# Patient Record
Sex: Female | Born: 1950
Health system: Southern US, Community
[De-identification: ages and names within clinical notes are randomized; demographics above are authoritative.]

## PROBLEM LIST (undated history)

## (undated) DIAGNOSIS — H269 Unspecified cataract: Secondary | ICD-10-CM

## (undated) DIAGNOSIS — G709 Myoneural disorder, unspecified: Secondary | ICD-10-CM

## (undated) DIAGNOSIS — R011 Cardiac murmur, unspecified: Secondary | ICD-10-CM

## (undated) DIAGNOSIS — I10 Essential (primary) hypertension: Secondary | ICD-10-CM

## (undated) DIAGNOSIS — F419 Anxiety disorder, unspecified: Secondary | ICD-10-CM

## (undated) DIAGNOSIS — F32A Depression, unspecified: Secondary | ICD-10-CM

## (undated) DIAGNOSIS — C801 Malignant (primary) neoplasm, unspecified: Secondary | ICD-10-CM

## (undated) DIAGNOSIS — C84A0 Cutaneous T-cell lymphoma, unspecified, unspecified site: Secondary | ICD-10-CM

## (undated) DIAGNOSIS — J45909 Unspecified asthma, uncomplicated: Secondary | ICD-10-CM

## (undated) DIAGNOSIS — M1990 Unspecified osteoarthritis, unspecified site: Secondary | ICD-10-CM

## (undated) DIAGNOSIS — T7840XA Allergy, unspecified, initial encounter: Secondary | ICD-10-CM

## (undated) DIAGNOSIS — F329 Major depressive disorder, single episode, unspecified: Secondary | ICD-10-CM

## (undated) DIAGNOSIS — E785 Hyperlipidemia, unspecified: Secondary | ICD-10-CM

## (undated) DIAGNOSIS — I4891 Unspecified atrial fibrillation: Secondary | ICD-10-CM

## (undated) DIAGNOSIS — E041 Nontoxic single thyroid nodule: Secondary | ICD-10-CM

## (undated) DIAGNOSIS — N390 Urinary tract infection, site not specified: Secondary | ICD-10-CM

## (undated) DIAGNOSIS — K219 Gastro-esophageal reflux disease without esophagitis: Secondary | ICD-10-CM

## (undated) HISTORY — DX: Cutaneous T-cell lymphoma, unspecified, unspecified site: C84.A0

## (undated) HISTORY — DX: Unspecified cataract: H26.9

## (undated) HISTORY — DX: Malignant (primary) neoplasm, unspecified: C80.1

## (undated) HISTORY — DX: Allergy, unspecified, initial encounter: T78.40XA

## (undated) HISTORY — DX: Cardiac murmur, unspecified: R01.1

## (undated) HISTORY — DX: Unspecified atrial fibrillation: I48.91

## (undated) HISTORY — DX: Unspecified asthma, uncomplicated: J45.909

## (undated) HISTORY — DX: Hyperlipidemia, unspecified: E78.5

## (undated) HISTORY — PX: OTHER SURGICAL HISTORY: SHX169

## (undated) HISTORY — DX: Myoneural disorder, unspecified: G70.9

## (undated) HISTORY — DX: Gastro-esophageal reflux disease without esophagitis: K21.9

## (undated) HISTORY — DX: Essential (primary) hypertension: I10

## (undated) HISTORY — DX: Nontoxic single thyroid nodule: E04.1

## (undated) HISTORY — DX: Major depressive disorder, single episode, unspecified: F32.9

## (undated) HISTORY — DX: Unspecified osteoarthritis, unspecified site: M19.90

## (undated) HISTORY — DX: Depression, unspecified: F32.A

## (undated) HISTORY — DX: Urinary tract infection, site not specified: N39.0

## (undated) HISTORY — DX: Anxiety disorder, unspecified: F41.9

---

## 1977-06-07 HISTORY — PX: OTHER SURGICAL HISTORY: SHX169

## 2009-06-07 DIAGNOSIS — C84A0 Cutaneous T-cell lymphoma, unspecified, unspecified site: Secondary | ICD-10-CM

## 2009-06-07 HISTORY — DX: Cutaneous T-cell lymphoma, unspecified, unspecified site: C84.A0

## 2013-06-07 HISTORY — PX: CHOLECYSTECTOMY: SHX55

## 2014-05-21 ENCOUNTER — Ambulatory Visit (INDEPENDENT_AMBULATORY_CARE_PROVIDER_SITE_OTHER): Payer: Medicare Other | Admitting: Family

## 2014-05-21 ENCOUNTER — Ambulatory Visit (INDEPENDENT_AMBULATORY_CARE_PROVIDER_SITE_OTHER): Payer: Medicare Other

## 2014-05-21 ENCOUNTER — Encounter: Payer: Self-pay | Admitting: Family

## 2014-05-21 VITALS — BP 150/98 | HR 61 | Temp 97.8°F | Resp 18 | Ht 67.0 in | Wt 178.8 lb

## 2014-05-21 DIAGNOSIS — F418 Other specified anxiety disorders: Secondary | ICD-10-CM

## 2014-05-21 DIAGNOSIS — J45909 Unspecified asthma, uncomplicated: Secondary | ICD-10-CM | POA: Insufficient documentation

## 2014-05-21 DIAGNOSIS — Z23 Encounter for immunization: Secondary | ICD-10-CM

## 2014-05-21 DIAGNOSIS — J4520 Mild intermittent asthma, uncomplicated: Secondary | ICD-10-CM

## 2014-05-21 DIAGNOSIS — I10 Essential (primary) hypertension: Secondary | ICD-10-CM

## 2014-05-21 DIAGNOSIS — F329 Major depressive disorder, single episode, unspecified: Secondary | ICD-10-CM | POA: Insufficient documentation

## 2014-05-21 DIAGNOSIS — F419 Anxiety disorder, unspecified: Secondary | ICD-10-CM

## 2014-05-21 MED ORDER — LISINOPRIL 20 MG PO TABS: 20.0000 mg | ORAL_TABLET | Freq: Every day | ORAL | Status: DC

## 2014-05-21 MED ORDER — ALBUTEROL SULFATE (5 MG/ML) 0.5% IN NEBU: 2.5000 mg | INHALATION_SOLUTION | Freq: Four times a day (QID) | RESPIRATORY_TRACT | Status: DC | PRN

## 2014-05-21 MED ORDER — ALPRAZOLAM 0.25 MG PO TABS: 0.2500 mg | ORAL_TABLET | Freq: Every day | ORAL | Status: DC | PRN

## 2014-05-21 MED ORDER — MONTELUKAST SODIUM 10 MG PO TABS: 10.0000 mg | ORAL_TABLET | Freq: Every day | ORAL | Status: DC

## 2014-05-21 MED ORDER — CITALOPRAM HYDROBROMIDE 10 MG PO TABS: 10.0000 mg | ORAL_TABLET | Freq: Every day | ORAL | Status: DC

## 2014-05-21 NOTE — Progress Notes (Signed)
Subjective:    Patient ID: Glenda Bauer, female    DOB: July 28, 1950, 63 y.o.   MRN: [id number]  Chief Complaint  Patient presents with  . Establish Care    Just moved here, no issues, just needs to get a new provider and have someone refill meds    HPI:  Glenda Bauer is a 63 y.o. female who presents today to establish care.  1) Hypertension - Currently maintained on lisinopril. Previous readings have been in the 130s-140s / 80s. Denies any chest pain/discomfort, shortness of breath, edema, or dry cough. Last eye exam about 3 years ago - due for an exam.   2) Anxiety/depression - currently maintained on citalopram and xanax. Indicates the depression is bad since stopping taking the citalopram about a month ago. Denies any suicidal or homicidal ideations. Has attempted to use exercise to replace which has helped very little.   3) Asthma - Currently maintained on montelukast, beclomethasone, and albuterol. Indicates she uses her albuterol 1 x per week and is not currently having any night symptoms.   No Known Allergies  Current Outpatient Prescriptions  Medication Sig Dispense Refill  . albuterol (PROVENTIL) (5 MG/ML) 0.5% nebulizer solution Take 0.5 mLs (2.5 mg total) by nebulization every 6 (six) hours as needed for wheezing or shortness of breath. 20 mL 0  . ALPRAZolam (XANAX) 0.25 MG tablet Take 1-2 tablets (0.25-0.5 mg total) by mouth daily as needed for anxiety. 20 tablet 0  . citalopram (CELEXA) 10 MG tablet Take 1 tablet (10 mg total) by mouth daily. 30 tablet 0  . lisinopril (PRINIVIL,ZESTRIL) 20 MG tablet Take 1 tablet (20 mg total) by mouth daily. 30 tablet 0  . montelukast (SINGULAIR) 10 MG tablet Take 1 tablet (10 mg total) by mouth at bedtime. 30 tablet 0  . triamcinolone (NASACORT ALLERGY 24HR) 55 MCG/ACT AERO nasal inhaler Place 2 sprays into the nose daily.     No current facility-administered medications for this visit.    Past Medical History    Diagnosis Date  . Asthma   . Cancer     Melanoma and Mycosis Fungosis  . Depression   . Heart murmur   . Anxiety   . Allergy   . Hypertension   . Urinary tract infection     Family History  Problem Relation Age of Onset  . Arthritis Mother   . Ovarian cancer Mother   . Breast cancer Mother    History   Social History  . Marital Status: Married    Spouse Name: N/A    Number of Children: 3  . Years of Education: 16   Occupational History  . Clerical     Social History Main Topics  . Smoking status: Former Smoker -- 0.25 packs/day for 10 years    Types: Cigarettes  . Smokeless tobacco: Never Used  . Alcohol Use: No  . Drug Use: No  . Sexual Activity: Not on file   Other Topics Concern  . Not on file   Social History Narrative   Born and raised in [place], [place]. Currently resides in apartment with her husband and son. 2 dogs. Fun: hiking, reading.   Denies religious beliefs that would effect health care.     Review of Systems    See HPI  Objective:    BP 150/98 mmHg  Pulse 61  Temp(Src) 97.8 F (36.6 C) (Oral)  Resp 18  Ht 5\' 7"  (1.702 m)  Wt 178 lb 12.8 oz (81.103 kg)  BMI 28.00 kg/m2  SpO2 98% Nursing note and vital signs reviewed.  Physical Exam  Constitutional: She is oriented to person, place, and time. She appears well-developed and well-nourished. No distress.  Cardiovascular: Normal rate, regular rhythm, normal heart sounds and intact distal pulses.   Pulmonary/Chest: Effort normal and breath sounds normal.  Neurological: She is alert and oriented to person, place, and time.  Skin: Skin is warm and dry.  Psychiatric: She has a normal mood and affect. Her behavior is normal. Judgment and thought content normal.       Assessment & Plan:

## 2014-05-21 NOTE — Progress Notes (Signed)
Pre visit review using our clinic review tool, if applicable. No additional management support is needed unless otherwise documented below in the visit note. 

## 2014-05-21 NOTE — Patient Instructions (Signed)
Thank you for choosing [facility].  Summary/Instructions:  Your prescription(s) have been submitted to your pharmacy. Please take as directed and contact our office if you believe you are having problem(s) with the medication(s).

## 2014-05-21 NOTE — Assessment & Plan Note (Signed)
Previously maintained on citalopram and Xanax. She ran out of the citalopram and tapered herself down. Since that time she has experienced increased amounts of depression. Restart citalopram and continue Xanax. Denies any suicidal or homicidal ideations. Follow up in approximately one month or sooner for physical.

## 2014-05-21 NOTE — Assessment & Plan Note (Signed)
Currently maintained on albuterol nebulizer solution and montelukast. Indicates that her asthma has been better controlled since moving to [place]. Continue current montelukast and albuterol nebulizer. Medications refilled. Consider adding Qvar if needed.

## 2014-05-21 NOTE — Assessment & Plan Note (Signed)
Blood pressure is slightly elevated today. Given home readings, maintain current lisinopril at this time. She is due for an eye exam and will schedule. Blood work to be completed during physical.

## 2014-05-22 ENCOUNTER — Telehealth: Payer: Self-pay | Admitting: Family

## 2014-05-22 NOTE — Telephone Encounter (Signed)
emmi mailed  °

## 2014-06-17 ENCOUNTER — Other Ambulatory Visit: Payer: Self-pay | Admitting: Family

## 2014-06-25 ENCOUNTER — Other Ambulatory Visit: Payer: Self-pay

## 2014-06-25 MED ORDER — CITALOPRAM HYDROBROMIDE 10 MG PO TABS: 10.0000 mg | ORAL_TABLET | Freq: Every day | ORAL | Status: DC

## 2014-07-05 ENCOUNTER — Telehealth: Payer: Self-pay | Admitting: Family

## 2014-07-05 MED ORDER — ALBUTEROL SULFATE HFA 108 (90 BASE) MCG/ACT IN AERS: 1.0000 | INHALATION_SPRAY | Freq: Four times a day (QID) | RESPIRATORY_TRACT | Status: DC | PRN

## 2014-07-05 MED ORDER — SALMETEROL XINAFOATE 50 MCG/DOSE IN AEPB: 1.0000 | INHALATION_SPRAY | Freq: Two times a day (BID) | RESPIRATORY_TRACT | Status: DC

## 2014-07-05 NOTE — Telephone Encounter (Signed)
Pt call back she stated second inhaler should have been serevent inhaler. Sent to [facility]...Johny Chess

## 2014-07-05 NOTE — Telephone Encounter (Signed)
Pt called in and wanted to see if Marya Amsler could call in these 2 inhalers?   [place] inhaler Albuterol inhaler   [facility]

## 2014-07-05 NOTE — Telephone Encounter (Signed)
Sent albuterol inhaler. Called pt to clarify second inhaler not on pt med list.../lmb

## 2014-07-23 ENCOUNTER — Other Ambulatory Visit: Payer: Self-pay | Admitting: Family

## 2014-07-30 ENCOUNTER — Other Ambulatory Visit: Payer: Self-pay

## 2014-07-30 MED ORDER — CITALOPRAM HYDROBROMIDE 10 MG PO TABS: 10.0000 mg | ORAL_TABLET | Freq: Every day | ORAL | Status: DC

## 2014-08-06 ENCOUNTER — Encounter: Payer: Self-pay | Admitting: Family

## 2014-08-06 ENCOUNTER — Ambulatory Visit (INDEPENDENT_AMBULATORY_CARE_PROVIDER_SITE_OTHER): Payer: Medicare Other | Admitting: Family

## 2014-08-06 VITALS — BP 130/90 | HR 74 | Temp 97.6°F | Resp 18 | Ht 67.0 in | Wt 179.8 lb

## 2014-08-06 DIAGNOSIS — J209 Acute bronchitis, unspecified: Secondary | ICD-10-CM

## 2014-08-06 MED ORDER — PREDNISONE 20 MG PO TABS
20.0000 mg | ORAL_TABLET | Freq: Every day | ORAL | Status: DC
Start: 2014-08-06 — End: 2014-11-14

## 2014-08-06 MED ORDER — ALBUTEROL SULFATE (5 MG/ML) 0.5% IN NEBU: 2.5000 mg | INHALATION_SOLUTION | Freq: Four times a day (QID) | RESPIRATORY_TRACT | Status: DC | PRN

## 2014-08-06 MED ORDER — CEFUROXIME AXETIL 250 MG PO TABS: 250.0000 mg | ORAL_TABLET | Freq: Two times a day (BID) | ORAL | Status: DC

## 2014-08-06 NOTE — Progress Notes (Signed)
   Subjective:    Patient ID: Glenda Bauer, female    DOB: 1950/11/19, 64 y.o.   MRN: [id number]  Chief Complaint  Patient presents with  . Cough    x4 weeks, wheezing, semi productive cough, SOB, uses nebulizer daily, temporarily helps, and nasal congestion and drainage    HPI:  Glenda Bauer is a 64 y.o. female who presents today for an acute visit.  This is a new problem. Associated symptoms of wheezing, sem-iproductive cough, SOB, and nasal drainage and congestion that has been going on for 4 weeks. Indicates the symptoms have waxed and waned over the course of the time. Has used mucinex-DM, serovent, and nebulizer which has helped temporarily. Denies any recent antibiotic use.    No Known Allergies   Current Outpatient Prescriptions on File Prior to Visit  Medication Sig Dispense Refill  . albuterol (PROVENTIL HFA;VENTOLIN HFA) 108 (90 BASE) MCG/ACT inhaler Inhale 1-2 puffs into the lungs every 6 (six) hours as needed for wheezing or shortness of breath. 18 g 0  . albuterol (PROVENTIL) (5 MG/ML) 0.5% nebulizer solution Take 0.5 mLs (2.5 mg total) by nebulization every 6 (six) hours as needed for wheezing or shortness of breath. 20 mL 0  . ALPRAZolam (XANAX) 0.25 MG tablet Take 1-2 tablets (0.25-0.5 mg total) by mouth daily as needed for anxiety. 20 tablet 0  . citalopram (CELEXA) 10 MG tablet Take 1 tablet (10 mg total) by mouth daily. 30 tablet 0  . lisinopril (PRINIVIL,ZESTRIL) 20 MG tablet Take 1 tablet (20 mg total) by mouth daily. 30 tablet 0  . montelukast (SINGULAIR) 10 MG tablet Take 1 tablet (10 mg total) by mouth at bedtime. 30 tablet 0  . salmeterol (SEREVENT) 50 MCG/DOSE diskus inhaler Inhale 1 puff into the lungs 2 (two) times daily. 1 Inhaler 2  . triamcinolone (NASACORT ALLERGY 24HR) 55 MCG/ACT AERO nasal inhaler Place 2 sprays into the nose daily.     No current facility-administered medications on file prior to visit.    Review of Systems    Constitutional: Positive for fatigue. Negative for fever and chills.  HENT: Positive for congestion.   Respiratory: Positive for cough and wheezing.       Objective:    BP 130/90 mmHg  Pulse 74  Temp(Src) 97.6 F (36.4 C) (Oral)  Resp 18  Ht 5\' 7"  (1.702 m)  Wt 179 lb 12.8 oz (81.557 kg)  BMI 28.15 kg/m2  SpO2 96% Nursing note and vital signs reviewed.  Physical Exam  Constitutional: She is oriented to person, place, and time. She appears well-developed and well-nourished. No distress.  HENT:  Right Ear: Hearing, external ear and ear canal normal.  Left Ear: Hearing, tympanic membrane, external ear and ear canal normal.  Nose: Nose normal. Right sinus exhibits no maxillary sinus tenderness and no frontal sinus tenderness. Left sinus exhibits no maxillary sinus tenderness and no frontal sinus tenderness.  Mouth/Throat: Uvula is midline, oropharynx is clear and moist and mucous membranes are normal.  Cardiovascular: Normal rate, regular rhythm, normal heart sounds and intact distal pulses.   Pulmonary/Chest: Effort normal. She has wheezes.  Neurological: She is alert and oriented to person, place, and time.  Skin: Skin is warm and dry.  Psychiatric: She has a normal mood and affect. Her behavior is normal. Judgment and thought content normal.       Assessment & Plan:

## 2014-08-06 NOTE — Progress Notes (Signed)
Pre visit review using our clinic review tool, if applicable. No additional management support is needed unless otherwise documented below in the visit note. 

## 2014-08-06 NOTE — Patient Instructions (Signed)
Thank you for choosing [facility].  Summary/Instructions:   Call and ask for Lovena Le - please let us know what is on the pharmacy formulary.  Your prescription(s) have been submitted to your pharmacy or been printed and provided for you. Please take as directed and contact our office if you believe you are having problem(s) with the medication(s) or have any questions.  If your symptoms worsen or fail to improve, please contact our office for further instruction, or in case of emergency go directly to the emergency room at the closest medical facility.    Acute Bronchitis Bronchitis is inflammation of the airways that extend from the windpipe into the lungs (bronchi). The inflammation often causes mucus to develop. This leads to a cough, which is the most common symptom of bronchitis.  In acute bronchitis, the condition usually develops suddenly and goes away over time, usually in a couple weeks. Smoking, allergies, and asthma can make bronchitis worse. Repeated episodes of bronchitis may cause further lung problems.  CAUSES Acute bronchitis is most often caused by the same virus that causes a cold. The virus can spread from person to person (contagious) through coughing, sneezing, and touching contaminated objects. SIGNS AND SYMPTOMS   Cough.   Fever.   Coughing up mucus.   Body aches.   Chest congestion.   Chills.   Shortness of breath.   Sore throat.  DIAGNOSIS  Acute bronchitis is usually diagnosed through a physical exam. Your health care provider will also ask you questions about your medical history. Tests, such as chest X-rays, are sometimes done to rule out other conditions.  TREATMENT  Acute bronchitis usually goes away in a couple weeks. Oftentimes, no medical treatment is necessary. Medicines are sometimes given for relief of fever or cough. Antibiotic medicines are usually not needed but may be prescribed in certain situations. In some cases, an  inhaler may be recommended to help reduce shortness of breath and control the cough. A cool mist vaporizer may also be used to help thin bronchial secretions and make it easier to clear the chest.  HOME CARE INSTRUCTIONS  Get plenty of rest.   Drink enough fluids to keep your urine clear or pale yellow (unless you have a medical condition that requires fluid restriction). Increasing fluids may help thin your respiratory secretions (sputum) and reduce chest congestion, and it will prevent dehydration.   Take medicines only as directed by your health care provider.  If you were prescribed an antibiotic medicine, finish it all even if you start to feel better.  Avoid smoking and secondhand smoke. Exposure to cigarette smoke or irritating chemicals will make bronchitis worse. If you are a smoker, consider using nicotine gum or skin patches to help control withdrawal symptoms. Quitting smoking will help your lungs heal faster.   Reduce the chances of another bout of acute bronchitis by washing your hands frequently, avoiding people with cold symptoms, and trying not to touch your hands to your mouth, nose, or eyes.   Keep all follow-up visits as directed by your health care provider.  SEEK MEDICAL CARE IF: Your symptoms do not improve after 1 week of treatment.  SEEK IMMEDIATE MEDICAL CARE IF:  You develop an increased fever or chills.   You have chest pain.   You have severe shortness of breath.  You have bloody sputum.   You develop dehydration.  You faint or repeatedly feel like you are going to pass out.  You develop repeated vomiting.  You develop  a severe headache. MAKE SURE YOU:   Understand these instructions.  Will watch your condition.  Will get help right away if you are not doing well or get worse. Document Released: 07/01/2004 Document Revised: 10/08/2013 Document Reviewed: 11/14/2012 [facility] Patient Information 2015 [place], [place]. This information is not  intended to replace advice given to you by your health care provider. Make sure you discuss any questions you have with your health care provider.

## 2014-08-06 NOTE — Assessment & Plan Note (Signed)
Symptoms and exam consistent with acute asthmatic bronchitis. Start Ceftin. Start prednisone. Continue OTC medication as needed and albuterol as needed for shortness of breath. Follow up if symptoms worsen or fail to improve.

## 2014-08-07 ENCOUNTER — Telehealth: Payer: Self-pay

## 2014-08-07 MED ORDER — FLUTICASONE-SALMETEROL 100-50 MCG/DOSE IN AEPB: 1.0000 | INHALATION_SPRAY | Freq: Two times a day (BID) | RESPIRATORY_TRACT | Status: DC

## 2014-08-07 NOTE — Telephone Encounter (Signed)
LVM making pt aware

## 2014-08-07 NOTE — Telephone Encounter (Signed)
Pt called stating that you had asked her to call back with the names of inhalers. She named Bronchodilator Anticholinergic, Advair Diskus, Advair HFA, and Symbicort. Pt would like to know which one you prefer for her to be sent in. Please advise.

## 2014-08-07 NOTE — Telephone Encounter (Signed)
Please inform patient I have sent a prescription for Advair Diskus for her.

## 2014-08-28 ENCOUNTER — Other Ambulatory Visit: Payer: Self-pay | Admitting: Family

## 2014-09-17 ENCOUNTER — Other Ambulatory Visit: Payer: Self-pay | Admitting: Family

## 2014-11-11 ENCOUNTER — Other Ambulatory Visit: Payer: Self-pay | Admitting: Family

## 2014-11-11 ENCOUNTER — Telehealth: Payer: Self-pay | Admitting: Family

## 2014-11-11 DIAGNOSIS — Z0000 Encounter for general adult medical examination without abnormal findings: Secondary | ICD-10-CM

## 2014-11-11 NOTE — Telephone Encounter (Signed)
Patient did not get her blood work done in march because of illness. She is coming for her AWV on 11/14/2014. Would it be ok to put CPE labs in for her to have done on 11/14/2014 when she comes in? Let me know and I'll call her back to advise

## 2014-11-11 NOTE — Telephone Encounter (Signed)
Lab orders are placed.

## 2014-11-13 NOTE — Telephone Encounter (Signed)
Called and advised.

## 2014-11-14 ENCOUNTER — Other Ambulatory Visit (INDEPENDENT_AMBULATORY_CARE_PROVIDER_SITE_OTHER): Payer: Medicare Other

## 2014-11-14 ENCOUNTER — Telehealth: Payer: Self-pay

## 2014-11-14 ENCOUNTER — Other Ambulatory Visit: Payer: Self-pay | Admitting: Family

## 2014-11-14 ENCOUNTER — Ambulatory Visit (INDEPENDENT_AMBULATORY_CARE_PROVIDER_SITE_OTHER): Payer: Medicare Other

## 2014-11-14 VITALS — BP 134/70 | Ht 67.0 in | Wt 184.8 lb

## 2014-11-14 DIAGNOSIS — I10 Essential (primary) hypertension: Secondary | ICD-10-CM

## 2014-11-14 DIAGNOSIS — Z1231 Encounter for screening mammogram for malignant neoplasm of breast: Secondary | ICD-10-CM | POA: Diagnosis not present

## 2014-11-14 DIAGNOSIS — Z0000 Encounter for general adult medical examination without abnormal findings: Secondary | ICD-10-CM

## 2014-11-14 DIAGNOSIS — Z780 Asymptomatic menopausal state: Secondary | ICD-10-CM | POA: Diagnosis not present

## 2014-11-14 LAB — COMPREHENSIVE METABOLIC PANEL
ALT: 30 U/L (ref 0–35)
AST: 21 U/L (ref 0–37)
Albumin: 4.1 g/dL (ref 3.5–5.2)
Alkaline Phosphatase: 60 U/L (ref 39–117)
BILIRUBIN TOTAL: 0.5 mg/dL (ref 0.2–1.2)
BUN: 21 mg/dL (ref 6–23)
CO2: 26 mEq/L (ref 19–32)
CREATININE: 0.75 mg/dL (ref 0.40–1.20)
Calcium: 9.3 mg/dL (ref 8.4–10.5)
Chloride: 108 mEq/L (ref 96–112)
GFR: 82.22 mL/min (ref >60.00)
Glucose, Bld: 98 mg/dL (ref 70–99)
Potassium: 4.4 mEq/L (ref 3.5–5.1)
Sodium: 140 mEq/L (ref 135–145)
Total Protein: 7.1 g/dL (ref 6.0–8.3)

## 2014-11-14 LAB — LIPID PANEL
CHOL/HDL RATIO: 3
CHOLESTEROL: 179 mg/dL (ref 0–200)
HDL: 68.1 mg/dL (ref >39.00)
LDL Cholesterol: 99 mg/dL (ref 0–99)
NonHDL: 110.9
Triglycerides: 61 mg/dL (ref 0.0–149.0)
VLDL: 12.2 mg/dL (ref 0.0–40.0)

## 2014-11-14 LAB — BASIC METABOLIC PANEL
BUN: 21 mg/dL (ref 6–23)
CHLORIDE: 108 meq/L (ref 96–112)
CO2: 26 mEq/L (ref 19–32)
Calcium: 9.3 mg/dL (ref 8.4–10.5)
Creatinine, Ser: 0.75 mg/dL (ref 0.40–1.20)
GFR: 82.22 mL/min (ref >60.00)
GLUCOSE: 98 mg/dL (ref 70–99)
Potassium: 4.4 mEq/L (ref 3.5–5.1)
Sodium: 140 mEq/L (ref 135–145)

## 2014-11-14 LAB — TSH: TSH: 0.89 u[IU]/mL (ref 0.35–4.50)

## 2014-11-14 MED ORDER — ALPRAZOLAM 0.25 MG PO TABS: 0.2500 mg | ORAL_TABLET | Freq: Every day | ORAL | Status: DC | PRN

## 2014-11-14 MED ORDER — ALBUTEROL SULFATE HFA 108 (90 BASE) MCG/ACT IN AERS: INHALATION_SPRAY | RESPIRATORY_TRACT | Status: DC

## 2014-11-14 NOTE — Telephone Encounter (Signed)
Call placed to [facility] pharmacy; [phone] (Phone) to question why [facility] can not get rx for albuterol HFA/ The patient was in for AWV and stated she had rec'd a letter that she cannot get another refill of this from the pharmacy as her insurance does not cover it.  Stated to refill and they would submit and if denied, will then notify md of PA or other

## 2014-11-14 NOTE — Telephone Encounter (Signed)
Inhaler resent. Will do PA if necessary

## 2014-11-14 NOTE — Patient Instructions (Addendum)
Health Maintenance Adopting a healthy lifestyle and getting preventive care can go a long way to promote health and wellness. Talk with your health care provider about what schedule of regular examinations is right for you. This is a good chance for you to check in with your provider about disease prevention and staying healthy. In between checkups, there are plenty of things you can do on your own. Experts have done a lot of research about which lifestyle changes and preventive measures are most likely to keep you healthy. Ask your health care provider for more information. WEIGHT AND DIET  Eat a healthy diet  Be sure to include plenty of vegetables, fruits, low-fat dairy products, and lean protein.  Do not eat a lot of foods high in solid fats, added sugars, or salt.  Get regular exercise. This is one of the most important things you can do for your health.  Most adults should exercise for at least 150 minutes each week. The exercise should increase your heart rate and make you sweat (moderate-intensity exercise).  Most adults should also do strengthening exercises at least twice a week. This is in addition to the moderate-intensity exercise.  Maintain a healthy weight  Body mass index (BMI) is a measurement that can be used to identify possible weight problems. It estimates body fat based on height and weight. Your health care provider can help determine your BMI and help you achieve or maintain a healthy weight.  For females 20 years of age and older:   A BMI below 18.5 is considered underweight.  A BMI of 18.5 to 24.9 is normal.  A BMI of 25 to 29.9 is considered overweight.  A BMI of 30 and above is considered obese.  Watch levels of cholesterol and blood lipids  You should start having your blood tested for lipids and cholesterol at 66 years of age, then have this test every 5 years.  You may need to have your cholesterol levels checked more often if:  Your lipid or  cholesterol levels are high.  You are older than 66 years of age.  You are at high risk for heart disease.  CANCER SCREENING   Lung Cancer  Lung cancer screening is recommended for adults 55-80 years old who are at high risk for lung cancer because of a history of smoking.  A yearly low-dose CT scan of the lungs is recommended for people who:  Currently smoke.  Have quit within the past 15 years.  Have at least a 30-pack-year history of smoking. A pack year is smoking an average of one pack of cigarettes a day for 1 year.  Yearly screening should continue until it has been 15 years since you quit.  Yearly screening should stop if you develop a health problem that would prevent you from having lung cancer treatment.  Breast Cancer  Practice breast self-awareness. This means understanding how your breasts normally appear and feel.  It also means doing regular breast self-exams. Let your health care provider know about any changes, no matter how small.  If you are in your 20s or 30s, you should have a clinical breast exam (CBE) by a health care provider every 1-3 years as part of a regular health exam.  If you are 40 or older, have a CBE every year. Also consider having a breast X-ray (mammogram) every year.  If you have a family history of breast cancer, talk to your health care provider about genetic screening.  If you are   at high risk for breast cancer, talk to your health care provider about having an MRI and a mammogram every year.  Breast cancer gene (BRCA) assessment is recommended for women who have family members with BRCA-related cancers. BRCA-related cancers include:  Breast.  Ovarian.  Tubal.  Peritoneal cancers.  Results of the assessment will determine the need for genetic counseling and BRCA1 and BRCA2 testing. Cervical Cancer Routine pelvic examinations to screen for cervical cancer are no longer recommended for nonpregnant women who are considered low  risk for cancer of the pelvic organs (ovaries, uterus, and vagina) and who do not have symptoms. A pelvic examination may be necessary if you have symptoms including those associated with pelvic infections. Ask your health care provider if a screening pelvic exam is right for you.   The Pap test is the screening test for cervical cancer for women who are considered at risk.  If you had a hysterectomy for a problem that was not cancer or a condition that could lead to cancer, then you no longer need Pap tests.  If you are older than 65 years, and you have had normal Pap tests for the past 10 years, you no longer need to have Pap tests.  If you have had past treatment for cervical cancer or a condition that could lead to cancer, you need Pap tests and screening for cancer for at least 20 years after your treatment.  If you no longer get a Pap test, assess your risk factors if they change (such as having a new sexual partner). This can affect whether you should start being screened again.  Some women have medical problems that increase their chance of getting cervical cancer. If this is the case for you, your health care provider may recommend more frequent screening and Pap tests.  The human papillomavirus (HPV) test is another test that may be used for cervical cancer screening. The HPV test looks for the virus that can cause cell changes in the cervix. The cells collected during the Pap test can be tested for HPV.  The HPV test can be used to screen women 30 years of age and older. Getting tested for HPV can extend the interval between normal Pap tests from three to five years.  An HPV test also should be used to screen women of any age who have unclear Pap test results.  After 66 years of age, women should have HPV testing as often as Pap tests.  Colorectal Cancer  This type of cancer can be detected and often prevented.  Routine colorectal cancer screening usually begins at 66 years of  age and continues through 66 years of age.  Your health care provider may recommend screening at an earlier age if you have risk factors for colon cancer.  Your health care provider may also recommend using home test kits to check for hidden blood in the stool.  A small camera at the end of a tube can be used to examine your colon directly (sigmoidoscopy or colonoscopy). This is done to check for the earliest forms of colorectal cancer.  Routine screening usually begins at age 50.  Direct examination of the colon should be repeated every 5-10 years through 66 years of age. However, you may need to be screened more often if early forms of precancerous polyps or small growths are found. Skin Cancer  Check your skin from head to toe regularly.  Tell your health care provider about any new moles or changes in   moles, especially if there is a change in a mole's shape or color.  Also tell your health care provider if you have a mole that is larger than the size of a pencil eraser.  Always use sunscreen. Apply sunscreen liberally and repeatedly throughout the day.  Protect yourself by wearing long sleeves, pants, a wide-brimmed hat, and sunglasses whenever you are outside. HEART DISEASE, DIABETES, AND HIGH BLOOD PRESSURE   Have your blood pressure checked at least every 1-2 years. High blood pressure causes heart disease and increases the risk of stroke.  If you are between 55 years and 79 years old, ask your health care provider if you should take aspirin to prevent strokes.  Have regular diabetes screenings. This involves taking a blood sample to check your fasting blood sugar level.  If you are at a normal weight and have a low risk for diabetes, have this test once every three years after 66 years of age.  If you are overweight and have a high risk for diabetes, consider being tested at a younger age or more often. PREVENTING INFECTION  Hepatitis B  If you have a higher risk for  hepatitis B, you should be screened for this virus. You are considered at high risk for hepatitis B if:  You were born in a country where hepatitis B is common. Ask your health care provider which countries are considered high risk.  Your parents were born in a high-risk country, and you have not been immunized against hepatitis B (hepatitis B vaccine).  You have HIV or AIDS.  You use needles to inject street drugs.  You live with someone who has hepatitis B.  You have had sex with someone who has hepatitis B.  You get hemodialysis treatment.  You take certain medicines for conditions, including cancer, organ transplantation, and autoimmune conditions. Hepatitis C  Blood testing is recommended for:  Everyone born from 1945 through 1965.  Anyone with known risk factors for hepatitis C. Sexually transmitted infections (STIs)  You should be screened for sexually transmitted infections (STIs) including gonorrhea and chlamydia if:  You are sexually active and are younger than 66 years of age.  You are older than 66 years of age and your health care provider tells you that you are at risk for this type of infection.  Your sexual activity has changed since you were last screened and you are at an increased risk for chlamydia or gonorrhea. Ask your health care provider if you are at risk.  If you do not have HIV, but are at risk, it may be recommended that you take a prescription medicine daily to prevent HIV infection. This is called pre-exposure prophylaxis (PrEP). You are considered at risk if:  You are sexually active and do not regularly use condoms or know the HIV status of your partner(s).  You take drugs by injection.  You are sexually active with a partner who has HIV. Talk with your health care provider about whether you are at high risk of being infected with HIV. If you choose to begin PrEP, you should first be tested for HIV. You should then be tested every 3 months for  as long as you are taking PrEP.  PREGNANCY   If you are premenopausal and you may become pregnant, ask your health care provider about preconception counseling.  If you may become pregnant, take 400 to 800 micrograms (mcg) of folic acid every day.  If you want to prevent pregnancy, talk to your   to your health care provider about birth control (contraception). OSTEOPOROSIS AND MENOPAUSE   Osteoporosis is a disease in which the bones lose minerals and strength with aging. This can result in serious bone fractures. Your risk for osteoporosis can be identified using a bone density scan.  If you are 55 years of age or older, or if you are at risk for osteoporosis and fractures, ask your health care provider if you should be screened.  Ask your health care provider whether you should take a calcium or vitamin D supplement to lower your risk for osteoporosis.  Menopause may have certain physical symptoms and risks.  Hormone replacement therapy may reduce some of these symptoms and risks. Talk to your health care provider about whether hormone replacement therapy is right for you.  HOME CARE INSTRUCTIONS   Schedule regular health, dental, and eye exams.  Stay current with your immunizations.   Do not use any tobacco products including cigarettes, chewing tobacco, or electronic cigarettes.  If you are pregnant, do not drink alcohol.  If you are breastfeeding, limit how much and how often you drink alcohol.  Limit alcohol intake to no more than 1 drink per day for nonpregnant women. One drink equals 12 ounces of beer, 5 ounces of wine, or 1 ounces of hard liquor.  Do not use street drugs.  Do not share needles.  Ask your health care provider for help if you need support or information about quitting drugs.  Tell your health care provider if you often feel depressed.  Tell your health care provider if you have ever been abused or do not feel safe at home. Document Released: 12/07/2010  Document Revised: 10/08/2013 Document Reviewed: 04/25/2013 [facility] Patient Information 2015 [place], [place]. This information is not intended to replace advice given to you by your health care provider. Make sure you discuss any questions you have with your health care provider.   Glenda Bauer , Thank you for taking time to come for your Medicare Wellness Visit. I appreciate your ongoing commitment to your health goals. Please review the following plan we discussed and let me know if I can assist you in the future.  1. Will see Terri Piedra for fup to dx and meds /bp 2. Will have dexa and mammogram 3. Labs todayCardiac Diet This diet can help prevent heart disease and stroke. Many factors influence your heart health, including eating and exercise habits. Coronary risk rises a lot with abnormal blood fat (lipid) levels. Cardiac meal planning includes limiting unhealthy fats, increasing healthy fats, and making other small dietary changes. General guidelines are as follows:  Adjust calorie intake to reach and maintain desirable body weight.  Limit total fat intake to less than 30% of total calories. Saturated fat should be less than 7% of calories.  Saturated fats are found in animal products and in some vegetable products. Saturated vegetable fats are found in coconut oil, cocoa butter, palm oil, and palm kernel oil. Read labels carefully to avoid these products as much as possible. Use butter in moderation. Choose tub margarines and oils that have 2 grams of fat or less. Good cooking oils are canola and olive oils.  Practice low-fat cooking techniques. Do not fry food. Instead, broil, bake, boil, steam, grill, roast on a rack, stir-fry, or microwave it. Other fat reducing suggestions include:  Remove the skin from poultry.  Remove all visible fat from meats.  Skim the fat off stews, soups, and gravies before serving them.  Steam vegetables  in water or broth instead of sauting them in  fat.  Avoid foods with trans fat (or hydrogenated oils), such as commercially fried foods and commercially baked goods. Commercial shortening and deep-frying fats will contain trans fat.  Increase intake of fruits, vegetables, whole grains, and legumes to replace foods high in fat.  Increase consumption of nuts, legumes, and seeds to at least 4 servings weekly. One serving of a legume equals  cup, and 1 serving of nuts or seeds equals  cup.  Choose whole grains more often. Have 3 servings per day (a serving is 1 ounce [oz]).  Eat 4 to 5 servings of vegetables per day. A serving of vegetables is 1 cup of raw leafy vegetables;  cup of raw or cooked cut-up vegetables;  cup of vegetable juice.  Eat 4 to 5 servings of fruit per day. A serving of fruit is 1 medium whole fruit;  cup of dried fruit;  cup of fresh, frozen, or canned fruit;  cup of 100% fruit juice.  Increase your intake of dietary fiber to 20 to 30 grams per day. Insoluble fiber may help lower your risk of heart disease and may help curb your appetite.  Soluble fiber binds cholesterol to be removed from the blood. Foods high in soluble fiber are dried beans, citrus fruits, oats, apples, bananas, broccoli, Brussels sprouts, and eggplant.  Try to include foods fortified with plant sterols or stanols, such as yogurt, breads, juices, or margarines. Choose several fortified foods to achieve a daily intake of 2 to 3 grams of plant sterols or stanols.  Foods with omega-3 fats can help reduce your risk of heart disease. Aim to have a 3.5 oz portion of fatty fish twice per week, such as salmon, mackerel, albacore tuna, sardines, lake trout, or herring. If you wish to take a fish oil supplement, choose one that contains 1 gram of both DHA and EPA.  Limit processed meats to 2 servings (3 oz portion) weekly.  Limit the sodium in your diet to 1500 milligrams (mg) per day. If you have high blood pressure, talk to a registered dietitian about  a DASH (Dietary Approaches to Stop Hypertension) eating plan.  Limit sweets and beverages with added sugar, such as soda, to no more than 5 servings per week. One serving is:   1 tablespoon sugar.  1 tablespoon jelly or jam.   cup sorbet.  1 cup lemonade.   cup regular soda. CHOOSING FOODS Starches  Allowed: Breads: All kinds (wheat, rye, raisin, white, oatmeal, [place], [place], and English muffin bread). Low-fat rolls: English muffins, frankfurter and hamburger buns, bagels, pita bread, tortillas (not fried). Pancakes, waffles, biscuits, and muffins made with recommended oil.  Avoid: Products made with saturated or trans fats, oils, or whole milk products. Butter rolls, cheese breads, croissants. Commercial doughnuts, muffins, sweet rolls, biscuits, waffles, pancakes, store-bought mixes. Crackers  Allowed: Low-fat crackers and snacks: Animal, graham, rye, saltine (with recommended oil, no lard), oyster, and matzo crackers. Bread sticks, melba toast, rusks, flatbread, pretzels, and light popcorn.  Avoid: High-fat crackers: cheese crackers, butter crackers, and those made with coconut, palm oil, or trans fat (hydrogenated oils). Buttered popcorn. Cereals  Allowed: Hot or cold whole-grain cereals.  Avoid: Cereals containing coconut, hydrogenated vegetable fat, or animal fat. Potatoes / Pasta / Rice  Allowed: All kinds of potatoes, rice, and pasta (such as macaroni, spaghetti, and noodles).  Avoid: Pasta or rice prepared with cream sauce or high-fat cheese. Chow mein noodles, [place] fries. Vegetables  Allowed: All vegetables and vegetable juices.  Avoid: Fried vegetables. Vegetables in cream, butter, or high-fat cheese sauces. Limit coconut. Fruit in cream or custard. Protein  Allowed: Limit your intake of meat, seafood, and poultry to no more than 6 oz (cooked weight) per day. All lean, well-trimmed beef, veal, pork, and lamb. All chicken and [place] without skin. All fish  and shellfish. Wild game: wild duck, rabbit, pheasant, and venison. Egg whites or low-cholesterol egg substitutes may be used as desired. Meatless dishes: recipes with dried beans, peas, lentils, and tofu (soybean curd). Seeds and nuts: all seeds and most nuts.  Avoid: Prime grade and other heavily marbled and fatty meats, such as short ribs, spare ribs, rib eye roast or steak, frankfurters, sausage, bacon, and high-fat luncheon meats, mutton. Caviar. Commercially fried fish. Domestic duck, goose, venison sausage. Organ meats: liver, gizzard, heart, chitterlings, brains, kidney, sweetbreads. Dairy  Allowed: Low-fat cheeses: nonfat or low-fat cottage cheese (1% or 2% fat), cheeses made with part skim milk, such as mozzarella, farmers, string, or ricotta. (Cheeses should be labeled no more than 2 to 6 grams fat per oz.). Skim (or 1%) milk: liquid, powdered, or evaporated. Buttermilk made with low-fat milk. Drinks made with skim or low-fat milk or cocoa. Chocolate milk or cocoa made with skim or low-fat (1%) milk. Nonfat or low-fat yogurt.  Avoid: Whole milk cheeses, including colby, cheddar, [place], Monterey Jack, [place], [place], [place], American, Swiss, and blue. Creamed cottage cheese, cream cheese. Whole milk and whole milk products, including buttermilk or yogurt made from whole milk, drinks made from whole milk. Condensed milk, evaporated whole milk, and 2% milk. Soups and Combination Foods  Allowed: Low-fat low-sodium soups: broth, dehydrated soups, homemade broth, soups with the fat removed, homemade cream soups made with skim or low-fat milk. Low-fat spaghetti, lasagna, chili, and Spanish rice if low-fat ingredients and low-fat cooking techniques are used.  Avoid: Cream soups made with whole milk, cream, or high-fat cheese. All other soups. Desserts and Sweets  Allowed: Sherbet, fruit ices, gelatins, meringues, and angel food cake. Homemade desserts with recommended fats, oils, and milk  products. Jam, jelly, honey, marmalade, sugars, and syrups. Pure sugar candy, such as gum drops, hard candy, jelly beans, marshmallows, mints, and small amounts of dark chocolate.  Avoid: Commercially prepared cakes, pies, cookies, frosting, pudding, or mixes for these products. Desserts containing whole milk products, chocolate, coconut, lard, palm oil, or palm kernel oil. Ice cream or ice cream drinks. Candy that contains chocolate, coconut, butter, hydrogenated fat, or unknown ingredients. Buttered syrups. Fats and Oils  Allowed: Vegetable oils: safflower, sunflower, corn, soybean, cottonseed, sesame, canola, olive, or peanut. Non-hydrogenated margarines. Salad dressing or mayonnaise: homemade or commercial, made with a recommended oil. Low or nonfat salad dressing or mayonnaise.  Limit added fats and oils to 6 to 8 tsp per day (includes fats used in cooking, baking, salads, and spreads on bread). Remember to count the "hidden fats" in foods.  Avoid: Solid fats and shortenings: butter, lard, salt pork, bacon drippings. Gravy containing meat fat, shortening, or suet. Cocoa butter, coconut. Coconut oil, palm oil, palm kernel oil, or hydrogenated oils: these ingredients are often used in bakery products, nondairy creamers, whipped toppings, candy, and commercially fried foods. Read labels carefully. Salad dressings made of unknown oils, sour cream, or cheese, such as blue cheese and Roquefort. Cream, all kinds: half-and-half, light, heavy, or whipping. Sour cream or cream cheese (even if "light" or low-fat). Nondairy cream substitutes: coffee creamers and sour cream  substitutes made with palm, palm kernel, hydrogenated oils, or coconut oil. Beverages  Allowed: Coffee (regular or decaffeinated), tea. Diet carbonated beverages, mineral water. Alcohol: Check with your caregiver. Moderation is recommended.  Avoid: Whole milk, regular sodas, and juice drinks with added sugar. Condiments  Allowed: All  seasonings and condiments. Cocoa powder. "Cream" sauces made with recommended ingredients.  Avoid: Carob powder made with hydrogenated fats. SAMPLE MENU Breakfast   cup orange juice   cup oatmeal  1 slice toast  1 tsp margarine  1 cup skim milk Lunch  [place] sandwich with 2 oz [place], 2 slices bread  Lettuce and tomato slices  Fresh fruit  Carrot sticks  Coffee or tea Snack  Fresh fruit or low-fat crackers Dinner  3 oz lean ground beef  1 baked potato  1 tsp margarine   cup asparagus  Lettuce salad  1 tbs non-creamy dressing   cup peach slices  1 cup skim milk Document Released: 03/02/2008 Document Revised: 11/23/2011 Document Reviewed: 07/24/2013 [employer] Patient Information 2015 [place], [place]. This information is not intended to replace advice given to you by your health care provider. Make sure you discuss any questions you have with your health care provider.   These are the goals we discussed: Goals    . Weight < 160 lb (72.576 kg)     Goal is to start walking again up to 5 miles;        This is a list of the screening recommended for you and due dates:  Health Maintenance  Topic Date Due  . HIV Screening  01/20/1964  . Tetanus Vaccine  01/20/1968  . Mammogram  01/20/1999  . Colon Cancer Screening  01/20/1999  . Shingles Vaccine  01/19/2009  . DEXA scan (bone density measurement)  01/19/2014  . Pneumonia vaccines (1 of 2 - PCV13) 01/19/2014  . Flu Shot  01/06/2015

## 2014-11-14 NOTE — Progress Notes (Signed)
Subjective:   Glenda Bauer is a 64 y.o. female who presents for Medicare Annual (Subsequent) preventive examination.  Review of Systems:   HRA assessment completed during visit;  Patient is here for Annual Wellness Assessment  The patient describes their health better, the same or worse than last year?  Any voiced medical complaints: needs to have Xanax refilled and rescue inhaler that insurance will cover States she has hx of cutaneous T cell lymphoma dx in 2011/ Tramcinolone Cream .01% lb jar (454g) size used prn for lymphoma rash  Scheduled apt with PCP / to fup on the above Reviewed: BMI: 28.8/  BP: little elevated; states her BP runs 150;  Other: Diet: pushing broccoli; lower fat; Not much fruit; chicken, lean beef;  Exercise: 2 miles per day and was walking 5 miles prior to getting bronchitis  Educated on metabolic syndrome;  Exercise;  (recommended 30 minutes; 5 days a week) will work on to one hour a day BMI reviewed and educated Educated regarding online nutrition programs as GumSearch.nl and http://vang.com/; fit44me;  If CV risk; Educated regarding heart healthy diet; Fat free or low fat dairy products; Fish high in omega-3 acids ( salmon, tuna, trout); Fruits, such as apples, bananas, oranges, pears, prunes,Legumes, such as kidney beans, lentils, checkpeas, black-eyed peas and lima beans; Vegetables; broccoli, cabbage, carrots; Whole grains; Plant fats; low sugar Signs and symptoms of MI; stroke and DM shared Fall risk reviewed for impaired mobility; home safety; grab bars in bathroom; stairs; small pets; lighting; Educated on climbing; orthostatic hypotension, regular vision checks; Reviewed firearms safety; smoke detectors; community safety; Driving safety and sun protection; firearm safety; smoke alarms; Psychosocial support; safe community; lives with spouse who has also retired Son lives with them for now  Discussed Goal to improve health based on  risk   Hx of Screenings; Vaccine status; had pneumonia last week [facility]; will call with type Bone density: will order Colonoscopy; Last one was 64 yo; Result was fine; no issues; repeat in 10 years Mammogram if female; last July; Due July 2016;  PAP: defer to GYN EKG: chemical stress test in about 64 yo Vision: Will need eye exam; cataract surgery x 64 yo/ referred to [facility] Hearing: [id number]  both ears Dental: needs dental work and is saving for it  Gave information on safety to take home;   Current Care Team reviewed and updated      Cardiac Risk Factors include: advanced age (>61men, >30 women)     Objective:     Vitals: Ht 5\' 7"  (1.702 m)  Wt 184 lb 12 oz (83.802 kg)  BMI 28.93 kg/m2  Tobacco History  Smoking status  . Former Smoker -- 0.25 packs/day for 10 years  . Types: Cigarettes  Smokeless tobacco  . Never Used    Comment: 35 years ago; smoke socially     Counseling given: Not Answered   Past Medical History  Diagnosis Date  . Asthma   . Cancer     Melanoma and Mycosis Fungosis  . Depression   . Heart murmur   . Anxiety   . Allergy   . Hypertension   . Urinary tract infection   . Cutaneous T-cell lymphoma 2011    Dermatologist treated in [place]; Dr. Hipolito Bayley   Past Surgical History  Procedure Laterality Date  . Cholecystectomy  2015  . Melenoma removal  1979   Family History  Problem Relation Age of Onset  . Arthritis Mother   . Ovarian cancer  Mother   . Breast cancer Mother    History  Sexual Activity  . Sexual Activity: Not on file    Outpatient Encounter Prescriptions as of 11/14/2014  Medication Sig  . ALPRAZolam (XANAX) 0.25 MG tablet Take 1-2 tablets (0.25-0.5 mg total) by mouth daily as needed for anxiety.  . citalopram (CELEXA) 10 MG tablet TAKE 1 TABLET BY MOUTH EVERY DAY  . Fluticasone-Salmeterol (ADVAIR) 100-50 MCG/DOSE AEPB Inhale 1 puff into the lungs 2 (two) times daily.  Marland Kitchen lisinopril (PRINIVIL,ZESTRIL) 20 MG tablet  Take 1 tablet (20 mg total) by mouth daily.  . montelukast (SINGULAIR) 10 MG tablet TAKE 1 TABLET BY MOUTH AT BEDTIME  . triamcinolone (NASACORT ALLERGY 24HR) 55 MCG/ACT AERO nasal inhaler Place 2 sprays into the nose daily.  Marland Kitchen albuterol (PROVENTIL HFA;VENTOLIN HFA) 108 (90 BASE) MCG/ACT inhaler Inhale 1-2 puffs into the lungs every 6 (six) hours as needed for wheezing or shortness of breath. (Patient not taking: Reported on 11/14/2014)  . albuterol (PROVENTIL) (5 MG/ML) 0.5% nebulizer solution Take 0.5 mLs (2.5 mg total) by nebulization every 6 (six) hours as needed for wheezing or shortness of breath.  . salmeterol (SEREVENT) 50 MCG/DOSE diskus inhaler Inhale 1 puff into the lungs 2 (two) times daily. (Patient not taking: Reported on 11/14/2014)  . [DISCONTINUED] cefUROXime (CEFTIN) 250 MG tablet Take 1 tablet (250 mg total) by mouth 2 (two) times daily with a meal. (Patient not taking: Reported on 11/14/2014)  . [DISCONTINUED] predniSONE (DELTASONE) 20 MG tablet Take 1 tablet (20 mg total) by mouth daily with breakfast. (Patient not taking: Reported on 11/14/2014)   No facility-administered encounter medications on file as of 11/14/2014.    Activities of Daily Living In your present state of health, do you have any difficulty performing the following activities: 11/14/2014 08/06/2014  Hearing? N N  Vision? N N  Difficulty concentrating or making decisions? N N  Walking or climbing stairs? N N  Dressing or bathing? N N  Doing errands, shopping? N N  Preparing Food and eating ? N -  Using the Toilet? N -  In the past six months, have you accidently leaked urine? N -  Do you have problems with loss of bowel control? N -  Managing your Medications? N -  Managing your Finances? N -  Housekeeping or managing your Housekeeping? N -    Patient Care Team: Golden Circle, FNP as PCP - General (Family Medicine)    Assessment:    Objective:   Personalized Education given regarding:   Pt determined  a personalized goal    Assessment included: Bone density scan as appropriate / to order Calcium and Vit D as appropriate/ Osteoporosis risk reviewed Taking meds without issues; no barriers identified Stress: Recommendations for managing stress if assessed as a factor;  No Risk for hepatitis or high risk social behavior identified via hepatitis screen Educated on shingles and follow up with insurance company for co-pays or charges applied to Part D benefit. Safety issues reviewed Cognition assessed by AD8; Score 0 MMSE  No issues at present assessed today;     Need for Immunizations or other screenings identified;  (CDC recommmend Prevnar at 65 followed by pnuemovax 23 in one year or 5 years after the last dose.   Other screenings reviewed; Vaccine status/ has had pneumonia last week at [facility] and will call with which one Tetanus in 2011 Bone density to order Colonoscopy; 64 yo ; 2011 Mammogram/PAP; July 2016;  Eye exam; will  check with insurance company regarding coverage and doctor Hearing Dental; Can go to [employer] for cleaning        Exercise Activities and Dietary recommendations Current Exercise Habits:: Home exercise routine, Type of exercise: walking, Time (Minutes): 40, Frequency (Times/Week): 3 (will expand up to 5 days a week x 1 hour), Weekly Exercise (Minutes/Week): 120, Intensity: Moderate  Goals    . Weight < 160 lb (72.576 kg)     Goal is to start walking again up to 5 miles;       Fall Risk Fall Risk  11/14/2014 08/06/2014  Falls in the past year? No No   Depression Screen PHQ 2/9 Scores 11/14/2014 08/06/2014  PHQ - 2 Score 0 2  PHQ- 9 Score - 3     Cognitive Testing MMSE - Mini Mental State Exam 11/14/2014  Not completed: Unable to complete    Immunization History  Administered Date(s) Administered  . Influenza,inj,Quad PF,36+ Mos 05/21/2014   Screening Tests Health Maintenance  Topic Date Due  . HIV Screening  01/20/1964  .  TETANUS/TDAP  01/20/1968  . MAMMOGRAM  01/20/1999  . COLONOSCOPY  01/20/1999  . ZOSTAVAX  01/19/2009  . DEXA SCAN  01/19/2014  . PNA vac Low Risk Adult (1 of 2 - PCV13) 01/19/2014  . INFLUENZA VACCINE  01/06/2015      Plan:     1. fup with Terri Piedra regarding Cutaneous t cell lymphoma and medications review  Needs refill for med/ alprazolam .25mg  prn tramcinolone cream  2. To have lab work drawn today 3. To have dexa and mammogram over the next month or when she see [facility] for fup  During the course of the visit the patient was educated and counseled about the following appropriate screening and preventive services:   Vaccines to include Pneumoccal, Influenza, Hepatitis B, Td, Zostavax, HCV  Electrocardiogram  Cardiovascular Disease  Colorectal cancer screening  Bone density screening  Diabetes screening  Glaucoma screening  Mammography/PAP  Nutrition counseling   Patient Instructions (the written plan) was given to the patient.   VCBSW,HQPRF, RN  11/14/2014     Medical screening examination/treatment/procedure(s) were performed by non-physician practitioner and as supervising provider I was immediately available for consultation/collaboration. I agree with above. Mauricio Po, FNP

## 2014-11-21 ENCOUNTER — Encounter: Payer: Self-pay | Admitting: Family

## 2014-11-21 ENCOUNTER — Ambulatory Visit (INDEPENDENT_AMBULATORY_CARE_PROVIDER_SITE_OTHER): Payer: Medicare Other | Admitting: Family

## 2014-11-21 VITALS — BP 148/98 | HR 69 | Temp 97.8°F | Resp 18 | Ht 67.0 in | Wt 182.0 lb

## 2014-11-21 DIAGNOSIS — J4520 Mild intermittent asthma, uncomplicated: Secondary | ICD-10-CM

## 2014-11-21 DIAGNOSIS — C84A0 Cutaneous T-cell lymphoma, unspecified, unspecified site: Secondary | ICD-10-CM | POA: Insufficient documentation

## 2014-11-21 MED ORDER — TRIAMCINOLONE ACETONIDE 0.1 % EX CREA: 1.0000 "application " | TOPICAL_CREAM | Freq: Two times a day (BID) | CUTANEOUS | Status: DC

## 2014-11-21 NOTE — Assessment & Plan Note (Signed)
Remains stable with current triamcinalone 0.1%. Continue current dosage of triamcinalone 0.1% and follow up with dermatology as needed.

## 2014-11-21 NOTE — Progress Notes (Signed)
Pre visit review using our clinic review tool, if applicable. No additional management support is needed unless otherwise documented below in the visit note. 

## 2014-11-21 NOTE — Patient Instructions (Addendum)
Thank you for choosing [facility].  Summary/Instructions: Please take your medications as prescribed.   Your prescription(s) have been submitted to your pharmacy or been printed and provided for you. Please take as directed and contact our office if you believe you are having problem(s) with the medication(s) or have any questions.   If your symptoms worsen or fail to improve, please contact our office for further instruction, or in case of emergency go directly to the emergency room at the closest medical facility.

## 2014-11-21 NOTE — Progress Notes (Signed)
Subjective:    Patient ID: Glenda Bauer, female    DOB: 08-Apr-1951, 64 y.o.   MRN: [id number]  Chief Complaint  Patient presents with  . Follow-up    review wellness visit    HPI:  Glenda Bauer is a 64 y.o. female with a PMH of hypertension, asthma, anxiety and depression who presents today for an office follow up.    1.)  T Cell lymphoma of skin - Currently maintained on triamcinalone 0.1% cream that was prescribed by dermatology. Also treated with light therapy and remains well controlled at this time. Previous dermatologist follow up as needed with dermatology. Requesting refill of triamcinalone cream.   2.) Asthma - Currently using albuterol rarely, Singulair and Advair. Indicates that the medications are controlling her symptoms well. Does note occasional shortness breath without chest tightness with physical activity.   No Known Allergies   Current Outpatient Prescriptions on File Prior to Visit  Medication Sig Dispense Refill  . albuterol (PROVENTIL) (5 MG/ML) 0.5% nebulizer solution Take 0.5 mLs (2.5 mg total) by nebulization every 6 (six) hours as needed for wheezing or shortness of breath. 20 mL 0  . albuterol (VENTOLIN HFA) 108 (90 BASE) MCG/ACT inhaler INHALE 1-2 PUFFS INTO THE LUNGS EVERY 6 HOURS AS NEEDED FOR WHEEZING OR SHORTNESS OF BREATH 18 g 1  . ALPRAZolam (XANAX) 0.25 MG tablet Take 1-2 tablets (0.25-0.5 mg total) by mouth daily as needed for anxiety. 20 tablet 0  . citalopram (CELEXA) 10 MG tablet TAKE 1 TABLET BY MOUTH EVERY DAY 30 tablet 11  . Fluticasone-Salmeterol (ADVAIR) 100-50 MCG/DOSE AEPB Inhale 1 puff into the lungs 2 (two) times daily. 1 each 3  . lisinopril (PRINIVIL,ZESTRIL) 20 MG tablet Take 1 tablet (20 mg total) by mouth daily. 30 tablet 0  . montelukast (SINGULAIR) 10 MG tablet TAKE 1 TABLET BY MOUTH AT BEDTIME 30 tablet 5  . triamcinolone (NASACORT ALLERGY 24HR) 55 MCG/ACT AERO nasal inhaler Place 2 sprays into the nose daily.       No current facility-administered medications on file prior to visit.    Review of Systems  Constitutional: Negative for fever and chills.  Respiratory: Negative for cough, chest tightness and shortness of breath.   Skin: Negative for rash.      Objective:    BP 148/98 mmHg  Pulse 69  Temp(Src) 97.8 F (36.6 C) (Oral)  Resp 18  Ht 5\' 7"  (1.702 m)  Wt 182 lb (82.555 kg)  BMI 28.50 kg/m2  SpO2 96% Nursing note and vital signs reviewed.  Physical Exam  Constitutional: She is oriented to person, place, and time. She appears well-developed and well-nourished. No distress.  Cardiovascular: Normal rate, regular rhythm, normal heart sounds and intact distal pulses.   Pulmonary/Chest: Effort normal and breath sounds normal.  Neurological: She is alert and oriented to person, place, and time.  Skin: Skin is warm and dry.  Psychiatric: She has a normal mood and affect. Her behavior is normal. Judgment and thought content normal.       Assessment & Plan:   Problem List Items Addressed This Visit      Respiratory   Asthma, chronic - Primary    Asthma remains controlled with current regimen of singulair, albuterol, and Advair. Denies frequent use of albuterol. Continue current dosages of albuterol, advair and Singulair. Follow up as needed.         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 Continue current dosage of triamcinalone 0.1% and follow up with dermatology as needed.       Relevant Medications   fexofenadine (ALLEGRA) 30 MG tablet

## 2014-11-21 NOTE — Assessment & Plan Note (Signed)
Asthma remains controlled with current regimen of singulair, albuterol, and Advair. Denies frequent use of albuterol. Continue current dosages of albuterol, advair and Singulair. Follow up as needed.

## 2014-11-26 ENCOUNTER — Ambulatory Visit (INDEPENDENT_AMBULATORY_CARE_PROVIDER_SITE_OTHER)
Admission: RE | Admit: 2014-11-26 | Discharge: 2014-11-26 | Disposition: A | Discharge status: Home / Self Care | Payer: Medicare Other | Source: Ambulatory Visit | Attending: Family | Admitting: Family

## 2014-11-26 DIAGNOSIS — Z780 Asymptomatic menopausal state: Secondary | ICD-10-CM | POA: Diagnosis not present

## 2014-12-10 NOTE — Telephone Encounter (Signed)
Please inform the patient that her bone mineral density scan was normal with minimal risk of osteoporosis. Therefore please continue perform weight bearing exercise and consuming calcium and vitamin D to help continue her current status.

## 2014-12-11 ENCOUNTER — Telehealth: Payer: Self-pay | Admitting: Family

## 2014-12-11 NOTE — Telephone Encounter (Signed)
LVM for pt to call back.

## 2014-12-11 NOTE — Telephone Encounter (Signed)
Pt aware of results 

## 2014-12-11 NOTE — Telephone Encounter (Signed)
  Patient has returned your call 

## 2014-12-24 ENCOUNTER — Telehealth: Payer: Self-pay | Admitting: Geriatric Medicine

## 2014-12-24 NOTE — Telephone Encounter (Signed)
Patient has not had a mammogram. She said its due. She is going to call and schedule it.

## 2015-01-01 ENCOUNTER — Other Ambulatory Visit: Payer: Self-pay | Admitting: Family

## 2015-02-02 ENCOUNTER — Other Ambulatory Visit: Payer: Self-pay | Admitting: Family

## 2015-02-12 ENCOUNTER — Encounter: Payer: Self-pay | Admitting: Family

## 2015-02-12 ENCOUNTER — Other Ambulatory Visit (INDEPENDENT_AMBULATORY_CARE_PROVIDER_SITE_OTHER): Payer: Medicare Other

## 2015-02-12 ENCOUNTER — Ambulatory Visit (INDEPENDENT_AMBULATORY_CARE_PROVIDER_SITE_OTHER): Payer: Medicare Other | Admitting: Family

## 2015-02-12 VITALS — BP 160/88 | HR 69 | Temp 97.8°F | Resp 18 | Ht 67.0 in | Wt 184.0 lb

## 2015-02-12 DIAGNOSIS — R1011 Right upper quadrant pain: Secondary | ICD-10-CM

## 2015-02-12 DIAGNOSIS — I10 Essential (primary) hypertension: Secondary | ICD-10-CM | POA: Diagnosis not present

## 2015-02-12 DIAGNOSIS — F419 Anxiety disorder, unspecified: Secondary | ICD-10-CM

## 2015-02-12 DIAGNOSIS — F418 Other specified anxiety disorders: Secondary | ICD-10-CM | POA: Diagnosis not present

## 2015-02-12 DIAGNOSIS — F329 Major depressive disorder, single episode, unspecified: Secondary | ICD-10-CM

## 2015-02-12 LAB — CBC WITH DIFFERENTIAL/PLATELET
BASOS PCT: 1.8 % (ref 0.0–3.0)
Basophils Absolute: 0.1 10*3/uL (ref 0.0–0.1)
EOS PCT: 9.3 % — AB (ref 0.0–5.0)
Eosinophils Absolute: 0.5 10*3/uL (ref 0.0–0.7)
HEMATOCRIT: 44.2 % (ref 36.0–46.0)
HEMOGLOBIN: 15 g/dL (ref 12.0–15.0)
LYMPHS PCT: 33.5 % (ref 12.0–46.0)
Lymphs Abs: 1.7 10*3/uL (ref 0.7–4.0)
MCHC: 34 g/dL (ref 30.0–36.0)
MCV: 87.3 fl (ref 78.0–100.0)
MONO ABS: 0.5 10*3/uL (ref 0.1–1.0)
Monocytes Relative: 9.9 % (ref 3.0–12.0)
NEUTROS ABS: 2.3 10*3/uL (ref 1.4–7.7)
Neutrophils Relative %: 45.5 % (ref 43.0–77.0)
Platelets: 256 10*3/uL (ref 150.0–400.0)
RBC: 5.07 Mil/uL (ref 3.87–5.11)
RDW: 12.8 % (ref 11.5–15.5)
WBC: 5.1 10*3/uL (ref 4.0–10.5)

## 2015-02-12 LAB — AMYLASE: Amylase: 23 U/L — ABNORMAL LOW (ref 27–131)

## 2015-02-12 LAB — HEPATIC FUNCTION PANEL
ALBUMIN: 4.1 g/dL (ref 3.5–5.2)
ALK PHOS: 58 U/L (ref 39–117)
ALT: 31 U/L (ref 0–35)
AST: 20 U/L (ref 0–37)
BILIRUBIN TOTAL: 0.5 mg/dL (ref 0.2–1.2)
Bilirubin, Direct: 0.1 mg/dL (ref 0.0–0.3)
Total Protein: 6.8 g/dL (ref 6.0–8.3)

## 2015-02-12 LAB — LIPASE: Lipase: 10 U/L — ABNORMAL LOW (ref 11.0–59.0)

## 2015-02-12 MED ORDER — HYDROCHLOROTHIAZIDE 12.5 MG PO CAPS
12.5000 mg | ORAL_CAPSULE | Freq: Every day | ORAL | Status: DC
Start: 2015-02-12 — End: 2015-04-09

## 2015-02-12 MED ORDER — ALPRAZOLAM 0.25 MG PO TABS: 0.2500 mg | ORAL_TABLET | Freq: Every day | ORAL | Status: DC | PRN

## 2015-02-12 MED ORDER — ACETAMINOPHEN-CODEINE #3 300-30 MG PO TABS: 1.0000 | ORAL_TABLET | ORAL | Status: DC | PRN

## 2015-02-12 NOTE — Assessment & Plan Note (Signed)
Refill Xanax

## 2015-02-12 NOTE — Patient Instructions (Addendum)
Thank you for choosing [facility].  Summary/Instructions:  Please follow up for a nurse visit in 2 weeks.   Your prescription(s) have been submitted to your pharmacy or been printed and provided for you. Please take as directed and contact our office if you believe you are having problem(s) with the medication(s) or have any questions.  Please stop by the lab on the basement level of the building for your blood work. Your results will be released to [facility] (or called to you) after review, usually within 72 hours after test completion. If any changes need to be made, you will be notified at that same time.  Referrals have been made during this visit. You should expect to hear back from our schedulers in about 7-10 days in regards to establishing an appointment with the specialists we discussed.   If your symptoms worsen or fail to improve, please contact our office for further instruction, or in case of emergency go directly to the emergency room at the closest medical facility.

## 2015-02-12 NOTE — Assessment & Plan Note (Signed)
Upper right quadrant abdominal pain s/p cholecystectomy. Obtain lipase, amylase, hepatic function panel, CBC and abdominal ultrasound. Question possible scar tissue/adhesion versus other underlying pathology. Start Tylenol #3 as needed for pain. Follow up pending lab work and ultrasound or if symptoms worsen prior to then.

## 2015-02-12 NOTE — Progress Notes (Signed)
Subjective:    Patient ID: Glenda Bauer, female    DOB: 04-Jun-1951, 64 y.o.   MRN: [id number]  Chief Complaint  Patient presents with  . Side pain    had her gallbladder removed a year and a half ago, has a non stop pain in her right side, has sharp shooting "knife like" pain, x1 month    HPI:  Glenda Bauer is a 64 y.o. female with a PMH of hypertension, asthma, anxiety, depression and cholecysectomy who presents today for an acute office visit.   This is a new problem. Associated symptom of pain located in her abdomen that is described as sharp/shooting and "knife like" has been going on for about 1 month. Notes that it feels the same as when her gall bladder was inflammed. The hurting/aching is always there but the sharp pain started when she got out of bed and moved in a different direction. Denies any sounds or sensations heard or felt. Modifying factors include omeprazole and ibuprofen which did not help with pain. Severity of the pain is about 7/10. Timing of the symptoms is constant throughout the day and not movement dependent.   BP Readings from Last 3 Encounters:  02/12/15 160/88  11/21/14 148/98  11/14/14 134/70    No Known Allergies   Current Outpatient Prescriptions on File Prior to Visit  Medication Sig Dispense Refill  . ADVAIR DISKUS 100-50 MCG/DOSE AEPB INHALE 1 PUFF INTO THE LUNGS TWICE DAILY 14 each 0  . albuterol (PROVENTIL) (5 MG/ML) 0.5% nebulizer solution Take 0.5 mLs (2.5 mg total) by nebulization every 6 (six) hours as needed for wheezing or shortness of breath. 20 mL 0  . albuterol (VENTOLIN HFA) 108 (90 BASE) MCG/ACT inhaler INHALE 1-2 PUFFS INTO THE LUNGS EVERY 6 HOURS AS NEEDED FOR WHEEZING OR SHORTNESS OF BREATH 18 g 1  . citalopram (CELEXA) 10 MG tablet TAKE 1 TABLET BY MOUTH EVERY DAY 30 tablet 11  . fexofenadine (ALLEGRA) 30 MG tablet Take 30 mg by mouth 2 (two) times daily.    Marland Kitchen lisinopril (PRINIVIL,ZESTRIL) 20 MG tablet Take 1 tablet  (20 mg total) by mouth daily. 30 tablet 0  . montelukast (SINGULAIR) 10 MG tablet TAKE 1 TABLET BY MOUTH AT BEDTIME 30 tablet 5  . triamcinolone (NASACORT ALLERGY 24HR) 55 MCG/ACT AERO nasal inhaler Place 2 sprays into the nose daily.    Marland Kitchen triamcinolone cream (KENALOG) 0.1 % Apply 1 application topically 2 (two) times daily. 453.6 g 1   No current facility-administered medications on file prior to visit.    Review of Systems  Constitutional: Negative for fever and chills.  Gastrointestinal: Positive for abdominal pain. Negative for nausea, vomiting, diarrhea, constipation, blood in stool and abdominal distention.      Objective:    BP 160/88 mmHg  Pulse 69  Temp(Src) 97.8 F (36.6 C) (Oral)  Resp 18  Ht 5\' 7"  (1.702 m)  Wt 184 lb (83.462 kg)  BMI 28.81 kg/m2  SpO2 97% Nursing note and vital signs reviewed.  Physical Exam  Constitutional: She is oriented to person, place, and time. She appears well-developed and well-nourished. No distress.  Cardiovascular: Normal rate, regular rhythm, normal heart sounds and intact distal pulses.   Pulmonary/Chest: Effort normal and breath sounds normal.  Abdominal: There is no hepatomegaly. There is tenderness in the right upper quadrant and epigastric area. There is no rigidity, no rebound, no guarding, no tenderness at McBurney's point and negative Murphy's sign.  Neurological: She is alert and  oriented to person, place, and time.  Skin: Skin is warm and dry.  Psychiatric: She has a normal mood and affect. Her behavior is normal. Judgment and thought content normal.       Assessment & Plan:   Problem List Items Addressed This Visit      Cardiovascular and Mediastinum   Essential hypertension    Hypertension remains uncontrolled and above goal of 140/90 with current regimen. Start hydrochlorothiazide. Continue current dosage of lisinopril. Continue to monitor blood pressure at home. Follow up in 2 weeks for a nurse visit to check blood  pressure. At next refill will start Zestoretic.       Relevant Medications   hydrochlorothiazide (MICROZIDE) 12.5 MG capsule     Other   Anxiety and depression    Refill Xanax.       Relevant Medications   ALPRAZolam (XANAX) 0.25 MG tablet   Abdominal pain, right upper quadrant - Primary    Upper right quadrant abdominal pain s/p cholecystectomy. Obtain lipase, amylase, hepatic function panel, CBC and abdominal ultrasound. Question possible scar tissue/adhesion versus other underlying pathology. Start Tylenol #3 as needed for pain. Follow up pending lab work and ultrasound or if symptoms worsen prior to then.       Relevant Medications   acetaminophen-codeine (TYLENOL #3) 300-30 MG per tablet   Other Relevant Orders   Hepatic function panel (Completed)   Lipase (Completed)   Amylase (Completed)   CBC w/Diff (Completed)   US Abdomen Complete

## 2015-02-12 NOTE — Assessment & Plan Note (Signed)
Hypertension remains uncontrolled and above goal of 140/90 with current regimen. Start hydrochlorothiazide. Continue current dosage of lisinopril. Continue to monitor blood pressure at home. Follow up in 2 weeks for a nurse visit to check blood pressure. At next refill will start Zestoretic.

## 2015-02-12 NOTE — Progress Notes (Signed)
Pre visit review using our clinic review tool, if applicable. No additional management support is needed unless otherwise documented below in the visit note. 

## 2015-02-13 ENCOUNTER — Telehealth: Payer: Self-pay | Admitting: Family

## 2015-02-13 NOTE — Telephone Encounter (Signed)
Please inform patient that her blood work is consistent with gall bladder removal and no evidence of infection. Her liver function panel is also normal. Therefore we will wait until the ultrasound is completed. Pending results it may require a follow up with gastroenterology.

## 2015-02-14 NOTE — Telephone Encounter (Signed)
Pt aware of results 

## 2015-02-28 ENCOUNTER — Ambulatory Visit
Admission: RE | Admit: 2015-02-28 | Discharge: 2015-02-28 | Disposition: A | Discharge status: Home / Self Care | Payer: Medicare Other | Source: Ambulatory Visit | Attending: Family | Admitting: Family

## 2015-02-28 DIAGNOSIS — R1031 Right lower quadrant pain: Secondary | ICD-10-CM | POA: Diagnosis not present

## 2015-02-28 DIAGNOSIS — R1011 Right upper quadrant pain: Secondary | ICD-10-CM

## 2015-02-28 IMAGING — US US ABDOMEN COMPLETE
1 series · 14 of 25 positions shown · non-contrast
Comparison: None.

CLINICAL DATA: Right upper quadrant pain for 2 months

EXAM:
ULTRASOUND ABDOMEN COMPLETE

[Series 1: us abdomen complete · 0.22mm/px · 14 of 72 slices shown]
[im 1/72]
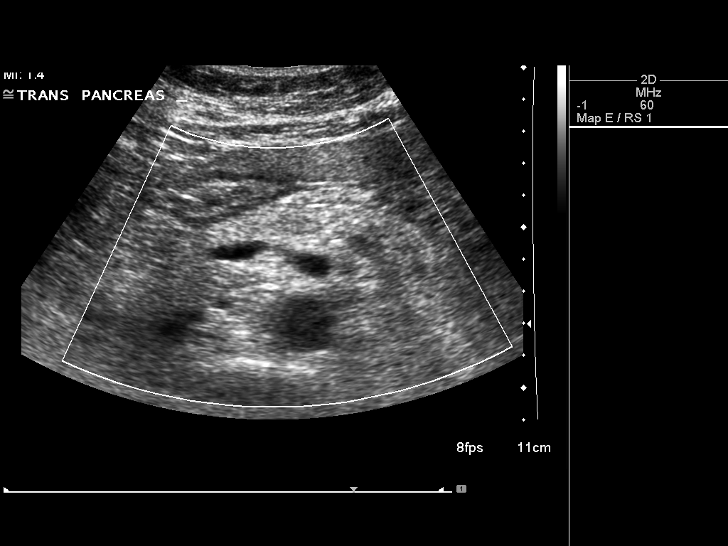
[im 6/72]
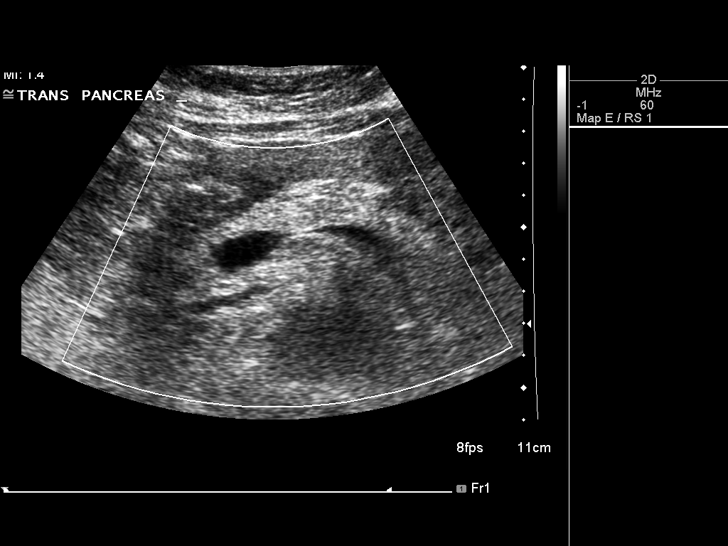
[im 12/72]
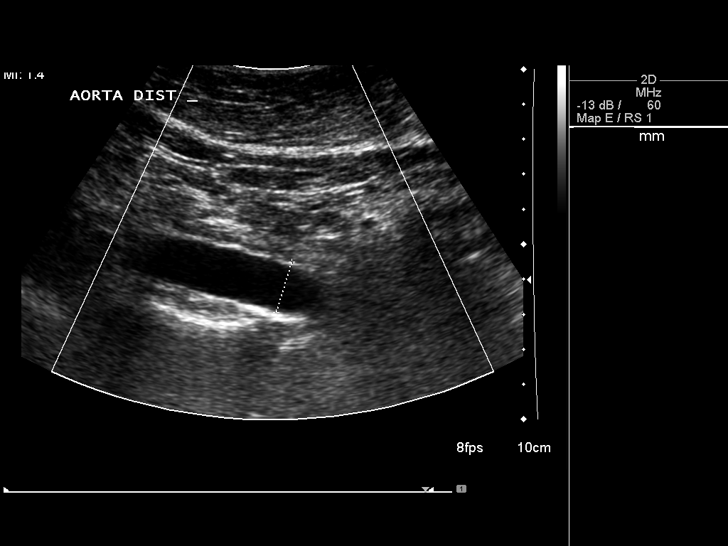
[im 18/72]
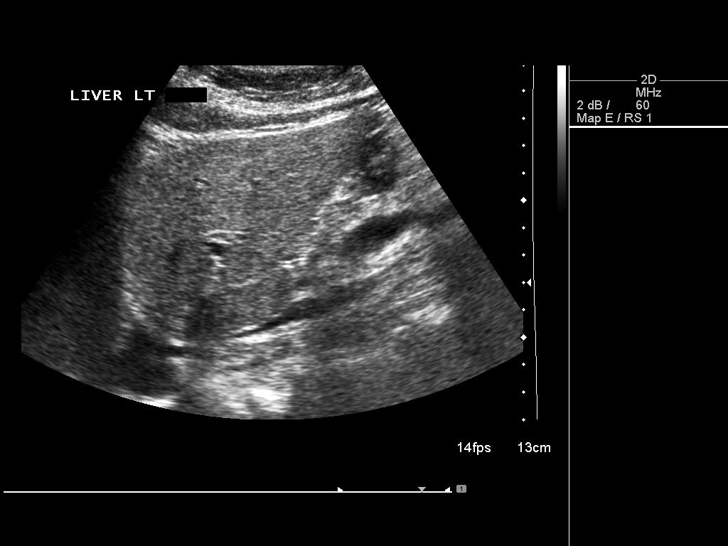
[im 24/72]
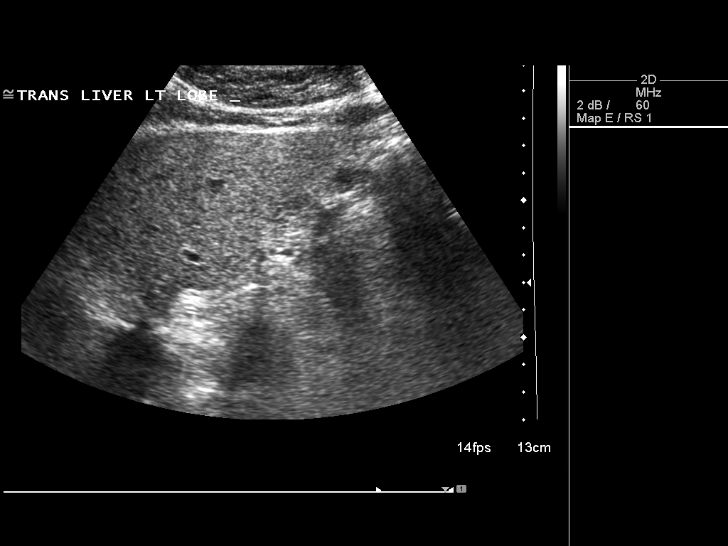
[im 27/72]
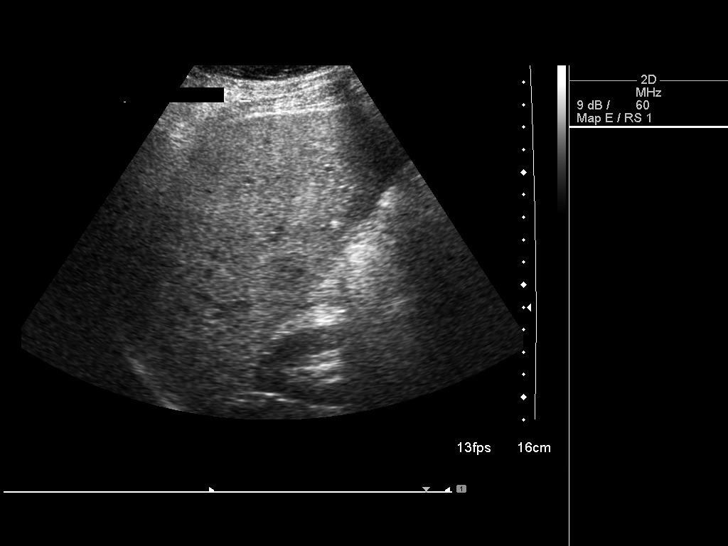
[im 33/72]
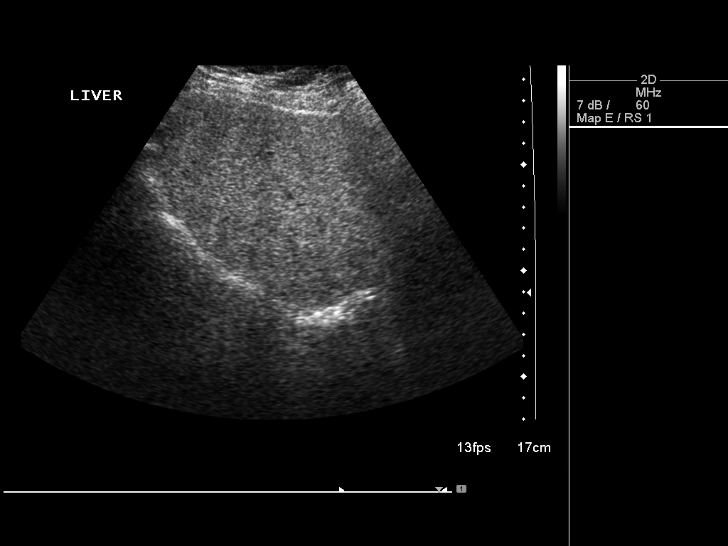
[im 39/72]
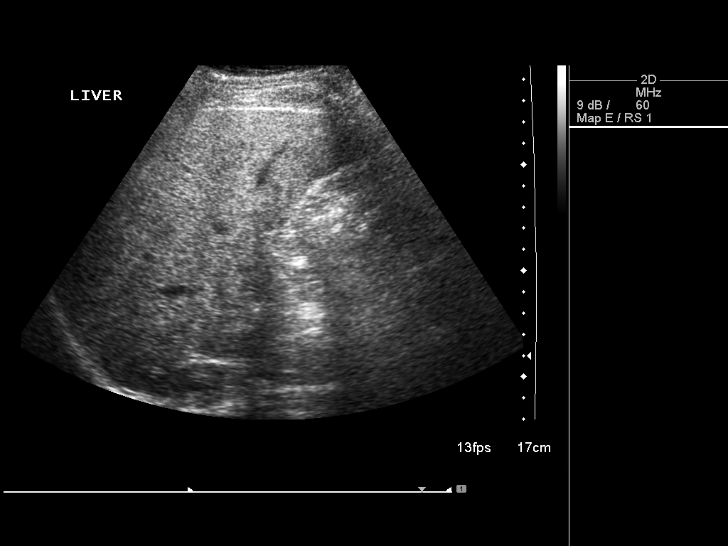
[im 45/72]
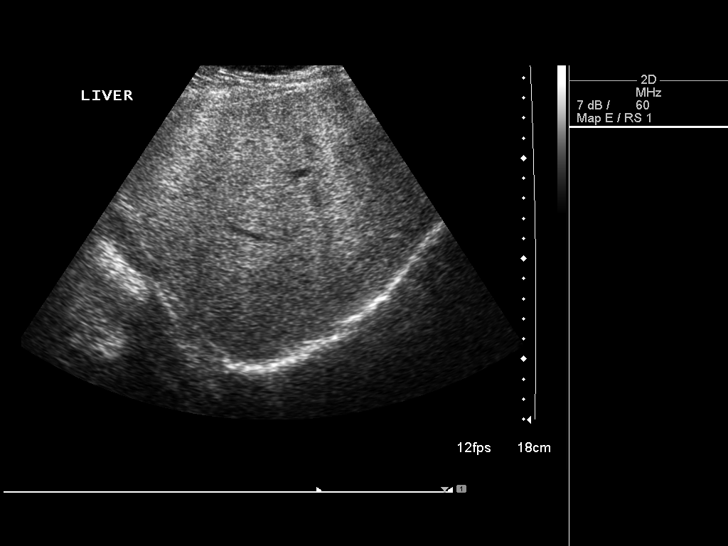
[im 48/72]
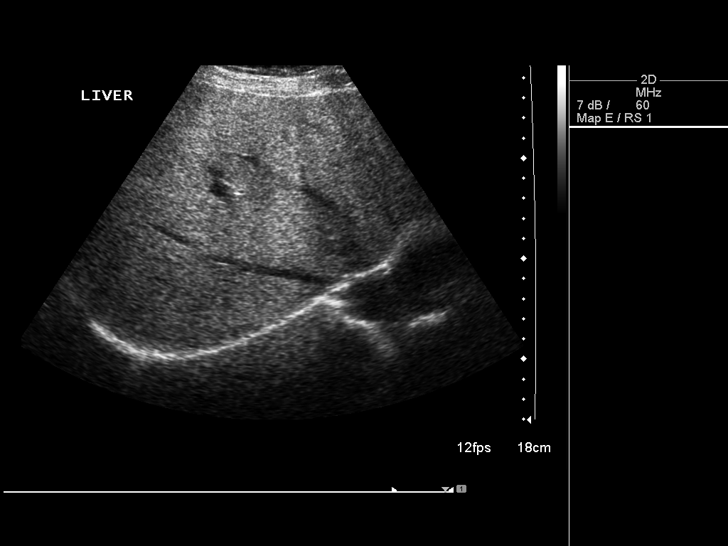
[im 54/72]
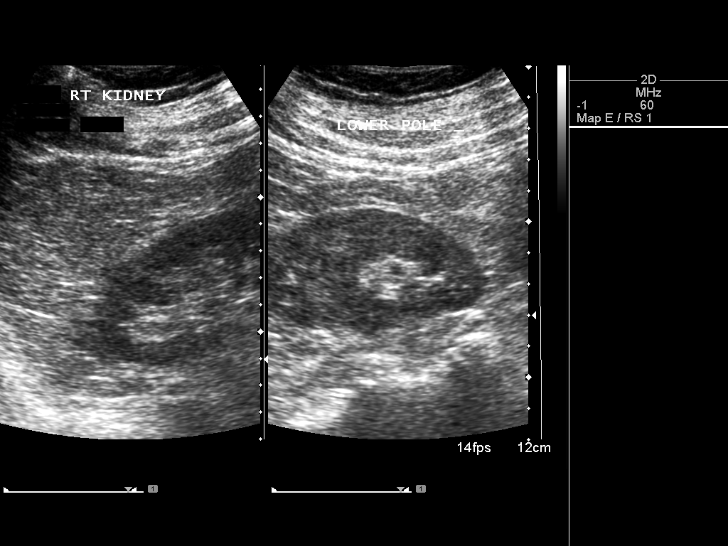
[im 60/72]
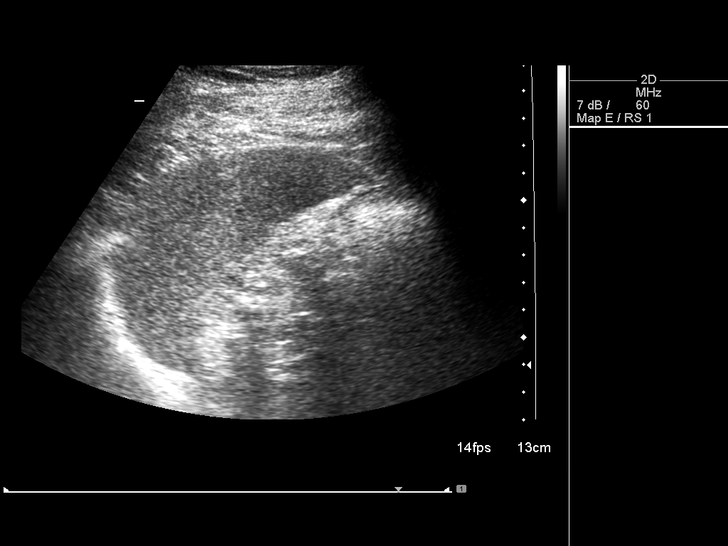
[im 66/72]
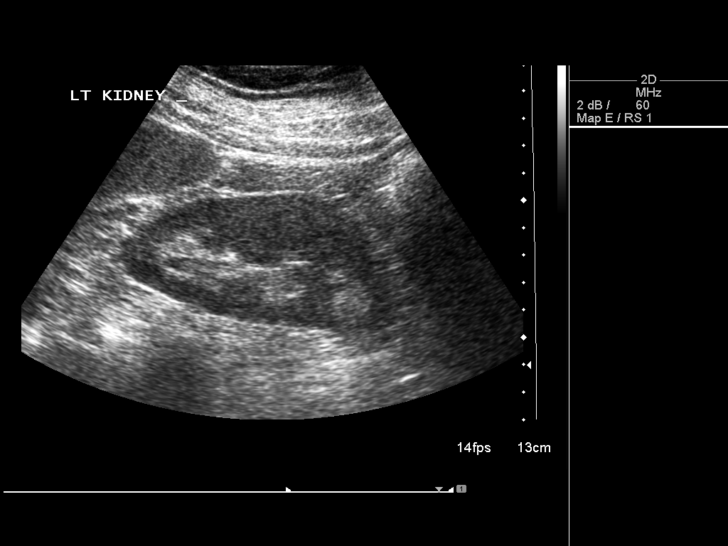
[im 72/72]
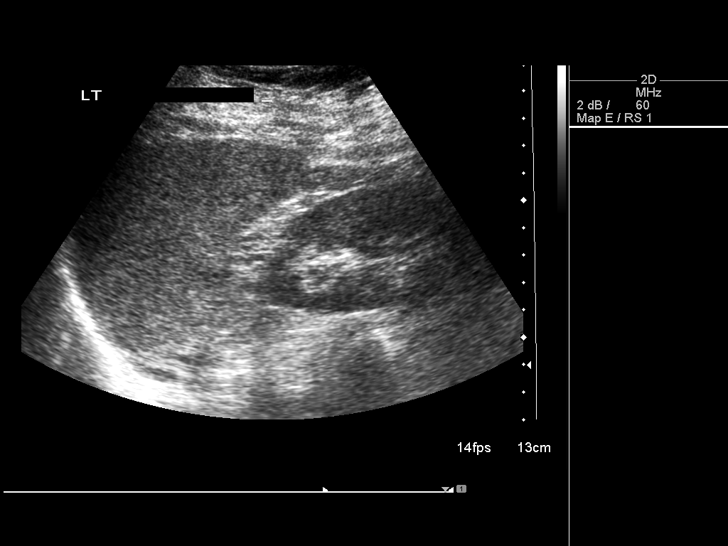

[14 of 25 positions shown; findings below may reference images not displayed]

FINDINGS: Gallbladder: Surgically absent

Common bile duct: Diameter: 2.5 mm in diameter within normal limits

Liver: No focal hepatic mass. There is diffuse increased
echogenicity of the liver suspicious for fatty infiltration.

IVC: No abnormality visualized.

Pancreas: Visualized portion unremarkable.

Spleen: Size and appearance within normal limits. Measures 9 cm in
length

Right Kidney: Length: 10.3 cm. Echogenicity within normal limits. No
mass or hydronephrosis visualized.

Left Kidney: Length: 10.5 cm. Echogenicity within normal limits. No
mass or hydronephrosis visualized.

Abdominal aorta: No aneurysm visualized. Measures up to 2.9 cm in
diameter.

Other findings: None.
IMPRESSION: Surgically absent gallbladder. Diffuse increased echogenicity of the
liver suspicious for fatty infiltration. No hydronephrosis. No
aortic aneurysm.

## 2015-03-02 ENCOUNTER — Telehealth: Payer: Self-pay | Admitting: Family

## 2015-03-02 NOTE — Telephone Encounter (Signed)
Please inform patient that her ultrasound shows no clear reason for her continued abdominal pain although there may be mild fatty liver development. Her liver panel when we checked it was normal. So the only treatment is to watch her dietary intake of fat and refined carbohydrates. If she continues to experience the same pain, we can send her to gastroenterology for further evaluation.

## 2015-03-04 NOTE — Telephone Encounter (Signed)
Pt aware of results 

## 2015-03-06 ENCOUNTER — Other Ambulatory Visit: Payer: Self-pay | Admitting: Family

## 2015-03-06 ENCOUNTER — Encounter: Payer: Self-pay | Admitting: Family

## 2015-03-06 ENCOUNTER — Ambulatory Visit (INDEPENDENT_AMBULATORY_CARE_PROVIDER_SITE_OTHER): Payer: Medicare Other | Admitting: Family

## 2015-03-06 VITALS — BP 134/94 | HR 75 | Temp 98.5°F | Resp 18 | Ht 67.0 in | Wt 181.0 lb

## 2015-03-06 DIAGNOSIS — M5430 Sciatica, unspecified side: Secondary | ICD-10-CM | POA: Insufficient documentation

## 2015-03-06 DIAGNOSIS — M5432 Sciatica, left side: Secondary | ICD-10-CM | POA: Diagnosis not present

## 2015-03-06 MED ORDER — PREDNISONE 10 MG PO TABS: ORAL_TABLET | ORAL | Status: DC

## 2015-03-06 NOTE — Progress Notes (Signed)
Subjective:    Patient ID: Glenda Bauer, female    DOB: 07/28/1950, 64 y.o.   MRN: [id number]  Chief Complaint  Patient presents with  . Hip Pain    Left hip pain that has been bothering for 2 weeks, states that she does not know what couldve caused the pain, constant pain     HPI:  Glenda Bauer is a 64 y.o. female who  has a past medical history of Asthma; Cancer; Depression; Heart murmur; Anxiety; Allergy; Hypertension; Urinary tract infection; and Cutaneous T-cell lymphoma (2011). and presents today for a follow up office visit.   Associated symptoms of pain located in her left hp has been going on for about 2 weeks. Modifying factors include ibuprofen which did help with the pain, however experienced worsening that she took a former vidodin prescription that did help somewhat. Pain is described as constant and sharp with some radiation. Severity of the pain is about 6/10. Since Friday the pain has decreased and and appears to be improving slightly. Denies trauma, or sounds/sensations heard or felt.   No Known Allergies   Current Outpatient Prescriptions on File Prior to Visit  Medication Sig Dispense Refill  . acetaminophen-codeine (TYLENOL #3) 300-30 MG per tablet Take 1 tablet by mouth every 4 (four) hours as needed for moderate pain. 40 tablet 0  . ADVAIR DISKUS 100-50 MCG/DOSE AEPB INHALE 1 PUFF INTO THE LUNGS TWICE DAILY 14 each 0  . albuterol (PROVENTIL) (5 MG/ML) 0.5% nebulizer solution Take 0.5 mLs (2.5 mg total) by nebulization every 6 (six) hours as needed for wheezing or shortness of breath. 20 mL 0  . albuterol (VENTOLIN HFA) 108 (90 BASE) MCG/ACT inhaler INHALE 1-2 PUFFS INTO THE LUNGS EVERY 6 HOURS AS NEEDED FOR WHEEZING OR SHORTNESS OF BREATH 18 g 1  . ALPRAZolam (XANAX) 0.25 MG tablet Take 1-2 tablets (0.25-0.5 mg total) by mouth daily as needed for anxiety. 20 tablet 0  . citalopram (CELEXA) 10 MG tablet TAKE 1 TABLET BY MOUTH EVERY DAY 30 tablet 11  .  fexofenadine (ALLEGRA) 30 MG tablet Take 30 mg by mouth 2 (two) times daily.    . hydrochlorothiazide (MICROZIDE) 12.5 MG capsule Take 1 capsule (12.5 mg total) by mouth daily. 30 capsule 0  . lisinopril (PRINIVIL,ZESTRIL) 20 MG tablet Take 1 tablet (20 mg total) by mouth daily. 30 tablet 0  . montelukast (SINGULAIR) 10 MG tablet TAKE 1 TABLET BY MOUTH AT BEDTIME 30 tablet 5  . triamcinolone (NASACORT ALLERGY 24HR) 55 MCG/ACT AERO nasal inhaler Place 2 sprays into the nose daily.    Marland Kitchen triamcinolone cream (KENALOG) 0.1 % Apply 1 application topically 2 (two) times daily. 453.6 g 1   No current facility-administered medications on file prior to visit.     Review of Systems  Constitutional: Negative for fever and chills.  Musculoskeletal:       Positive for hip pain  Neurological: Negative for weakness and numbness.      Objective:    BP 134/94 mmHg  Pulse 75  Temp(Src) 98.5 F (36.9 C) (Rectal)  Resp 18  Ht 5\' 7"  (1.702 m)  Wt 181 lb (82.101 kg)  BMI 28.34 kg/m2  SpO2 97% Nursing note and vital signs reviewed.  Physical Exam  Constitutional: She is oriented to person, place, and time. She appears well-developed and well-nourished. No distress.  Cardiovascular: Normal rate, regular rhythm, normal heart sounds and intact distal pulses.   Pulmonary/Chest: Effort normal and breath sounds normal.  Musculoskeletal:  Left hip/back - no obvious deformity, discoloration, or edema noted. Tenderness elicited superior to the greater trochanter of the hip wrapping posteriorly. Hip range of motion is intact and appropriate. Lumbar range of motion is intact and appropriate. Distal pulses, sensation, and reflexes are intact and appropriate. Negative hip scourring and FABER.  Neurological: She is alert and oriented to person, place, and time.  Skin: Skin is warm and dry.  Psychiatric: She has a normal mood and affect. Her behavior is normal. Judgment and thought content normal.         Assessment & Plan:   Problem List Items Addressed This Visit      Nervous and Auditory   Sciatica - Primary    Symptoms and exam consistent with tenderness along sciatic nerve distribution. Treat conservatively with prednisone and over-the-counter anti-inflammatories as needed for discomfort. Home exercise therapy initiated. Ice/heat as needed. Follow-up if symptoms worsen or fail to improve with conservative therapy.      Relevant Medications   predniSONE (DELTASONE) 10 MG tablet

## 2015-03-06 NOTE — Assessment & Plan Note (Signed)
Symptoms and exam consistent with tenderness along sciatic nerve distribution. Treat conservatively with prednisone and over-the-counter anti-inflammatories as needed for discomfort. Home exercise therapy initiated. Ice/heat as needed. Follow-up if symptoms worsen or fail to improve with conservative therapy.

## 2015-03-06 NOTE — Patient Instructions (Addendum)
Thank you for choosing [facility].  Summary/Instructions:  Your prescription(s) have been submitted to your pharmacy or been printed and provided for you. Please take as directed and contact our office if you believe you are having problem(s) with the medication(s) or have any questions.  If your symptoms worsen or fail to improve, please contact our office for further instruction, or in case of emergency go directly to the emergency room at the closest medical facility.    6 Day Prednisone Taper Instructions:   Day 1: Two tablets before breakfast, one after lunch, one after dinner, and two at bedtime.  Day 2: One tablet before breakfast, one after lunch, one after dinner, and two at bedtime Day 3: One tablet before breakfast, one after lunch, one after dinner, and one at bedtime Day 4: One tablet before breakfast, one after lunch, and one at bedtime Day 5: One tablet before breakfast and one at bedtime Day 6: One tablet before breakfast  Sciatica with Rehab The sciatic nerve runs from the back down the leg and is responsible for sensation and control of the muscles in the back (posterior) side of the thigh, lower leg, and foot. Sciatica is a condition that is characterized by inflammation of this nerve.  SYMPTOMS   Signs of nerve damage, including numbness and/or weakness along the posterior side of the lower extremity.  Pain in the back of the thigh that may also travel down the leg.  Pain that worsens when sitting for long periods of time.  Occasionally, pain in the back or buttock. CAUSES  Inflammation of the sciatic nerve is the cause of sciatica. The inflammation is due to something irritating the nerve. Common sources of irritation include:  Sitting for long periods of time.  Direct trauma to the nerve.  Arthritis of the spine.  Herniated or ruptured disk.  Slipping of the vertebrae (spondylolisthesis).  Pressure from soft tissues, such as muscles or  ligament-like tissue (fascia). RISK INCREASES WITH:  Sports that place pressure or stress on the spine (football or weightlifting).  Poor strength and flexibility.  Failure to warm up properly before activity.  Family history of low back pain or disk disorders.  Previous back injury or surgery.  Poor body mechanics, especially when lifting, or poor posture. PREVENTION   Warm up and stretch properly before activity.  Maintain physical fitness:  Strength, flexibility, and endurance.  Cardiovascular fitness.  Learn and use proper technique, especially with posture and lifting. When possible, have coach correct improper technique.  Avoid activities that place stress on the spine. PROGNOSIS If treated properly, then sciatica usually resolves within 6 weeks. However, occasionally surgery is necessary.  RELATED COMPLICATIONS   Permanent nerve damage, including pain, numbness, tingle, or weakness.  Chronic back pain.  Risks of surgery: infection, bleeding, nerve damage, or damage to surrounding tissues. TREATMENT Treatment initially involves resting from any activities that aggravate your symptoms. The use of ice and medication may help reduce pain and inflammation. The use of strengthening and stretching exercises may help reduce pain with activity. These exercises may be performed at home or with referral to a therapist. A therapist may recommend further treatments, such as transcutaneous electronic nerve stimulation (TENS) or ultrasound. Your caregiver may recommend corticosteroid injections to help reduce inflammation of the sciatic nerve. If symptoms persist despite non-surgical (conservative) treatment, then surgery may be recommended. MEDICATION  If pain medication is necessary, then nonsteroidal anti-inflammatory medications, such as aspirin and ibuprofen, or other minor pain relievers, such  as acetaminophen, are often recommended.  Do not take pain medication for 7 days  before surgery.  Prescription pain relievers may be given if deemed necessary by your caregiver. Use only as directed and only as much as you need.  Ointments applied to the skin may be helpful.  Corticosteroid injections may be given by your caregiver. These injections should be reserved for the most serious cases, because they may only be given a certain number of times. HEAT AND COLD  Cold treatment (icing) relieves pain and reduces inflammation. Cold treatment should be applied for 10 to 15 minutes every 2 to 3 hours for inflammation and pain and immediately after any activity that aggravates your symptoms. Use ice packs or massage the area with a piece of ice (ice massage).  Heat treatment may be used prior to performing the stretching and strengthening activities prescribed by your caregiver, physical therapist, or athletic trainer. Use a heat pack or soak the injury in warm water. SEEK MEDICAL CARE IF:  Treatment seems to offer no benefit, or the condition worsens.  Any medications produce adverse side effects. EXERCISES  RANGE OF MOTION (ROM) AND STRETCHING EXERCISES - Sciatica Most people with sciatic will find that their symptoms worsen with either excessive bending forward (flexion) or arching at the low back (extension). The exercises which will help resolve your symptoms will focus on the opposite motion. Your physician, physical therapist or athletic trainer will help you determine which exercises will be most helpful to resolve your low back pain. Do not complete any exercises without first consulting with your clinician. Discontinue any exercises which worsen your symptoms until you speak to your clinician. If you have pain, numbness or tingling which travels down into your buttocks, leg or foot, the goal of the therapy is for these symptoms to move closer to your back and eventually resolve. Occasionally, these leg symptoms will get better, but your low back pain may worsen; this  is typically an indication of progress in your rehabilitation. Be certain to be very alert to any changes in your symptoms and the activities in which you participated in the 24 hours prior to the change. Sharing this information with your clinician will allow him/her to most efficiently treat your condition. These exercises may help you when beginning to rehabilitate your injury. Your symptoms may resolve with or without further involvement from your physician, physical therapist or athletic trainer. While completing these exercises, remember:   Restoring tissue flexibility helps normal motion to return to the joints. This allows healthier, less painful movement and activity.  An effective stretch should be held for at least 30 seconds.  A stretch should never be painful. You should only feel a gentle lengthening or release in the stretched tissue. FLEXION RANGE OF MOTION AND STRETCHING EXERCISES: STRETCH - Flexion, Single Knee to Chest   Lie on a firm bed or floor with both legs extended in front of you.  Keeping one leg in contact with the floor, bring your opposite knee to your chest. Hold your leg in place by either grabbing behind your thigh or at your knee.  Pull until you feel a gentle stretch in your low back. Hold __________ seconds.  Slowly release your grasp and repeat the exercise with the opposite side. Repeat __________ times. Complete this exercise __________ times per day.  STRETCH - Flexion, Double Knee to Chest  Lie on a firm bed or floor with both legs extended in front of you.  Keeping one leg  in contact with the floor, bring your opposite knee to your chest.  Tense your stomach muscles to support your back and then lift your other knee to your chest. Hold your legs in place by either grabbing behind your thighs or at your knees.  Pull both knees toward your chest until you feel a gentle stretch in your low back. Hold __________ seconds.  Tense your stomach muscles  and slowly return one leg at a time to the floor. Repeat __________ times. Complete this exercise __________ times per day.  STRETCH - Low Trunk Rotation   Lie on a firm bed or floor. Keeping your legs in front of you, bend your knees so they are both pointed toward the ceiling and your feet are flat on the floor.  Extend your arms out to the side. This will stabilize your upper body by keeping your shoulders in contact with the floor.  Gently and slowly drop both knees together to one side until you feel a gentle stretch in your low back. Hold for __________ seconds.  Tense your stomach muscles to support your low back as you bring your knees back to the starting position. Repeat the exercise to the other side. Repeat __________ times. Complete this exercise __________ times per day  EXTENSION RANGE OF MOTION AND FLEXIBILITY EXERCISES: STRETCH - Extension, Prone on Elbows  Lie on your stomach on the floor, a bed will be too soft. Place your palms about shoulder width apart and at the height of your head.  Place your elbows under your shoulders. If this is too painful, stack pillows under your chest.  Allow your body to relax so that your hips drop lower and make contact more completely with the floor.  Hold this position for __________ seconds.  Slowly return to lying flat on the floor. Repeat __________ times. Complete this exercise __________ times per day.  RANGE OF MOTION - Extension, Prone Press Ups  Lie on your stomach on the floor, a bed will be too soft. Place your palms about shoulder width apart and at the height of your head.  Keeping your back as relaxed as possible, slowly straighten your elbows while keeping your hips on the floor. You may adjust the placement of your hands to maximize your comfort. As you gain motion, your hands will come more underneath your shoulders.  Hold this position __________ seconds.  Slowly return to lying flat on the floor. Repeat  __________ times. Complete this exercise __________ times per day.  STRENGTHENING EXERCISES - Sciatica  These exercises may help you when beginning to rehabilitate your injury. These exercises should be done near your "sweet spot." This is the neutral, low-back arch, somewhere between fully rounded and fully arched, that is your least painful position. When performed in this safe range of motion, these exercises can be used for people who have either a flexion or extension based injury. These exercises may resolve your symptoms with or without further involvement from your physician, physical therapist or athletic trainer. While completing these exercises, remember:   Muscles can gain both the endurance and the strength needed for everyday activities through controlled exercises.  Complete these exercises as instructed by your physician, physical therapist or athletic trainer. Progress with the resistance and repetition exercises only as your caregiver advises.  You may experience muscle soreness or fatigue, but the pain or discomfort you are trying to eliminate should never worsen during these exercises. If this pain does worsen, stop and make certain you  are following the directions exactly. If the pain is still present after adjustments, discontinue the exercise until you can discuss the trouble with your clinician. STRENGTHENING - Deep Abdominals, Pelvic Tilt   Lie on a firm bed or floor. Keeping your legs in front of you, bend your knees so they are both pointed toward the ceiling and your feet are flat on the floor.  Tense your lower abdominal muscles to press your low back into the floor. This motion will rotate your pelvis so that your tail bone is scooping upwards rather than pointing at your feet or into the floor.  With a gentle tension and even breathing, hold this position for __________ seconds. Repeat __________ times. Complete this exercise __________ times per day.  STRENGTHENING -  Abdominals, Crunches   Lie on a firm bed or floor. Keeping your legs in front of you, bend your knees so they are both pointed toward the ceiling and your feet are flat on the floor. Cross your arms over your chest.  Slightly tip your chin down without bending your neck.  Tense your abdominals and slowly lift your trunk high enough to just clear your shoulder blades. Lifting higher can put excessive stress on the low back and does not further strengthen your abdominal muscles.  Control your return to the starting position. Repeat __________ times. Complete this exercise __________ times per day.  STRENGTHENING - Quadruped, Opposite UE/LE Lift  Assume a hands and knees position on a firm surface. Keep your hands under your shoulders and your knees under your hips. You may place padding under your knees for comfort.  Find your neutral spine and gently tense your abdominal muscles so that you can maintain this position. Your shoulders and hips should form a rectangle that is parallel with the floor and is not twisted.  Keeping your trunk steady, lift your right hand no higher than your shoulder and then your left leg no higher than your hip. Make sure you are not holding your breath. Hold this position __________ seconds.  Continuing to keep your abdominal muscles tense and your back steady, slowly return to your starting position. Repeat with the opposite arm and leg. Repeat __________ times. Complete this exercise __________ times per day.  STRENGTHENING - Abdominals and Quadriceps, Straight Leg Raise   Lie on a firm bed or floor with both legs extended in front of you.  Keeping one leg in contact with the floor, bend the other knee so that your foot can rest flat on the floor.  Find your neutral spine, and tense your abdominal muscles to maintain your spinal position throughout the exercise.  Slowly lift your straight leg off the floor about 6 inches for a count of 15, making sure to not  hold your breath.  Still keeping your neutral spine, slowly lower your leg all the way to the floor. Repeat this exercise with each leg __________ times. Complete this exercise __________ times per day. POSTURE AND BODY MECHANICS CONSIDERATIONS - Sciatica Keeping correct posture when sitting, standing or completing your activities will reduce the stress put on different body tissues, allowing injured tissues a chance to heal and limiting painful experiences. The following are general guidelines for improved posture. Your physician or physical therapist will provide you with any instructions specific to your needs. While reading these guidelines, remember:  The exercises prescribed by your provider will help you have the flexibility and strength to maintain correct postures.  The correct posture provides the optimal environment  for your joints to work. All of your joints have less wear and tear when properly supported by a spine with good posture. This means you will experience a healthier, less painful body.  Correct posture must be practiced with all of your activities, especially prolonged sitting and standing. Correct posture is as important when doing repetitive low-stress activities (typing) as it is when doing a single heavy-load activity (lifting). RESTING POSITIONS Consider which positions are most painful for you when choosing a resting position. If you have pain with flexion-based activities (sitting, bending, stooping, squatting), choose a position that allows you to rest in a less flexed posture. You would want to avoid curling into a fetal position on your side. If your pain worsens with extension-based activities (prolonged standing, working overhead), avoid resting in an extended position such as sleeping on your stomach. Most people will find more comfort when they rest with their spine in a more neutral position, neither too rounded nor too arched. Lying on a non-sagging bed on your  side with a pillow between your knees, or on your back with a pillow under your knees will often provide some relief. Keep in mind, being in any one position for a prolonged period of time, no matter how correct your posture, can still lead to stiffness. PROPER SITTING POSTURE In order to minimize stress and discomfort on your spine, you must sit with correct posture Sitting with good posture should be effortless for a healthy body. Returning to good posture is a gradual process. Many people can work toward this most comfortably by using various supports until they have the flexibility and strength to maintain this posture on their own. When sitting with proper posture, your ears will fall over your shoulders and your shoulders will fall over your hips. You should use the back of the chair to support your upper back. Your low back will be in a neutral position, just slightly arched. You may place a small pillow or folded towel at the base of your low back for support.  When working at a desk, create an environment that supports good, upright posture. Without extra support, muscles fatigue and lead to excessive strain on joints and other tissues. Keep these recommendations in mind: CHAIR:   A chair should be able to slide under your desk when your back makes contact with the back of the chair. This allows you to work closely.  The chair's height should allow your eyes to be level with the upper part of your monitor and your hands to be slightly lower than your elbows. BODY POSITION  Your feet should make contact with the floor. If this is not possible, use a foot rest.  Keep your ears over your shoulders. This will reduce stress on your neck and low back. INCORRECT SITTING POSTURES   If you are feeling tired and unable to assume a healthy sitting posture, do not slouch or slump. This puts excessive strain on your back tissues, causing more damage and pain. Healthier options include:  Using more  support, like a lumbar pillow.  Switching tasks to something that requires you to be upright or walking.  Talking a brief walk.  Lying down to rest in a neutral-spine position. PROLONGED STANDING WHILE SLIGHTLY LEANING FORWARD  When completing a task that requires you to lean forward while standing in one place for a long time, place either foot up on a stationary 2-4 inch high object to help maintain the best posture. When both feet  are on the ground, the low back tends to lose its slight inward curve. If this curve flattens (or becomes too large), then the back and your other joints will experience too much stress, fatigue more quickly and can cause pain.  CORRECT STANDING POSTURES Proper standing posture should be assumed with all daily activities, even if they only take a few moments, like when brushing your teeth. As in sitting, your ears should fall over your shoulders and your shoulders should fall over your hips. You should keep a slight tension in your abdominal muscles to brace your spine. Your tailbone should point down to the ground, not behind your body, resulting in an over-extended swayback posture.  INCORRECT STANDING POSTURES  Common incorrect standing postures include a forward head, locked knees and/or an excessive swayback. WALKING Walk with an upright posture. Your ears, shoulders and hips should all line-up. PROLONGED ACTIVITY IN A FLEXED POSITION When completing a task that requires you to bend forward at your waist or lean over a low surface, try to find a way to stabilize 3 of 4 of your limbs. You can place a hand or elbow on your thigh or rest a knee on the surface you are reaching across. This will provide you more stability so that your muscles do not fatigue as quickly. By keeping your knees relaxed, or slightly bent, you will also reduce stress across your low back. CORRECT LIFTING TECHNIQUES DO :   Assume a wide stance. This will provide you more stability and the  opportunity to get as close as possible to the object which you are lifting.  Tense your abdominals to brace your spine; then bend at the knees and hips. Keeping your back locked in a neutral-spine position, lift using your leg muscles. Lift with your legs, keeping your back straight.  Test the weight of unknown objects before attempting to lift them.  Try to keep your elbows locked down at your sides in order get the best strength from your shoulders when carrying an object.  Always ask for help when lifting heavy or awkward objects. INCORRECT LIFTING TECHNIQUES DO NOT:   Lock your knees when lifting, even if it is a small object.  Bend and twist. Pivot at your feet or move your feet when needing to change directions.  Assume that you cannot safely pick up a paperclip without proper posture. Document Released: 05/24/2005 Document Revised: 10/08/2013 Document Reviewed: 09/05/2008 [facility] Patient Information 2015 [place], [place]. This information is not intended to replace advice given to you by your health care provider. Make sure you discuss any questions you have with your health care provider.

## 2015-03-06 NOTE — Progress Notes (Signed)
Pre visit review using our clinic review tool, if applicable. No additional management support is needed unless otherwise documented below in the visit note.   Does not want flu shot today

## 2015-04-09 ENCOUNTER — Telehealth: Payer: Self-pay | Admitting: Family

## 2015-04-09 ENCOUNTER — Other Ambulatory Visit: Payer: Self-pay | Admitting: Internal Medicine

## 2015-04-09 MED ORDER — LISINOPRIL-HYDROCHLOROTHIAZIDE 20-12.5 MG PO TABS: 1.0000 | ORAL_TABLET | Freq: Every day | ORAL | Status: DC

## 2015-04-09 NOTE — Telephone Encounter (Signed)
Pt states she finished with the hydrochlorothiazide (MICROZIDE) 12.5 MG capsule [[id number]] and is now ready for the combined medication with her lisinopril.  Pharmacy is [facility] on [address]

## 2015-04-09 NOTE — Telephone Encounter (Signed)
Please advise 

## 2015-04-09 NOTE — Telephone Encounter (Signed)
LVM letting pt know that rx was sent in to her pharmacy

## 2015-05-21 ENCOUNTER — Other Ambulatory Visit: Payer: Self-pay | Admitting: Family

## 2015-05-22 NOTE — Telephone Encounter (Signed)
Rx faxed by Marya Amsler...Johny Chess

## 2015-07-02 ENCOUNTER — Other Ambulatory Visit: Payer: Self-pay | Admitting: Family

## 2015-07-03 NOTE — Telephone Encounter (Signed)
Glenda Bauer,  Patient last seen for anxiety in June/16.  Please advise on Xanex request.  I will re-fill the Singulair.

## 2015-08-05 ENCOUNTER — Other Ambulatory Visit: Payer: Self-pay | Admitting: Family

## 2015-09-30 ENCOUNTER — Other Ambulatory Visit: Payer: Self-pay | Admitting: Family

## 2015-10-01 NOTE — Telephone Encounter (Signed)
Last refill was 07/03/15

## 2015-10-20 DIAGNOSIS — H9203 Otalgia, bilateral: Secondary | ICD-10-CM | POA: Diagnosis not present

## 2015-10-20 DIAGNOSIS — J339 Nasal polyp, unspecified: Secondary | ICD-10-CM | POA: Diagnosis not present

## 2015-10-20 DIAGNOSIS — Z8709 Personal history of other diseases of the respiratory system: Secondary | ICD-10-CM | POA: Diagnosis not present

## 2015-10-20 DIAGNOSIS — J0140 Acute pansinusitis, unspecified: Secondary | ICD-10-CM | POA: Diagnosis not present

## 2015-10-20 DIAGNOSIS — R0981 Nasal congestion: Secondary | ICD-10-CM | POA: Diagnosis not present

## 2015-10-30 ENCOUNTER — Encounter: Payer: Self-pay | Admitting: Family

## 2015-10-30 ENCOUNTER — Ambulatory Visit (INDEPENDENT_AMBULATORY_CARE_PROVIDER_SITE_OTHER): Payer: Medicare Other | Admitting: Family

## 2015-10-30 VITALS — BP 150/98 | HR 72 | Temp 97.5°F | Resp 18 | Ht 67.0 in | Wt 181.0 lb

## 2015-10-30 DIAGNOSIS — J0140 Acute pansinusitis, unspecified: Secondary | ICD-10-CM | POA: Diagnosis not present

## 2015-10-30 DIAGNOSIS — J4541 Moderate persistent asthma with (acute) exacerbation: Secondary | ICD-10-CM

## 2015-10-30 DIAGNOSIS — J329 Chronic sinusitis, unspecified: Secondary | ICD-10-CM | POA: Insufficient documentation

## 2015-10-30 DIAGNOSIS — J45901 Unspecified asthma with (acute) exacerbation: Secondary | ICD-10-CM | POA: Insufficient documentation

## 2015-10-30 MED ORDER — PREDNISONE 10 MG (21) PO TBPK: ORAL_TABLET | ORAL | Status: DC

## 2015-10-30 MED ORDER — LEVOFLOXACIN 500 MG PO TABS: 500.0000 mg | ORAL_TABLET | Freq: Every day | ORAL | Status: DC

## 2015-10-30 MED ORDER — FLUTICASONE FUROATE-VILANTEROL 200-25 MCG/INH IN AEPB: 1.0000 | INHALATION_SPRAY | Freq: Every day | RESPIRATORY_TRACT | Status: DC

## 2015-10-30 NOTE — Patient Instructions (Signed)
Thank you for choosing [facility].  Summary/Instructions:  Your prescription(s) have been submitted to your pharmacy or been printed and provided for you. Please take as directed and contact our office if you believe you are having problem(s) with the medication(s) or have any questions.  If your symptoms worsen or fail to improve, please contact our office for further instruction, or in case of emergency go directly to the emergency room at the closest medical facility.   General Recommendations:    Please drink plenty of fluids.  Get plenty of rest   Sleep in humidified air  Use saline nasal sprays  Netti pot   OTC Medications:  Decongestants - helps relieve congestion   Flonase (generic fluticasone) or Nasacort (generic triamcinolone) - please make sure to use the "cross-over" technique at a 45 degree angle towards the opposite eye as opposed to straight up the nasal passageway.   Sudafed (generic pseudoephedrine - Note this is the one that is available behind the pharmacy counter); Products with phenylephrine (-PE) may also be used but is often not as effective as pseudoephedrine.   If you have HIGH BLOOD PRESSURE - Coricidin HBP; AVOID any product that is -D as this contains pseudoephedrine which may increase your blood pressure.  Afrin (oxymetazoline) every 6-8 hours for up to 3 days.   Allergies - helps relieve runny nose, itchy eyes and sneezing   Claritin (generic loratidine), Allegra (fexofenidine), or Zyrtec (generic cyrterizine) for runny nose. These medications should not cause drowsiness.  Note - Benadryl (generic diphenhydramine) may be used however may cause drowsiness  Cough -   Delsym or Robitussin (generic dextromethorphan)  Expectorants - helps loosen mucus to ease removal   Mucinex (generic guaifenesin) as directed on the package.  Headaches / General Aches   Tylenol (generic acetaminophen) - DO NOT EXCEED 3 grams (3,000 mg) in a 24  hour time period  Advil/Motrin (generic ibuprofen)   Sore Throat -   Salt water gargle   Chloraseptic (generic benzocaine) spray or lozenges / Sucrets (generic dyclonine)    Sinusitis Sinusitis is redness, soreness, and inflammation of the paranasal sinuses. Paranasal sinuses are air pockets within the bones of your face (beneath the eyes, the middle of the forehead, or above the eyes). In healthy paranasal sinuses, mucus is able to drain out, and air is able to circulate through them by way of your nose. However, when your paranasal sinuses are inflamed, mucus and air can become trapped. This can allow bacteria and other germs to grow and cause infection. Sinusitis can develop quickly and last only a short time (acute) or continue over a long period (chronic). Sinusitis that lasts for more than 12 weeks is considered chronic.  CAUSES  Causes of sinusitis include:  Allergies.  Structural abnormalities, such as displacement of the cartilage that separates your nostrils (deviated septum), which can decrease the air flow through your nose and sinuses and affect sinus drainage.  Functional abnormalities, such as when the small hairs (cilia) that line your sinuses and help remove mucus do not work properly or are not present. SIGNS AND SYMPTOMS  Symptoms of acute and chronic sinusitis are the same. The primary symptoms are pain and pressure around the affected sinuses. Other symptoms include:  Upper toothache.  Earache.  Headache.  Bad breath.  Decreased sense of smell and taste.  A cough, which worsens when you are lying flat.  Fatigue.  Fever.  Thick drainage from your nose, which often is green and may   contain pus (purulent).  Swelling and warmth over the affected sinuses. DIAGNOSIS  Your health care provider will perform a physical exam. During the exam, your health care provider may:  Look in your nose for signs of abnormal growths in your nostrils (nasal  polyps).  Tap over the affected sinus to check for signs of infection.  View the inside of your sinuses (endoscopy) using an imaging device that has a light attached (endoscope). If your health care provider suspects that you have chronic sinusitis, one or more of the following tests may be recommended:  Allergy tests.  Nasal culture. A sample of mucus is taken from your nose, sent to a lab, and screened for bacteria.  Nasal cytology. A sample of mucus is taken from your nose and examined by your health care provider to determine if your sinusitis is related to an allergy. TREATMENT  Most cases of acute sinusitis are related to a viral infection and will resolve on their own within 10 days. Sometimes medicines are prescribed to help relieve symptoms (pain medicine, decongestants, nasal steroid sprays, or saline sprays).  However, for sinusitis related to a bacterial infection, your health care provider will prescribe antibiotic medicines. These are medicines that will help kill the bacteria causing the infection.  Rarely, sinusitis is caused by a fungal infection. In theses cases, your health care provider will prescribe antifungal medicine. For some cases of chronic sinusitis, surgery is needed. Generally, these are cases in which sinusitis recurs more than 3 times per year, despite other treatments. HOME CARE INSTRUCTIONS   Drink plenty of water. Water helps thin the mucus so your sinuses can drain more easily.  Use a humidifier.  Inhale steam 3 to 4 times a day (for example, sit in the bathroom with the shower running).  Apply a warm, moist washcloth to your face 3 to 4 times a day, or as directed by your health care provider.  Use saline nasal sprays to help moisten and clean your sinuses.  Take medicines only as directed by your health care provider.  If you were prescribed either an antibiotic or antifungal medicine, finish it all even if you start to feel better. SEEK IMMEDIATE  MEDICAL CARE IF:  You have increasing pain or severe headaches.  You have nausea, vomiting, or drowsiness.  You have swelling around your face.  You have vision problems.  You have a stiff neck.  You have difficulty breathing. MAKE SURE YOU:   Understand these instructions.  Will watch your condition.  Will get help right away if you are not doing well or get worse. Document Released: 05/24/2005 Document Revised: 10/08/2013 Document Reviewed: 06/08/2011 Exit[employer] Patient Information 2015 [employer]. This information is not intended to replace advice given to you by your health care provider. Make sure you discuss any questions you have with your health care provider.   

## 2015-10-30 NOTE — Progress Notes (Signed)
Pre visit review using our clinic review tool, if applicable. No additional management support is needed unless otherwise documented below in the visit note. 

## 2015-10-30 NOTE — Assessment & Plan Note (Signed)
Symptoms and exam consistent with bacterial sinusitis refractory to Omnicef and prednisone. Is some concern for underlying bronchitis however given sinus pressure and congestion unlikely. Start levofloxacin. Start prednisone. Continue over-the-counter medications for symptom relief and supportive care. Follow-up for symptom worsening.

## 2015-10-30 NOTE — Assessment & Plan Note (Addendum)
Symptoms and exam consistent with exacerbation of asthma secondary to sinusitis. Continue albuterol as needed. Hold Advair. Sample of Breo provided. Start prednisone taper if needed. Continue current dosage of fexofenadine and montelukast. Follow up if symptoms worsen or do not improve.

## 2015-10-30 NOTE — Progress Notes (Signed)
Subjective:    Patient ID: Glenda Bauer, female    DOB: Mar 27, 1951, 65 y.o.   MRN: [id number]  Chief Complaint  Patient presents with  . Nasal Congestion    was seen at another clinic for sinus issues, congestion and etc, now has congestion in her chest and wheezing, and cough    HPI:  Glenda Bauer is a 65 y.o. 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 and presents today for an acute office visit.   This is a new problem. Associated symptom of sinus congestion, ear pain, sinues pressure, coughing, wheezing has been going on for 4 weeks total. She was treated Cefdinir and prednisone which helped a little. She continues to have ear pain, congestion, and cough. Has also had to use her albuterol several times per day with continued need for wheezing. Denies fevers. Course of the symptoms with some improvement with antibiotic therapy although some worsening.   No Known Allergies   Current Outpatient Prescriptions on File Prior to Visit  Medication Sig Dispense Refill  . acetaminophen-codeine (TYLENOL #3) 300-30 MG per tablet Take 1 tablet by mouth every 4 (four) hours as needed for moderate pain. 40 tablet 0  . ADVAIR DISKUS 100-50 MCG/DOSE AEPB INHALE 1 PUFF INTO THE LUNGS TWICE DAILY 60 each 3  . albuterol (PROVENTIL) (5 MG/ML) 0.5% nebulizer solution Take 0.5 mLs (2.5 mg total) by nebulization every 6 (six) hours as needed for wheezing or shortness of breath. 20 mL 0  . albuterol (VENTOLIN HFA) 108 (90 BASE) MCG/ACT inhaler INHALE 1-2 PUFFS INTO THE LUNGS EVERY 6 HOURS AS NEEDED FOR WHEEZING OR SHORTNESS OF BREATH 18 g 1  . ALPRAZolam (XANAX) 0.25 MG tablet TAKE 1 TO 2 TABLETS BY MOUTH DAILY AS NEEDED FOR ANXIETY 20 tablet 0  . citalopram (CELEXA) 10 MG tablet TAKE 1 TABLET BY MOUTH EVERY DAY 30 tablet 11  . fexofenadine  (ALLEGRA) 30 MG tablet Take 30 mg by mouth 2 (two) times daily.    Marland Kitchen lisinopril-hydrochlorothiazide (ZESTORETIC) 20-12.5 MG tablet Take 1 tablet by mouth daily. 90 tablet 1  . montelukast (SINGULAIR) 10 MG tablet TAKE 1 TABLET BY MOUTH AT BEDTIME 90 tablet 0  . triamcinolone (NASACORT ALLERGY 24HR) 55 MCG/ACT AERO nasal inhaler Place 2 sprays into the nose daily.    Marland Kitchen triamcinolone cream (KENALOG) 0.1 % Apply 1 application topically 2 (two) times daily. 453.6 g 1   No current facility-administered medications on file prior to visit.     Past Surgical History  Procedure Laterality Date  . Cholecystectomy  2015  . Melenoma removal  1979    Past Medical History  Diagnosis Date  . Asthma   . Cancer (HCC)     Melanoma and Mycosis Fungosis  . Depression   . Heart murmur   . Anxiety   . Allergy   . Hypertension   . Urinary tract infection   . Cutaneous T-cell lymphoma [facility]) 2011    Dermatologist treated in [place]; Dr. Hipolito Bayley     Review of Systems  Constitutional: Negative for fever and chills.  HENT: Positive for congestion, ear pain and sinus pressure. Negative for sore throat.   Respiratory: Positive for cough and wheezing. Negative for chest tightness.   Neurological: Negative for headaches.      Objective:    BP 150/98 mmHg  Pulse 72  Temp(Src) 97.5 F (  36.4 C) (Oral)  Resp 18  Ht 5\' 7"  (1.702 m)  Wt 181 lb (82.101 kg)  BMI 28.34 kg/m2  SpO2 97% Nursing note and vital signs reviewed.  Physical Exam  Constitutional: She is oriented to person, place, and time. She appears well-developed and well-nourished. No distress.  HENT:  Right Ear: Hearing, tympanic membrane, external ear and ear canal normal.  Left Ear: Hearing, tympanic membrane, external ear and ear canal normal.  Nose: Right sinus exhibits maxillary sinus tenderness and frontal sinus tenderness. Left sinus exhibits maxillary sinus tenderness and frontal sinus tenderness.  Mouth/Throat: Uvula is  midline, oropharynx is clear and moist and mucous membranes are normal.  Neck: Neck supple.  Cardiovascular: Normal rate, regular rhythm, normal heart sounds and intact distal pulses.   Pulmonary/Chest: Effort normal. She has wheezes.  Neurological: She is alert and oriented to person, place, and time.  Skin: Skin is warm and dry.  Psychiatric: She has a normal mood and affect. Her behavior is normal. Judgment and thought content normal.       Assessment & Plan:   Problem List Items Addressed This Visit      Respiratory   Sinusitis - Primary    Symptoms and exam consistent with bacterial sinusitis refractory to Omnicef and prednisone. Is some concern for underlying bronchitis however given sinus pressure and congestion unlikely. Start levofloxacin. Start prednisone. Continue over-the-counter medications for symptom relief and supportive care. Follow-up for symptom worsening.      Relevant Medications   levofloxacin (LEVAQUIN) 500 MG tablet   predniSONE (STERAPRED UNI-PAK 21 TAB) 10 MG (21) TBPK tablet   fluticasone furoate-vilanterol (BREO ELLIPTA) 200-25 MCG/INH AEPB   Asthma with exacerbation    Symptoms and exam consistent with exacerbation of asthma secondary to sinusitis. Continue albuterol as needed. Hold Advair. Sample of Breo provided. Start prednisone taper if needed. Continue current dosage of fexofenadine and montelukast. Follow up if symptoms worsen or do not improve.       Relevant Medications   predniSONE (STERAPRED UNI-PAK 21 TAB) 10 MG (21) TBPK tablet   fluticasone furoate-vilanterol (BREO ELLIPTA) 200-25 MCG/INH AEPB       I have discontinued Ms. Clemon's predniSONE. I am also having her start on levofloxacin, predniSONE, and fluticasone furoate-vilanterol. Additionally, I am having her maintain her triamcinolone, albuterol, citalopram, albuterol, fexofenadine, triamcinolone cream, acetaminophen-codeine, lisinopril-hydrochlorothiazide, montelukast, ADVAIR  DISKUS, and ALPRAZolam.   Meds ordered this encounter  Medications  . levofloxacin (LEVAQUIN) 500 MG tablet    Sig: Take 1 tablet (500 mg total) by mouth daily.    Dispense:  7 tablet    Refill:  0    Order Specific Question:  Supervising Provider    Answer:  Pricilla Holm A [id number]  . predniSONE (STERAPRED UNI-PAK 21 TAB) 10 MG (21) TBPK tablet    Sig: Take 6 tablets x 1 day, 5 tablets x 1 day, 4 tablets x 1 day, 3 tablets x 1 day, 2 tablets x 1 day, 1 tablet x 1 day    Dispense:  21 tablet    Refill:  0    Order Specific Question:  Supervising Provider    Answer:  Pricilla Holm A [id number]  . fluticasone furoate-vilanterol (BREO ELLIPTA) 200-25 MCG/INH AEPB    Sig: Inhale 1 puff into the lungs daily.    Dispense:  14 each    Refill:  0    Order Specific Question:  Supervising Provider    Answer:  Pricilla Holm A [id number]  Follow-up: Return if symptoms worsen or fail to improve.  Mauricio Po, FNP

## 2015-11-13 ENCOUNTER — Other Ambulatory Visit: Payer: Self-pay | Admitting: Family

## 2015-11-16 ENCOUNTER — Other Ambulatory Visit: Payer: Self-pay | Admitting: Family

## 2015-11-17 NOTE — Telephone Encounter (Signed)
Last refill was 10/01/15

## 2015-12-11 ENCOUNTER — Other Ambulatory Visit: Payer: Self-pay | Admitting: Family

## 2015-12-22 ENCOUNTER — Other Ambulatory Visit: Payer: Self-pay | Admitting: Family

## 2015-12-23 NOTE — Telephone Encounter (Signed)
Xanax Rx faxed to pharmacy

## 2015-12-23 NOTE — Telephone Encounter (Signed)
Faxed script back to [facility].../lmb 

## 2015-12-25 ENCOUNTER — Telehealth: Payer: Self-pay | Admitting: Emergency Medicine

## 2015-12-25 DIAGNOSIS — J4541 Moderate persistent asthma with (acute) exacerbation: Secondary | ICD-10-CM

## 2015-12-25 DIAGNOSIS — J0140 Acute pansinusitis, unspecified: Secondary | ICD-10-CM

## 2015-12-25 MED ORDER — FLUTICASONE FUROATE-VILANTEROL 200-25 MCG/INH IN AEPB
1.0000 | INHALATION_SPRAY | Freq: Every day | RESPIRATORY_TRACT | Status: DC
Start: 2015-12-25 — End: 2016-01-08

## 2015-12-25 NOTE — Telephone Encounter (Signed)
Pt called and stated the samples of Breoellitta worked so she wanted to know if she could get a prescription for it. Pharmacy is [facility].  Please follow up thanks.

## 2015-12-25 NOTE — Telephone Encounter (Signed)
Rx sent to [facility]../lmb 

## 2016-01-08 ENCOUNTER — Encounter: Payer: Self-pay | Admitting: Family

## 2016-01-08 ENCOUNTER — Ambulatory Visit (INDEPENDENT_AMBULATORY_CARE_PROVIDER_SITE_OTHER): Payer: Medicare Other | Admitting: Family

## 2016-01-08 DIAGNOSIS — J4540 Moderate persistent asthma, uncomplicated: Secondary | ICD-10-CM | POA: Diagnosis not present

## 2016-01-08 DIAGNOSIS — J0140 Acute pansinusitis, unspecified: Secondary | ICD-10-CM

## 2016-01-08 MED ORDER — FLUTICASONE FUROATE-VILANTEROL 200-25 MCG/INH IN AEPB: 1.0000 | INHALATION_SPRAY | Freq: Every day | RESPIRATORY_TRACT | 2 refills | Status: DC

## 2016-01-08 MED ORDER — PREDNISONE 10 MG (21) PO TBPK: ORAL_TABLET | ORAL | 0 refills | Status: DC

## 2016-01-08 MED ORDER — DOXYCYCLINE HYCLATE 100 MG PO TABS: 100.0000 mg | ORAL_TABLET | Freq: Two times a day (BID) | ORAL | 0 refills | Status: DC

## 2016-01-08 NOTE — Progress Notes (Signed)
Subjective:    Patient ID: Glenda Bauer, female    DOB: 05/04/1951, 65 y.o.   MRN: [id number]  Chief Complaint  Patient presents with  . Sinusitis    HPI:  Glenda Bauer is a 65 y.o. 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 and presents today for an acute office visit.  This is a new problem. Associated symptom sinus pressure, congestion, headaches, and occsasional fever at night has been going on for about 1 week. Symptoms have worsened since initial onset. T-max was 100.8 with the last fever being last evening. Modifying factors include cold/warm compress, nasal lavage, Mucinex, vitamins which have not helped very much. Completed a course of Levaquin about 2 months ago for sinusisits.   No Known Allergies   Current Outpatient Prescriptions on File Prior to Visit  Medication Sig Dispense Refill  . acetaminophen-codeine (TYLENOL #3) 300-30 MG per tablet Take 1 tablet by mouth every 4 (four) hours as needed for moderate pain. 40 tablet 0  . ADVAIR DISKUS 100-50 MCG/DOSE AEPB INHALE 1 PUFF INTO THE LUNGS TWICE DAILY 60 each 3  . albuterol (PROVENTIL) (5 MG/ML) 0.5% nebulizer solution Take 0.5 mLs (2.5 mg total) by nebulization every 6 (six) hours as needed for wheezing or shortness of breath. 20 mL 0  . albuterol (VENTOLIN HFA) 108 (90 BASE) MCG/ACT inhaler INHALE 1-2 PUFFS INTO THE LUNGS EVERY 6 HOURS AS NEEDED FOR WHEEZING OR SHORTNESS OF BREATH 18 g 1  . ALPRAZolam (XANAX) 0.25 MG tablet TAKE 1-2 TABLETS BY MOUTH DAILY AS NEEDED FOR ANXIETY 30 tablet 0  . citalopram (CELEXA) 10 MG tablet TAKE 1 TABLET BY MOUTH EVERY DAY 30 tablet 0  . fexofenadine (ALLEGRA) 30 MG tablet Take 30 mg by mouth 2 (two) times daily.    Marland Kitchen lisinopril-hydrochlorothiazide (PRINZIDE,ZESTORETIC) 20-12.5 MG tablet TAKE 1 TABLET BY MOUTH DAILY 90 tablet 1    . montelukast (SINGULAIR) 10 MG tablet TAKE 1 TABLET BY MOUTH AT BEDTIME 90 tablet 1  . PROAIR HFA 108 (90 Base) MCG/ACT inhaler INHALE 1 TO 2 PUFFS BY MOUTH EVERY 6 HOURS AS NEEDED FOR WHEEZING OR SHORTNESS OF BREATH 8.5 g 1  . triamcinolone (NASACORT ALLERGY 24HR) 55 MCG/ACT AERO nasal inhaler Place 2 sprays into the nose daily.    Marland Kitchen triamcinolone cream (KENALOG) 0.1 % Apply 1 application topically 2 (two) times daily. 453.6 g 1   No current facility-administered medications on file prior to visit.      Past Surgical History:  Procedure Laterality Date  . CHOLECYSTECTOMY  2015  . melenoma removal  1979     Review of Systems  Constitutional: Positive for fatigue and fever. Negative for chills.  HENT: Positive for congestion, sinus pressure and sore throat.   Respiratory: Positive for cough. Negative for chest tightness, shortness of breath and wheezing.   Neurological: Positive for headaches.      Objective:    BP 128/84 (BP Location: Left Arm, Patient Position: Sitting, Cuff Size: Normal)   Pulse 81   Temp 98.1 F (36.7 C) (Oral)   Ht 5\' 7"  (1.702 m)   Wt 178 lb 8 oz (81 kg)   SpO2 95%   BMI 27.96 kg/m  Nursing note and vital signs reviewed.  Physical Exam  Constitutional: She is oriented to person, place, and time. She appears well-developed and well-nourished.  HENT:  Right Ear: Hearing, tympanic  membrane, external ear and ear canal normal.  Left Ear: Hearing, tympanic membrane, external ear and ear canal normal.  Nose: Right sinus exhibits maxillary sinus tenderness and frontal sinus tenderness. Left sinus exhibits maxillary sinus tenderness and frontal sinus tenderness.  Mouth/Throat: Uvula is midline, oropharynx is clear and moist and mucous membranes are normal.  Neck: Neck supple.  Cardiovascular: Normal rate, regular rhythm, normal heart sounds and intact distal pulses.   Pulmonary/Chest: Effort normal and breath sounds normal.  Neurological: She is alert and  oriented to person, place, and time.  Skin: Skin is warm and dry.       Assessment & Plan:   Problem List Items Addressed This Visit      Respiratory   Sinusitis    Symptoms and exam consistent with bacterial sinusitis. Start doxycycline. Start prednisone Dosepak for inflammation. Continue over-the-counter medications as needed for symptom relief and supportive care. Follow-up if symptoms worsen or do not improve.       Relevant Medications   predniSONE (STERAPRED UNI-PAK 21 TAB) 10 MG (21) TBPK tablet   doxycycline (VIBRA-TABS) 100 MG tablet   Asthma with exacerbation   Relevant Medications   predniSONE (STERAPRED UNI-PAK 21 TAB) 10 MG (21) TBPK tablet   fluticasone furoate-vilanterol (BREO ELLIPTA) 200-25 MCG/INH AEPB    Other Visit Diagnoses   None.      I have discontinued Ms. Jeffcoat's levofloxacin. I am also having her start on doxycycline. Additionally, I am having her maintain her triamcinolone, albuterol, albuterol, fexofenadine, triamcinolone cream, acetaminophen-codeine, ADVAIR DISKUS, citalopram, lisinopril-hydrochlorothiazide, ALPRAZolam, PROAIR HFA, montelukast, predniSONE, and fluticasone furoate-vilanterol.   Meds ordered this encounter  Medications  . predniSONE (STERAPRED UNI-PAK 21 TAB) 10 MG (21) TBPK tablet    Sig: Take 6 tablets x 1 day, 5 tablets x 1 day, 4 tablets x 1 day, 3 tablets x 1 day, 2 tablets x 1 day, 1 tablet x 1 day    Dispense:  21 tablet    Refill:  0    Order Specific Question:   Supervising Provider    Answer:   Pricilla Holm A [id number]  . fluticasone furoate-vilanterol (BREO ELLIPTA) 200-25 MCG/INH AEPB    Sig: Inhale 1 puff into the lungs daily.    Dispense:  60 each    Refill:  2    Order Specific Question:   Supervising Provider    Answer:   Pricilla Holm A [id number]  . doxycycline (VIBRA-TABS) 100 MG tablet    Sig: Take 1 tablet (100 mg total) by mouth 2 (two) times daily.    Dispense:  20 tablet    Refill:  0     Order Specific Question:   Supervising Provider    Answer:   Pricilla Holm A [id number]     Follow-up: Return if symptoms worsen or fail to improve.  Mauricio Po, FNP

## 2016-01-08 NOTE — Patient Instructions (Addendum)
Thank you for choosing [facility].  Summary/Instructions:  Your prescription(s) have been submitted to your pharmacy or been printed and provided for you. Please take as directed and contact our office if you believe you are having problem(s) with the medication(s) or have any questions.  Please stop by the lab on the lower level of the building for your blood work. Your results will be released to [place] (or called to you) after review, usually within 72 hours after test completion. If any changes need to be made, you will be notified at that same time.  1. The lab is open from 7:30am to 5:30 pm Monday-Friday  2. No appointment is necessary  3. Fasting (if needed) is 6-8 hours after food and drink; black  coffee and water are okay   If your symptoms worsen or fail to improve, please contact our office for further instruction, or in case of emergency go directly to the emergency room at the closest medical facility.     General Recommendations:    Please drink plenty of fluids.  Get plenty of rest   Sleep in humidified air  Use saline nasal sprays  Netti pot   OTC Medications:  Decongestants - helps relieve congestion   Flonase (generic fluticasone) or Nasacort (generic triamcinolone) - please make sure to use the "cross-over" technique at a 45 degree angle towards the opposite eye as opposed to straight up the nasal passageway.   Sudafed (generic pseudoephedrine - Note this is the one that is available behind the pharmacy counter); Products with phenylephrine (-PE) may also be used but is often not as effective as pseudoephedrine.   If you have HIGH BLOOD PRESSURE - Coricidin HBP; AVOID any product that is -D as this contains pseudoephedrine which may increase your blood pressure.  Afrin (oxymetazoline) every 6-8 hours for up to 3 days.   Allergies - helps relieve runny nose, itchy eyes and sneezing   Claritin (generic loratidine), Allegra (fexofenidine), or  Zyrtec (generic cyrterizine) for runny nose. These medications should not cause drowsiness.  Note - Benadryl (generic diphenhydramine) may be used however may cause drowsiness  Cough -   Delsym or Robitussin (generic dextromethorphan)  Expectorants - helps loosen mucus to ease removal   Mucinex (generic guaifenesin) as directed on the package.  Headaches / General Aches   Tylenol (generic acetaminophen) - DO NOT EXCEED 3 grams (3,000 mg) in a 24 hour time period  Advil/Motrin (generic ibuprofen)   Sore Throat -   Salt water gargle   Chloraseptic (generic benzocaine) spray or lozenges / Sucrets (generic dyclonine)    Sinusitis Sinusitis is redness, soreness, and inflammation of the paranasal sinuses. Paranasal sinuses are air pockets within the bones of your face (beneath the eyes, the middle of the forehead, or above the eyes). In healthy paranasal sinuses, mucus is able to drain out, and air is able to circulate through them by way of your nose. However, when your paranasal sinuses are inflamed, mucus and air can become trapped. This can allow bacteria and other germs to grow and cause infection. Sinusitis can develop quickly and last only a short time (acute) or continue over a long period (chronic). Sinusitis that lasts for more than 12 weeks is considered chronic.  CAUSES  Causes of sinusitis include:  Allergies.  Structural abnormalities, such as displacement of the cartilage that separates your nostrils (deviated septum), which can decrease the air flow through your nose and sinuses and affect sinus drainage.  Functional abnormalities, such  as when the small hairs (cilia) that line your sinuses and help remove mucus do not work properly or are not present. SIGNS AND SYMPTOMS  Symptoms of acute and chronic sinusitis are the same. The primary symptoms are pain and pressure around the affected sinuses. Other symptoms include:  Upper  toothache.  Earache.  Headache.  Bad breath.  Decreased sense of smell and taste.  A cough, which worsens when you are lying flat.  Fatigue.  Fever.  Thick drainage from your nose, which often is green and may contain pus (purulent).  Swelling and warmth over the affected sinuses. DIAGNOSIS  Your health care provider will perform a physical exam. During the exam, your health care provider may:  Look in your nose for signs of abnormal growths in your nostrils (nasal polyps).  Tap over the affected sinus to check for signs of infection.  View the inside of your sinuses (endoscopy) using an imaging device that has a light attached (endoscope). If your health care provider suspects that you have chronic sinusitis, one or more of the following tests may be recommended:  Allergy tests.  Nasal culture. A sample of mucus is taken from your nose, sent to a lab, and screened for bacteria.  Nasal cytology. A sample of mucus is taken from your nose and examined by your health care provider to determine if your sinusitis is related to an allergy. TREATMENT  Most cases of acute sinusitis are related to a viral infection and will resolve on their own within 10 days. Sometimes medicines are prescribed to help relieve symptoms (pain medicine, decongestants, nasal steroid sprays, or saline sprays).  However, for sinusitis related to a bacterial infection, your health care provider will prescribe antibiotic medicines. These are medicines that will help kill the bacteria causing the infection.  Rarely, sinusitis is caused by a fungal infection. In theses cases, your health care provider will prescribe antifungal medicine. For some cases of chronic sinusitis, surgery is needed. Generally, these are cases in which sinusitis recurs more than 3 times per year, despite other treatments. HOME CARE INSTRUCTIONS   Drink plenty of water. Water helps thin the mucus so your sinuses can drain more  easily.  Use a humidifier.  Inhale steam 3 to 4 times a day (for example, sit in the bathroom with the shower running).  Apply a warm, moist washcloth to your face 3 to 4 times a day, or as directed by your health care provider.  Use saline nasal sprays to help moisten and clean your sinuses.  Take medicines only as directed by your health care provider.  If you were prescribed either an antibiotic or antifungal medicine, finish it all even if you start to feel better. SEEK IMMEDIATE MEDICAL CARE IF:  You have increasing pain or severe headaches.  You have nausea, vomiting, or drowsiness.  You have swelling around your face.  You have vision problems.  You have a stiff neck.  You have difficulty breathing. MAKE SURE YOU:   Understand these instructions.  Will watch your condition.  Will get help right away if you are not doing well or get worse. Document Released: 05/24/2005 Document Revised: 10/08/2013 Document Reviewed: 06/08/2011 [facility] Patient Information 2015 [place], [place]. This information is not intended to replace advice given to you by your health care provider. Make sure you discuss any questions you have with your health care provider.

## 2016-01-08 NOTE — Progress Notes (Signed)
Pre visit review using our clinic review tool, if applicable. No additional management support is needed unless otherwise documented below in the visit note. 

## 2016-01-08 NOTE — Assessment & Plan Note (Signed)
Symptoms and exam consistent with bacterial sinusitis. Start doxycycline. Start prednisone Dosepak for inflammation. Continue over-the-counter medications as needed for symptom relief and supportive care. Follow-up if symptoms worsen or do not improve.

## 2016-01-15 ENCOUNTER — Other Ambulatory Visit: Payer: Self-pay | Admitting: Family

## 2016-01-23 ENCOUNTER — Telehealth: Payer: Self-pay | Admitting: Emergency Medicine

## 2016-01-23 DIAGNOSIS — J4540 Moderate persistent asthma, uncomplicated: Secondary | ICD-10-CM

## 2016-01-23 MED ORDER — FLUTICASONE FUROATE-VILANTEROL 200-25 MCG/INH IN AEPB
1.0000 | INHALATION_SPRAY | Freq: Every day | RESPIRATORY_TRACT | 2 refills | Status: DC
Start: 2016-01-23 — End: 2016-03-02

## 2016-01-23 NOTE — Telephone Encounter (Signed)
LVM letting pt know that her rx was sent and there was no labs that needed to be done that I could see.

## 2016-01-23 NOTE — Telephone Encounter (Signed)
Pt is calling about her inhaler Breo-ellitta. She stated the pharmacy has not received a prescription for it. Pharmacy is [facility]. She also asked if she needed to have blood work after her last appointment because she didn't go to lab but her paper work stated she was suppose to. Please follow up thanks.

## 2016-01-29 ENCOUNTER — Telehealth: Payer: Self-pay | Admitting: Family

## 2016-01-29 MED ORDER — LEVOFLOXACIN 500 MG PO TABS: 500.0000 mg | ORAL_TABLET | Freq: Every day | ORAL | 0 refills | Status: DC

## 2016-01-29 NOTE — Telephone Encounter (Signed)
Pt called and said that she still has the sinus infection and she wants to know if she has to come back in or can there be something else called in for her?  She is also having trouble getting one of her meds filled.  Can call her when you get a chance?    Best number [phone]

## 2016-01-29 NOTE — Telephone Encounter (Signed)
Levofloxacin sent to pharmacy

## 2016-02-04 ENCOUNTER — Other Ambulatory Visit: Payer: Self-pay | Admitting: Family

## 2016-02-05 NOTE — Telephone Encounter (Signed)
Faxed script back to [facility].../lmb 

## 2016-02-13 ENCOUNTER — Ambulatory Visit (INDEPENDENT_AMBULATORY_CARE_PROVIDER_SITE_OTHER): Payer: Medicare Other | Admitting: Family

## 2016-02-13 ENCOUNTER — Other Ambulatory Visit: Payer: Self-pay | Admitting: Family

## 2016-02-13 ENCOUNTER — Encounter: Payer: Self-pay | Admitting: Family

## 2016-02-13 VITALS — BP 160/96 | HR 78 | Temp 98.2°F | Resp 14 | Ht 67.0 in | Wt 182.0 lb

## 2016-02-13 DIAGNOSIS — Z23 Encounter for immunization: Secondary | ICD-10-CM | POA: Diagnosis not present

## 2016-02-13 DIAGNOSIS — M25862 Other specified joint disorders, left knee: Secondary | ICD-10-CM | POA: Diagnosis not present

## 2016-02-13 DIAGNOSIS — M25869 Other specified joint disorders, unspecified knee: Secondary | ICD-10-CM | POA: Insufficient documentation

## 2016-02-13 NOTE — Assessment & Plan Note (Signed)
Symptoms and exam consistent with cyst/effusion located in the posterior aspect of the left knee which was drained under ultrasound guidance without complication and tolerated well by patient. The site was injected with cortisone to prevent further cyst formation. Patient provided post-care instructions advised to follow-up for signs of infection.

## 2016-02-13 NOTE — Patient Instructions (Signed)
Thank you for choosing [facility].  SUMMARY AND INSTRUCTIONS:  Take it easy for the next 24 hours.  Recommend ice as needed.   Monitor for signs of infection.   Follow up if symptoms worsen.    Follow up:  If your symptoms worsen or fail to improve, please contact our office for further instruction, or in case of emergency go directly to the emergency room at the closest medical facility.   Baker Cyst A Baker cyst is a sac-like structure that forms in the back of the knee. It is filled with the same fluid that is located in your knee. This fluid lubricates the bones and cartilage of the knee and allows them to move over each other more easily. CAUSES  When the knee becomes injured or inflamed, increased fluid forms in the knee. When this happens, the joint lining is pushed out behind the knee and forms the Baker cyst. This cyst may also be caused by inflammation from arthritic conditions and infections. SIGNS AND SYMPTOMS  A Baker cyst usually has no symptoms. When the cyst is substantially enlarged:  You may feel pressure behind the knee, stiffness in the knee, or a mass in the area behind the knee.  You may develop pain, redness, and swelling in the calf. This can suggest a blood clot and requires evaluation by your health care provider. DIAGNOSIS  A Baker cyst is most often found during an ultrasound exam. This exam may have been performed for other reasons, and the cyst was found incidentally. Sometimes an MRI is used. This picks up other problems within a joint that an ultrasound exam may not. If the Baker cyst developed immediately after an injury, X-ray exams may be used to diagnose the cyst. TREATMENT  The treatment depends on the cause of the cyst. Anti-inflammatory medicines and rest often will be prescribed. If the cyst is caused by a bacterial infection, antibiotic medicines may be prescribed.  HOME CARE INSTRUCTIONS   If the cyst was caused by an injury, for the  first 24 hours, keep the injured leg elevated on 2 pillows while lying down.  For the first 24 hours while you are awake, apply ice to the injured area:  Put ice in a plastic bag.  Place a towel between your skin and the bag.  Leave the ice on for 20 minutes, 2-3 times a day.  Only take over-the-counter or prescription medicines for pain, discomfort, or fever as directed by your health care provider.  Only take antibiotic medicine as directed. Make sure to finish it even if you start to feel better. MAKE SURE YOU:   Understand these instructions.  Will watch your condition.  Will get help right away if you are not doing well or get worse.   This information is not intended to replace advice given to you by your health care provider. Make sure you discuss any questions you have with your health care provider.   Document Released: 05/24/2005 Document Revised: 03/14/2013 Document Reviewed: 01/03/2013 Elsevier Interactive Patient Education [employer].

## 2016-02-13 NOTE — Progress Notes (Signed)
Subjective:    Patient ID: Glenda Bauer, female    DOB: August 07, 1950, 65 y.o.   MRN: [id number]  Chief Complaint  Patient presents with  . Mass    x2 weeks ago noticed a mass behind left knee, starting to get pain in her knee from it    HPI:  Glenda Bauer is a 65 y.o. 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 and presents today for an office visit.   This is a new problem. Associated symptom of a mass located behind her left knee that has been going on for about 2 weeks. There is some discomfort started to develop described as achy. Symptoms are generally worsening. Modifying factors include ibuprofen and ice which did not help very much. No previous history of knee injury. No fevers.   No Known Allergies    Outpatient Medications Prior to Visit  Medication Sig Dispense Refill  . acetaminophen-codeine (TYLENOL #3) 300-30 MG per tablet Take 1 tablet by mouth every 4 (four) hours as needed for moderate pain. 40 tablet 0  . ADVAIR DISKUS 100-50 MCG/DOSE AEPB INHALE 1 PUFF INTO THE LUNGS TWICE DAILY 60 each 3  . albuterol (PROVENTIL) (5 MG/ML) 0.5% nebulizer solution Take 0.5 mLs (2.5 mg total) by nebulization every 6 (six) hours as needed for wheezing or shortness of breath. 20 mL 0  . albuterol (VENTOLIN HFA) 108 (90 BASE) MCG/ACT inhaler INHALE 1-2 PUFFS INTO THE LUNGS EVERY 6 HOURS AS NEEDED FOR WHEEZING OR SHORTNESS OF BREATH 18 g 1  . ALPRAZolam (XANAX) 0.25 MG tablet TAKE 1 TO 2 TABLETS BY MOUTH DAILY AS NEEDED FOR ANXIETY 30 tablet 1  . fexofenadine (ALLEGRA) 30 MG tablet Take 30 mg by mouth 2 (two) times daily.    . fluticasone furoate-vilanterol (BREO ELLIPTA) 200-25 MCG/INH AEPB Inhale 1 puff into the lungs daily. 60 each 2  . lisinopril-hydrochlorothiazide (PRINZIDE,ZESTORETIC) 20-12.5 MG tablet TAKE 1 TABLET BY MOUTH  DAILY 90 tablet 1  . montelukast (SINGULAIR) 10 MG tablet TAKE 1 TABLET BY MOUTH AT BEDTIME 90 tablet 1  . PROAIR HFA 108 (90 Base) MCG/ACT inhaler INHALE 1 TO 2 PUFFS BY MOUTH EVERY 6 HOURS AS NEEDED FOR WHEEZING OR SHORTNESS OF BREATH 8.5 g 1  . triamcinolone (NASACORT ALLERGY 24HR) 55 MCG/ACT AERO nasal inhaler Place 2 sprays into the nose daily.    Marland Kitchen triamcinolone cream (KENALOG) 0.1 % Apply 1 application topically 2 (two) times daily. 453.6 g 1  . citalopram (CELEXA) 10 MG tablet TAKE 1 TABLET BY MOUTH EVERY DAY 30 tablet 0  . levofloxacin (LEVAQUIN) 500 MG tablet Take 1 tablet (500 mg total) by mouth daily. 7 tablet 0  . predniSONE (STERAPRED UNI-PAK 21 TAB) 10 MG (21) TBPK tablet Take 6 tablets x 1 day, 5 tablets x 1 day, 4 tablets x 1 day, 3 tablets x 1 day, 2 tablets x 1 day, 1 tablet x 1 day 21 tablet 0   No facility-administered medications prior to visit.       Past Surgical History:  Procedure Laterality Date  . CHOLECYSTECTOMY  2015  . melenoma removal  1979      Past Medical History:  Diagnosis Date  . Allergy   . Anxiety   . Asthma   . Cancer (H[facility])    Melanoma and Mycosis Fungosis  . Cutaneous T-cell lymphoma [facility]) 2011   Dermatologist  treated in [place]; Dr. Hipolito Bayley  . Depression   . Heart murmur   . Hypertension   . Urinary tract infection     Review of Systems  Constitutional: Negative for chills and fever.  Musculoskeletal:       Positive for posterior knee pain and edema  Neurological: Negative for weakness and numbness.      Objective:    BP (!) 160/96 (BP Location: Left Arm, Patient Position: Sitting, Cuff Size: Normal)   Pulse 78   Temp 98.2 F (36.8 C) (Oral)   Resp 14   Ht 5\' 7"  (1.702 m)   Wt 182 lb (82.6 kg)   SpO2 96%   BMI 28.51 kg/m  Nursing note and vital signs reviewed.  Physical Exam  Constitutional: She is oriented to person, place, and time. She appears well-developed and well-nourished. No distress.    Cardiovascular: Normal rate, regular rhythm, normal heart sounds and intact distal pulses.   Pulmonary/Chest: Effort normal and breath sounds normal.  Musculoskeletal:  Left knee - no obvious deformity, discoloration, or edema. There is tenderness and edema noted of the posterior popliteal fossa with the knee incomplete extension. It is firm to the touch. Range of motion is normal. Ligamentous and meniscal testing are negative. Distal pulses and sensation are intact and appropriate.  Neurological: She is alert and oriented to person, place, and time.  Skin: Skin is warm and dry.  Psychiatric: She has a normal mood and affect. Her behavior is normal. Judgment and thought content normal.   Examination: Ultrasound of knee Date:  02/13/2016 Patient Name: Glenda Bauer History: Enlarging area in back of knee x 2 weeks now with some discomfort.  Findings:  The extensor mechanism, including the quadriceps tendon, patella, and patellar tendon is normal without bursal abnormalities. No significant joint effusion or synovial hypertrophy. The medial collateral and lateral collateral ligaments are normal. Unremarkable iliotibial tract, biceps femoris, popliteus tendon, and common peroneal nerve. There is a large effusion/cyst located in popliteal fossa which does not appear attached. Limited evaluation of the menisci is unremarkable.  Impression:  Cyst/effusion in the popliteal fossa.       All images are located under the media tab. [place] ordered, performed and interpreted by Terri Piedra, FNP  Procedure - Ultrasound guided aspiration of Left knee cyst/effusion  Ultrasound guided aspiration/injection is preferred based studies that show increased duration, increased effect, greater accuracy, decreased procedural pain, increased response rate, and decreased cost with ultrasound guided versus blind aspiration/injection. Verbal informed consent obtained.  Time-out conducted. Site was identified and  marked. Noted no overlying erythema, induration, or other signs of local infection. Skin was prepped with Betadine applied in concentric circles and allowed to dry. Skin anesthesia was applied with cold spray per manufacturer's instructions. The site was then anesthetized with Marcaine. A 21-gauge needle was then inserted into the site and approximately 25 mL of serous fluid was removed. 1 mL of Kenalog was then injected into the area. The site was cleansed and bandaged. The procedure was tolerated well without complication or difficulty. Symptoms improved upon completion of the procedure. Advised to follow-up for signs of infections including fever/chills, erythema, induration, drainage, or persistent bleeding.    Images saved.       Assessment & Plan:   Problem List Items Addressed This Visit      Other   Cyst of knee joint - Primary    Symptoms and exam consistent with cyst/effusion located in the posterior aspect of the left  knee which was drained under ultrasound guidance without complication and tolerated well by patient. The site was injected with cortisone to prevent further cyst formation. Patient provided post-care instructions advised to follow-up for signs of infection.      Relevant Orders   [place] Extrem Low Left Ltd    Other Visit Diagnoses    Encounter for immunization       Relevant Orders   Flu vaccine HIGH DOSE PF (Completed)       I have discontinued Ms. Dethlefs's predniSONE and levofloxacin. I am also having her maintain her triamcinolone, albuterol, albuterol, fexofenadine, triamcinolone cream, acetaminophen-codeine, ADVAIR DISKUS, lisinopril-hydrochlorothiazide, PROAIR HFA, montelukast, fluticasone furoate-vilanterol, and ALPRAZolam.   Follow-up: Return if symptoms worsen or fail to improve.  Mauricio Po, FNP

## 2016-03-02 ENCOUNTER — Ambulatory Visit (INDEPENDENT_AMBULATORY_CARE_PROVIDER_SITE_OTHER): Payer: Medicare Other | Admitting: Family

## 2016-03-02 ENCOUNTER — Encounter: Payer: Self-pay | Admitting: Family

## 2016-03-02 VITALS — BP 138/90 | HR 65 | Temp 98.1°F | Resp 14 | Ht 67.0 in | Wt 179.0 lb

## 2016-03-02 DIAGNOSIS — J4540 Moderate persistent asthma, uncomplicated: Secondary | ICD-10-CM

## 2016-03-02 DIAGNOSIS — J0140 Acute pansinusitis, unspecified: Secondary | ICD-10-CM

## 2016-03-02 MED ORDER — FLUTICASONE FUROATE-VILANTEROL 200-25 MCG/INH IN AEPB: 1.0000 | INHALATION_SPRAY | Freq: Every day | RESPIRATORY_TRACT | 2 refills | Status: DC

## 2016-03-02 MED ORDER — PREDNISONE 10 MG (21) PO TBPK: ORAL_TABLET | ORAL | 0 refills | Status: DC

## 2016-03-02 MED ORDER — LEVOFLOXACIN 500 MG PO TABS: 500.0000 mg | ORAL_TABLET | Freq: Every day | ORAL | 0 refills | Status: DC

## 2016-03-02 NOTE — Assessment & Plan Note (Signed)
Symptoms and exam consistent with recurrent sinusitis. Start levofloxacin. Start prednisone. Referral to ear nose and throat placed for nasal polyp and recurrent sinusitis. Continue over-the-counter medications as needed for symptom relief and supportive care. Follow-up if symptoms worsen or do not improve.

## 2016-03-02 NOTE — Patient Instructions (Signed)
Thank you for choosing [facility].  SUMMARY AND INSTRUCTIONS:  Medication:  Please start the levofloxacin.  Please start the prednisone.   Your prescription(s) have been submitted to your pharmacy or been printed and provided for you. Please take as directed and contact our office if you believe you are having problem(s) with the medication(s) or have any questions.  Referrals:  They will call with a referral to ENT.   Referrals have been made during this visit. You should expect to hear back from our schedulers in about 7-10 days in regards to establishing an appointment with the specialists we discussed.   Follow up:  If your symptoms worsen or fail to improve, please contact our office for further instruction, or in case of emergency go directly to the emergency room at the closest medical facility.    General Recommendations:    Please drink plenty of fluids.  Get plenty of rest   Sleep in humidified air  Use saline nasal sprays  Netti pot   OTC Medications:  Decongestants - helps relieve congestion   Flonase (generic fluticasone) or Nasacort (generic triamcinolone) - please make sure to use the "cross-over" technique at a 45 degree angle towards the opposite eye as opposed to straight up the nasal passageway.   Sudafed (generic pseudoephedrine - Note this is the one that is available behind the pharmacy counter); Products with phenylephrine (-PE) may also be used but is often not as effective as pseudoephedrine.   If you have HIGH BLOOD PRESSURE - Coricidin HBP; AVOID any product that is -D as this contains pseudoephedrine which may increase your blood pressure.  Afrin (oxymetazoline) every 6-8 hours for up to 3 days.   Allergies - helps relieve runny nose, itchy eyes and sneezing   Claritin (generic loratidine), Allegra (fexofenidine), or Zyrtec (generic cyrterizine) for runny nose. These medications should not cause drowsiness.  Note - Benadryl  (generic diphenhydramine) may be used however may cause drowsiness  Cough -   Delsym or Robitussin (generic dextromethorphan)  Expectorants - helps loosen mucus to ease removal   Mucinex (generic guaifenesin) as directed on the package.  Headaches / General Aches   Tylenol (generic acetaminophen) - DO NOT EXCEED 3 grams (3,000 mg) in a 24 hour time period  Advil/Motrin (generic ibuprofen)   Sore Throat -   Salt water gargle   Chloraseptic (generic benzocaine) spray or lozenges / Sucrets (generic dyclonine)    Sinusitis Sinusitis is redness, soreness, and inflammation of the paranasal sinuses. Paranasal sinuses are air pockets within the bones of your face (beneath the eyes, the middle of the forehead, or above the eyes). In healthy paranasal sinuses, mucus is able to drain out, and air is able to circulate through them by way of your nose. However, when your paranasal sinuses are inflamed, mucus and air can become trapped. This can allow bacteria and other germs to grow and cause infection. Sinusitis can develop quickly and last only a short time (acute) or continue over a long period (chronic). Sinusitis that lasts for more than 12 weeks is considered chronic.  CAUSES  Causes of sinusitis include:  Allergies.  Structural abnormalities, such as displacement of the cartilage that separates your nostrils (deviated septum), which can decrease the air flow through your nose and sinuses and affect sinus drainage.  Functional abnormalities, such as when the small hairs (cilia) that line your sinuses and help remove mucus do not work properly or are not present. SIGNS AND SYMPTOMS  Symptoms of  acute and chronic sinusitis are the same. The primary symptoms are pain and pressure around the affected sinuses. Other symptoms include:  Upper toothache.  Earache.  Headache.  Bad breath.  Decreased sense of smell and taste.  A cough, which worsens when you are lying  flat.  Fatigue.  Fever.  Thick drainage from your nose, which often is green and may contain pus (purulent).  Swelling and warmth over the affected sinuses. DIAGNOSIS  Your health care provider will perform a physical exam. During the exam, your health care provider may:  Look in your nose for signs of abnormal growths in your nostrils (nasal polyps).  Tap over the affected sinus to check for signs of infection.  View the inside of your sinuses (endoscopy) using an imaging device that has a light attached (endoscope). If your health care provider suspects that you have chronic sinusitis, one or more of the following tests may be recommended:  Allergy tests.  Nasal culture. A sample of mucus is taken from your nose, sent to a lab, and screened for bacteria.  Nasal cytology. A sample of mucus is taken from your nose and examined by your health care provider to determine if your sinusitis is related to an allergy. TREATMENT  Most cases of acute sinusitis are related to a viral infection and will resolve on their own within 10 days. Sometimes medicines are prescribed to help relieve symptoms (pain medicine, decongestants, nasal steroid sprays, or saline sprays).  However, for sinusitis related to a bacterial infection, your health care provider will prescribe antibiotic medicines. These are medicines that will help kill the bacteria causing the infection.  Rarely, sinusitis is caused by a fungal infection. In theses cases, your health care provider will prescribe antifungal medicine. For some cases of chronic sinusitis, surgery is needed. Generally, these are cases in which sinusitis recurs more than 3 times per year, despite other treatments. HOME CARE INSTRUCTIONS   Drink plenty of water. Water helps thin the mucus so your sinuses can drain more easily.  Use a humidifier.  Inhale steam 3 to 4 times a day (for example, sit in the bathroom with the shower running).  Apply a warm,  moist washcloth to your face 3 to 4 times a day, or as directed by your health care provider.  Use saline nasal sprays to help moisten and clean your sinuses.  Take medicines only as directed by your health care provider.  If you were prescribed either an antibiotic or antifungal medicine, finish it all even if you start to feel better. SEEK IMMEDIATE MEDICAL CARE IF:  You have increasing pain or severe headaches.  You have nausea, vomiting, or drowsiness.  You have swelling around your face.  You have vision problems.  You have a stiff neck.  You have difficulty breathing. MAKE SURE YOU:   Understand these instructions.  Will watch your condition.  Will get help right away if you are not doing well or get worse. Document Released: 05/24/2005 Document Revised: 10/08/2013 Document Reviewed: 06/08/2011 [facility] Patient Information 2015 [place], [place]. This information is not intended to replace advice given to you by your health care provider. Make sure you discuss any questions you have with your health care provider.

## 2016-03-02 NOTE — Progress Notes (Signed)
Subjective:    Patient ID: Louise Coonan, female    DOB: April 22, 1951, 65 y.o.   MRN: [id number]  Chief Complaint  Patient presents with  . Sinus issues    states that nasal polyps are swelling up, thinks she has a sinus infection, sinus headaches    HPI:  Eevee Whitehorse is a 65 y.o. 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 and presents today for an acute office visit.  This is a chronic problem. Associated symptoms of inflammed nasal polyp, congestion, and headaches have have been going on for about the last week with gradual worsening of symptoms. Modifying factors include Nasacort which she feels like the polyp is causing difficulties. No fevers.  Recent completion of levofloxacin about 1 month ago.     No Known Allergies    Outpatient Medications Prior to Visit  Medication Sig Dispense Refill  . acetaminophen-codeine (TYLENOL #3) 300-30 MG per tablet Take 1 tablet by mouth every 4 (four) hours as needed for moderate pain. 40 tablet 0  . albuterol (PROVENTIL) (5 MG/ML) 0.5% nebulizer solution Take 0.5 mLs (2.5 mg total) by nebulization every 6 (six) hours as needed for wheezing or shortness of breath. 20 mL 0  . albuterol (VENTOLIN HFA) 108 (90 BASE) MCG/ACT inhaler INHALE 1-2 PUFFS INTO THE LUNGS EVERY 6 HOURS AS NEEDED FOR WHEEZING OR SHORTNESS OF BREATH 18 g 1  . ALPRAZolam (XANAX) 0.25 MG tablet TAKE 1 TO 2 TABLETS BY MOUTH DAILY AS NEEDED FOR ANXIETY 30 tablet 1  . citalopram (CELEXA) 10 MG tablet Take 1 tablet (10 mg total) by mouth daily. 90 tablet 1  . fexofenadine (ALLEGRA) 30 MG tablet Take 30 mg by mouth 2 (two) times daily.    Marland Kitchen lisinopril-hydrochlorothiazide (PRINZIDE,ZESTORETIC) 20-12.5 MG tablet TAKE 1 TABLET BY MOUTH DAILY 90 tablet 1  . montelukast (SINGULAIR) 10 MG tablet TAKE 1 TABLET BY MOUTH AT BEDTIME 90  tablet 1  . PROAIR HFA 108 (90 Base) MCG/ACT inhaler INHALE 1 TO 2 PUFFS BY MOUTH EVERY 6 HOURS AS NEEDED FOR WHEEZING OR SHORTNESS OF BREATH 8.5 g 1  . triamcinolone (NASACORT ALLERGY 24HR) 55 MCG/ACT AERO nasal inhaler Place 2 sprays into the nose daily.    Marland Kitchen triamcinolone cream (KENALOG) 0.1 % Apply 1 application topically 2 (two) times daily. 453.6 g 1  . ADVAIR DISKUS 100-50 MCG/DOSE AEPB INHALE 1 PUFF INTO THE LUNGS TWICE DAILY 60 each 3  . fluticasone furoate-vilanterol (BREO ELLIPTA) 200-25 MCG/INH AEPB Inhale 1 puff into the lungs daily. 60 each 2   No facility-administered medications prior to visit.       Past Surgical History:  Procedure Laterality Date  . CHOLECYSTECTOMY  2015  . melenoma removal  1979      Past Medical History:  Diagnosis Date  . Allergy   . Anxiety   . Asthma   . Cancer (HCC)    Melanoma and Mycosis Fungosis  . Cutaneous T-cell lymphoma [facility]) 2011   Dermatologist treated in [place]; Dr. Hipolito Bayley  . Depression   . Heart murmur   . Hypertension   . Urinary tract infection       Review of Systems  Constitutional: Negative for chills and fever.  HENT: Positive for congestion, postnasal drip and sinus pressure. Negative for sneezing and sore throat.   Respiratory: Positive for chest tightness. Negative for shortness of breath.  Cardiovascular: Negative for chest pain and leg swelling.      Objective:    BP 138/90 (BP Location: Left Arm, Patient Position: Sitting, Cuff Size: Normal)   Pulse 65   Temp 98.1 F (36.7 C) (Oral)   Resp 14   Ht 5\' 7"  (1.702 m)   Wt 179 lb (81.2 kg)   SpO2 96%   BMI 28.04 kg/m  Nursing note and vital signs reviewed.  Physical Exam  Constitutional: She is oriented to person, place, and time. She appears well-developed and well-nourished. No distress.  HENT:  Right Ear: Hearing, tympanic membrane, external ear and ear canal normal.  Left Ear: Hearing, tympanic membrane, external ear and ear canal  normal.  Nose: Right sinus exhibits maxillary sinus tenderness and frontal sinus tenderness. Left sinus exhibits maxillary sinus tenderness and frontal sinus tenderness.  Mouth/Throat: Uvula is midline, oropharynx is clear and moist and mucous membranes are normal.  Neck: Neck supple.  Cardiovascular: Normal rate, regular rhythm, normal heart sounds and intact distal pulses.   Pulmonary/Chest: Effort normal and breath sounds normal.  Neurological: She is alert and oriented to person, place, and time.  Skin: Skin is warm and dry.  Psychiatric: She has a normal mood and affect. Her behavior is normal. Judgment and thought content normal.       Assessment & Plan:   Problem List Items Addressed This Visit      Respiratory   Sinusitis - Primary    Symptoms and exam consistent with recurrent sinusitis. Start levofloxacin. Start prednisone. Referral to ear nose and throat placed for nasal polyp and recurrent sinusitis. Continue over-the-counter medications as needed for symptom relief and supportive care. Follow-up if symptoms worsen or do not improve.      Relevant Medications   levofloxacin (LEVAQUIN) 500 MG tablet   predniSONE (STERAPRED UNI-PAK 21 TAB) 10 MG (21) TBPK tablet   Other Relevant Orders   Ambulatory referral to ENT    Other Visit Diagnoses    Asthma, moderate persistent, uncomplicated       Relevant Medications   predniSONE (STERAPRED UNI-PAK 21 TAB) 10 MG (21) TBPK tablet   fluticasone furoate-vilanterol (BREO ELLIPTA) 200-25 MCG/INH AEPB       I have discontinued Ms. Risko's ADVAIR DISKUS. I am also having her start on levofloxacin and predniSONE. Additionally, I am having her maintain her triamcinolone, albuterol, albuterol, fexofenadine, triamcinolone cream, acetaminophen-codeine, lisinopril-hydrochlorothiazide, PROAIR HFA, montelukast, ALPRAZolam, citalopram, and fluticasone furoate-vilanterol.   Meds ordered this encounter  Medications  . levofloxacin  (LEVAQUIN) 500 MG tablet    Sig: Take 1 tablet (500 mg total) by mouth daily.    Dispense:  7 tablet    Refill:  0    Order Specific Question:   Supervising Provider    Answer:   Hillard Danker A [[id number]]  . predniSONE (STERAPRED UNI-PAK 21 TAB) 10 MG (21) TBPK tablet    Sig: Take 6 tablets x 1 day, 5 tablets x 1 day, 4 tablets x 1 day, 3 tablets x 1 day, 2 tablets x 1 day, 1 tablet x 1 day    Dispense:  21 tablet    Refill:  0    Order Specific Question:   Supervising Provider    Answer:   Hillard Danker A [[id number]]  . fluticasone furoate-vilanterol (BREO ELLIPTA) 200-25 MCG/INH AEPB    Sig: Inhale 1 puff into the lungs daily.    Dispense:  60 each    Refill:  2    Order  Specific Question:   Supervising Provider    Answer:   Pricilla Holm A [id number]     Follow-up: No Follow-up on file.  Mauricio Po, FNP

## 2016-03-19 ENCOUNTER — Other Ambulatory Visit: Payer: Self-pay | Admitting: Family

## 2016-03-31 DIAGNOSIS — J324 Chronic pansinusitis: Secondary | ICD-10-CM | POA: Diagnosis not present

## 2016-03-31 DIAGNOSIS — J339 Nasal polyp, unspecified: Secondary | ICD-10-CM | POA: Diagnosis not present

## 2016-03-31 DIAGNOSIS — J343 Hypertrophy of nasal turbinates: Secondary | ICD-10-CM | POA: Diagnosis not present

## 2016-04-20 ENCOUNTER — Other Ambulatory Visit: Payer: Self-pay | Admitting: Family

## 2016-04-21 ENCOUNTER — Other Ambulatory Visit: Payer: Self-pay | Admitting: Family

## 2016-04-21 NOTE — Telephone Encounter (Signed)
Last refill was 02/05/16

## 2016-04-22 NOTE — Telephone Encounter (Signed)
Rx faxed

## 2016-05-21 ENCOUNTER — Other Ambulatory Visit: Payer: Self-pay | Admitting: Family

## 2016-05-21 NOTE — Telephone Encounter (Signed)
Faxed

## 2016-05-21 NOTE — Telephone Encounter (Signed)
Last refill:04/22/16

## 2016-07-04 ENCOUNTER — Other Ambulatory Visit: Payer: Self-pay | Admitting: Family

## 2016-07-05 NOTE — Telephone Encounter (Signed)
Last refill was 05/21/16

## 2016-07-07 NOTE — Telephone Encounter (Signed)
Faxed script back to [facility].../lmb 

## 2016-07-30 ENCOUNTER — Other Ambulatory Visit: Payer: Self-pay | Admitting: Family

## 2016-08-02 NOTE — Telephone Encounter (Signed)
07/06/16 was last refill

## 2016-08-04 NOTE — Telephone Encounter (Signed)
Faxed

## 2016-09-27 ENCOUNTER — Other Ambulatory Visit: Payer: Self-pay | Admitting: Internal Medicine

## 2016-09-29 NOTE — Telephone Encounter (Signed)
Faxed

## 2016-11-09 ENCOUNTER — Other Ambulatory Visit: Payer: Self-pay | Admitting: Family

## 2016-11-09 DIAGNOSIS — J4540 Moderate persistent asthma, uncomplicated: Secondary | ICD-10-CM

## 2017-02-11 ENCOUNTER — Other Ambulatory Visit: Payer: Self-pay | Admitting: Family

## 2017-02-15 ENCOUNTER — Other Ambulatory Visit: Payer: Self-pay | Admitting: Family

## 2017-02-16 ENCOUNTER — Other Ambulatory Visit: Payer: Self-pay | Admitting: Family

## 2017-03-09 ENCOUNTER — Other Ambulatory Visit: Payer: Self-pay | Admitting: Family

## 2017-03-23 ENCOUNTER — Other Ambulatory Visit: Payer: Self-pay | Admitting: Family

## 2017-05-23 ENCOUNTER — Encounter: Payer: Self-pay | Admitting: Adult Health

## 2017-05-23 ENCOUNTER — Ambulatory Visit: Payer: Medicare Other | Admitting: Adult Health

## 2017-05-23 VITALS — BP 130/100 | Temp 97.6°F | Ht 66.0 in | Wt 194.0 lb

## 2017-05-23 DIAGNOSIS — Z7689 Persons encountering health services in other specified circumstances: Secondary | ICD-10-CM | POA: Diagnosis not present

## 2017-05-23 DIAGNOSIS — C84A0 Cutaneous T-cell lymphoma, unspecified, unspecified site: Secondary | ICD-10-CM | POA: Diagnosis not present

## 2017-05-23 DIAGNOSIS — I10 Essential (primary) hypertension: Secondary | ICD-10-CM | POA: Diagnosis not present

## 2017-05-23 DIAGNOSIS — J4541 Moderate persistent asthma with (acute) exacerbation: Secondary | ICD-10-CM

## 2017-05-23 MED ORDER — LISINOPRIL-HYDROCHLOROTHIAZIDE 20-12.5 MG PO TABS: 1.0000 | ORAL_TABLET | Freq: Every day | ORAL | 1 refills | Status: DC

## 2017-05-23 MED ORDER — TRIAMCINOLONE ACETONIDE 0.1 % EX CREA: 1.0000 "application " | TOPICAL_CREAM | Freq: Two times a day (BID) | CUTANEOUS | 1 refills | Status: DC

## 2017-05-23 MED ORDER — PREDNISONE 10 MG PO TABS: ORAL_TABLET | ORAL | 0 refills | Status: DC

## 2017-05-23 NOTE — Patient Instructions (Signed)
It was great meeting you today. I have sent in your medications   Please follow up with me for your physical   I have attached information on Allergy and Asthma as well as Dermatology

## 2017-05-23 NOTE — Progress Notes (Signed)
Patient presents to clinic today to establish care. 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   Her last physical was 2015   Acute Concerns: Establish Care   Medication refill   Chronic Issues: Hypertension - Currently maintained on Lisinopril-HCTZ 20.12.5. She has run out of medications   Asthma - Currently maintained on Albuterol Nebulizer, Albuterol rescue inhaler ( lately every day) ,Singular and Breo Ellipta. She feels more SOB and wheezy then she normally has. She has not been evaluated by Pulmonary. She is interested in seeing Allergy & Asthma   Anxiety/Depression - Currently takes Celexa 10 mg and Xanax 0.25 mg PRN.   Fibromyalgia- Takes Celexa    Health Maintenance: Dental -- Does not see on a regular basis  Vision -- Routine basis Immunizations -- UTD  Colonoscopy -- 2010 - normal 0 do not have results  Mammogram --  2015  PAP -- Last at 63  Bone Density -- 2016  Diet: Tries to eat healthy  Exercise: She tries to exercise but gets SOB due to asthma   ENT - Dr. Redmond Baseman    Past Medical History:  Diagnosis Date  . Allergy   . Anxiety   . Asthma   . Cancer (HCC)    Melanoma and Mycosis Fungosis  . Cutaneous T-cell lymphoma [facility]) 2011   Dermatologist treated in [place]; Dr. Hipolito Bayley  . Depression   . Heart murmur   . Hypertension   . Urinary tract infection     Past Surgical History:  Procedure Laterality Date  . CHOLECYSTECTOMY  2015  . melenoma removal  1979    Current Outpatient Medications on File Prior to Visit  Medication Sig Dispense Refill  . albuterol (PROVENTIL) (5 MG/ML) 0.5% nebulizer solution Take 0.5 mLs (2.5 mg total) by nebulization every 6 (six) hours as needed for wheezing or shortness of breath. 20 mL 0  . ALPRAZolam (XANAX) 0.25 MG tablet TAKE 1 TO 2  TABLETS BY MOUTH DAILY AS NEEDED FOR ANXIETY 30 tablet 2  . BREO ELLIPTA 200-25 MCG/INH AEPB INHALE 1 PUFF INTO THE LUNGS BY MOUTH DAILY 60 each 1  . citalopram (CELEXA) 10 MG tablet TAKE 1 TABLET(10 MG) BY MOUTH DAILY 90 tablet 0  . famotidine (PEPCID) 20 MG tablet Take 20 mg by mouth daily.    . fexofenadine (ALLEGRA) 30 MG tablet Take 30 mg by mouth 2 (two) times daily.    . fluticasone (FLONASE) 50 MCG/ACT nasal spray Place 2 sprays into both nostrils daily.    Marland Kitchen lisinopril-hydrochlorothiazide (PRINZIDE,ZESTORETIC) 20-12.5 MG tablet Take 1 tablet by mouth daily. Needs office visit for more refills. 30 tablet 0  . montelukast (SINGULAIR) 10 MG tablet TAKE 1 TABLET BY MOUTH AT BEDTIME 90 tablet 0  . Multiple Vitamin (MULTIVITAMIN) tablet Take 1 tablet by mouth daily.    . Multiple Vitamins-Minerals (VISION FORMULA PO) Take by mouth.    Marland Kitchen PROAIR HFA 108 (90 Base) MCG/ACT inhaler INHALE 1 TO 2 PUFFS BY MOUTH EVERY 6 HOURS AS NEEDED FOR WHEEZING OR SHORTNESS OF BREATH 8.5 g 0  . triamcinolone cream (KENALOG) 0.1 % Apply 1 application topically 2 (two) times daily. 453.6 g 1   No current facility-administered medications on file prior to visit.     No Known Allergies  Family History  Problem Relation Age of Onset  .  Arthritis Mother   . Ovarian cancer Mother   . Breast cancer Mother     Social History   Socioeconomic History  . Marital status: Married    Spouse name: Not on file  . Number of children: 3  . Years of education: 63  . Highest education level: Not on file  Social Needs  . Financial resource strain: Not on file  . Food insecurity - worry: Not on file  . Food insecurity - inability: Not on file  . Transportation needs - medical: Not on file  . Transportation needs - non-medical: Not on file  Occupational History  . Occupation: Medical sales representative   Tobacco Use  . Smoking status: Former Smoker    Packs/day: 0.25    Years: 10.00    Pack years: 2.50    Types: Cigarettes  .  Smokeless tobacco: Never Used  . Tobacco comment: 35 years ago; smoke socially  Substance and Sexual Activity  . Alcohol use: No    Alcohol/week: 0.0 oz  . Drug use: No  . Sexual activity: Not on file  Other Topics Concern  . Not on file  Social History Narrative   Born and raised in [place], [place]. Currently resides in apartment with her husband and son. 2 dogs. Fun: hiking, reading.   Denies religious beliefs that would effect health care.     Review of Systems  Constitutional: Negative.   HENT: Negative.   Respiratory: Positive for shortness of breath and wheezing.   Cardiovascular: Negative.   Genitourinary: Negative.   Musculoskeletal: Positive for joint pain and myalgias.  Neurological: Negative.   Psychiatric/Behavioral: Negative.   All other systems reviewed and are negative.   BP (!) 130/100 (BP Location: Left Arm)   Temp 97.6 F (36.4 C) (Oral)   Ht 5\' 6"  (1.676 m)   Wt 194 lb (88 kg)   BMI 31.31 kg/m   Physical Exam  Constitutional: She is oriented to person, place, and time and well-developed, well-nourished, and in no distress. No distress.  Eyes: Conjunctivae and EOM are normal. Pupils are equal, round, and reactive to light. Right eye exhibits no discharge. Left eye exhibits no discharge. No scleral icterus.  Neck: Normal range of motion. Neck supple. No JVD present. No tracheal deviation present. No thyromegaly present.  Cardiovascular: Normal rate, regular rhythm, normal heart sounds and intact distal pulses. Exam reveals no gallop and no friction rub.  No murmur heard. Pulmonary/Chest: Effort normal. Stridor present. No respiratory distress. She has wheezes (throughout ). She has no rales. She exhibits no tenderness.  Abdominal: Soft. Bowel sounds are normal. She exhibits no distension and no mass. There is no tenderness. There is no rebound and no guarding.  Musculoskeletal: Normal range of motion. She exhibits tenderness (multiple sites from fibromyalgia ).  She exhibits no edema or deformity.  Lymphadenopathy:    She has no cervical adenopathy.  Neurological: She is alert and oriented to person, place, and time. She has normal reflexes. She displays normal reflexes. No cranial nerve deficit. She exhibits normal muscle tone. Gait normal. Coordination normal. GCS score is 15.  Skin: Skin is warm and dry. No rash noted. She is not diaphoretic. No erythema. No pallor.  Psychiatric: Mood, memory, affect and judgment normal.  Nursing note and vitals reviewed.  Assessment/Plan: 1. Encounter to establish care - Follow up for CPE  - Follow up sooner if needed -Encouraged weight loss   2. Essential hypertension - Will recheck at [place] [facility])  20-12.5 MG tablet; Take 1 tablet by mouth daily.  Dispense: 90 tablet; Refill: 1  3. 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  Dispense: 453.6 g; Refill: 1  4. Moderate persistent asthma with acute exacerbation - She will follow up with asthma and allergy  - predniSONE (DELTASONE) 10 MG tablet; 40 mg x 3 days, 20 mg x 3 days, 10 mg x 3 days  Dispense: 21 tablet; Refill: 0   [name], NP

## 2017-05-26 ENCOUNTER — Other Ambulatory Visit: Payer: Self-pay | Admitting: Adult Health

## 2017-05-26 MED ORDER — ALPRAZOLAM 0.25 MG PO TABS: ORAL_TABLET | ORAL | 2 refills | Status: DC

## 2017-06-22 ENCOUNTER — Ambulatory Visit (INDEPENDENT_AMBULATORY_CARE_PROVIDER_SITE_OTHER): Payer: Medicare Other | Admitting: Adult Health

## 2017-06-22 ENCOUNTER — Telehealth: Payer: Self-pay | Admitting: Adult Health

## 2017-06-22 ENCOUNTER — Encounter: Payer: Self-pay | Admitting: Adult Health

## 2017-06-22 VITALS — BP 122/80 | Temp 98.1°F | Ht 66.75 in | Wt 193.0 lb

## 2017-06-22 DIAGNOSIS — J4540 Moderate persistent asthma, uncomplicated: Secondary | ICD-10-CM | POA: Diagnosis not present

## 2017-06-22 DIAGNOSIS — Z0001 Encounter for general adult medical examination with abnormal findings: Secondary | ICD-10-CM

## 2017-06-22 DIAGNOSIS — I10 Essential (primary) hypertension: Secondary | ICD-10-CM

## 2017-06-22 DIAGNOSIS — Z0000 Encounter for general adult medical examination without abnormal findings: Secondary | ICD-10-CM

## 2017-06-22 LAB — CBC WITH DIFFERENTIAL/PLATELET
Basophils Absolute: 0.1 10*3/uL (ref 0.0–0.1)
Basophils Relative: 2.5 % (ref 0.0–3.0)
EOS PCT: 8.2 % — AB (ref 0.0–5.0)
Eosinophils Absolute: 0.3 10*3/uL (ref 0.0–0.7)
HEMATOCRIT: 44.5 % (ref 36.0–46.0)
HEMOGLOBIN: 15.1 g/dL — AB (ref 12.0–15.0)
LYMPHS PCT: 35.8 % (ref 12.0–46.0)
Lymphs Abs: 1.5 10*3/uL (ref 0.7–4.0)
MCHC: 34 g/dL (ref 30.0–36.0)
MCV: 88.1 fl (ref 78.0–100.0)
MONO ABS: 0.4 10*3/uL (ref 0.1–1.0)
Monocytes Relative: 10.5 % (ref 3.0–12.0)
Neutro Abs: 1.8 10*3/uL (ref 1.4–7.7)
Neutrophils Relative %: 43 % (ref 43.0–77.0)
Platelets: 261 10*3/uL (ref 150.0–400.0)
RBC: 5.05 Mil/uL (ref 3.87–5.11)
RDW: 13.1 % (ref 11.5–15.5)
WBC: 4.3 10*3/uL (ref 4.0–10.5)

## 2017-06-22 LAB — HEPATIC FUNCTION PANEL
ALT: 32 U/L (ref 0–35)
AST: 20 U/L (ref 0–37)
Albumin: 4.2 g/dL (ref 3.5–5.2)
Alkaline Phosphatase: 58 U/L (ref 39–117)
BILIRUBIN TOTAL: 0.7 mg/dL (ref 0.2–1.2)
Bilirubin, Direct: 0.1 mg/dL (ref 0.0–0.3)
Total Protein: 6.7 g/dL (ref 6.0–8.3)

## 2017-06-22 LAB — LIPID PANEL
Cholesterol: 179 mg/dL (ref 0–200)
HDL: 64.9 mg/dL (ref >39.00)
LDL CALC: 93 mg/dL (ref 0–99)
NonHDL: 114.11
TRIGLYCERIDES: 106 mg/dL (ref 0.0–149.0)
Total CHOL/HDL Ratio: 3
VLDL: 21.2 mg/dL (ref 0.0–40.0)

## 2017-06-22 LAB — BASIC METABOLIC PANEL
BUN: 15 mg/dL (ref 6–23)
CHLORIDE: 107 meq/L (ref 96–112)
CO2: 25 mEq/L (ref 19–32)
Calcium: 9.1 mg/dL (ref 8.4–10.5)
Creatinine, Ser: 0.79 mg/dL (ref 0.40–1.20)
GFR: 76.83 mL/min (ref >60.00)
Glucose, Bld: 115 mg/dL — ABNORMAL HIGH (ref 70–99)
POTASSIUM: 3.7 meq/L (ref 3.5–5.1)
SODIUM: 142 meq/L (ref 135–145)

## 2017-06-22 LAB — TSH: TSH: 1.11 u[IU]/mL (ref 0.35–4.50)

## 2017-06-22 MED ORDER — ALBUTEROL SULFATE (5 MG/ML) 0.5% IN NEBU: 2.5000 mg | INHALATION_SOLUTION | Freq: Four times a day (QID) | RESPIRATORY_TRACT | 11 refills | Status: DC | PRN

## 2017-06-22 MED ORDER — ALBUTEROL SULFATE (2.5 MG/3ML) 0.083% IN NEBU: 2.5000 mg | INHALATION_SOLUTION | Freq: Four times a day (QID) | RESPIRATORY_TRACT | 11 refills | Status: DC | PRN

## 2017-06-22 MED ORDER — PREDNISONE 10 MG PO TABS: ORAL_TABLET | ORAL | 1 refills | Status: DC

## 2017-06-22 MED ORDER — FLUTICASONE FUROATE-VILANTEROL 200-25 MCG/INH IN AEPB: 1.0000 | INHALATION_SPRAY | Freq: Every day | RESPIRATORY_TRACT | 11 refills | Status: DC

## 2017-06-22 NOTE — Patient Instructions (Signed)
It was great seeing you today   I will follow up with you regarding your lab work. If you need anything, please let me know

## 2017-06-22 NOTE — Telephone Encounter (Signed)
Called and spoke to [place].  Informed her that a new rx has been sent to the pharmacy.

## 2017-06-22 NOTE — Progress Notes (Signed)
Subjective:    Patient ID: Glenda Bauer, female    DOB: 1950/11/23, 67 y.o.   MRN: [id number]  HPI  Patient presents for yearly preventative medicine examination. 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  Hypertension - Currently maintained on Lisinopril-HCTZ 20.12.5. - stable   Asthma - Currently maintained on Albuterol Nebulizer, Albuterol rescue inhaler ( lately every day) ,Singular and Breo Ellipta. She feels more SOB and wheezy then she normally has. She has not been evaluated by Pulmonary in the past.  Anxiety/Depression - Currently takes Celexa 10 mg and Xanax 0.25 mg PRN.   Fibromyalgia- Takes Celexa    All immunizations and health maintenance protocols were reviewed with the patient and needed orders were placed.  Appropriate screening laboratory values were ordered for the patient including screening of hyperlipidemia, renal function and hepatic function.  Medication reconciliation,  past medical history, social history, problem list and allergies were reviewed in detail with the patient  Goals were established with regard to weight loss, exercise, and  diet in compliance with medications. She "attempts" to eat a heart healthy diet and exercise.   End of life planning was discussed. She does not have an advanced directive or living will. Information packet given.   She is up to date on her colonoscopy ( due next year, per patient), she is not up to date on her mammogram, she plans on making an appointment for this. She does participate in vision exams but not dental exams.   Review of Systems  Constitutional: Negative.   HENT: Negative.   Eyes: Negative.   Respiratory: Positive for cough, chest tightness, shortness of breath and wheezing.   Cardiovascular: Negative.   Gastrointestinal:  Negative.   Endocrine: Negative.   Genitourinary: Negative.   Musculoskeletal: Positive for arthralgias and myalgias.  Skin: Negative.   Allergic/Immunologic: Negative.   Neurological: Negative.   Hematological: Negative.   Psychiatric/Behavioral: Negative.    Past Medical History:  Diagnosis Date  . Allergy   . Anxiety   . Asthma   . Cancer (HCC)    Melanoma and Mycosis Fungosis  . Cutaneous T-cell lymphoma [facility] 2011   Dermatologist treated in [place]; Dr. Hipolito Bayley  . Depression   . Heart murmur   . Hypertension   . Urinary tract infection     Social History   Socioeconomic History  . Marital status: Legally Separated    Spouse name: Not on file  . Number of children: 3  . Years of education: 66  . Highest education level: Not on file  Social Needs  . Financial resource strain: Not on file  . Food insecurity - worry: Not on file  . Food insecurity - inability: Not on file  . Transportation needs - medical: Not on file  . Transportation needs - non-medical: Not on file  Occupational History  . Occupation: Medical sales representative   Tobacco Use  . Smoking status: Former Smoker    Packs/day: 0.25    Years: 10.00    Pack years: 2.50    Types: Cigarettes  . Smokeless tobacco: Never Used  . Tobacco comment: 35 years ago; smoke socially  Substance and Sexual Activity  . Alcohol use: No    Alcohol/week: 0.0 oz  . Drug use: No  . Sexual activity: Not on file  Other Topics Concern  . Not  on file  Social History Narrative   Separated    Three children ( one lives locally)     Past Surgical History:  Procedure Laterality Date  . Cataracts Bilateral   . CHOLECYSTECTOMY  2015  . melenoma removal  1979  . Sinus Polyps       Family History  Problem Relation Age of Onset  . Arthritis Mother   . Ovarian cancer Mother   . Breast cancer Mother   . Parkinson's disease Father   . Crohn's disease Brother   . Schizophrenia Brother     No Known Allergies  Current  Outpatient Medications on File Prior to Visit  Medication Sig Dispense Refill  . ALPRAZolam (XANAX) 0.25 MG tablet TAKE 1 TO 2 TABLETS BY MOUTH DAILY AS NEEDED FOR ANXIETY 30 tablet 2  . citalopram (CELEXA) 10 MG tablet TAKE 1 TABLET(10 MG) BY MOUTH DAILY 90 tablet 0  . famotidine (PEPCID) 20 MG tablet Take 20 mg by mouth daily.    . fexofenadine (ALLEGRA) 30 MG tablet Take 30 mg by mouth 2 (two) times daily.    . fluticasone (FLONASE) 50 MCG/ACT nasal spray Place 2 sprays into both nostrils daily.    Marland Kitchen lisinopril-hydrochlorothiazide (PRINZIDE,ZESTORETIC) 20-12.5 MG tablet Take 1 tablet by mouth daily. 90 tablet 1  . montelukast (SINGULAIR) 10 MG tablet TAKE 1 TABLET BY MOUTH AT BEDTIME 90 tablet 0  . Multiple Vitamin (MULTIVITAMIN) tablet Take 1 tablet by mouth daily.    . Multiple Vitamins-Minerals (VISION FORMULA PO) Take by mouth.    Marland Kitchen PROAIR HFA 108 (90 Base) MCG/ACT inhaler INHALE 1 TO 2 PUFFS BY MOUTH EVERY 6 HOURS AS NEEDED FOR WHEEZING OR SHORTNESS OF BREATH 8.5 g 0  . triamcinolone cream (KENALOG) 0.1 % Apply 1 application topically 2 (two) times daily. 453.6 g 1   No current facility-administered medications on file prior to visit.     BP 122/80 (BP Location: Left Arm)   Temp 98.1 F (36.7 C) (Oral)   Ht 5' 6.75" (1.695 m)   Wt 193 lb (87.5 kg)   BMI 30.45 kg/m       Objective:   Physical Exam  Constitutional: She is oriented to person, place, and time. She appears well-developed and well-nourished. No distress.  HENT:  Head: Normocephalic and atraumatic.  Right Ear: External ear normal.  Left Ear: External ear normal.  Nose: Nose normal.  Mouth/Throat: Oropharynx is clear and moist. No oropharyngeal exudate.  Eyes: Conjunctivae and EOM are normal. Pupils are equal, round, and reactive to light. Right eye exhibits no discharge. Left eye exhibits no discharge. No scleral icterus.  Neck: Normal range of motion. Neck supple. No JVD present. No tracheal deviation present.  No thyromegaly present.  Cardiovascular: Normal rate, regular rhythm, normal heart sounds and intact distal pulses. Exam reveals no gallop and no friction rub.  No murmur heard. Pulmonary/Chest: Effort normal. No stridor. No respiratory distress. She has wheezes (at bases ). She has no rales. She exhibits no tenderness.  Abdominal: Soft. Bowel sounds are normal. She exhibits no distension and no mass. There is no tenderness. There is no rebound and no guarding.  Genitourinary:  Genitourinary Comments: Refused breast exam    Musculoskeletal: Normal range of motion. She exhibits tenderness (multi joint pain from fibromyalgia ). She exhibits no edema or deformity.  Lymphadenopathy:    She has no cervical adenopathy.  Neurological: She is alert and oriented to person, place, and time. She has normal reflexes. She  displays normal reflexes. No cranial nerve deficit. She exhibits normal muscle tone. Coordination normal.  Skin: Skin is warm and dry. No rash noted. She is not diaphoretic. No erythema. No pallor.  Psychiatric: She has a normal mood and affect. Her behavior is normal. Judgment and thought content normal.  Nursing note and vitals reviewed.     Assessment & Plan:  1. Routine general medical examination at a health care facility - Encouraged heart healthy exercise and frequent exercise  - Encouraged to call and schedule her mammogram  - Follow up in on year or sooner if needed - Basic metabolic panel - CBC with Differential/Platelet - Hepatic function panel - Lipid panel - TSH  2. Essential hypertension - Well controlled. No change in medications  - Basic metabolic panel - CBC with Differential/Platelet - Hepatic function panel - Lipid panel - TSH  3. Moderate persistent asthma without complication  - predniSONE (DELTASONE) 10 MG tablet; 40 mg x 3 days, 20 mg x 3 days, 10 mg x 3 days  Dispense: 21 tablet; Refill: 1 - fluticasone furoate-vilanterol (BREO ELLIPTA) 200-25  MCG/INH AEPB; Inhale 1 puff into the lungs daily.  Dispense: 60 each; Refill: 11 - albuterol (PROVENTIL) (5 MG/ML) 0.5% nebulizer solution; Take 0.5 mLs (2.5 mg total) by nebulization every 6 (six) hours as needed for wheezing or shortness of breath.  Dispense: 20 mL; Refill: 11  [name], NP

## 2017-06-22 NOTE — Telephone Encounter (Signed)
Copied from [place] [id number]. Topic: Quick Communication - See Telephone Encounter >> Jun 22, 2017 11:35 AM Ivar Drape wrote: CRM for notification. See Telephone encounter for:  06/22/17. Judson Roch w/[facility] [phone] would like to clarify the prescription Albuterol Nebulizer solution for the patient.

## 2017-08-30 ENCOUNTER — Other Ambulatory Visit: Payer: Self-pay | Admitting: Adult Health

## 2017-08-31 NOTE — Telephone Encounter (Signed)
Glenda Bauer, you have not prescribed in the past.  Please advise.

## 2017-08-31 NOTE — Telephone Encounter (Signed)
Sent to the pharmacy by e-scribe. 

## 2017-08-31 NOTE — Telephone Encounter (Signed)
Ok to refill for 6 months 

## 2017-09-12 ENCOUNTER — Other Ambulatory Visit: Payer: Self-pay | Admitting: Adult Health

## 2017-09-14 MED ORDER — ALBUTEROL SULFATE 108 (90 BASE) MCG/ACT IN AEPB: 2.0000 | INHALATION_SPRAY | Freq: Four times a day (QID) | RESPIRATORY_TRACT | 2 refills | Status: DC | PRN

## 2017-09-14 NOTE — Telephone Encounter (Signed)
Glenda Bauer, looks like pt is using ProAir.  I do not see that you have prescribed that for the pt in the past.  Please advise.

## 2017-09-14 NOTE — Telephone Encounter (Signed)
Sent to the pharmacy by e-scribe. 

## 2017-09-14 NOTE — Telephone Encounter (Signed)
OK to refill ProAir x 3

## 2017-10-13 ENCOUNTER — Other Ambulatory Visit: Payer: Self-pay | Admitting: Adult Health

## 2017-10-13 DIAGNOSIS — I10 Essential (primary) hypertension: Secondary | ICD-10-CM

## 2017-10-13 NOTE — Telephone Encounter (Signed)
Sent to the pharmacy by e-scribe. 

## 2017-12-17 ENCOUNTER — Other Ambulatory Visit: Payer: Self-pay | Admitting: Adult Health

## 2017-12-20 NOTE — Telephone Encounter (Signed)
Denied.  Filled on 08/31/17 for 6 months.  Refill request is early.

## 2018-01-06 ENCOUNTER — Other Ambulatory Visit: Payer: Self-pay | Admitting: Adult Health

## 2018-01-06 DIAGNOSIS — Z1231 Encounter for screening mammogram for malignant neoplasm of breast: Secondary | ICD-10-CM

## 2018-02-14 ENCOUNTER — Ambulatory Visit
Admission: RE | Admit: 2018-02-14 | Discharge: 2018-02-14 | Disposition: A | Discharge status: Home / Self Care | Payer: Medicare Other | Source: Ambulatory Visit | Attending: Adult Health | Admitting: Adult Health

## 2018-02-14 DIAGNOSIS — Z1231 Encounter for screening mammogram for malignant neoplasm of breast: Secondary | ICD-10-CM

## 2018-02-14 IMAGING — MG DIGITAL SCREENING BILATERAL MAMMOGRAM WITH TOMO AND CAD
8 series · 8 of 24 positions shown · non-contrast
Comparison: Previous exam(s).

CLINICAL DATA: Screening.

EXAM:
DIGITAL SCREENING BILATERAL MAMMOGRAM WITH TOMO AND CAD

[R MLO synth-2D]
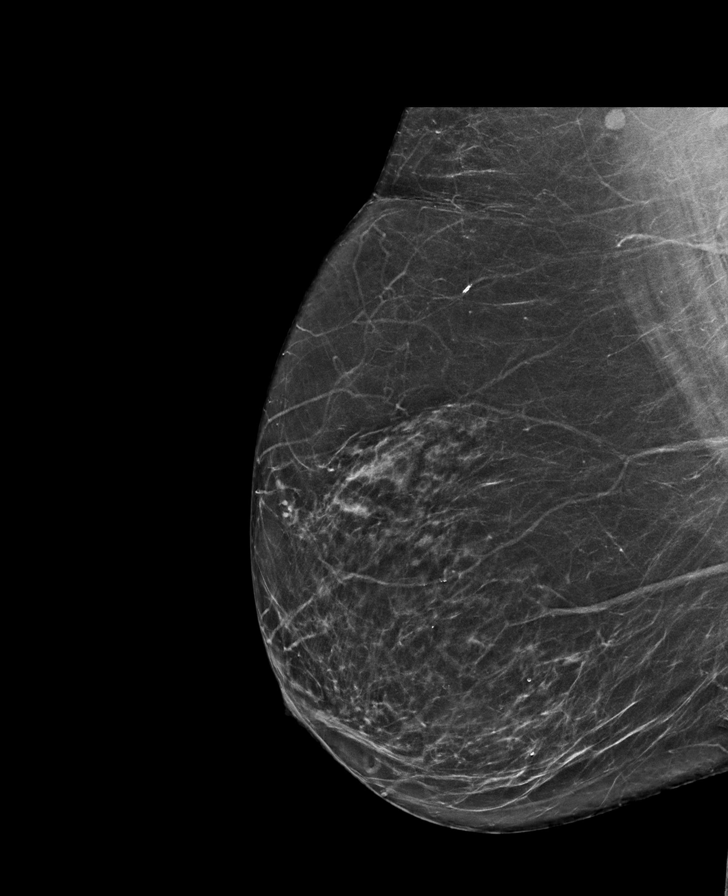

[R CC synth-2D]
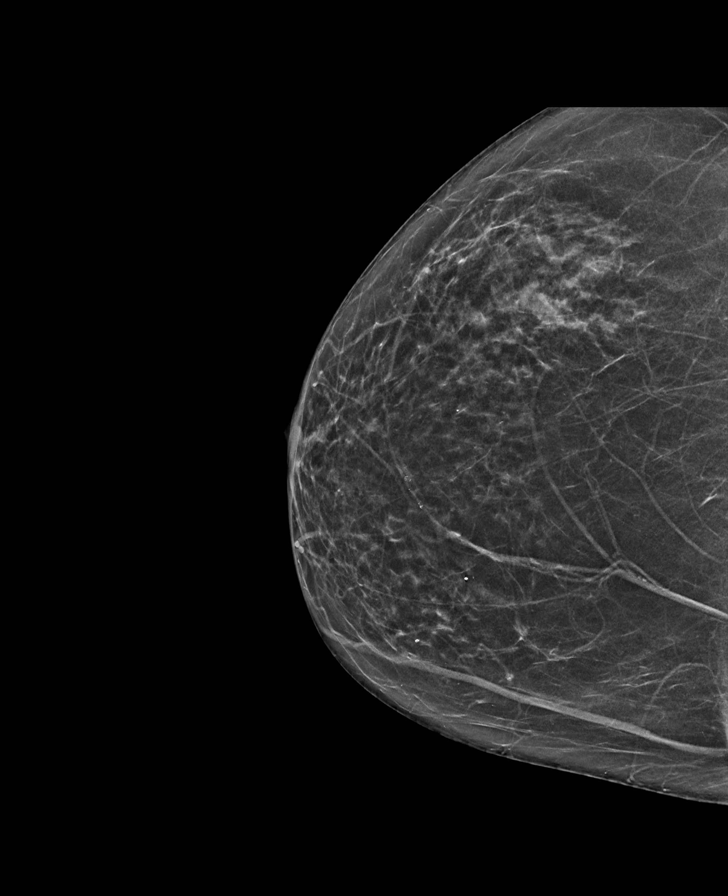

[L MLO synth-2D]
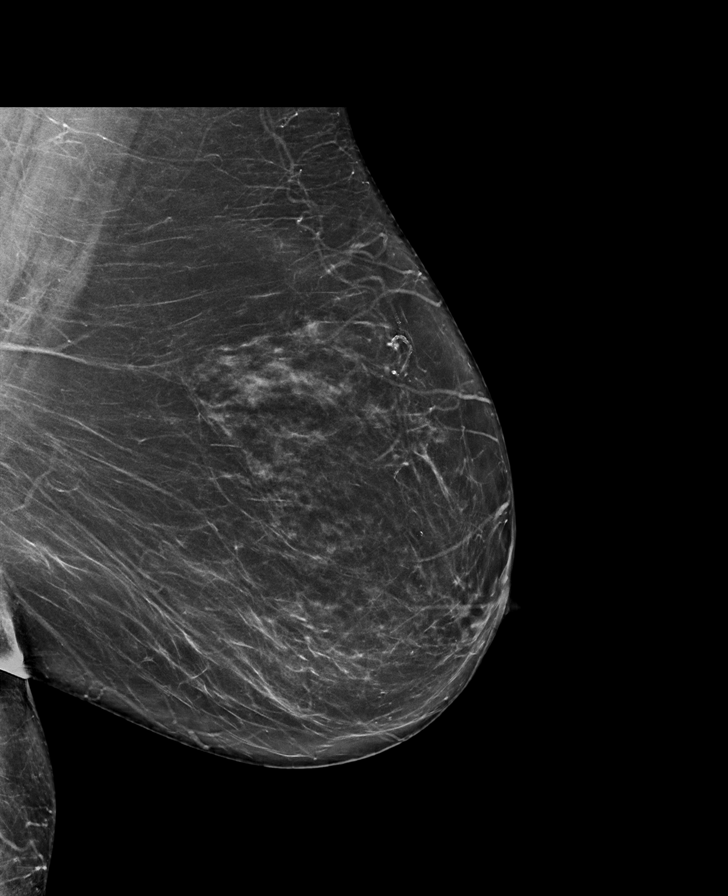

[L CC synth-2D]
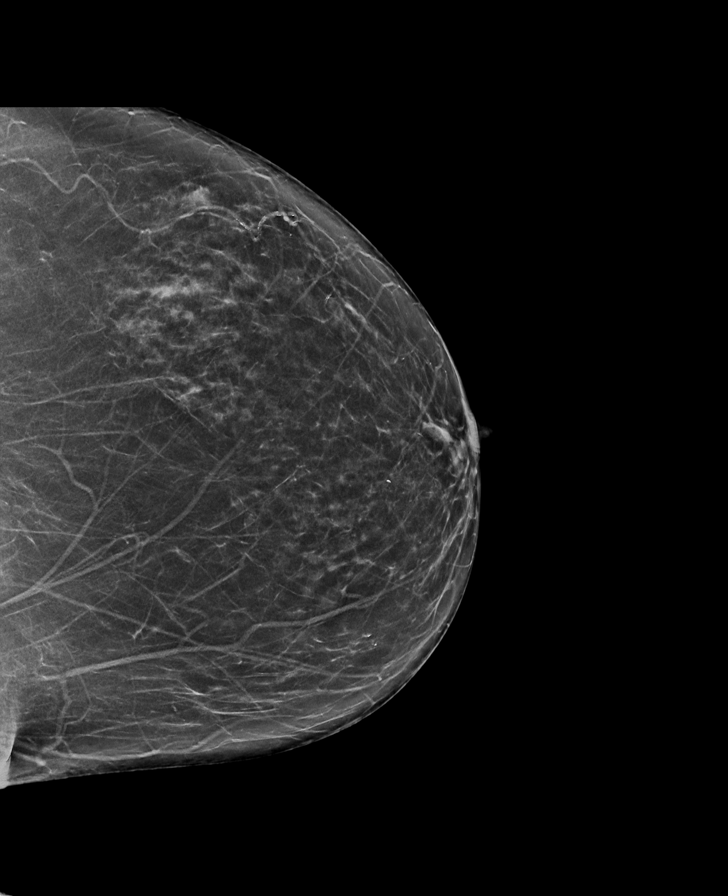

[R MLO tomo · tomo slice 37/73.0]
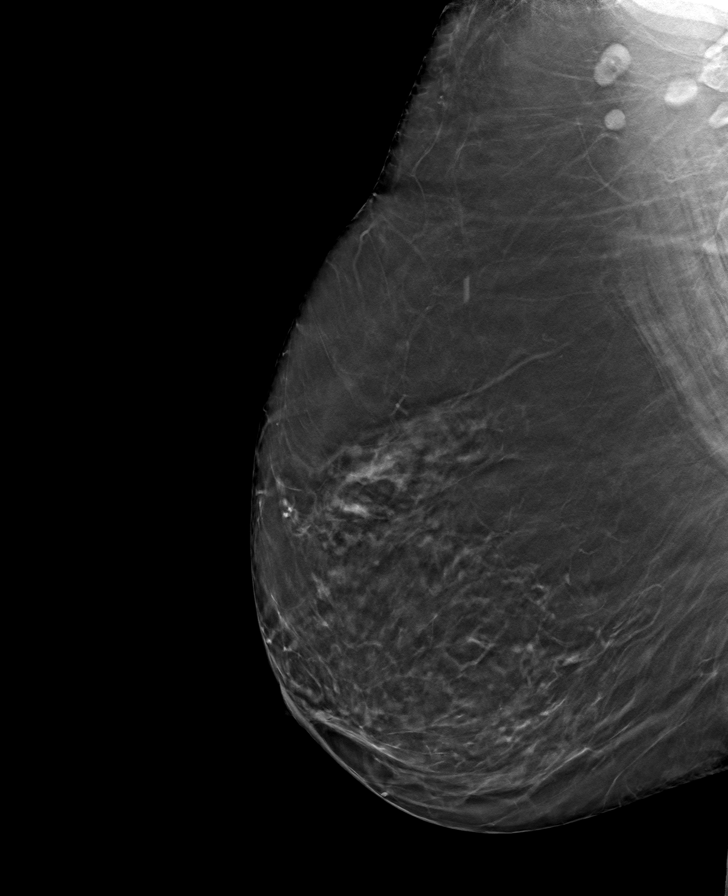

[R CC tomo · tomo slice 31/61.0]
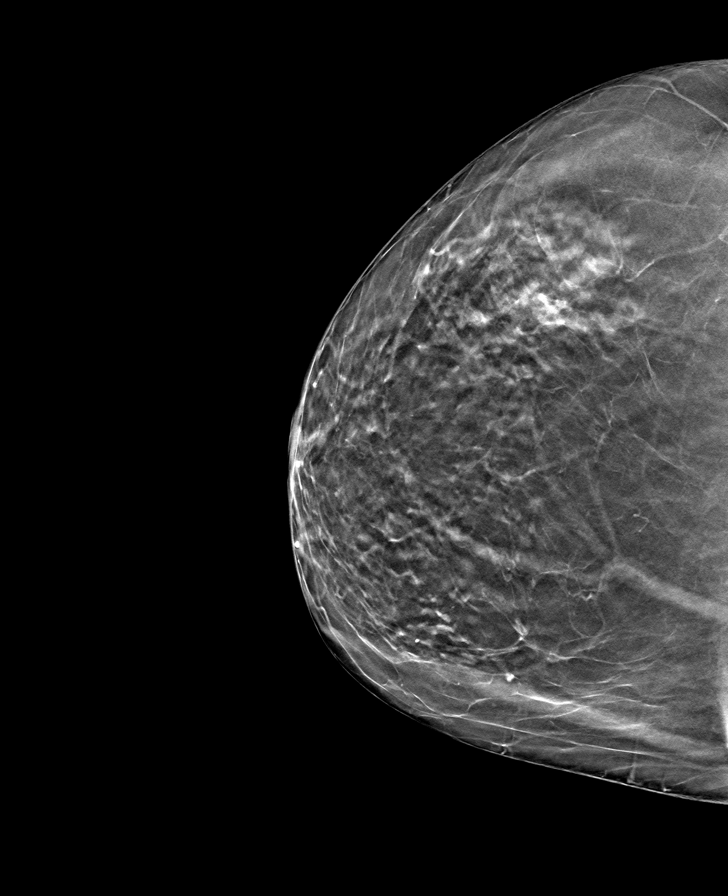

[L MLO tomo · tomo slice 40/79.0]
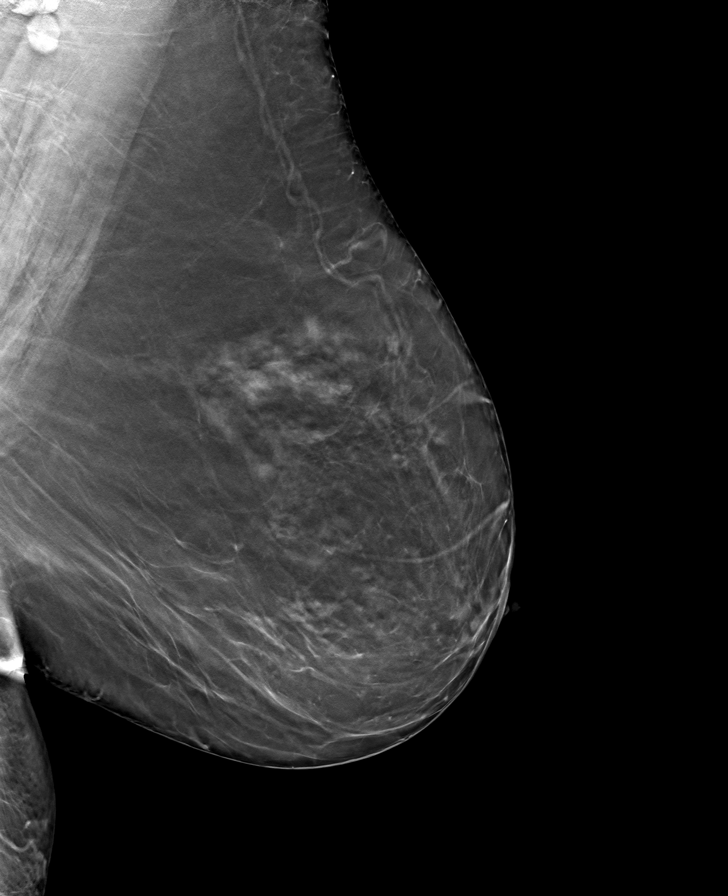

[L CC tomo · tomo slice 37/72.0]
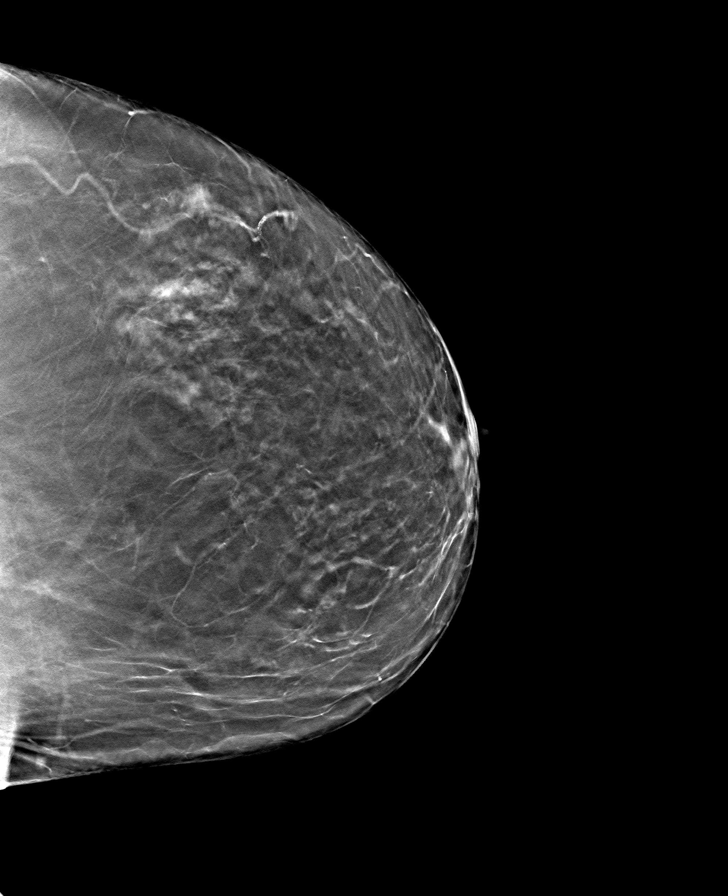

[8 of 24 positions shown; findings below may reference images not displayed]

ACR Breast Density Category c: The breast tissue is heterogeneously
dense, which may obscure small masses.
FINDINGS: There are no findings suspicious for malignancy. Images were
processed with CAD.
IMPRESSION: No mammographic evidence of malignancy. A result letter of this
screening mammogram will be mailed directly to the patient.

RECOMMENDATION:
Screening mammogram in one year. (Code:[5V])

BI-RADS CATEGORY  1: Negative.

## 2018-04-14 ENCOUNTER — Other Ambulatory Visit: Payer: Self-pay | Admitting: Adult Health

## 2018-04-28 ENCOUNTER — Ambulatory Visit: Payer: Medicare Other | Admitting: Nurse Practitioner

## 2018-04-28 ENCOUNTER — Encounter: Payer: Self-pay | Admitting: Nurse Practitioner

## 2018-04-28 VITALS — BP 150/90 | HR 71 | Temp 98.0°F | Ht 66.75 in | Wt 197.0 lb

## 2018-04-28 DIAGNOSIS — J019 Acute sinusitis, unspecified: Secondary | ICD-10-CM | POA: Diagnosis not present

## 2018-04-28 MED ORDER — AMOXICILLIN-POT CLAVULANATE 875-125 MG PO TABS: 1.0000 | ORAL_TABLET | Freq: Two times a day (BID) | ORAL | 0 refills | Status: DC

## 2018-04-28 NOTE — Progress Notes (Signed)
Glenda Bauer is a 67 y.o. female with the following history as recorded in EpicCare:  Patient Active Problem List   Diagnosis Date Noted  . Cyst of knee joint 02/13/2016  . Sinusitis 10/30/2015  . Asthma with exacerbation 10/30/2015  . Sciatica 03/06/2015  . Abdominal pain, right upper quadrant 02/12/2015  . 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 Acute bronchitis 08/06/2014  . Essential hypertension 05/21/2014  . Anxiety and depression 05/21/2014  . Asthma, chronic 05/21/2014    Current Outpatient Medications  Medication Sig Dispense Refill  . albuterol (PROVENTIL) (2.5 MG/3ML) 0.083% nebulizer solution Take 3 mLs (2.5 mg total) by nebulization every 6 (six) hours as needed for wheezing or shortness of breath. 270 mL 11  . Albuterol Sulfate (PROAIR RESPICLICK) 244 (90 Base) MCG/ACT AEPB Inhale 2 puffs into the lungs every 6 (six) hours as needed. 1 each 2  . ALPRAZolam (XANAX) 0.25 MG tablet TAKE 1 TO 2 TABLETS BY MOUTH DAILY AS NEEDED FOR ANXIETY 30 tablet 2  . citalopram (CELEXA) 10 MG tablet TAKE 1 TABLET(10 MG) BY MOUTH DAILY 90 tablet 1  . famotidine (PEPCID) 20 MG tablet Take 20 mg by mouth daily.    . fexofenadine (ALLEGRA) 30 MG tablet Take 30 mg by mouth 2 (two) times daily.    . fluticasone furoate-vilanterol (BREO ELLIPTA) 200-25 MCG/INH AEPB Inhale 1 puff into the lungs daily. 60 each 11  . lisinopril-hydrochlorothiazide (PRINZIDE,ZESTORETIC) 20-12.5 MG tablet TAKE 1 TABLET BY MOUTH DAILY 90 tablet 1  . montelukast (SINGULAIR) 10 MG tablet TAKE 1 TABLET BY MOUTH AT BEDTIME 90 tablet 0  . Multiple Vitamin (MULTIVITAMIN) tablet Take 1 tablet by mouth daily.    . Multiple Vitamins-Minerals (VISION FORMULA PO) Take by mouth.    . predniSONE (DELTASONE) 10 MG tablet 40 mg x 3 days, 20 mg x 3 days, 10 mg x 3 days 21 tablet 1  . triamcinolone cream (KENALOG) 0.1 % Apply 1 application topically 2 (two) times daily. 453.6 g 1  .  amoxicillin-clavulanate (AUGMENTIN) 875-125 MG tablet Take 1 tablet by mouth 2 (two) times daily. 14 tablet 0  . fluticasone (FLONASE) 50 MCG/ACT nasal spray Place 2 sprays into both nostrils daily.     No current facility-administered medications for this visit.     Allergies: Patient has no known allergies.  Past Medical History:  Diagnosis Date  . Allergy   . Anxiety   . Asthma   . Cancer ([facility])    Melanoma and Mycosis Fungosis  . Cutaneous T-cell lymphoma [facility]) 2011   Dermatologist treated in [place]; Dr. Hipolito Bayley  . Depression   . Heart murmur   . Hypertension   . Urinary tract infection     Past Surgical History:  Procedure Laterality Date  . Cataracts Bilateral   . CHOLECYSTECTOMY  2015  . melenoma removal  1979  . Sinus Polyps       Family History  Problem Relation Age of Onset  . Arthritis Mother   . Ovarian cancer Mother   . Breast cancer Mother   . Parkinson's disease Father   . Crohn's disease Brother   . Schizophrenia Brother     Social History   Tobacco Use  . Smoking status: Former Smoker    Packs/day: 0.25    Years: 10.00    Pack years: 2.50    Types: Cigarettes  . Smokeless tobacco: Never Used  . Tobacco comment: 35 years ago; smoke socially  Substance Use Topics  . Alcohol use: No    Alcohol/week: 0.0 standard drinks     Subjective:  Glenda Bauer is here today requesting evaluation of an acute complaint of "sinus symptoms", first began about 2 weeks ago, seeming to get worse/no improvement since onset. She reports frontal headaches, sinus pain and pressure, nasal swelling, burning and itching, nasal congestion and drainage, sore throat, postnasal drip. She has past history of sinus infections, no recent infections, sinus surgery about 3 years ago and has seemed to do well since. She has been using ibuprofen/tylenol, allegra, sinus rinses at home for symptoms with temporary relief. She was using flonase but stopped due to nasal bleeding  after using the flonase.  Review of Systems  Constitutional: Negative for chills and fever.  HENT: Positive for congestion, ear pain, nosebleeds, sinus pain and sore throat. Negative for ear discharge and hearing loss.   Eyes: Negative for blurred vision, double vision, pain and discharge.  Skin: Positive for itching. Negative for rash.  Neurological: Positive for headaches. Negative for dizziness, speech change, loss of consciousness and weakness.  Endo/Heme/Allergies: Positive for environmental allergies.    Objective:  Vitals:   04/28/18 1327  BP: (!) 150/90  Pulse: 71  Temp: 98 F (36.7 C)  TempSrc: Oral  SpO2: 94%  Weight: 197 lb (89.4 kg)  Height: 5' 6.75" (1.695 m)    General: Well developed, well nourished, in no acute distress  Skin : Warm and dry.  Head: Normocephalic and atraumatic  Eyes: Sclera and conjunctiva clear; pupils round and reactive to light; extraocular movements intact  Ears: External normal; canals mildly erythematous bilaterally; tympanic membranes cloudy Nose: frontal and maxillary tenderness Oropharynx: Posterior oropharyngeal erythema. No suspicious lesions.  Neck: Supple without thyromegaly, adenopathy  Lungs: Respirations unlabored; clear to auscultation bilaterally without wheeze, rales, rhonchi  CVS exam: normal rate and regular rhythm, S1 and S2 normal.  Extremities: No edema, cyanosis Vessels: Symmetric bilaterally  Neurologic: Alert and oriented; speech intact; face symmetrical; moves all extremities well; CNII-XII intact without focal deficit  Psychiatric: Normal mood and affect.   Assessment:  1. Acute sinusitis, recurrence not specified, unspecified location     Plan:  Symptoms consistent with sinus infection- augmentin course sent-dosing and side effects discussed Home management, red flags and return precautions including when to seek immediate care discussed and printed on AVS  No follow-ups on file.  No orders of the defined  types were placed in this encounter.   Requested Prescriptions   Signed Prescriptions Disp Refills  . amoxicillin-clavulanate (AUGMENTIN) 875-125 MG tablet 14 tablet 0    Sig: Take 1 tablet by mouth 2 (two) times daily.

## 2018-04-28 NOTE — Patient Instructions (Signed)
augmentin as directed  Please follow up for fevers over 101, if your symptoms get worse, or if your symptoms dont get better  with the antibiotic.   Sinusitis, Adult Sinusitis is soreness and inflammation of your sinuses. Sinuses are hollow spaces in the bones around your face. They are located:  Around your eyes.  In the middle of your forehead.  Behind your nose.  In your cheekbones.  Your sinuses and nasal passages are lined with a stringy fluid (mucus). Mucus normally drains out of your sinuses. When your nasal tissues get inflamed or swollen, the mucus can get trapped or blocked so air cannot flow through your sinuses. This lets bacteria, viruses, and funguses grow, and that leads to infection. Follow these instructions at home: Medicines  Take, use, or apply over-the-counter and prescription medicines only as told by your doctor. These may include nasal sprays.  If you were prescribed an antibiotic medicine, take it as told by your doctor. Do not stop taking the antibiotic even if you start to feel better. Hydrate and Humidify  Drink enough water to keep your pee (urine) clear or pale yellow.  Use a cool mist humidifier to keep the humidity level in your home above 50%.  Breathe in steam for 10-15 minutes, 3-4 times a day or as told by your doctor. You can do this in the bathroom while a hot shower is running.  Try not to spend time in cool or dry air. Rest  Rest as much as possible.  Sleep with your head raised (elevated).  Make sure to get enough sleep each night. General instructions  Put a warm, moist washcloth on your face 3-4 times a day or as told by your doctor. This will help with discomfort.  Wash your hands often with soap and water. If there is no soap and water, use hand sanitizer.  Do not smoke. Avoid being around people who are smoking (secondhand smoke).  Keep all follow-up visits as told by your doctor. This is important. Contact a doctor  if:  You have a fever.  Your symptoms get worse.  Your symptoms do not get better within 10 days. Get help right away if:  You have a very bad headache.  You cannot stop throwing up (vomiting).  You have pain or swelling around your face or eyes.  You have trouble seeing.  You feel confused.  Your neck is stiff.  You have trouble breathing. This information is not intended to replace advice given to you by your health care provider. Make sure you discuss any questions you have with your health care provider. Document Released: 11/10/2007 Document Revised: 01/18/2016 Document Reviewed: 03/19/2015 Elsevier Interactive Patient Education  [employer].

## 2018-06-15 ENCOUNTER — Other Ambulatory Visit: Payer: Self-pay | Admitting: Adult Health

## 2018-06-15 DIAGNOSIS — I10 Essential (primary) hypertension: Secondary | ICD-10-CM

## 2018-06-15 NOTE — Telephone Encounter (Signed)
Needs cpx 

## 2018-06-20 ENCOUNTER — Encounter: Payer: Self-pay | Admitting: Family Medicine

## 2018-06-20 NOTE — Telephone Encounter (Signed)
90 day supply sent to the pharmacy.  Left a message asking the pt to return the call to the office.  CRM created.  Letter mailed also.

## 2018-06-21 ENCOUNTER — Other Ambulatory Visit: Payer: Medicare Other

## 2018-07-10 ENCOUNTER — Other Ambulatory Visit: Payer: Self-pay | Admitting: Adult Health

## 2018-07-11 NOTE — Telephone Encounter (Signed)
Sent to the pharmacy by e-scribe.  Pt scheduled for cpx on 09/12/2018.

## 2018-07-13 ENCOUNTER — Encounter: Payer: Medicare Other | Admitting: Adult Health

## 2018-08-19 ENCOUNTER — Other Ambulatory Visit: Payer: Self-pay | Admitting: Adult Health

## 2018-08-19 DIAGNOSIS — J4540 Moderate persistent asthma, uncomplicated: Secondary | ICD-10-CM

## 2018-08-21 NOTE — Telephone Encounter (Signed)
Sent to the pharmacy by e-scribe.  Pt has upcoming cpx on 09/12/2018.

## 2018-09-08 ENCOUNTER — Other Ambulatory Visit: Payer: Self-pay | Admitting: Adult Health

## 2018-09-08 DIAGNOSIS — I10 Essential (primary) hypertension: Secondary | ICD-10-CM

## 2018-09-10 ENCOUNTER — Other Ambulatory Visit: Payer: Self-pay | Admitting: Adult Health

## 2018-09-11 ENCOUNTER — Other Ambulatory Visit: Payer: Self-pay | Admitting: Adult Health

## 2018-09-11 DIAGNOSIS — J4540 Moderate persistent asthma, uncomplicated: Secondary | ICD-10-CM

## 2018-09-11 NOTE — Telephone Encounter (Signed)
Sent to the pharmacy by e-scribe. 

## 2018-09-12 ENCOUNTER — Encounter: Payer: Medicare Other | Admitting: Adult Health

## 2018-09-12 NOTE — Telephone Encounter (Signed)
Pt now on schedule for virtual visit on 09/13/2018.

## 2018-09-13 ENCOUNTER — Other Ambulatory Visit: Payer: Self-pay

## 2018-09-13 ENCOUNTER — Ambulatory Visit (INDEPENDENT_AMBULATORY_CARE_PROVIDER_SITE_OTHER): Payer: Medicare Other | Admitting: Adult Health

## 2018-09-13 ENCOUNTER — Encounter: Payer: Self-pay | Admitting: Adult Health

## 2018-09-13 DIAGNOSIS — J4541 Moderate persistent asthma with (acute) exacerbation: Secondary | ICD-10-CM

## 2018-09-13 MED ORDER — PREDNISONE 10 MG PO TABS: ORAL_TABLET | ORAL | 1 refills | Status: DC

## 2018-09-13 MED ORDER — MONTELUKAST SODIUM 10 MG PO TABS: 10.0000 mg | ORAL_TABLET | Freq: Every day | ORAL | 3 refills | Status: DC

## 2018-09-13 NOTE — Progress Notes (Signed)
Virtual Visit via Video Note  I connected with Glenda Bauer on 09/13/18 at 10:30 AM EDT by a video enabled telemedicine application and verified that I am speaking with the correct person using two identifiers.  Location patient: home Location provider:work or home office Persons participating in the virtual visit: patient, provider  I discussed the limitations of evaluation and management by telemedicine and the availability of in person appointments. The patient expressed understanding and agreed to proceed.   HPI: This is a 68 year old female who evaluated today for an acute asthma exacerbation.  She reports that her symptoms have been present for approximately 1 month but has started to become progressively worse.  She is using her nebulizer twice a day, usually before walks outside, she is also using her rescue inhaler twice a day, Brio Ellipta once a day and has restarted back on it old prescription of Singulair.  Reports that she is able to walk about a mile and a half without too much short of breath but when she gets to a hill the shortness of breath becomes worse.  She does notice wheezing especially with exhaling and a dry cough that is more apparent in the morning.  Additionally she also reports a feeling of chest tightness in her upper chest.  Patient this feels as though it is her typical asthma exacerbation that she gets approximately once a year.  Denies any fevers, chills, body aches, or feeling acutely ill   ROS: See pertinent positives and negatives per HPI.  Past Medical History:  Diagnosis Date  . Allergy   . Anxiety   . Asthma   . Cancer (HCC)    Melanoma and Mycosis Fungosis  . 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 Hipolito Bayley  . Depression   . Heart murmur   . Hypertension   . Urinary tract infection     Past Surgical History:  Procedure Laterality Date  . Cataracts Bilateral   . CHOLECYSTECTOMY  2015  .  melenoma removal  1979  . Sinus Polyps       Family History  Problem Relation Age of Onset  . Arthritis Mother   . Ovarian cancer Mother   . Breast cancer Mother   . Parkinson's disease Father   . Crohn's disease Brother   . Schizophrenia Brother       Current Outpatient Medications:  .  albuterol (PROVENTIL) (2.5 MG/3ML) 0.083% nebulizer solution, USE 1 VIAL VIA NEBULIZER EVERY 6 HOURS AS NEEDED FOR WHEEZING OR SHORTNESS OF BREATH, Disp: 225 mL, Rfl: 0 .  ALPRAZolam (XANAX) 0.25 MG tablet, TAKE 1 TO 2 TABLETS BY MOUTH DAILY AS NEEDED FOR ANXIETY, Disp: 30 tablet, Rfl: 2 .  amoxicillin-clavulanate (AUGMENTIN) 875-125 MG tablet, Take 1 tablet by mouth 2 (two) times daily., Disp: 14 tablet, Rfl: 0 .  BREO ELLIPTA 200-25 MCG/INH AEPB, INHALE 1 PUFF INTO THE LUNGS DAILY, Disp: 60 each, Rfl: 0 .  citalopram (CELEXA) 10 MG tablet, TAKE 1 TABLET(10 MG) BY MOUTH DAILY, Disp: 90 tablet, Rfl: 1 .  famotidine (PEPCID) 20 MG tablet, Take 20 mg by mouth daily., Disp: , Rfl:  .  fexofenadine (ALLEGRA) 30 MG tablet, Take 30 mg by mouth 2 (two) times daily., Disp: , Rfl:  .  fluticasone (FLONASE) 50 MCG/ACT nasal spray, Place 2 sprays into both nostrils daily., Disp: , Rfl:  .  lisinopril-hydrochlorothiazide (PRINZIDE,ZESTORETIC) 20-12.5 MG tablet, TAKE 1 TABLET BY MOUTH DAILY, Disp: 90 tablet, Rfl: 0 .  montelukast (SINGULAIR) 10 MG tablet, Take 1 tablet (10 mg total) by mouth at bedtime., Disp: 90 tablet, Rfl: 3 .  Multiple Vitamin (MULTIVITAMIN) tablet, Take 1 tablet by mouth daily., Disp: , Rfl:  .  Multiple Vitamins-Minerals (VISION FORMULA PO), Take by mouth., Disp: , Rfl:  .  predniSONE (DELTASONE) 10 MG tablet, 40 mg x 3 days, 20 mg x 3 days, 10 mg x 3 days, Disp: 21 tablet, Rfl: 1 .  PROAIR RESPICLICK 024 (90 Base) MCG/ACT AEPB, INHALE 2 PUFFS INTO THE LUNGS EVERY 6 HOURS AS NEEDED, Disp: 1 each, Rfl: 2 .  triamcinolone cream (KENALOG) 0.1 %, Apply 1 application topically 2 (two) times  daily., Disp: 453.6 g, Rfl: 1  EXAM:  VITALS per patient if applicable:  GENERAL: alert, oriented, appears well and in no acute distress  HEENT: atraumatic, conjunttiva clear, no obvious abnormalities on inspection of external nose and ears  NECK: normal movements of the head and neck  LUNGS: on inspection no signs of respiratory distress, breathing rate appears normal, no obvious gross SOB, gasping or wheezing  CV: no obvious cyanosis  MS: moves all visible extremities without noticeable abnormality  PSYCH/NEURO: pleasant and cooperative, no obvious depression or anxiety, speech and thought processing grossly intact  ASSESSMENT AND PLAN:  Discussed the following assessment and plan:  Moderate persistent asthma with acute exacerbation - Plan: predniSONE (DELTASONE) 10 MG tablet, montelukast (SINGULAIR) 10 MG tablet  Continue to use Brio Ellipta daily and rescue inhaler and nebulizer as needed.    I discussed the assessment and treatment plan with the patient. The patient was provided an opportunity to ask questions and all were answered. The patient agreed with the plan and demonstrated an understanding of the instructions.   The patient was advised to call back or seek an in-person evaluation if the symptoms worsen or if the condition fails to improve as anticipated.   Glenda Peng, NP

## 2018-10-04 ENCOUNTER — Other Ambulatory Visit: Payer: Self-pay | Admitting: Adult Health

## 2018-10-04 DIAGNOSIS — J4540 Moderate persistent asthma, uncomplicated: Secondary | ICD-10-CM

## 2018-10-05 NOTE — Telephone Encounter (Signed)
SENT TO THE PHARMACY BY E-SCRIBE. 

## 2018-10-23 ENCOUNTER — Other Ambulatory Visit: Payer: Self-pay | Admitting: Adult Health

## 2018-11-08 ENCOUNTER — Other Ambulatory Visit: Payer: Self-pay | Admitting: Adult Health

## 2018-11-09 NOTE — Telephone Encounter (Signed)
Sent to the pharmacy by e-scribe for 90 days.  Pt has upcoming cpx on 01/03/2019

## 2018-11-29 ENCOUNTER — Other Ambulatory Visit: Payer: Self-pay | Admitting: Adult Health

## 2018-12-01 ENCOUNTER — Other Ambulatory Visit: Payer: Self-pay | Admitting: Adult Health

## 2018-12-01 DIAGNOSIS — I10 Essential (primary) hypertension: Secondary | ICD-10-CM

## 2019-01-03 ENCOUNTER — Encounter: Payer: Medicare Other | Admitting: Adult Health

## 2019-01-03 NOTE — Progress Notes (Deleted)
Subjective:    Patient ID: Glenda Bauer, female    DOB: November 11, 1950, 68 y.o.   MRN: [id number]  HPI Patient presents for yearly preventative medicine examination. Pleasant 68 year old female who  has a past medical history of Allergy, Anxiety, Asthma, Cancer ([facility]), Cutaneous T-cell lymphoma ([facility]) (2011), Depression, Heart murmur, Hypertension, and Urinary tract infection.  Essential Hypertension -she is currently maintained on lisinopril/hydrochlorothiazide 20-12.5 mg.  Her blood pressure is stable.  She denies chest pain, shortness of breath, dizziness, lightheadedness, or syncopal episodes BP Readings from Last 3 Encounters:  04/28/18 (!) 150/90  06/22/17 122/80  05/23/17 (!) 130/100   Asthma -she is currently maintained with Brio Ellipta, Singulair, albuterol rescue inhaler as needed and albuterol nebulizer as needed.  Anxiety/Depression -currently prescribed Celexa 10 mg daily and Xanax 0.25 mg as needed  Fibromyalgia -takes Celexa 10 mg daily  All immunizations and health maintenance protocols were reviewed with the patient and needed orders were placed. She is due for Prevnar 13  Appropriate screening laboratory values were ordered for the patient including screening of hyperlipidemia, renal function and hepatic function.  Medication reconciliation,  past medical history, social history, problem list and allergies were reviewed in detail with the patient  Goals were established with regard to weight loss, exercise, and  diet in compliance with medications  Wt Readings from Last 3 Encounters:  04/28/18 197 lb (89.4 kg)  06/22/17 193 lb (87.5 kg)  05/23/17 194 lb (88 kg)   End of life planning was discussed.  She is due for routine screening colonoscopy.  She is up-to-date on mammograms and vision screen.    Review of Systems  Constitutional: Negative.   HENT: Negative.   Eyes: Negative.   Respiratory: Negative.   Cardiovascular: Negative.   Gastrointestinal:  Negative.   Endocrine: Negative.   Genitourinary: Negative.   Musculoskeletal: Negative.   Skin: Negative.   Allergic/Immunologic: Negative.   Neurological: Negative.   Hematological: Negative.   Psychiatric/Behavioral: Negative.    Past Medical History:  Diagnosis Date  . Allergy   . Anxiety   . Asthma   . Cancer (HCC)    Melanoma and Mycosis Fungosis  . 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 Hipolito Bayley  . Depression   . Heart murmur   . Hypertension   . Urinary tract infection     Social History   Socioeconomic History  . Marital status: Legally Separated    Spouse name: Not on file  . Number of children: 3  . Years of education: 44  . Highest education level: Not on file  Occupational History  . Occupation: Medical sales representative   Social Needs  . Financial resource strain: Not on file  . Food insecurity    Worry: Not on file    Inability: Not on file  . Transportation needs    Medical: Not on file    Non-medical: Not on file  Tobacco Use  . Smoking status: Former Smoker    Packs/day: 0.25    Years: 10.00    Pack years: 2.50    Types: Cigarettes  . Smokeless tobacco: Never Used  . Tobacco comment: 35 years ago; smoke socially  Substance and Sexual Activity  . Alcohol use: No    Alcohol/week: 0.0 standard drinks  . Drug use: No  . Sexual activity: Not on file  Lifestyle  . Physical activity    Days per week: Not on file    Minutes per  session: Not on file  . Stress: Not on file  Relationships  . Social Herbalist on phone: Not on file    Gets together: Not on file    Attends religious service: Not on file    Active member of club or organization: Not on file    Attends meetings of clubs or organizations: Not on file    Relationship status: Not on file  . Intimate partner violence    Fear of current or ex partner: Not on file    Emotionally abused: Not on file    Physically abused: Not on file    Forced  sexual activity: Not on file  Other Topics Concern  . Not on file  Social History Narrative   Separated    Three children ( one lives locally)     Past Surgical History:  Procedure Laterality Date  . Cataracts Bilateral   . CHOLECYSTECTOMY  2015  . melenoma removal  1979  . Sinus Polyps       Family History  Problem Relation Age of Onset  . Arthritis Mother   . Ovarian cancer Mother   . Breast cancer Mother   . Parkinson's disease Father   . Crohn's disease Brother   . Schizophrenia Brother     No Known Allergies  Current Outpatient Medications on File Prior to Visit  Medication Sig Dispense Refill  . albuterol (PROVENTIL) (2.5 MG/3ML) 0.083% nebulizer solution USE 1 VIAL VIA NEBULIZER EVERY 6 HOURS AS NEEDED FOR WHEEZING OR SHORTNESS OF BREATH 225 mL 0  . ALPRAZolam (XANAX) 0.25 MG tablet TAKE 1 TO 2 TABLETS BY MOUTH DAILY AS NEEDED FOR ANXIETY 30 tablet 2  . amoxicillin-clavulanate (AUGMENTIN) 875-125 MG tablet Take 1 tablet by mouth 2 (two) times daily. 14 tablet 0  . BREO ELLIPTA 200-25 MCG/INH AEPB INHALE 1 PUFF INTO THE LUNGS DAILY 180 each 2  . citalopram (CELEXA) 10 MG tablet TAKE 1 TABLET(10 MG) BY MOUTH DAILY 90 tablet 0  . famotidine (PEPCID) 20 MG tablet Take 20 mg by mouth daily.    . fexofenadine (ALLEGRA) 30 MG tablet Take 30 mg by mouth 2 (two) times daily.    . fluticasone (FLONASE) 50 MCG/ACT nasal spray Place 2 sprays into both nostrils daily.    Marland Kitchen lisinopril-hydrochlorothiazide (ZESTORETIC) 20-12.5 MG tablet TAKE 1 TABLET BY MOUTH DAILY 90 tablet 1  . montelukast (SINGULAIR) 10 MG tablet Take 1 tablet (10 mg total) by mouth at bedtime. 90 tablet 3  . Multiple Vitamin (MULTIVITAMIN) tablet Take 1 tablet by mouth daily.    . Multiple Vitamins-Minerals (VISION FORMULA PO) Take by mouth.    . predniSONE (DELTASONE) 10 MG tablet 40 mg x 3 days, 20 mg x 3 days, 10 mg x 3 days 21 tablet 1  . PROAIR RESPICLICK 701 (90 Base) MCG/ACT AEPB INHALE 2 PUFFS INTO THE  LUNGS EVERY 6 HOURS AS NEEDED 1 each 2  . triamcinolone cream (KENALOG) 0.1 % Apply 1 application topically 2 (two) times daily. 453.6 g 1   No current facility-administered medications on file prior to visit.     There were no vitals taken for this visit.      Objective:   Physical Exam Vitals signs and nursing note reviewed.  Constitutional:      General: She is not in acute distress.    Appearance: Normal appearance. She is normal weight. She is not diaphoretic.  HENT:     Head: Normocephalic and atraumatic.  Right Ear: Tympanic membrane, ear canal and external ear normal. There is no impacted cerumen.     Left Ear: Tympanic membrane, ear canal and external ear normal. There is no impacted cerumen.     Nose: Nose normal. No congestion or rhinorrhea.     Mouth/Throat:     Mouth: Mucous membranes are moist.     Pharynx: Oropharynx is clear. No oropharyngeal exudate or posterior oropharyngeal erythema.  Eyes:     General: No scleral icterus.       Right eye: No discharge.        Left eye: No discharge.     Extraocular Movements: Extraocular movements intact.     Conjunctiva/sclera: Conjunctivae normal.     Pupils: Pupils are equal, round, and reactive to light.  Neck:     Musculoskeletal: Normal range of motion and neck supple.     Thyroid: No thyromegaly.     Vascular: No JVD.     Trachea: No tracheal deviation.  Cardiovascular:     Rate and Rhythm: Normal rate and regular rhythm.     Pulses: Normal pulses.     Heart sounds: Normal heart sounds. No murmur. No friction rub. No gallop.   Pulmonary:     Effort: Pulmonary effort is normal. No respiratory distress.     Breath sounds: Normal breath sounds. No stridor. No wheezing, rhonchi or rales.  Chest:     Chest wall: No tenderness.  Abdominal:     General: Bowel sounds are normal. There is no distension.     Palpations: Abdomen is soft. There is no mass.     Tenderness: There is no abdominal tenderness. There is no  left CVA tenderness, guarding or rebound.     Hernia: No hernia is present.  Musculoskeletal: Normal range of motion.        General: No swelling, tenderness, deformity or signs of injury.     Right lower leg: No edema.     Left lower leg: No edema.  Lymphadenopathy:     Cervical: No cervical adenopathy.  Skin:    General: Skin is warm and dry.     Capillary Refill: Capillary refill takes less than 2 seconds.     Coloration: Skin is not jaundiced or pale.     Findings: No bruising, erythema, lesion or rash.  Neurological:     General: No focal deficit present.     Mental Status: She is alert and oriented to person, place, and time. Mental status is at baseline.     Cranial Nerves: No cranial nerve deficit.     Sensory: No sensory deficit.     Motor: No weakness or abnormal muscle tone.     Coordination: Coordination normal.     Gait: Gait normal.     Deep Tendon Reflexes: Reflexes normal.  Psychiatric:        Mood and Affect: Mood normal.        Behavior: Behavior normal.        Thought Content: Thought content normal.        Judgment: Judgment normal.        Assessment & Plan:

## 2019-02-05 ENCOUNTER — Other Ambulatory Visit: Payer: Self-pay | Admitting: Adult Health

## 2019-02-06 ENCOUNTER — Encounter: Payer: Self-pay | Admitting: Adult Health

## 2019-02-06 ENCOUNTER — Ambulatory Visit (INDEPENDENT_AMBULATORY_CARE_PROVIDER_SITE_OTHER): Payer: Medicare Other | Admitting: Adult Health

## 2019-02-06 VITALS — BP 120/74 | Temp 98.6°F | Ht 66.25 in | Wt 194.0 lb

## 2019-02-06 DIAGNOSIS — Z1159 Encounter for screening for other viral diseases: Secondary | ICD-10-CM

## 2019-02-06 DIAGNOSIS — M150 Primary generalized (osteo)arthritis: Secondary | ICD-10-CM

## 2019-02-06 DIAGNOSIS — E041 Nontoxic single thyroid nodule: Secondary | ICD-10-CM

## 2019-02-06 DIAGNOSIS — Z1211 Encounter for screening for malignant neoplasm of colon: Secondary | ICD-10-CM

## 2019-02-06 DIAGNOSIS — Z0000 Encounter for general adult medical examination without abnormal findings: Secondary | ICD-10-CM | POA: Diagnosis not present

## 2019-02-06 DIAGNOSIS — I10 Essential (primary) hypertension: Secondary | ICD-10-CM | POA: Diagnosis not present

## 2019-02-06 DIAGNOSIS — F329 Major depressive disorder, single episode, unspecified: Secondary | ICD-10-CM

## 2019-02-06 DIAGNOSIS — M1990 Unspecified osteoarthritis, unspecified site: Secondary | ICD-10-CM | POA: Insufficient documentation

## 2019-02-06 DIAGNOSIS — M159 Polyosteoarthritis, unspecified: Secondary | ICD-10-CM

## 2019-02-06 DIAGNOSIS — J4540 Moderate persistent asthma, uncomplicated: Secondary | ICD-10-CM

## 2019-02-06 DIAGNOSIS — F419 Anxiety disorder, unspecified: Secondary | ICD-10-CM

## 2019-02-06 LAB — CBC WITH DIFFERENTIAL/PLATELET
Basophils Absolute: 0.1 10*3/uL (ref 0.0–0.1)
Basophils Relative: 1.4 % (ref 0.0–3.0)
Eosinophils Absolute: 0.3 10*3/uL (ref 0.0–0.7)
Eosinophils Relative: 6.1 % — ABNORMAL HIGH (ref 0.0–5.0)
HCT: 42.9 % (ref 36.0–46.0)
Hemoglobin: 14.8 g/dL (ref 12.0–15.0)
Lymphocytes Relative: 29.4 % (ref 12.0–46.0)
Lymphs Abs: 1.3 10*3/uL (ref 0.7–4.0)
MCHC: 34.5 g/dL (ref 30.0–36.0)
MCV: 88.6 fl (ref 78.0–100.0)
Monocytes Absolute: 0.4 10*3/uL (ref 0.1–1.0)
Monocytes Relative: 9.8 % (ref 3.0–12.0)
Neutro Abs: 2.3 10*3/uL (ref 1.4–7.7)
Neutrophils Relative %: 53.3 % (ref 43.0–77.0)
Platelets: 215 10*3/uL (ref 150.0–400.0)
RBC: 4.84 Mil/uL (ref 3.87–5.11)
RDW: 13 % (ref 11.5–15.5)
WBC: 4.3 10*3/uL (ref 4.0–10.5)

## 2019-02-06 LAB — TSH: TSH: 1.1 u[IU]/mL (ref 0.35–4.50)

## 2019-02-06 LAB — COMPREHENSIVE METABOLIC PANEL
ALT: 36 U/L — ABNORMAL HIGH (ref 0–35)
AST: 23 U/L (ref 0–37)
Albumin: 4 g/dL (ref 3.5–5.2)
Alkaline Phosphatase: 54 U/L (ref 39–117)
BUN: 16 mg/dL (ref 6–23)
CO2: 25 mEq/L (ref 19–32)
Calcium: 8.9 mg/dL (ref 8.4–10.5)
Chloride: 106 mEq/L (ref 96–112)
Creatinine, Ser: 0.85 mg/dL (ref 0.40–1.20)
GFR: 66.11 mL/min (ref >60.00)
Glucose, Bld: 98 mg/dL (ref 70–99)
Potassium: 3.3 mEq/L — ABNORMAL LOW (ref 3.5–5.1)
Sodium: 141 mEq/L (ref 135–145)
Total Bilirubin: 0.8 mg/dL (ref 0.2–1.2)
Total Protein: 6.3 g/dL (ref 6.0–8.3)

## 2019-02-06 LAB — LIPID PANEL
Cholesterol: 161 mg/dL (ref 0–200)
HDL: 55.4 mg/dL (ref >39.00)
LDL Cholesterol: 91 mg/dL (ref 0–99)
NonHDL: 105.25
Total CHOL/HDL Ratio: 3
Triglycerides: 70 mg/dL (ref 0.0–149.0)
VLDL: 14 mg/dL (ref 0.0–40.0)

## 2019-02-06 MED ORDER — FLUOXETINE HCL 20 MG PO CAPS: 20.0000 mg | ORAL_CAPSULE | Freq: Every day | ORAL | 1 refills | Status: DC

## 2019-02-06 NOTE — Progress Notes (Signed)
Subjective:    Patient ID: Adolph Platania, female    DOB: 1950/09/05, 68 y.o.   MRN: [id number]  HPI  Patient presents for yearly preventative medicine examination. 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   Essential Hypertension -she is currently maintained on lisinopril/hydrochlorothiazide 20-12.5 mg.  Blood pressures have been stable in the past.  She denies lightheadedness, dizziness, headaches, blurred vision,or chest pain.   BP Readings from Last 3 Encounters:  02/06/19 120/74  04/28/18 (!) 150/90  06/22/17 122/80    Asthma -currently maintained on Brio Ellipta, Singulair, albuterol nebulizer and rescue inhaler. She denies SOB past baseline.   Anxiety/depression-currently prescribed Celexa 10 mg and Xanax 0.25 mg as needed.  She does feel well controlled but would like to switch to a different SSRI due to sexual side effects of loss of libido.   Osteoarthritis - Right hip and right knee. Pain is worse when going up and down stairs. No pain with ambulation. Walks with a limping gait.   Goiter - had [place] and biopsy " 5-10 years ago" I do not have these records. She reports that the biopsy showed " calcified nodules"   Fibromyalgia -only takes Celexa  All immunizations and health maintenance protocols were reviewed with the patient and needed orders were placed.  She will get her flu shot at the pharmacy.   Appropriate screening laboratory values were ordered for the patient including screening of hyperlipidemia, renal function and hepatic function.   Medication reconciliation,  past medical history, social history, problem list and allergies were reviewed in detail with the patient  Goals were established with regard to weight loss, exercise, and  diet in compliance with medications. She is walking  for 45 minutes daily and has just started doing a 1600 calorie diet to help her lose weight   Wt Readings from Last 3 Encounters:  02/06/19 194 lb (88 kg)  04/28/18 197 lb (89.4 kg)  06/22/17 193 lb (87.5 kg)    End of life planning was discussed.  She is up-to-date on routine vision exams but does not participate in dental exams.  She is due for mammogram this month. She is due for screening colonoscopy.   Review of Systems  Constitutional: Negative.   HENT: Negative.   Eyes: Negative.   Respiratory: Negative.   Cardiovascular: Negative.   Gastrointestinal: Negative.   Endocrine: Negative.   Genitourinary: Negative.   Musculoskeletal: Negative.   Skin: Negative.   Allergic/Immunologic: Negative.   Neurological: Negative.   Hematological: Negative.   Psychiatric/Behavioral: Negative.    Past Medical History:  Diagnosis Date  . Allergy   . Anxiety   . Asthma   . Cancer (HCC)    Melanoma and Mycosis Fungosis  . Cutaneous T-cell lymphoma [facility]) 2011   Dermatologist treated in [place]; Dr. Hipolito Bayley  . Depression   . Heart murmur   . Hypertension   . Urinary tract infection     Social History   Socioeconomic History  . Marital status: Legally Separated    Spouse name: Not on file  . Number of children: 3  . Years of education: 51  . Highest education level: Not on file  Occupational History  . Occupation: Medical sales representative   Social Needs  . Financial resource strain: Not on file  . Food insecurity    Worry: Not  on file    Inability: Not on file  . Transportation needs    Medical: Not on file    Non-medical: Not on file  Tobacco Use  . Smoking status: Former Smoker    Packs/day: 0.25    Years: 10.00    Pack years: 2.50    Types: Cigarettes  . Smokeless tobacco: Never Used  . Tobacco comment: 35 years ago; smoke socially  Substance and Sexual Activity  . Alcohol use: No    Alcohol/week: 0.0 standard drinks  . Drug use: No  . Sexual activity: Not on file   Lifestyle  . Physical activity    Days per week: Not on file    Minutes per session: Not on file  . Stress: Not on file  Relationships  . Social Herbalist on phone: Not on file    Gets together: Not on file    Attends religious service: Not on file    Active member of club or organization: Not on file    Attends meetings of clubs or organizations: Not on file    Relationship status: Not on file  . Intimate partner violence    Fear of current or ex partner: Not on file    Emotionally abused: Not on file    Physically abused: Not on file    Forced sexual activity: Not on file  Other Topics Concern  . Not on file  Social History Narrative   Separated    Three children ( one lives locally)     Past Surgical History:  Procedure Laterality Date  . Cataracts Bilateral   . CHOLECYSTECTOMY  2015  . melenoma removal  1979  . Sinus Polyps       Family History  Problem Relation Age of Onset  . Arthritis Mother   . Ovarian cancer Mother   . Breast cancer Mother   . Parkinson's disease Father   . Crohn's disease Brother   . Schizophrenia Brother     No Known Allergies  Current Outpatient Medications on File Prior to Visit  Medication Sig Dispense Refill  . ALPRAZolam (XANAX) 0.25 MG tablet TAKE 1 TO 2 TABLETS BY MOUTH DAILY AS NEEDED FOR ANXIETY 30 tablet 2  . BREO ELLIPTA 200-25 MCG/INH AEPB INHALE 1 PUFF INTO THE LUNGS DAILY 180 each 2  . citalopram (CELEXA) 10 MG tablet TAKE 1 TABLET(10 MG) BY MOUTH DAILY 90 tablet 0  . famotidine (PEPCID) 20 MG tablet Take 20 mg by mouth daily.    . fluticasone (FLONASE) 50 MCG/ACT nasal spray Place 2 sprays into both nostrils daily.    Marland Kitchen levocetirizine (XYZAL) 5 MG tablet Take 5 mg by mouth every evening.    Marland Kitchen lisinopril-hydrochlorothiazide (ZESTORETIC) 20-12.5 MG tablet TAKE 1 TABLET BY MOUTH DAILY 90 tablet 1  . montelukast (SINGULAIR) 10 MG tablet Take 1 tablet (10 mg total) by mouth at bedtime. 90 tablet 3  . Multiple  Vitamin (MULTIVITAMIN) tablet Take 1 tablet by mouth daily.    . Multiple Vitamins-Minerals (VISION FORMULA PO) Take by mouth.    Marland Kitchen POTASSIUM PO Take by mouth.    Marland Kitchen PROAIR RESPICLICK 123XX123 (90 Base) MCG/ACT AEPB INHALE 2 PUFFS INTO THE LUNGS EVERY 6 HOURS AS NEEDED 1 each 2  . triamcinolone cream (KENALOG) 0.1 % Apply 1 application topically 2 (two) times daily. 453.6 g 1  . albuterol (PROVENTIL) (2.5 MG/3ML) 0.083% nebulizer solution USE 1 VIAL VIA NEBULIZER EVERY 6 HOURS AS NEEDED FOR WHEEZING  OR SHORTNESS OF BREATH (Patient not taking: Reported on 02/06/2019) 225 mL 0   No current facility-administered medications on file prior to visit.     BP 120/74   Temp 98.6 F (37 C) (Temporal)   Ht 5' 6.25" (1.683 m)   Wt 194 lb (88 kg)   BMI 31.08 kg/m       Objective:   Physical Exam Vitals signs and nursing note reviewed.  Constitutional:      General: She is not in acute distress.    Appearance: Normal appearance. She is normal weight. She is not diaphoretic.  HENT:     Head: Normocephalic and atraumatic.     Right Ear: Tympanic membrane, ear canal and external ear normal. There is no impacted cerumen.     Left Ear: Tympanic membrane, ear canal and external ear normal. There is no impacted cerumen.     Nose: Nose normal.     Mouth/Throat:     Mouth: Mucous membranes are moist.     Pharynx: Oropharynx is clear. No oropharyngeal exudate or posterior oropharyngeal erythema.  Eyes:     General: No scleral icterus.       Right eye: No discharge.        Left eye: No discharge.     Extraocular Movements: Extraocular movements intact.     Conjunctiva/sclera: Conjunctivae normal.     Pupils: Pupils are equal, round, and reactive to light.  Neck:     Musculoskeletal: Normal range of motion and neck supple.     Thyroid: Thyroid mass (?), thyromegaly and thyroid tenderness present.     Vascular: No carotid bruit or JVD.     Trachea: No tracheal deviation.  Cardiovascular:     Rate and  Rhythm: Normal rate and regular rhythm.     Heart sounds: Normal heart sounds. No murmur. No friction rub. No gallop.   Pulmonary:     Effort: Pulmonary effort is normal. No respiratory distress.     Breath sounds: Normal breath sounds. No stridor. No wheezing, rhonchi or rales.  Chest:     Chest wall: No tenderness.  Abdominal:     General: Bowel sounds are normal. There is no distension.     Palpations: Abdomen is soft. There is no mass.     Tenderness: There is no abdominal tenderness. There is no right CVA tenderness, left CVA tenderness, guarding or rebound.     Hernia: No hernia is present.  Genitourinary:    Vagina: No vaginal discharge.     Rectum: Guaiac result negative.  Musculoskeletal: Normal range of motion.        General: No swelling, deformity or signs of injury.     Right hip: Normal. She exhibits normal range of motion, normal strength, no tenderness, no bony tenderness, no swelling and no crepitus.     Right knee: She exhibits bony tenderness. She exhibits no swelling, no LCL laxity, normal patellar mobility and no MCL laxity. Tenderness found. LCL and patellar tendon tenderness noted. No medial joint line and no lateral joint line tenderness noted.     Right lower leg: No edema.     Left lower leg: No edema.  Lymphadenopathy:     Cervical: No cervical adenopathy.  Skin:    General: Skin is warm and dry.     Coloration: Skin is not jaundiced or pale.     Findings: No bruising, erythema, lesion or rash.  Neurological:     General: No focal deficit present.  Mental Status: She is alert and oriented to person, place, and time. Mental status is at baseline.     Cranial Nerves: No cranial nerve deficit.     Sensory: No sensory deficit.     Motor: No weakness or abnormal muscle tone.     Coordination: Coordination normal.     Gait: Gait normal.     Deep Tendon Reflexes: Reflexes normal.  Psychiatric:        Mood and Affect: Mood normal.        Behavior: Behavior  normal.        Thought Content: Thought content normal.        Judgment: Judgment normal.       Assessment & Plan:  1. Routine general medical examination at a health care facility - Follow up in one year  - Continue heart healthy diet and exercise  - CBC with Differential/Platelet - Comprehensive metabolic panel - Lipid panel - TSH  2. Essential hypertension - No change in medication.  - CBC with Differential/Platelet - Comprehensive metabolic panel - Lipid panel - TSH  3. Moderate persistent chronic asthma without complication - Continue with current medication  - Well controlled.   4. Anxiety and depression - Will switch to Prozac 20 mg   5. Colon cancer screening  - Ambulatory referral to Gastroenterology  6. Need for hepatitis C screening test  - Hep C Antibody  7. Thyroid nodule  - US Soft Tissue Head/Neck; Future  8. Primary osteoarthritis involving multiple joints  - DG Knee Complete 4 Views Right; Future - DG Hip Unilat W OR W/O Pelvis 2-3 Views Right; Future  [name], NP

## 2019-02-07 ENCOUNTER — Other Ambulatory Visit: Payer: Self-pay | Admitting: Adult Health

## 2019-02-07 LAB — HEPATITIS C ANTIBODY
Hepatitis C Ab: NONREACTIVE
SIGNAL TO CUT-OFF: 0.01 (ref <1.00)

## 2019-02-07 MED ORDER — TERBINAFINE HCL 250 MG PO TABS: 250.0000 mg | ORAL_TABLET | Freq: Every day | ORAL | 1 refills | Status: DC

## 2019-02-08 ENCOUNTER — Other Ambulatory Visit: Payer: Self-pay | Admitting: Adult Health

## 2019-02-09 NOTE — Telephone Encounter (Signed)
DENIED.  PT IS NO LONGER TAKING THIS MEDICATION.  HAS BEEN SWITCHED TO PROZAC.

## 2019-02-15 ENCOUNTER — Ambulatory Visit (INDEPENDENT_AMBULATORY_CARE_PROVIDER_SITE_OTHER)
Admission: RE | Admit: 2019-02-15 | Discharge: 2019-02-15 | Disposition: A | Discharge status: Home / Self Care | Payer: Medicare Other | Source: Ambulatory Visit | Attending: Adult Health | Admitting: Adult Health

## 2019-02-15 ENCOUNTER — Other Ambulatory Visit: Payer: Self-pay

## 2019-02-15 DIAGNOSIS — M159 Polyosteoarthritis, unspecified: Secondary | ICD-10-CM

## 2019-02-15 DIAGNOSIS — M150 Primary generalized (osteo)arthritis: Secondary | ICD-10-CM | POA: Diagnosis not present

## 2019-02-15 IMAGING — DX DG KNEE COMPLETE 4+V*R*
4 series · 4 of 4 positions shown · non-contrast
Comparison: None.

CLINICAL DATA: Right knee pain for the past 2 months. No known
injury.

EXAM:
RIGHT KNEE - COMPLETE 4+ VIEW

[knee ap]
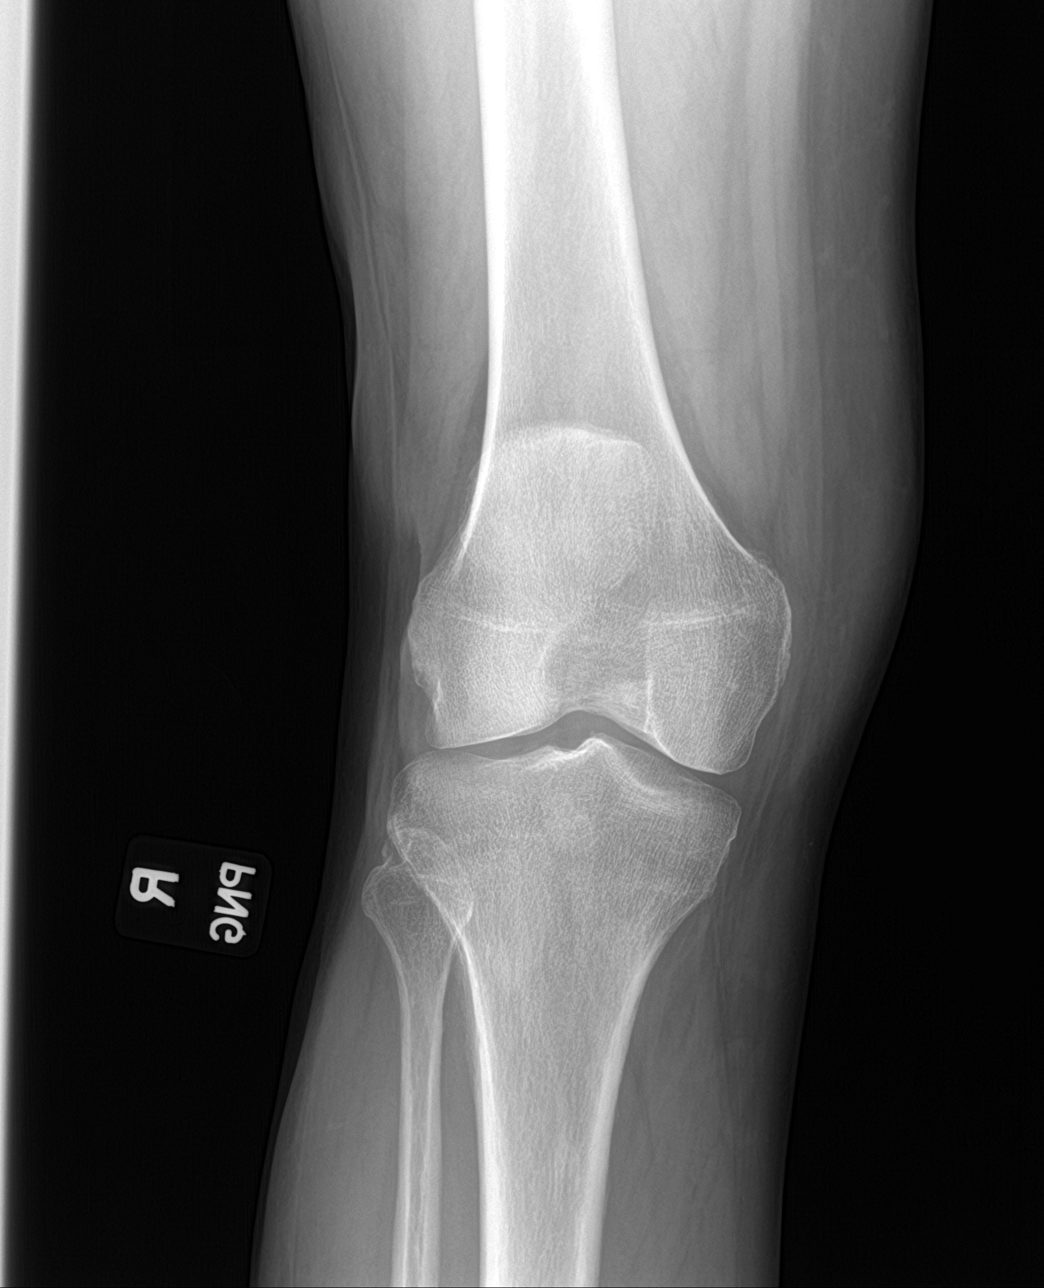

[knee lat]
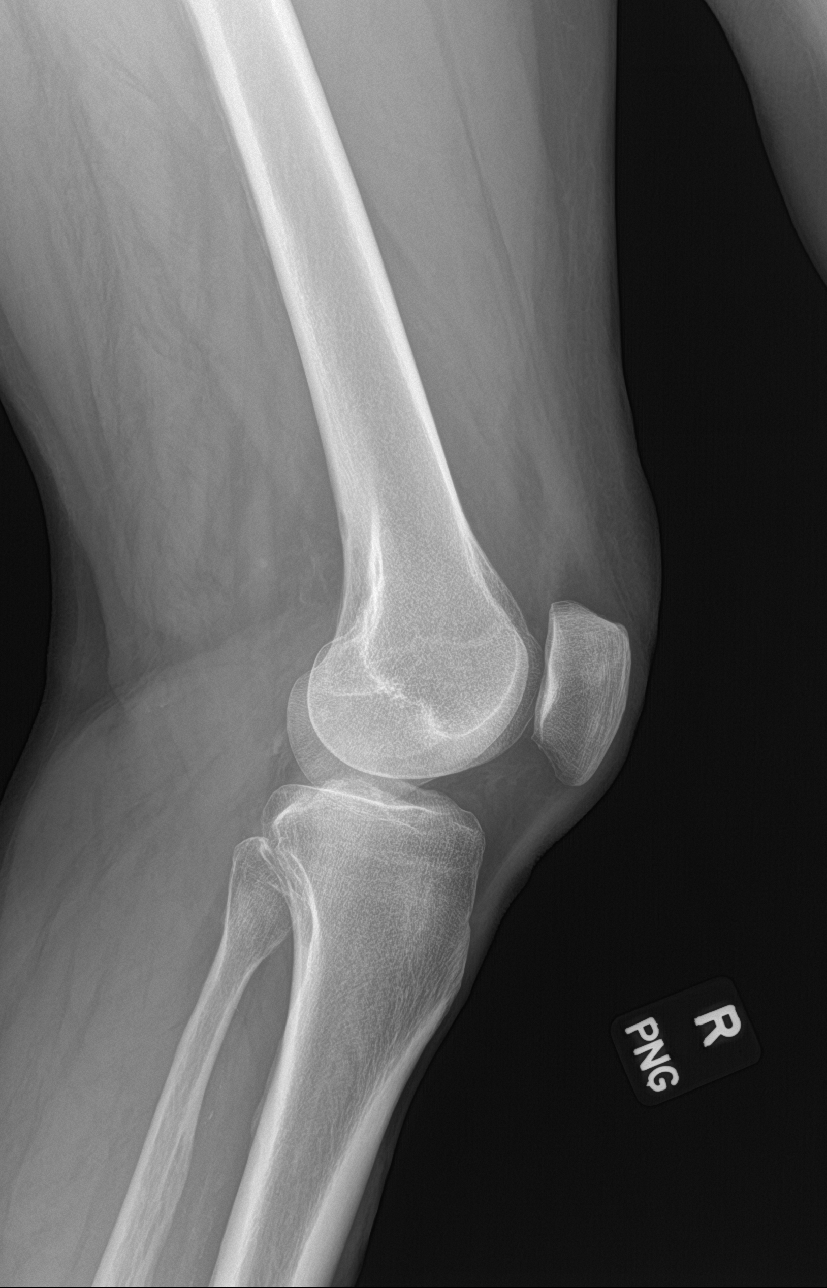

[knee obl (1 of 2)]
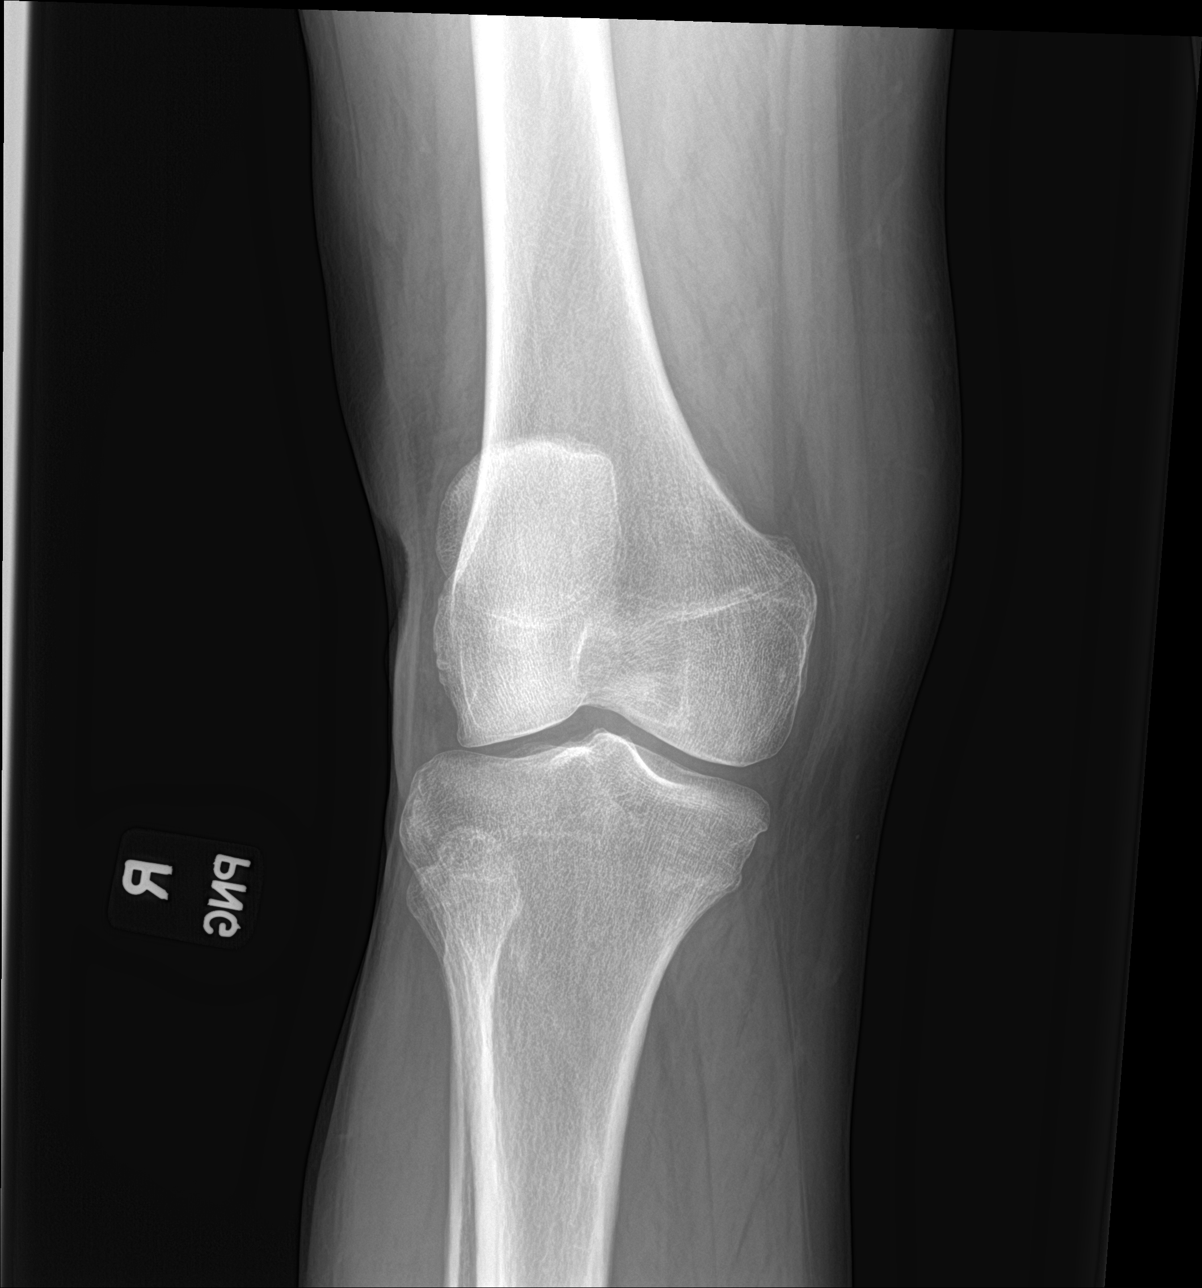

[knee obl (2 of 2)]
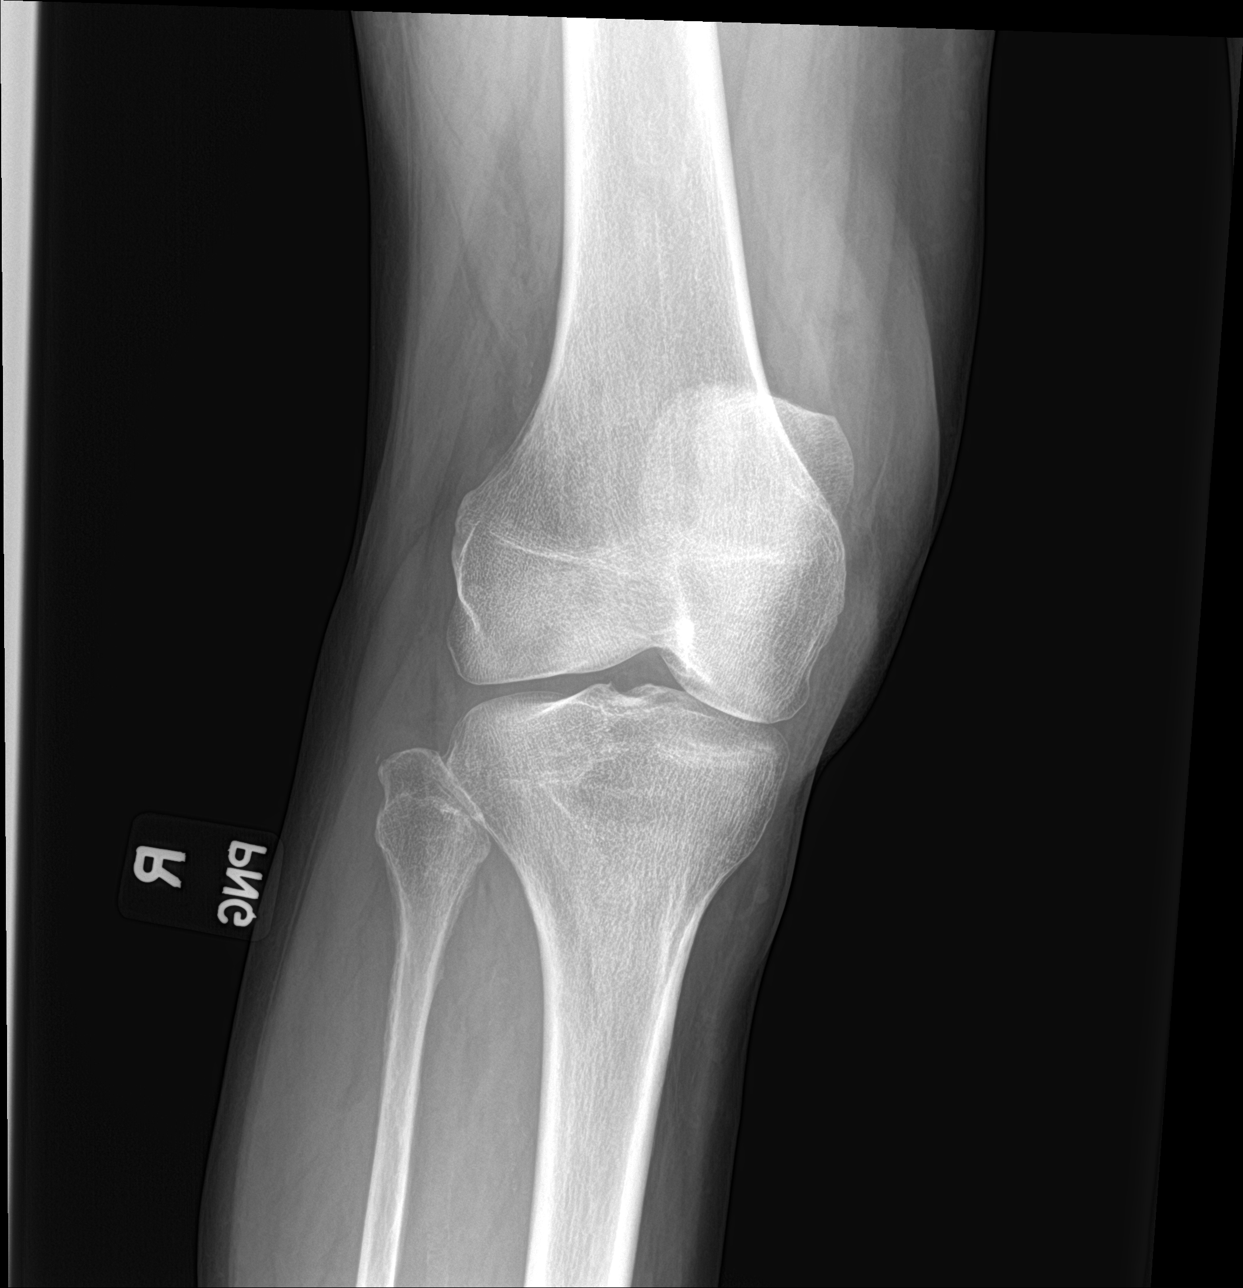

[4 of 4 positions shown; findings below may reference images not displayed]

FINDINGS: No evidence of fracture, dislocation, or joint effusion. No evidence
of arthropathy or other focal bone abnormality. Soft tissues are
unremarkable.
IMPRESSION: Normal examination.

## 2019-02-15 IMAGING — DX DG HIP (WITH OR WITHOUT PELVIS) 2-3V*R*
2 series · 2 of 2 positions shown · non-contrast
Comparison: None.

CLINICAL DATA: Right hip and groin pain for the past month. No
known injury.

EXAM:
DG HIP (WITH OR WITHOUT PELVIS) 2-3V RIGHT

[hip ap]
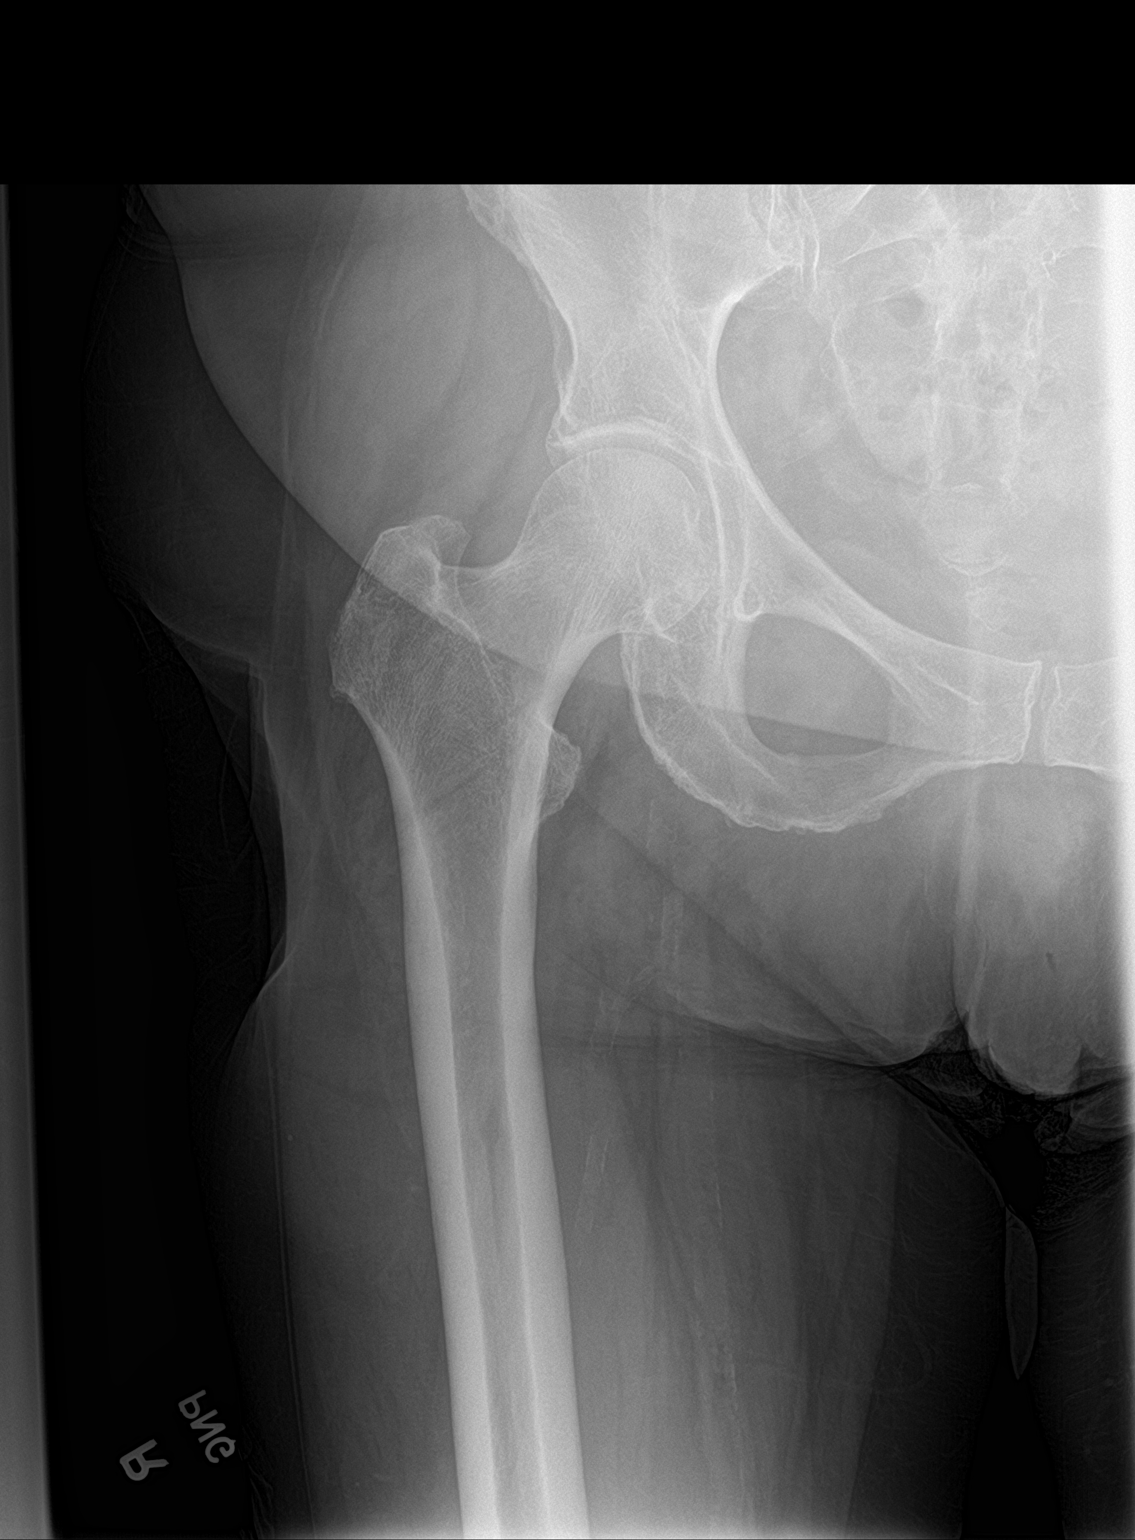

[hip lat]
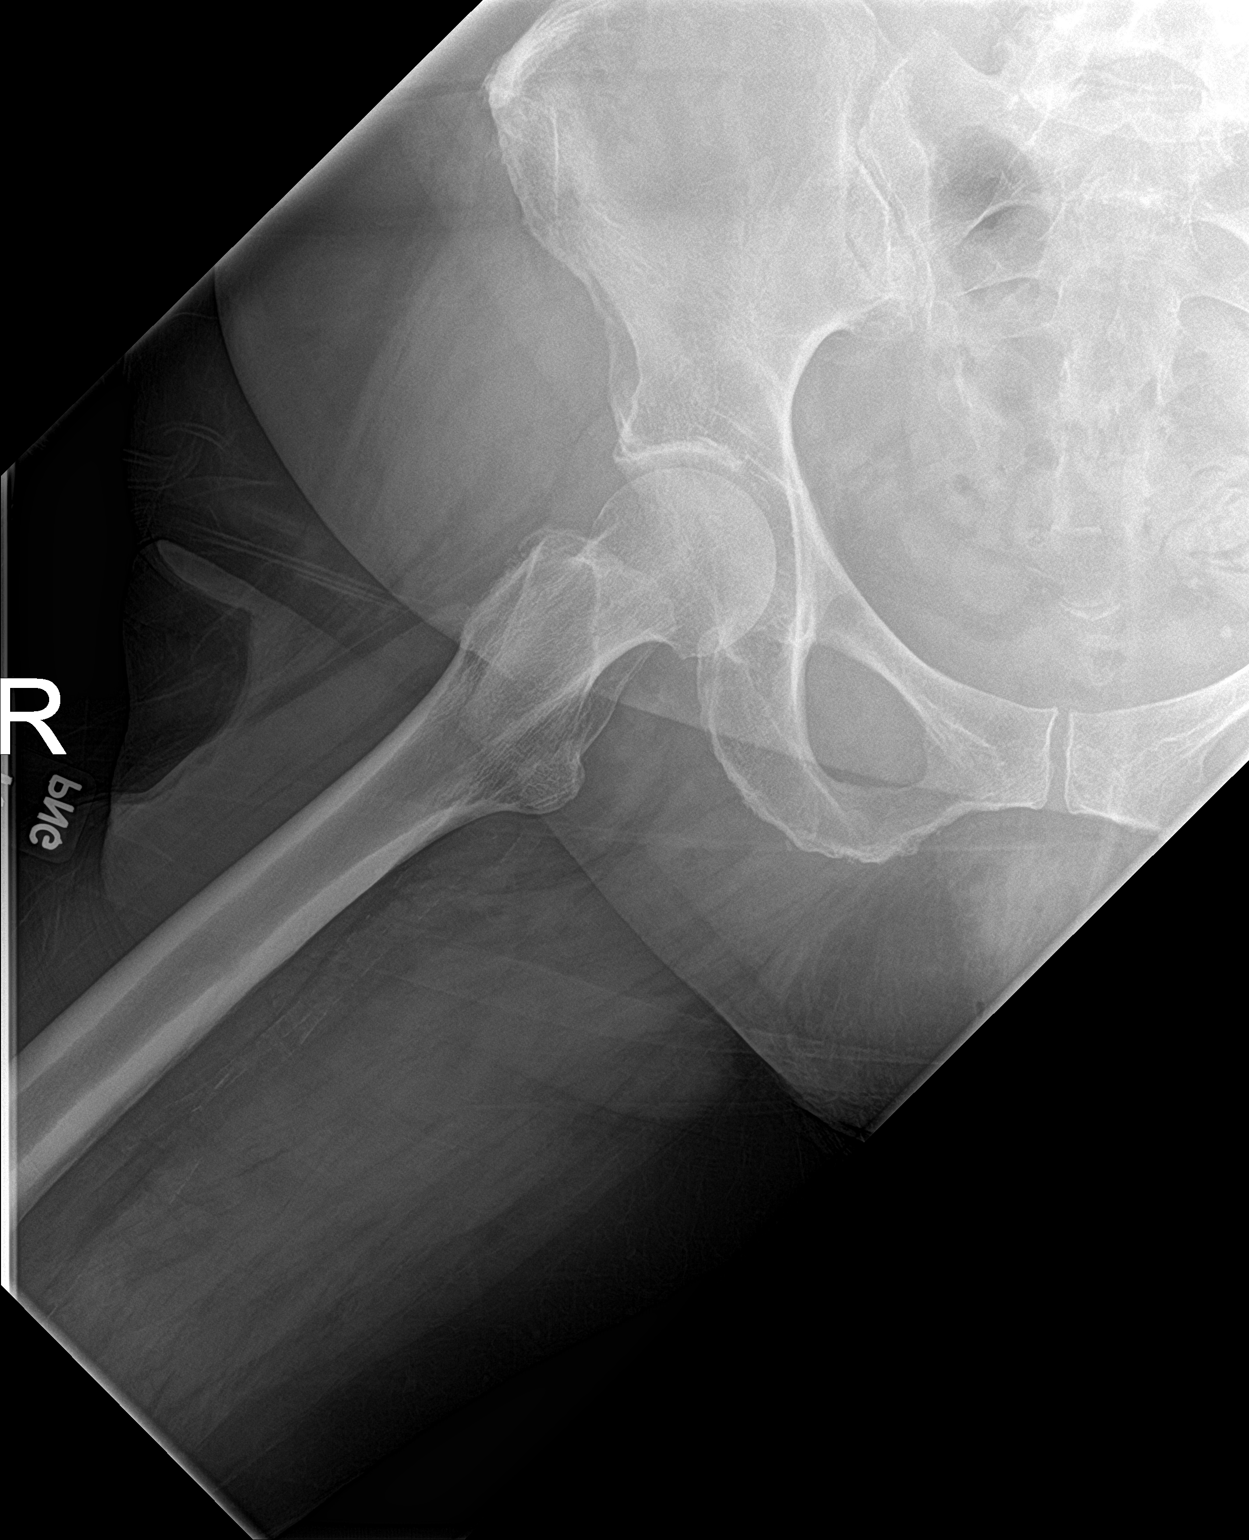

[2 of 2 positions shown; findings below may reference images not displayed]

FINDINGS: There is no evidence of hip fracture or dislocation. There is no
evidence of arthropathy or other focal bone abnormality.
IMPRESSION: Normal examination.

## 2019-03-01 ENCOUNTER — Other Ambulatory Visit: Payer: Self-pay | Admitting: Adult Health

## 2019-03-01 DIAGNOSIS — Z1231 Encounter for screening mammogram for malignant neoplasm of breast: Secondary | ICD-10-CM

## 2019-03-08 ENCOUNTER — Encounter: Payer: Self-pay | Admitting: Adult Health

## 2019-03-09 ENCOUNTER — Ambulatory Visit
Admission: RE | Admit: 2019-03-09 | Discharge: 2019-03-09 | Disposition: A | Discharge status: Home / Self Care | Payer: Medicare Other | Source: Ambulatory Visit | Attending: Adult Health | Admitting: Adult Health

## 2019-03-09 DIAGNOSIS — E041 Nontoxic single thyroid nodule: Secondary | ICD-10-CM

## 2019-03-09 IMAGING — US US THYROID
1 series · 13 of 25 positions shown · non-contrast
Comparison: None.

CLINICAL DATA: 68-year-old female with a history of thyroid nodules

EXAM:
THYROID ULTRASOUND
TECHNIQUE: Ultrasound examination of the thyroid gland and adjacent soft
tissues was performed.

[Series 1: us thyroid · 0.06mm/px · 13 of 50 slices shown]
[im 1/50]
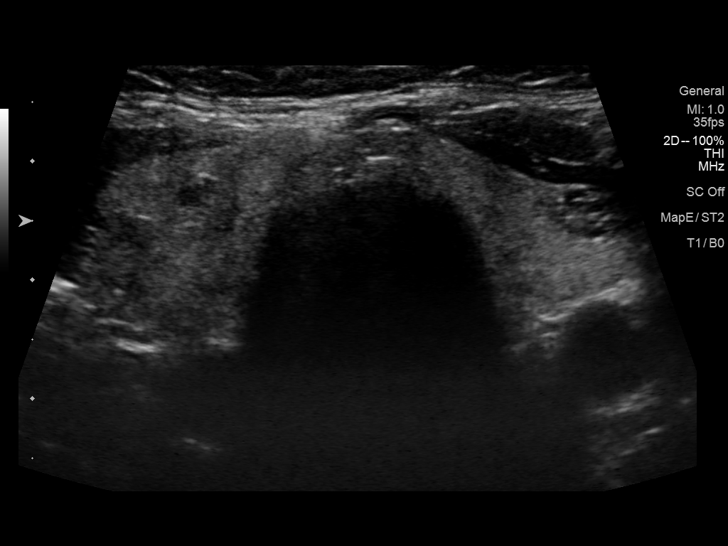
[im 5/50]
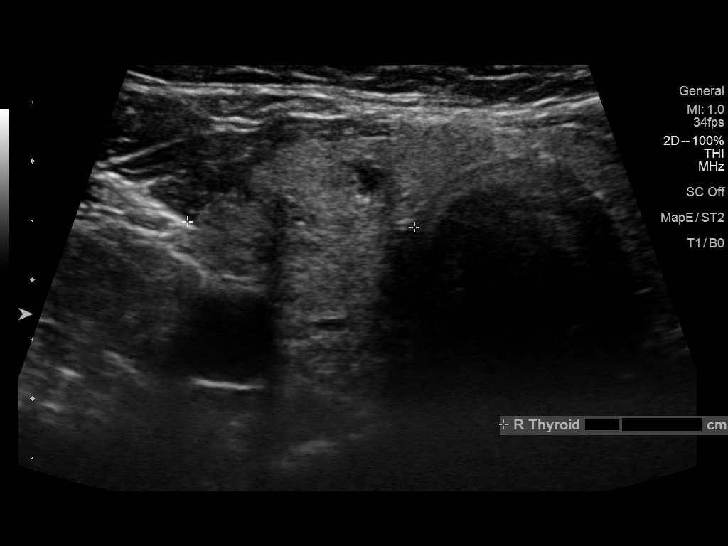
[im 9/50]
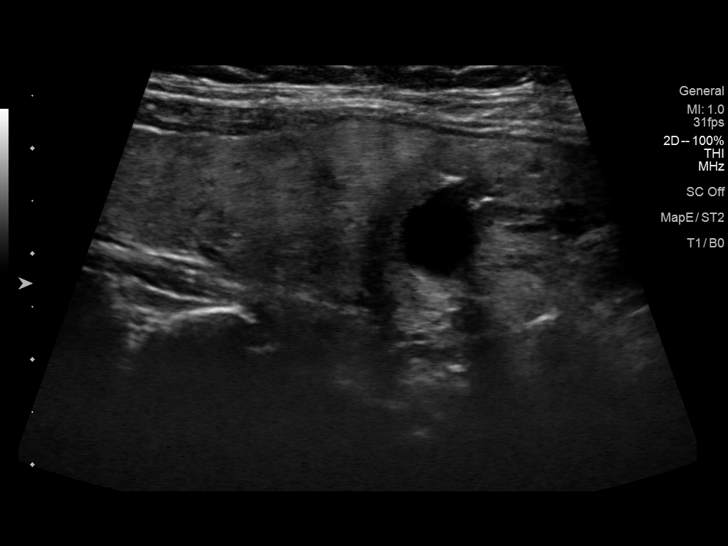
[im 13/50]
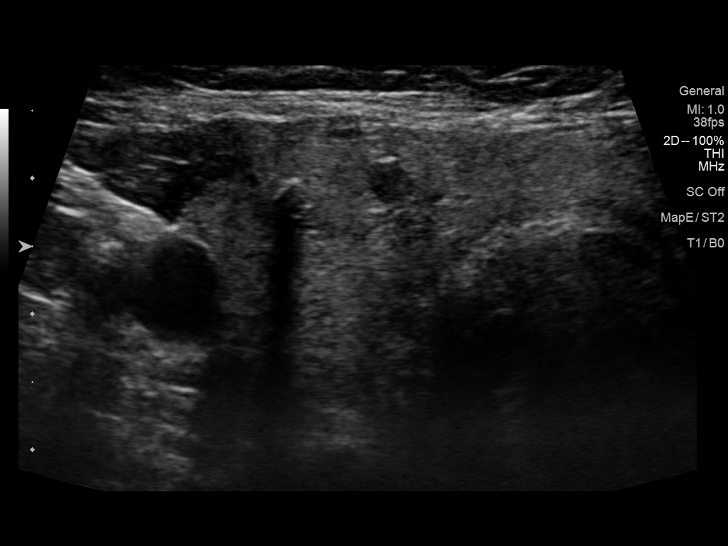
[im 17/50]
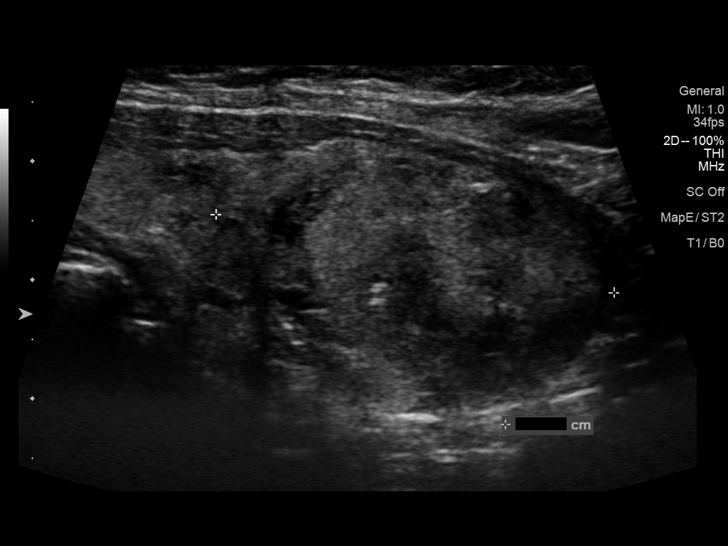
[im 21/50]
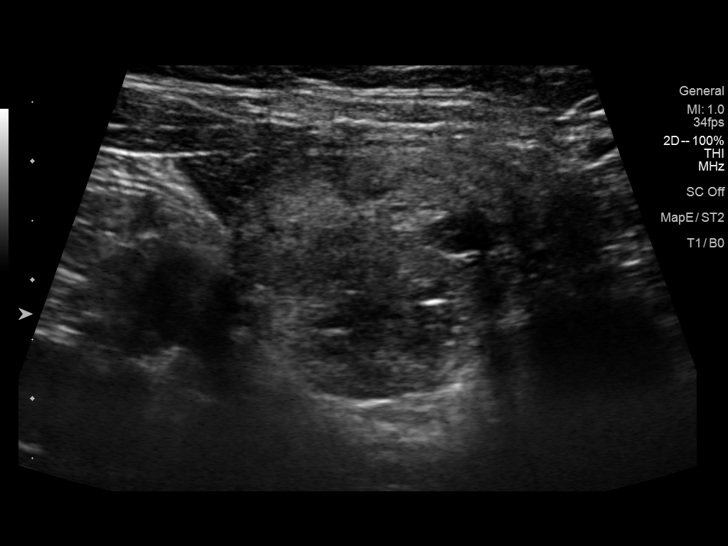
[im 25/50]
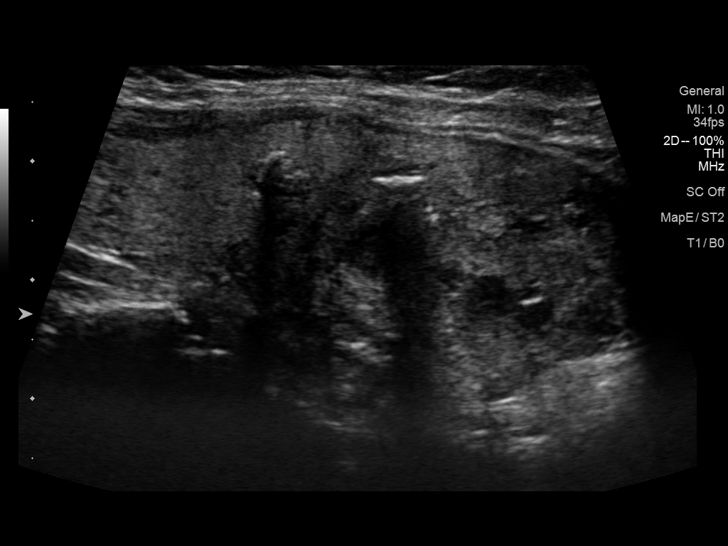
[im 29/50]
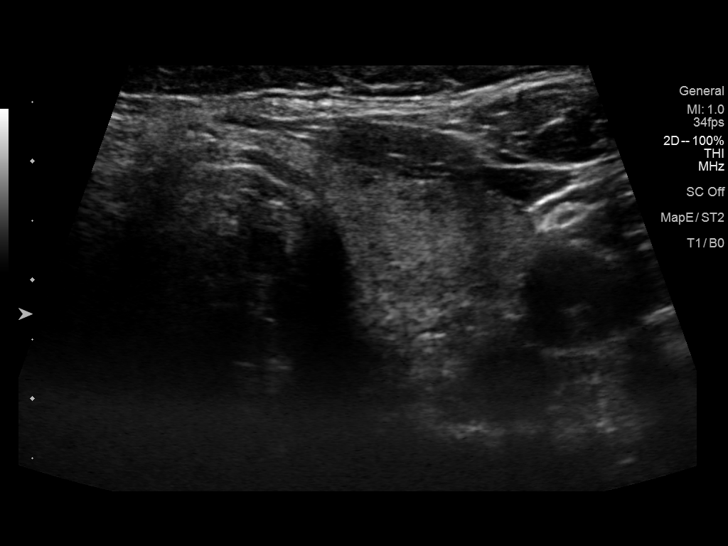
[im 33/50]
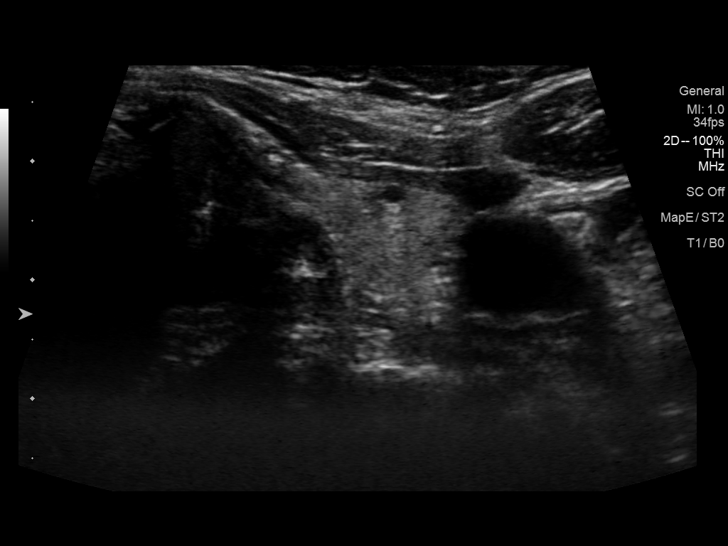
[im 37/50]
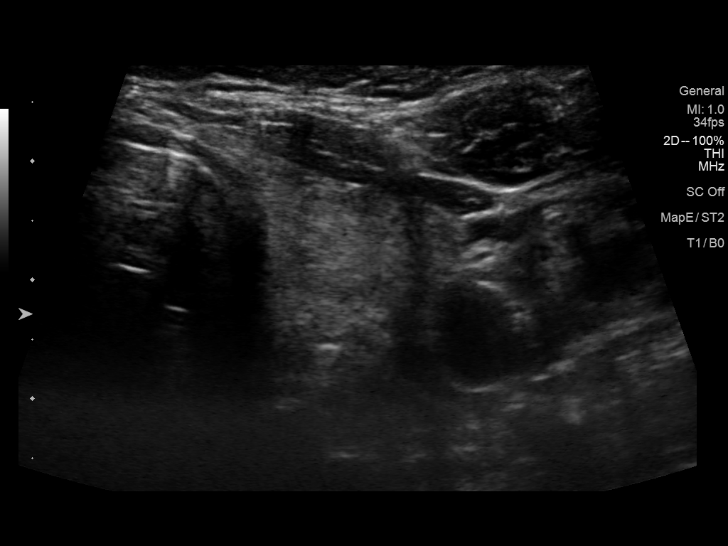
[im 41/50]
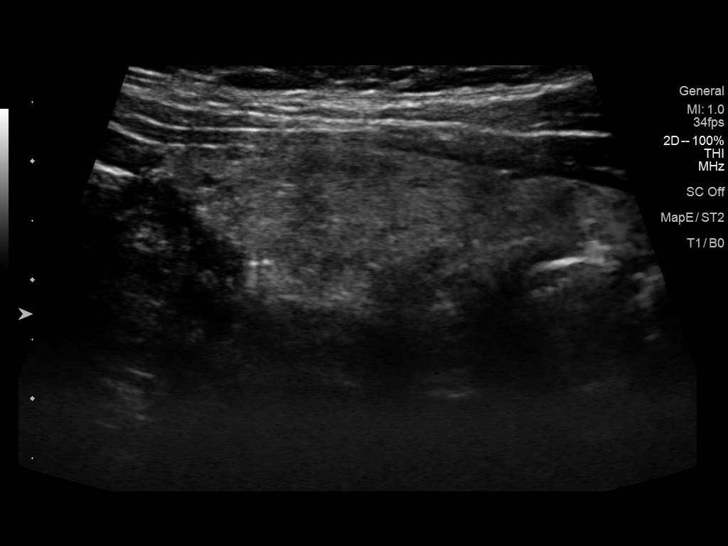
[im 45/50]
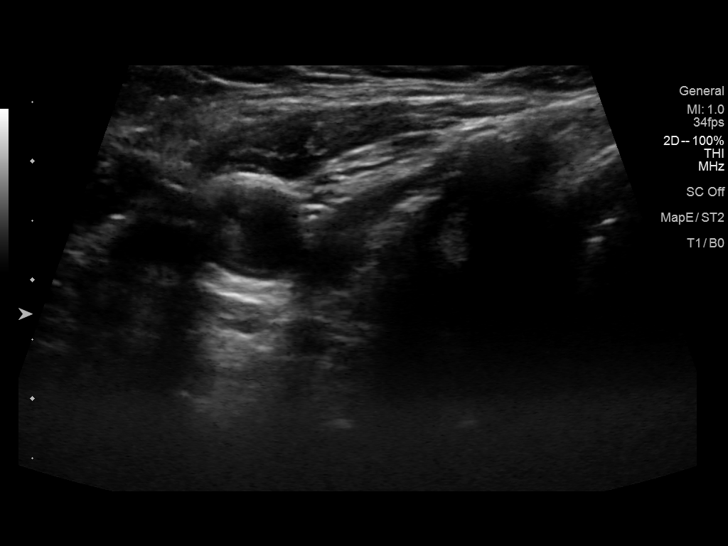
[im 50/50]
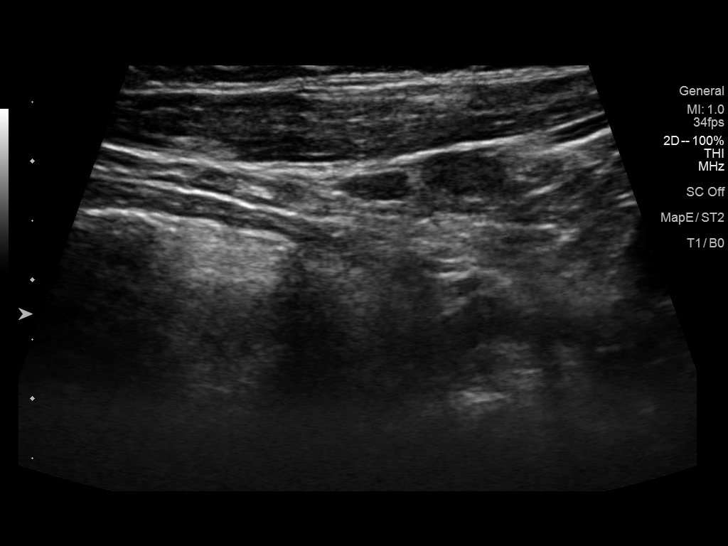

[13 of 25 positions shown; findings below may reference images not displayed]

FINDINGS: Parenchymal Echotexture: Mildly heterogenous

Isthmus: 0.6 cm

Right lobe: 0.5 cm x 2.0 cm x 1.9 cm

Left lobe: 4.4 cm x 1.1 cm x 1.7 cm

_________________________________________________________

Estimated total number of nodules >/= 1 cm: 2

Number of spongiform nodules >/=  2 cm not described below (TR1): 0

Number of mixed cystic and solid nodules >/= 1.5 cm not described
below (TR2): 0

_________________________________________________________

Nodule # 1:

Location: Right; Superior

Maximum size: 1.2 cm; Other 2 dimensions: 1.0 cm x 0.7 cm

Composition: solid/almost completely solid (2)

Echogenicity: isoechoic (1)

Shape: not taller-than-wide (0)

Margins: ill-defined (0)

Echogenic foci: none (0)

ACR TI-RADS total points: 3.

ACR TI-RADS risk category: TR3 (3 points).

ACR TI-RADS recommendations:

Nodule does not meet criteria for surveillance or biopsy

_________________________________________________________

Nodule # 2:

Location: Right; Inferior

Maximum size: 3.4 cm; Other 2 dimensions: 2.5 cm x 2.1 cm

Composition: solid/almost completely solid (2)

Echogenicity: isoechoic (1)

Shape: not taller-than-wide (0)

Margins: ill-defined (0)

Echogenic foci: punctate echogenic foci (3)

ACR TI-RADS total points: 4.

ACR TI-RADS risk category: TR4 (4-6 points).

ACR TI-RADS recommendations:

Nodule meets criteria for biopsy

_________________________________________________________

Nodule # 3:

Location: Left; Mid

Maximum size: 0.9 cm; Other 2 dimensions: 0.9 cm x 0.5 cm

Composition: spongiform (0)

ACR TI-RADS recommendations:

Spongiform nodule does not meet criteria for surveillance or biopsy

_________________________________________________________

No adenopathy
IMPRESSION: Right inferior thyroid nodule (labeled 2) meets criteria for biopsy,
as designated by the newly established ACR TI-RADS criteria, and
referral for biopsy is recommended.

Recommendations follow those established by the new ACR TI-RADS
criteria ([HOSPITAL] [XE];[DATE]).

## 2019-03-10 ENCOUNTER — Other Ambulatory Visit: Payer: Self-pay | Admitting: Adult Health

## 2019-03-13 NOTE — Telephone Encounter (Signed)
DENIED.  PT IS NO LONGER TAKING THIS MEDICATION.  CURRENTLY ON PROZAC.

## 2019-03-15 ENCOUNTER — Other Ambulatory Visit: Payer: Self-pay | Admitting: Adult Health

## 2019-03-15 DIAGNOSIS — E041 Nontoxic single thyroid nodule: Secondary | ICD-10-CM

## 2019-03-19 ENCOUNTER — Encounter: Payer: Self-pay | Admitting: Gastroenterology

## 2019-04-03 ENCOUNTER — Ambulatory Visit (AMBULATORY_SURGERY_CENTER): Payer: Self-pay

## 2019-04-03 ENCOUNTER — Other Ambulatory Visit: Payer: Self-pay

## 2019-04-03 VITALS — Temp 97.3°F | Ht 66.25 in | Wt 180.0 lb

## 2019-04-03 DIAGNOSIS — Z1211 Encounter for screening for malignant neoplasm of colon: Secondary | ICD-10-CM

## 2019-04-03 MED ORDER — NA SULFATE-K SULFATE-MG SULF 17.5-3.13-1.6 GM/177ML PO SOLN: 1.0000 | Freq: Once | ORAL | 0 refills | Status: AC

## 2019-04-03 NOTE — Progress Notes (Signed)
Denies allergies to eggs or soy products. Denies complication of anesthesia or sedation. Denies use of weight loss medication. Denies use of O2.   Emmi instructions given for colonoscopy.   Covid screening is scheduled for Monday 04/16/19 @ 2:00 Pm.

## 2019-04-05 ENCOUNTER — Encounter: Payer: Self-pay | Admitting: Gastroenterology

## 2019-04-10 ENCOUNTER — Ambulatory Visit
Admission: RE | Admit: 2019-04-10 | Discharge: 2019-04-10 | Disposition: A | Discharge status: Home / Self Care | Payer: Medicare Other | Source: Ambulatory Visit | Attending: Adult Health | Admitting: Adult Health

## 2019-04-10 ENCOUNTER — Other Ambulatory Visit (HOSPITAL_COMMUNITY)
Admission: RE | Admit: 2019-04-10 | Discharge: 2019-04-10 | Disposition: A | Discharge status: Home / Self Care | Payer: Medicare Other | Source: Ambulatory Visit | Attending: Radiology | Admitting: Radiology

## 2019-04-10 DIAGNOSIS — E041 Nontoxic single thyroid nodule: Secondary | ICD-10-CM | POA: Diagnosis present

## 2019-04-10 IMAGING — US US FNA BIOPSY THYROID 1ST LESION
1 series · 12 of 12 positions shown · non-contrast
Comparison: Thyroid ultrasound performed [DATE]

MEDICATIONS:
None

COMPLICATIONS:
None immediate.

INDICATION: Indeterminate right inferior thyroid nodule

EXAM:
ULTRASOUND GUIDED FINE NEEDLE ASPIRATION OF INDETERMINATE RIGHT
INFERIOR THYROID NODULE
TECHNIQUE: Informed written consent was obtained from the patient after a
discussion of the risks, benefits and alternatives to treatment.
Questions regarding the procedure were encouraged and answered. A
timeout was performed prior to the initiation of the procedure.

[Series 1: us fna biopsy thyroid 1st lesion · 0.06mm/px · 12 acquisitions, 12 frames shown]
[im 1/12]
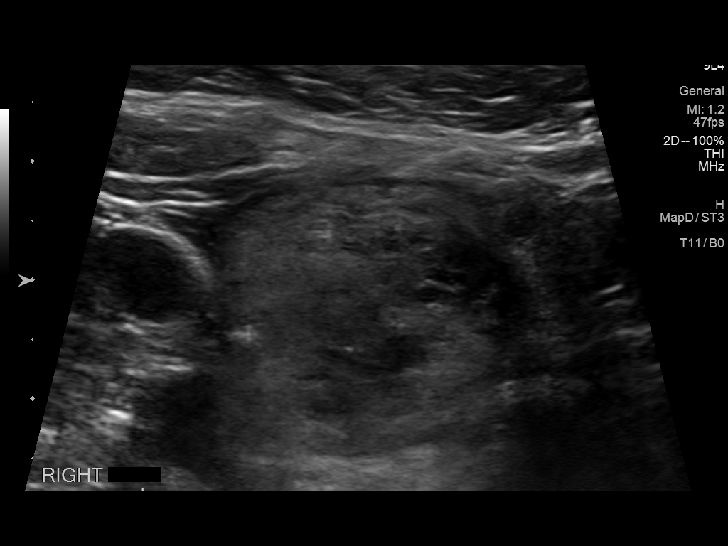
[im 2/12]
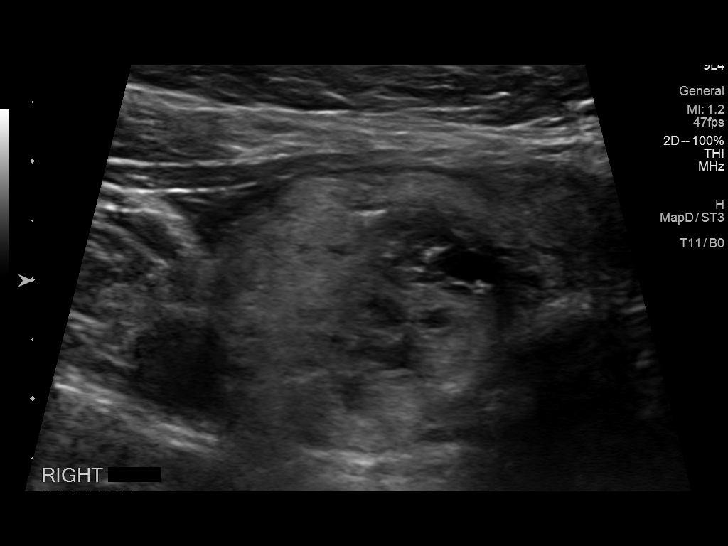
[im 3/12]
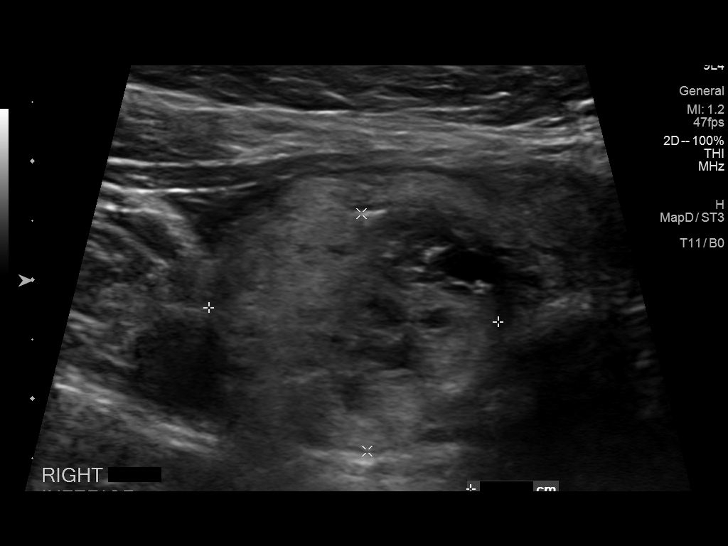
[im 4/12]
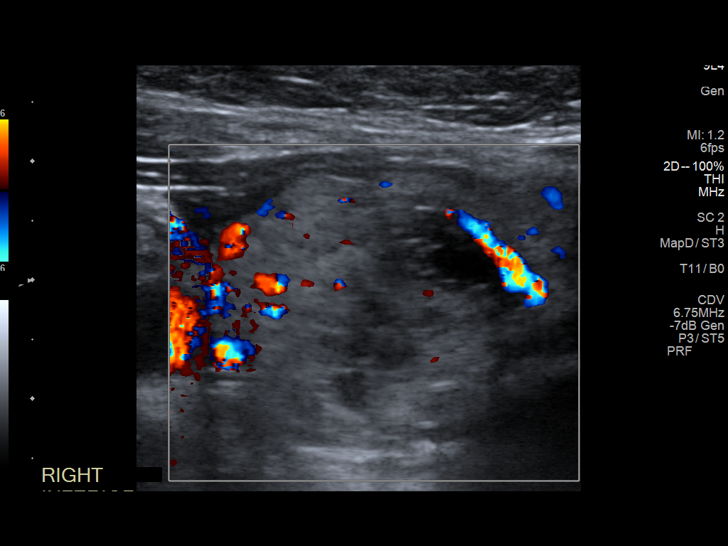
[im 5/12]
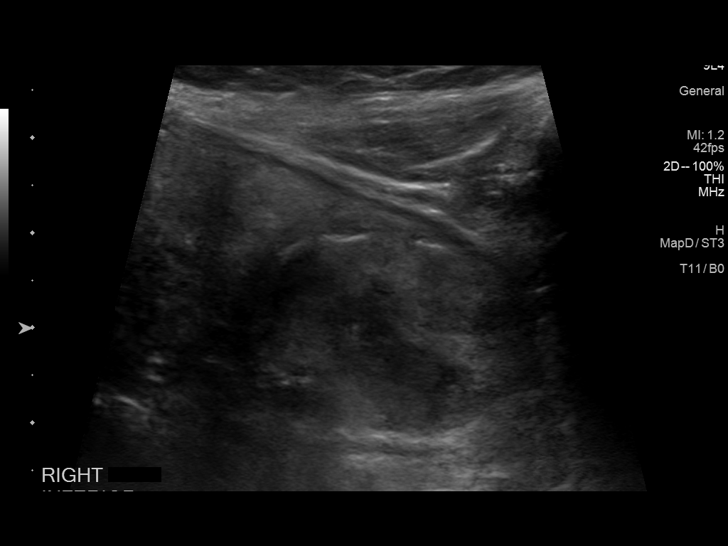
[im 6/12]
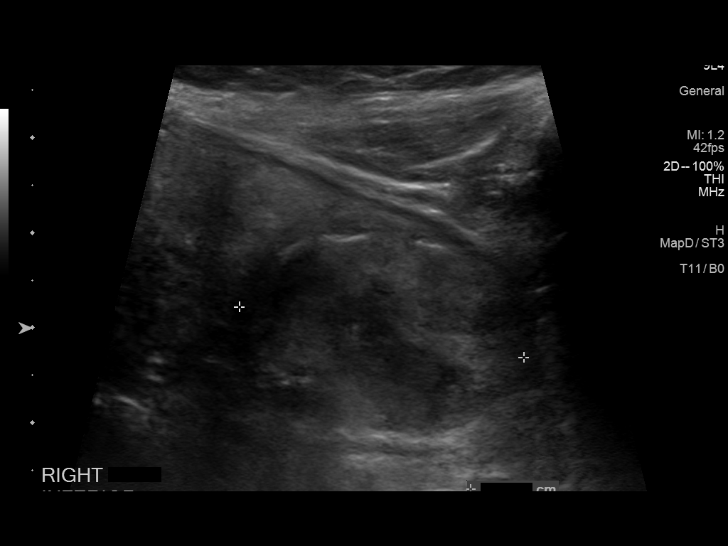
[im 7/12]
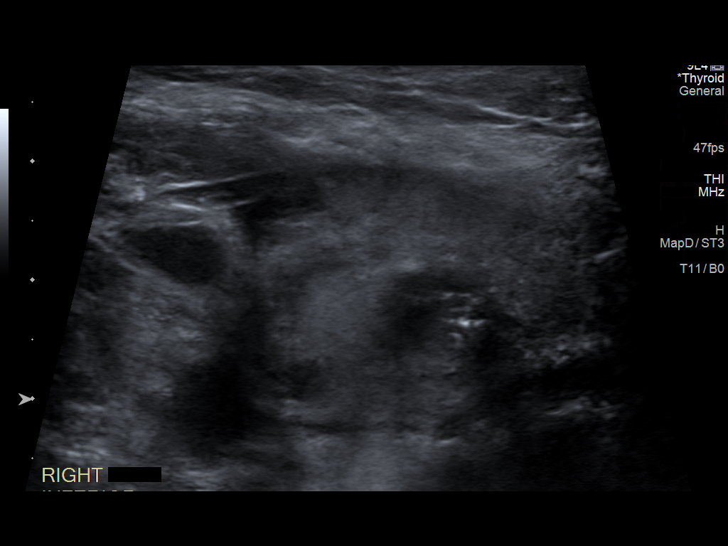
[im 8/12]
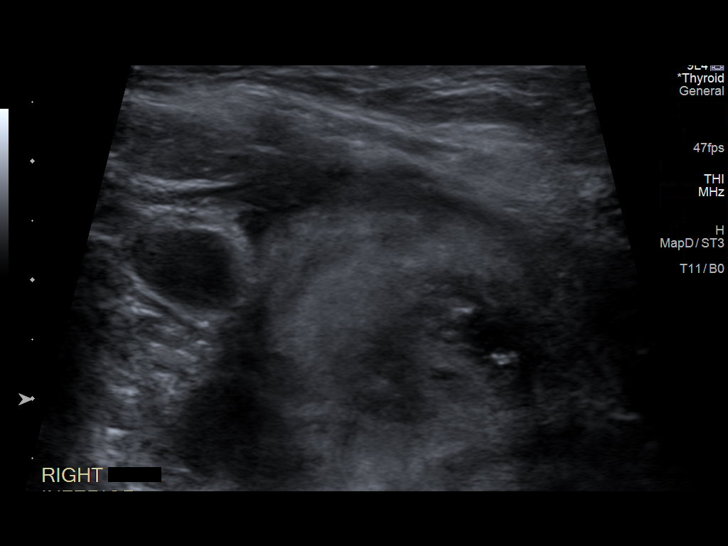
[im 9/12]
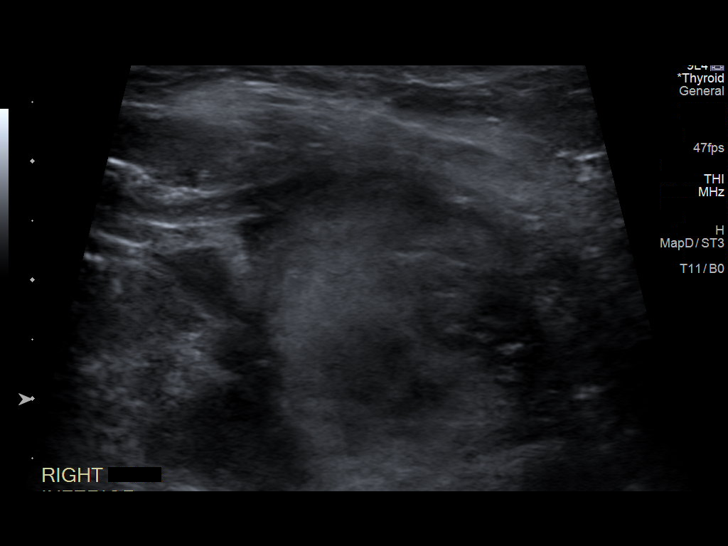
[im 10/12]
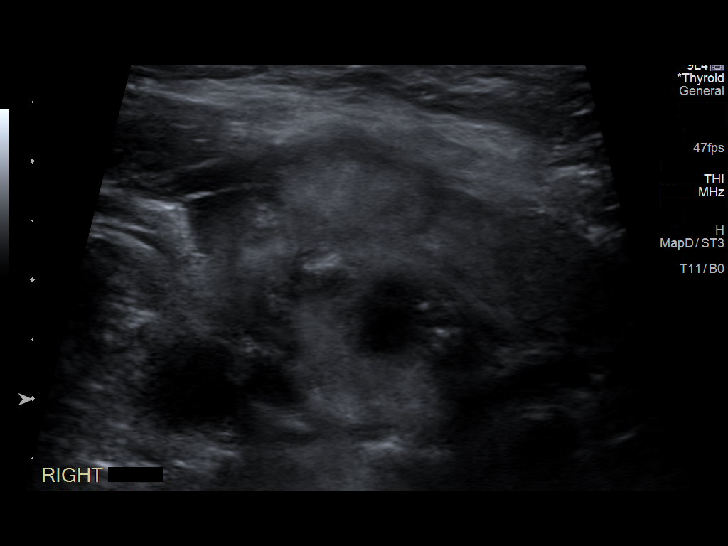
[im 11/12]
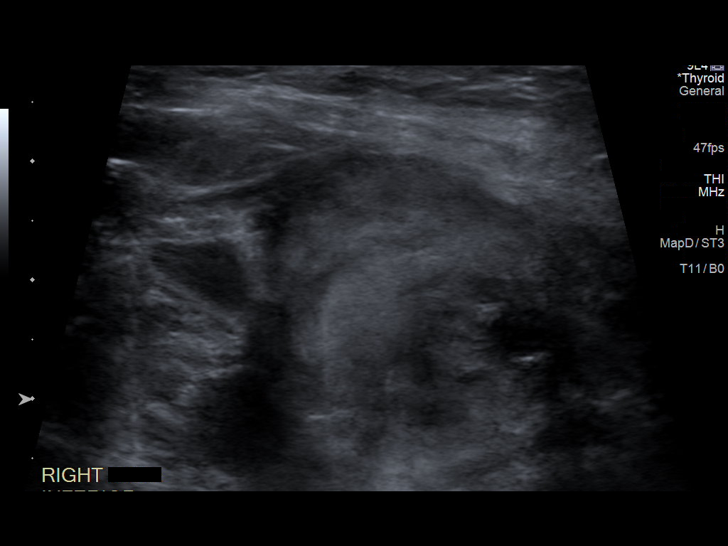
[im 12/12]
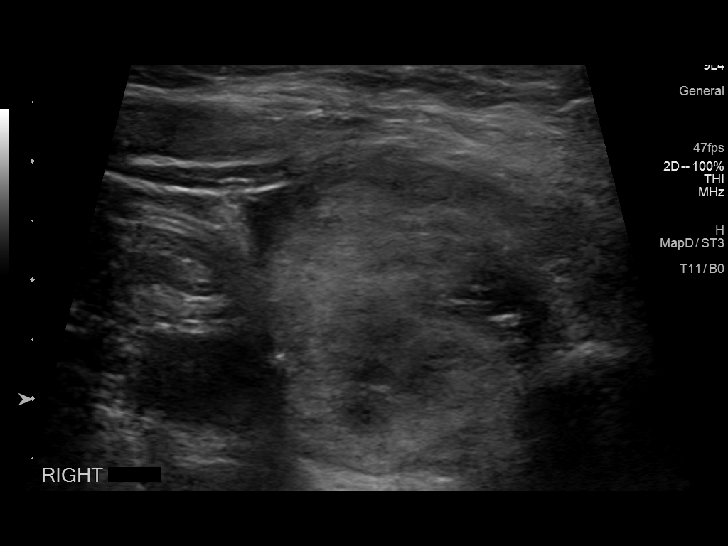

[12 of 12 positions shown; findings below may reference images not displayed]

Pre-procedural ultrasound scanning demonstrated unchanged size and
appearance of the indeterminate nodule within the right inferior
thyroid lobe.

The procedure was planned. The neck was prepped in the usual sterile
fashion, and a sterile drape was applied covering the operative
field. A timeout was performed prior to the initiation of the
procedure. Local anesthesia was provided with 1% lidocaine.

Under direct ultrasound guidance, 5 FNA biopsies were performed of
the right inferior thyroid nodule with a 25 gauge needle. Multiple
ultrasound images were saved for procedural documentation purposes.
The samples were prepared and submitted to pathology.

Limited post procedural scanning was negative for hematoma or
additional complication. Dressings were placed. The patient
tolerated the above procedures procedure well without immediate
postprocedural complication.
FINDINGS: FINDINGS
Nodule reference number based on prior diagnostic ultrasound: 2

Maximum size: 3.4 cm

Location: Right  ;  Inferior

ACR TI-RADS total points: 4

ACR TI-RADS risk category:  TR4 (4-6 points)

Prior biopsy:  No

Reason for biopsy: meets ACR TI-RADS criteria

Ultrasound imaging confirms appropriate placement of the needles
within the thyroid nodule.
IMPRESSION: Technically successful ultrasound guided fine needle aspiration of
right inferior thyroid nodule as described above.

## 2019-04-11 LAB — CYTOLOGY - NON PAP

## 2019-04-16 ENCOUNTER — Other Ambulatory Visit: Payer: Self-pay | Admitting: Gastroenterology

## 2019-04-17 ENCOUNTER — Encounter: Payer: Medicare Other | Admitting: Gastroenterology

## 2019-04-17 LAB — SARS CORONAVIRUS 2 (TAT 6-24 HRS): SARS Coronavirus 2: NEGATIVE

## 2019-04-19 ENCOUNTER — Other Ambulatory Visit: Payer: Self-pay

## 2019-04-19 ENCOUNTER — Ambulatory Visit (AMBULATORY_SURGERY_CENTER): Payer: Medicare Other | Admitting: Gastroenterology

## 2019-04-19 ENCOUNTER — Encounter: Payer: Self-pay | Admitting: Gastroenterology

## 2019-04-19 ENCOUNTER — Telehealth: Payer: Self-pay | Admitting: Gastroenterology

## 2019-04-19 VITALS — BP 120/72 | HR 56 | Temp 97.6°F | Resp 15 | Ht 66.0 in | Wt 180.0 lb

## 2019-04-19 DIAGNOSIS — K621 Rectal polyp: Secondary | ICD-10-CM

## 2019-04-19 DIAGNOSIS — Z1211 Encounter for screening for malignant neoplasm of colon: Secondary | ICD-10-CM | POA: Diagnosis not present

## 2019-04-19 DIAGNOSIS — D128 Benign neoplasm of rectum: Secondary | ICD-10-CM

## 2019-04-19 MED ORDER — FLEET ENEMA 7-19 GM/118ML RE ENEM: 1.0000 | ENEMA | Freq: Once | RECTAL | 0 refills | Status: AC

## 2019-04-19 MED ORDER — SODIUM CHLORIDE 0.9 % IV SOLN: 500.0000 mL | Freq: Once | INTRAVENOUS | Status: DC

## 2019-04-19 NOTE — Op Note (Signed)
[place] Patient Name: Glenda Bauer Procedure Date: 04/19/2019 9:37 AM MRN: [id number] Endoscopist: Mauri Pole , MD Age: 68 Referring MD:  Date of Birth: January 23, 1951 Gender: Female Account #: [id number] Procedure:                Colonoscopy Indications:              Screening for colorectal malignant neoplasm Medicines:                Monitored Anesthesia Care Procedure:                Pre-Anesthesia Assessment:                           - Prior to the procedure, a History and Physical                            was performed, and patient medications and                            allergies were reviewed. The patient's tolerance of                            previous anesthesia was also reviewed. The risks                            and benefits of the procedure and the sedation                            options and risks were discussed with the patient.                            All questions were answered, and informed consent                            was obtained. Prior Anticoagulants: The patient has                            taken no previous anticoagulant or antiplatelet                            agents. ASA Grade Assessment: II - A patient with                            mild systemic disease. After reviewing the risks                            and benefits, the patient was deemed in                            satisfactory condition to undergo the procedure.                           After obtaining informed consent, the colonoscope  was passed under direct vision. Throughout the                            procedure, the patient's blood pressure, pulse, and                            oxygen saturations were monitored continuously. The                            Colonoscope was introduced through the anus and                            advanced to the the cecum, identified by                            appendiceal  orifice and ileocecal valve. The                            colonoscopy was performed without difficulty. The                            patient tolerated the procedure well. The quality                            of the bowel preparation was good. The ileocecal                            valve, appendiceal orifice, and rectum were                            photographed. Scope In: 9:44:19 AM Scope Out: [id number] AM Scope Withdrawal Time: 0 hours 16 minutes 53 seconds  Total Procedure Duration: 0 hours 21 minutes 58 seconds  Findings:                 The perianal and digital rectal examinations were                            normal.                           A 3 mm polyp was found in the rectum. The polyp was                            flat. The polyp was removed with a cold biopsy                            forceps. Resection and retrieval were complete.                           Multiple small-mouthed diverticula were found in                            the sigmoid colon and descending colon.  Non-bleeding internal hemorrhoids were found during                            retroflexion. The hemorrhoids were small. Complications:            No immediate complications. Estimated Blood Loss:     Estimated blood loss was minimal. Impression:               - One 3 mm polyp in the rectum, removed with a cold                            biopsy forceps. Resected and retrieved.                           - Diverticulosis in the sigmoid colon and in the                            descending colon.                           - Non-bleeding internal hemorrhoids. Recommendation:           - Patient has a contact number available for                            emergencies. The signs and symptoms of potential                            delayed complications were discussed with the                            patient. Return to normal activities tomorrow.                             Written discharge instructions were provided to the                            patient.                           - Resume previous diet.                           - Continue present medications.                           - Await pathology results.                           - Repeat colonoscopy in 5-10 years for surveillance                            based on pathology results. Mauri Pole, MD 04/19/2019 10:11:30 AM This report has been signed electronically.

## 2019-04-19 NOTE — Progress Notes (Signed)
Report to PACU, RN, vss, BBS= Clear.  

## 2019-04-19 NOTE — Progress Notes (Signed)
Called to room to assist during endoscopic procedure.  Patient ID and intended procedure confirmed with present staff. Received instructions for my participation in the procedure from the performing physician.  

## 2019-04-19 NOTE — Patient Instructions (Signed)
Please read handouts provided. Continue present medications. Await pathology results.        YOU HAD AN ENDOSCOPIC PROCEDURE TODAY AT THE [facility]:   Refer to the procedure report that was given to you for any specific questions about what was found during the examination.  If the procedure report does not answer your questions, please call your gastroenterologist to clarify.  If you requested that your care partner not be given the details of your procedure findings, then the procedure report has been included in a sealed envelope for you to review at your convenience later.  YOU SHOULD EXPECT: Some feelings of bloating in the abdomen. Passage of more gas than usual.  Walking can help get rid of the air that was put into your GI tract during the procedure and reduce the bloating. If you had a lower endoscopy (such as a colonoscopy or flexible sigmoidoscopy) you may notice spotting of blood in your stool or on the toilet paper. If you underwent a bowel prep for your procedure, you may not have a normal bowel movement for a few days.  Please Note:  You might notice some irritation and congestion in your nose or some drainage.  This is from the oxygen used during your procedure.  There is no need for concern and it should clear up in a day or so.  SYMPTOMS TO REPORT IMMEDIATELY:   Following lower endoscopy (colonoscopy or flexible sigmoidoscopy):  Excessive amounts of blood in the stool  Significant tenderness or worsening of abdominal pains  Swelling of the abdomen that is new, acute  Fever of 100F or higher    For urgent or emergent issues, a gastroenterologist can be reached at any hour by calling [phone].   DIET:  We do recommend a small meal at first, but then you may proceed to your regular diet.  Drink plenty of fluids but you should avoid alcoholic beverages for 24 hours.  ACTIVITY:  You should plan to take it easy for the rest of today and you should NOT  DRIVE or use heavy machinery until tomorrow (because of the sedation medicines used during the test).    FOLLOW UP: Our staff will call the number listed on your records 48-72 hours following your procedure to check on you and address any questions or concerns that you may have regarding the information given to you following your procedure. If we do not reach you, we will leave a message.  We will attempt to reach you two times.  During this call, we will ask if you have developed any symptoms of COVID 19. If you develop any symptoms (ie: fever, flu-like symptoms, shortness of breath, cough etc.) before then, please call [phone].  If you test positive for Covid 19 in the 2 weeks post procedure, please call and report this information to us.    If any biopsies were taken you will be contacted by phone or by letter within the next 1-3 weeks.  Please call us at [phone] if you have not heard about the biopsies in 3 weeks.    SIGNATURES/CONFIDENTIALITY: You and/or your care partner have signed paperwork which will be entered into your electronic medical record.  These signatures attest to the fact that that the information above on your After Visit Summary has been reviewed and is understood.  Full responsibility of the confidentiality of this discharge information lies with you and/or your care-partner. 

## 2019-04-19 NOTE — Telephone Encounter (Signed)
Calling to report she vomited most/all of the second dose of her bowel prep. She had good results with the first dose. Currently bowel movements have some residual and are still moving. Advised to come for colonoscopy as scheduled this morning.

## 2019-04-23 ENCOUNTER — Telehealth: Payer: Self-pay

## 2019-04-23 NOTE — Telephone Encounter (Signed)
  Follow up Call-  Call back number 04/19/2019  Post procedure Call Back phone  # [phone]  Permission to leave phone message Yes  Some recent data might be hidden     Patient questions:  Do you have a fever, pain , or abdominal swelling? No. Pain Score  0 *  Have you tolerated food without any problems? Yes.    Have you been able to return to your normal activities? Yes.    Do you have any questions about your discharge instructions: Diet   No. Medications  No. Follow up visit  No.  Do you have questions or concerns about your Care? No.  Actions: * If pain score is 4 or above: No action needed, pain <4.  1. Have you developed a fever since your procedure? no  2.   Have you had an respiratory symptoms (SOB or cough) since your procedure? no  3.   Have you tested positive for COVID 19 since your procedure no  4.   Have you had any family members/close contacts diagnosed with the COVID 19 since your procedure?  no   If yes to any of these questions please route to Joylene John, RN and Alphonsa Gin, Therapist, sports.

## 2019-04-23 NOTE — Telephone Encounter (Signed)
First attempt follow up call to pt, lm on vm 

## 2019-04-24 ENCOUNTER — Encounter: Payer: Self-pay | Admitting: Gastroenterology

## 2019-04-27 ENCOUNTER — Other Ambulatory Visit: Payer: Self-pay | Admitting: Adult Health

## 2019-04-27 DIAGNOSIS — C84A0 Cutaneous T-cell lymphoma, unspecified, unspecified site: Secondary | ICD-10-CM

## 2019-04-27 NOTE — Telephone Encounter (Signed)
Requested medication (s) are due for refill today: yes  Requested medication (s) are on the active medication list: yes  Last refill:  11/29/2018  Future visit scheduled: no  Notes to clinic:  Review for refill   Requested Prescriptions  Pending Prescriptions Disp Refills   terbinafine (LAMISIL) 250 MG tablet 30 tablet 1    Sig: Take 1 tablet (250 mg total) by mouth daily.     Off-Protocol Failed - 04/27/2019  1:02 PM      Failed - Medication not assigned to a protocol, review manually.      Passed - Valid encounter within last 12 months    Recent Outpatient Visits          2 months ago Routine general medical examination at a health care facility   [facility] [place], [place], NP   7 months ago Moderate persistent asthma with acute exacerbation   [facility], [place], NP   12 months ago Acute sinusitis, recurrence not specified, unspecified location   [place], Delphia Grates, NP   1 year ago Routine general medical examination at a health care facility   [facility] at [place], [place], NP   1 year ago Essential hypertension   Therapist, [facility] at [facility], [place], NP              triamcinolone cream (KENALOG) 0.1 % 453.6 g 1    Sig: Apply 1 application topically 2 (two) times daily.     Dermatology:  Corticosteroids Passed - 04/27/2019  1:02 PM      Passed - Valid encounter within last 12 months    Recent Outpatient Visits          2 months ago Routine general medical examination at a health care facility   [facility] [place], [place], NP   7 months ago Moderate persistent asthma with acute exacerbation   Therapist, [facility] at [facility], [place], NP   12 months ago Acute sinusitis, recurrence not specified, unspecified location   [place], Delphia Grates, NP   1 year ago Routine general medical  examination at a health care facility   [facility] at [place], [place], NP   1 year ago Essential hypertension   Therapist, [facility] at [facility], [place], NP              ALPRAZolam Duanne Moron) 0.25 MG tablet 30 tablet 2     Not Delegated - Psychiatry:  Anxiolytics/Hypnotics Failed - 04/27/2019  1:02 PM      Failed - This refill cannot be delegated      Failed - Urine Drug Screen completed in last 360 days.      Passed - Valid encounter within last 6 months    Recent Outpatient Visits          2 months ago Routine general medical examination at a health care facility   [facility] at [place], [place], NP   7 months ago Moderate persistent asthma with acute exacerbation   Therapist, [facility] at [facility], [place], NP   12 months ago Acute sinusitis, recurrence not specified, unspecified location   [place], Delphia Grates, NP   1 year ago Routine general medical examination at a health care facility   [facility], [place], NP   1 year ago Essential hypertension   Therapist, [facility] at [facility], [place], [place]

## 2019-04-27 NOTE — Telephone Encounter (Signed)
Medication Refill - Medication: triamcinolone cream (KENALOG) 0.1 % terbinafine (LAMISIL) 250 MG tablet ALPRAZolam (XANAX) 0.25 MG tablet  Has the patient contacted their pharmacy? Yes - states they haven't had a response yet (Agent: If no, request that the patient contact the pharmacy for the refill.) (Agent: If yes, when and what did the pharmacy advise?)  Preferred Pharmacy (with phone number or street name):  [facility] [place], [address] AT [address] [phone] (Phone) [phone] (Fax)   Agent: Please be advised that RX refills may take up to 3 business days. We ask that you follow-up with your pharmacy.

## 2019-05-01 ENCOUNTER — Other Ambulatory Visit: Payer: Self-pay | Admitting: Adult Health

## 2019-05-01 DIAGNOSIS — C84A0 Cutaneous T-cell lymphoma, unspecified, unspecified site: Secondary | ICD-10-CM

## 2019-05-01 MED ORDER — TERBINAFINE HCL 250 MG PO TABS: 250.0000 mg | ORAL_TABLET | Freq: Every day | ORAL | 0 refills | Status: DC

## 2019-05-01 MED ORDER — TRIAMCINOLONE ACETONIDE 0.1 % EX CREA: 1.0000 "application " | TOPICAL_CREAM | Freq: Two times a day (BID) | CUTANEOUS | 1 refills | Status: DC

## 2019-05-01 MED ORDER — ALPRAZOLAM 0.25 MG PO TABS: ORAL_TABLET | ORAL | 2 refills | Status: DC

## 2019-06-07 ENCOUNTER — Other Ambulatory Visit: Payer: Self-pay | Admitting: Adult Health

## 2019-06-07 DIAGNOSIS — J4540 Moderate persistent asthma, uncomplicated: Secondary | ICD-10-CM

## 2019-06-11 ENCOUNTER — Ambulatory Visit
Admission: RE | Admit: 2019-06-11 | Discharge: 2019-06-11 | Disposition: A | Discharge status: Home / Self Care | Payer: Medicare Other | Source: Ambulatory Visit | Attending: Adult Health | Admitting: Adult Health

## 2019-06-11 ENCOUNTER — Other Ambulatory Visit: Payer: Self-pay

## 2019-06-11 DIAGNOSIS — Z1231 Encounter for screening mammogram for malignant neoplasm of breast: Secondary | ICD-10-CM

## 2019-06-11 IMAGING — MG DIGITAL SCREENING BILAT W/ TOMO W/ CAD
8 series · 8 of 24 positions shown · non-contrast
Comparison: Previous exam(s).

CLINICAL DATA: Screening.

EXAM:
DIGITAL SCREENING BILATERAL MAMMOGRAM WITH TOMO AND CAD

[L CC synth-2D]
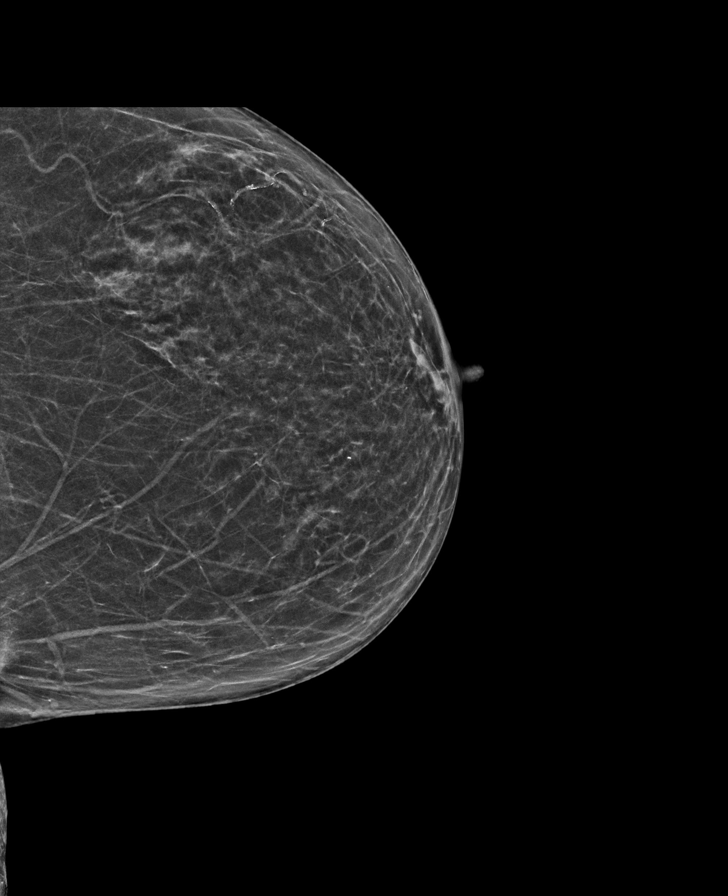

[R MLO synth-2D]
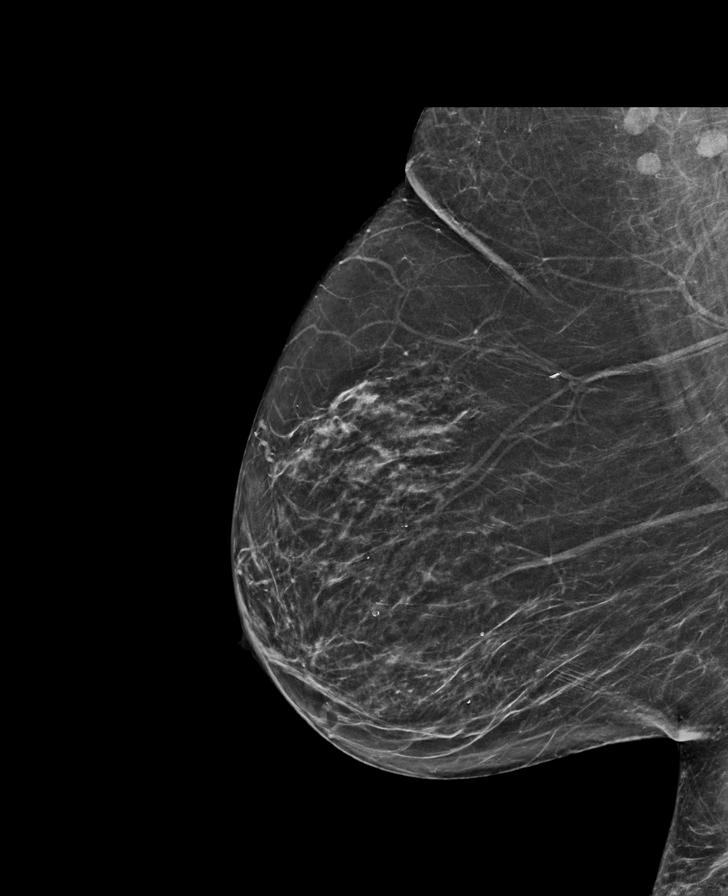

[L MLO synth-2D]
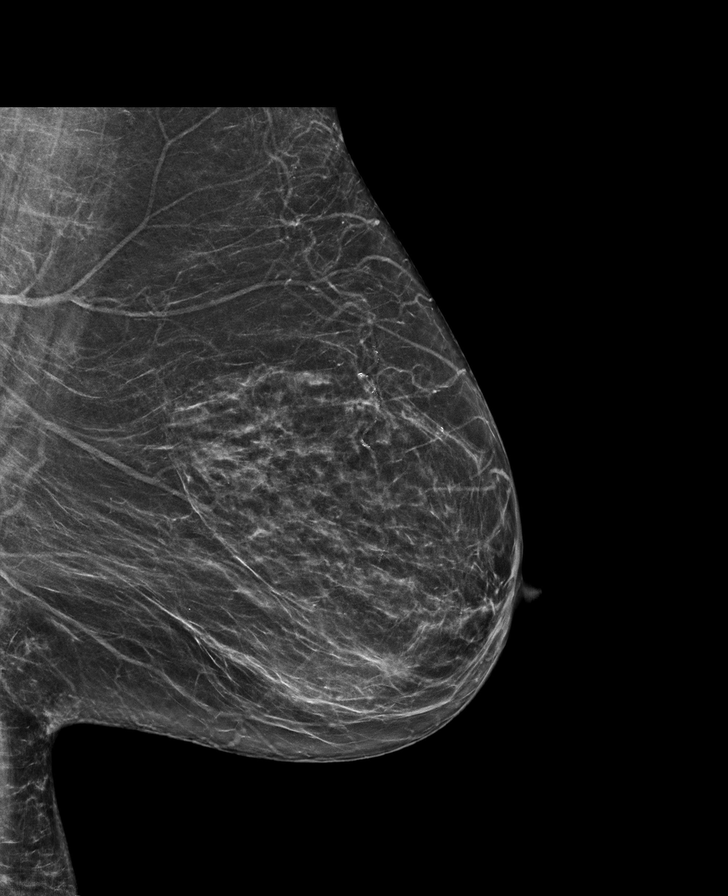

[R CC synth-2D]
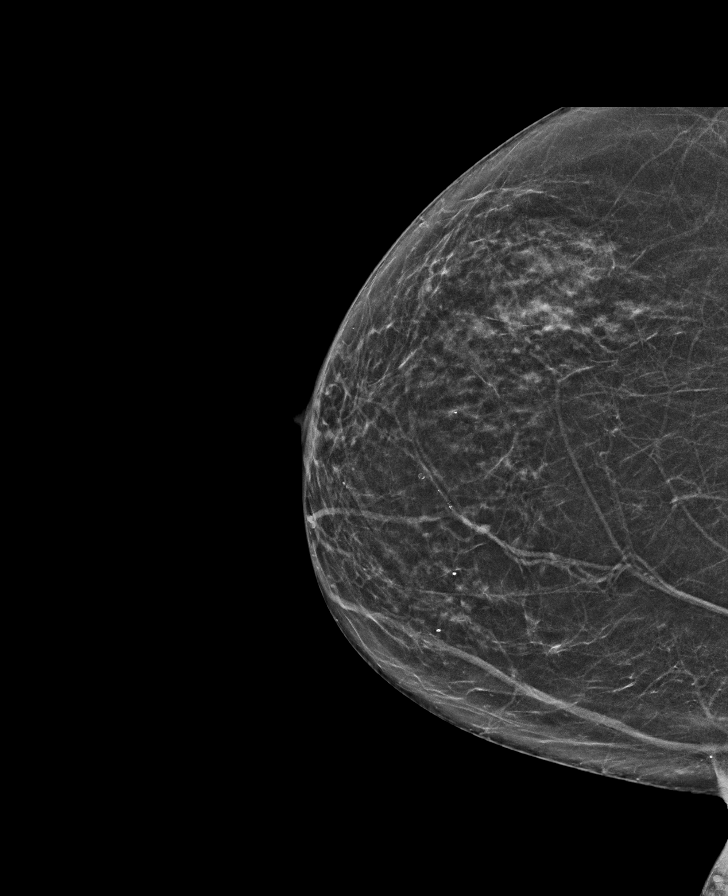

[L CC tomo · tomo slice 29/57.0]
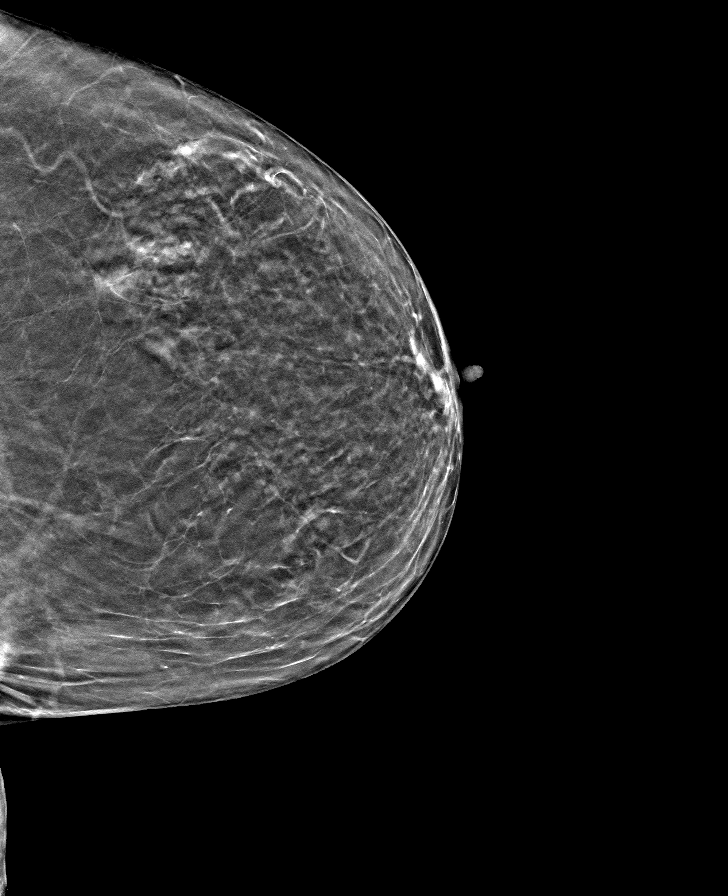

[R CC tomo · tomo slice 28/55.0]
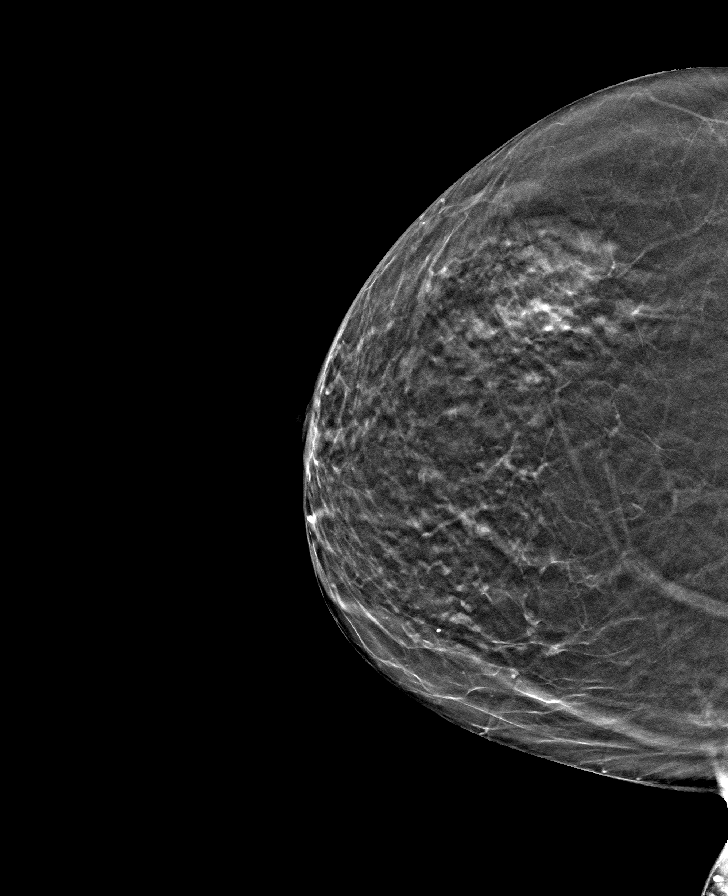

[L MLO tomo · tomo slice 33/65.0]
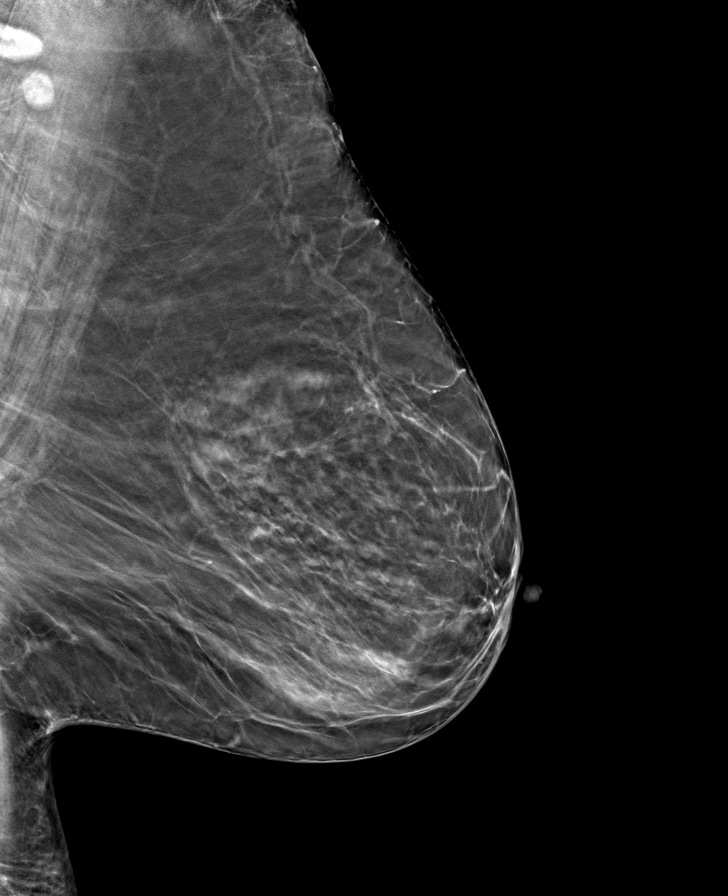

[R MLO tomo · tomo slice 32/63.0]
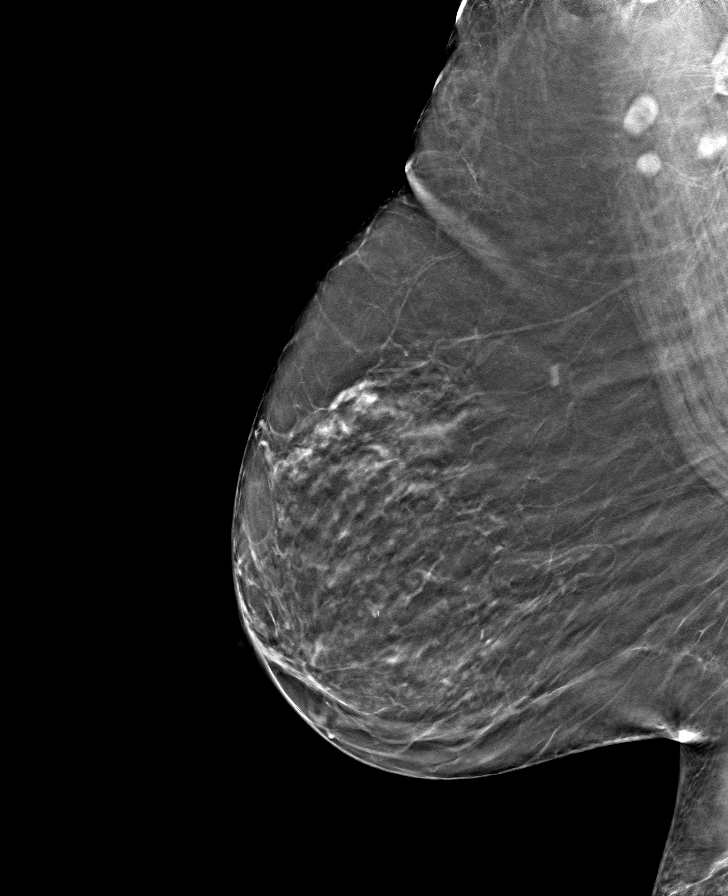

[8 of 24 positions shown; findings below may reference images not displayed]

ACR Breast Density Category b: There are scattered areas of
fibroglandular density.
FINDINGS: There are no findings suspicious for malignancy. Images were
processed with CAD.
IMPRESSION: No mammographic evidence of malignancy. A result letter of this
screening mammogram will be mailed directly to the patient.

RECOMMENDATION:
Screening mammogram in one year. (Code:[TQ])

BI-RADS CATEGORY  1: Negative.

## 2019-06-29 ENCOUNTER — Other Ambulatory Visit: Payer: Self-pay | Admitting: Adult Health

## 2019-06-29 DIAGNOSIS — I10 Essential (primary) hypertension: Secondary | ICD-10-CM

## 2019-08-01 ENCOUNTER — Other Ambulatory Visit: Payer: Self-pay | Admitting: Adult Health

## 2019-08-06 ENCOUNTER — Ambulatory Visit: Payer: Medicare Other | Attending: Internal Medicine

## 2019-08-06 DIAGNOSIS — Z23 Encounter for immunization: Secondary | ICD-10-CM | POA: Insufficient documentation

## 2019-08-06 NOTE — Progress Notes (Signed)
   Covid-19 Vaccination Clinic  Name:  [name]    MRN: [id number] DOB: [birth year]  08/06/2019  [name] was observed post Covid-19 immunization for 15 minutes without incidence. She was provided with Vaccine Information Sheet and instruction to access the V-Safe system.   [name] was instructed to call 911 with any severe reactions post vaccine: Marland Kitchen Difficulty breathing  . Swelling of your face and throat  . A fast heartbeat  . A bad rash all over your body  . Dizziness and weakness    Immunizations Administered    Name Date Dose VIS Date Route   Pfizer COVID-19 Vaccine 08/06/2019 10:00 AM 0.3 mL 05/18/2019 Intramuscular   Manufacturer: [place]   Lot: [id number]   [place]: [id number]

## 2019-09-04 ENCOUNTER — Ambulatory Visit: Payer: Medicare Other | Attending: Internal Medicine

## 2019-09-04 DIAGNOSIS — Z23 Encounter for immunization: Secondary | ICD-10-CM

## 2019-09-04 NOTE — Progress Notes (Signed)
   Covid-19 Vaccination Clinic  Name:  Glenda Bauer    MRN: [id number] DOB: 30-Jul-1950  09/04/2019  [name] was observed post Covid-19 immunization for 15 minutes without incident. She was provided with Vaccine Information Sheet and instruction to access the V-Safe system.   [name] was instructed to call 911 with any severe reactions post vaccine: Marland Kitchen Difficulty breathing  . Swelling of face and throat  . A fast heartbeat  . A bad rash all over body  . Dizziness and weakness   Immunizations Administered    Name Date Dose VIS Date Route   Pfizer COVID-19 Vaccine 09/04/2019 10:54 AM 0.3 mL 05/18/2019 Intramuscular   Manufacturer: [place]   Lot: [id number]   [place]: [id number]

## 2019-09-13 ENCOUNTER — Other Ambulatory Visit: Payer: Self-pay | Admitting: Adult Health

## 2019-09-13 NOTE — Telephone Encounter (Signed)
SENT TO THE PHARMACY BY E-SCRIBE. 

## 2019-09-14 ENCOUNTER — Other Ambulatory Visit: Payer: Self-pay | Admitting: Adult Health

## 2019-09-14 DIAGNOSIS — J4541 Moderate persistent asthma with (acute) exacerbation: Secondary | ICD-10-CM

## 2019-09-14 NOTE — Telephone Encounter (Signed)
SENT TO THE PHARMACY BY E-SCRIBE. 

## 2019-09-23 ENCOUNTER — Other Ambulatory Visit: Payer: Self-pay | Admitting: Adult Health

## 2019-12-01 ENCOUNTER — Other Ambulatory Visit: Payer: Self-pay | Admitting: Adult Health

## 2019-12-13 ENCOUNTER — Other Ambulatory Visit: Payer: Self-pay | Admitting: Adult Health

## 2019-12-13 DIAGNOSIS — I10 Essential (primary) hypertension: Secondary | ICD-10-CM

## 2019-12-13 NOTE — Telephone Encounter (Signed)
Sent to the pharmacy by e-scribe. 

## 2020-01-30 ENCOUNTER — Telehealth: Payer: Self-pay | Admitting: Adult Health

## 2020-01-30 ENCOUNTER — Other Ambulatory Visit: Payer: Self-pay | Admitting: *Deleted

## 2020-01-30 DIAGNOSIS — C84A0 Cutaneous T-cell lymphoma, unspecified, unspecified site: Secondary | ICD-10-CM

## 2020-01-30 NOTE — Telephone Encounter (Signed)
Referral placed and patient notified.  

## 2020-01-30 NOTE — Telephone Encounter (Signed)
Ok for referral?

## 2020-01-30 NOTE — Telephone Encounter (Signed)
Pt stated she is way over due for her skin test and needs a referral sent to a dermatologist   [facility] [address] Telephone: [phone]   Pt can be reached at [phone]

## 2020-01-30 NOTE — Telephone Encounter (Signed)
Is it okay to place referral? 

## 2020-02-26 ENCOUNTER — Other Ambulatory Visit: Payer: Self-pay | Admitting: Adult Health

## 2020-02-27 ENCOUNTER — Other Ambulatory Visit: Payer: Self-pay | Admitting: Adult Health

## 2020-02-27 DIAGNOSIS — I10 Essential (primary) hypertension: Secondary | ICD-10-CM

## 2020-03-24 DIAGNOSIS — L821 Other seborrheic keratosis: Secondary | ICD-10-CM | POA: Diagnosis not present

## 2020-03-24 DIAGNOSIS — D225 Melanocytic nevi of trunk: Secondary | ICD-10-CM | POA: Diagnosis not present

## 2020-03-24 DIAGNOSIS — L905 Scar conditions and fibrosis of skin: Secondary | ICD-10-CM | POA: Diagnosis not present

## 2020-03-24 DIAGNOSIS — D1801 Hemangioma of skin and subcutaneous tissue: Secondary | ICD-10-CM | POA: Diagnosis not present

## 2020-03-24 DIAGNOSIS — L814 Other melanin hyperpigmentation: Secondary | ICD-10-CM | POA: Diagnosis not present

## 2020-03-26 ENCOUNTER — Other Ambulatory Visit: Payer: Self-pay | Admitting: Adult Health

## 2020-03-26 DIAGNOSIS — J4541 Moderate persistent asthma with (acute) exacerbation: Secondary | ICD-10-CM

## 2020-03-27 ENCOUNTER — Encounter: Payer: Self-pay | Admitting: Adult Health

## 2020-03-27 ENCOUNTER — Other Ambulatory Visit: Payer: Self-pay

## 2020-03-27 ENCOUNTER — Ambulatory Visit (INDEPENDENT_AMBULATORY_CARE_PROVIDER_SITE_OTHER): Payer: Medicare Other | Admitting: Adult Health

## 2020-03-27 VITALS — BP 100/64 | HR 48 | Temp 97.5°F | Ht 66.25 in | Wt 177.0 lb

## 2020-03-27 DIAGNOSIS — F419 Anxiety disorder, unspecified: Secondary | ICD-10-CM

## 2020-03-27 DIAGNOSIS — I10 Essential (primary) hypertension: Secondary | ICD-10-CM

## 2020-03-27 DIAGNOSIS — J4540 Moderate persistent asthma, uncomplicated: Secondary | ICD-10-CM

## 2020-03-27 DIAGNOSIS — Z0000 Encounter for general adult medical examination without abnormal findings: Secondary | ICD-10-CM | POA: Diagnosis not present

## 2020-03-27 DIAGNOSIS — Z23 Encounter for immunization: Secondary | ICD-10-CM

## 2020-03-27 DIAGNOSIS — I4891 Unspecified atrial fibrillation: Secondary | ICD-10-CM | POA: Diagnosis not present

## 2020-03-27 DIAGNOSIS — F32A Depression, unspecified: Secondary | ICD-10-CM

## 2020-03-27 MED ORDER — METOPROLOL SUCCINATE ER 50 MG PO TB24: 50.0000 mg | ORAL_TABLET | Freq: Every day | ORAL | 3 refills | Status: DC

## 2020-03-27 MED ORDER — RIVAROXABAN 20 MG PO TABS: 20.0000 mg | ORAL_TABLET | Freq: Every day | ORAL | 1 refills | Status: DC

## 2020-03-27 MED ORDER — BREO ELLIPTA 200-25 MCG/INH IN AEPB: INHALATION_SPRAY | RESPIRATORY_TRACT | 3 refills | Status: DC

## 2020-03-27 NOTE — Progress Notes (Signed)
Subjective:    Patient ID: Glenda Bauer, female    DOB: 06-28-1950, 69 y.o.   MRN: [id number]  HPI  Patient presents for yearly preventative medicine examination. 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  Essential Hypertension -currently maintained on lisinopril/hydrochlorothiazide 20-12.5 mg.  Blood pressures have been stable in the past.  She denies lightheadedness dizziness, headaches, blurred vision, or chest pain  BP Readings from Last 3 Encounters:  03/27/20 100/64  04/19/19 120/72  02/06/19 120/74    Asthma-is well controlled on Breo Ellipta, butyryl rescue inhaler as needed and  Singulair daily She denies shortness of breath past her baseline.  Anxiety/depression-is currently prescribed Prozac $RemoveBeforeDEI'20mg'[id number]$  and Xanax 0.25 mg as needed.  She does feel well controlled on this medication.  Fibromyalgia-takes an SSRI  Cutaneous T Cell Lymphoma - has been referred to [facility] to their Cutaneous T cell lymphoma research clinic.   Palpitations -she reports that over the last few months she has been feeling more palpitations than she has in the past.  She can feel these palpitations more so when she is laying on her left side in bed.  Palpitations do not last very long.  She denies chest pain or shortness of breath but has had some near syncopal episodes from time to time.   All immunizations and health maintenance protocols were reviewed with the patient and needed orders were placed. Due for flu shot   Appropriate screening laboratory values were ordered for the patient including screening of hyperlipidemia, renal function and hepatic function.  Medication reconciliation,  past medical history, social  history, problem list and allergies were reviewed in detail with the patient  Goals were established with regard to weight loss, exercise, and  diet in compliance with medications. She walks about a mile a half and is eating healthy.  Wt Readings from Last 3 Encounters:  03/27/20 177 lb (80.3 kg)  04/19/19 180 lb (81.6 kg)  04/03/19 180 lb (81.6 kg)   And she is up-to-date on routine colon cancer screening  Review of Systems  Constitutional: Negative.   HENT: Negative.   Eyes: Negative.   Respiratory: Negative.   Cardiovascular: Negative.   Gastrointestinal: Negative.   Endocrine: Negative.   Genitourinary: Negative.   Musculoskeletal: Negative.   Skin: Negative.   Allergic/Immunologic: Negative.   Neurological: Negative.   Hematological: Negative.   Psychiatric/Behavioral: Negative.    Past Medical History:  Diagnosis Date  . Allergy   . Anxiety   . Asthma   . Cancer (HCC)    Melanoma and Mycosis Fungosis  . Cataract   . Cutaneous T-cell lymphoma [facility]) 2011   Dermatologist treated in [place]; Dr. Hipolito Bayley  . Depression   . GERD (gastroesophageal reflux disease)   . Heart murmur   . Hypertension   . Neuromuscular disorder ([place])   . Osteoarthritis   . Thyroid nodule   . Urinary tract infection     Social History   Socioeconomic History  . Marital status: Legally Separated    Spouse name: Not on file  . Number of children: 3  . Years of education: 27  . Highest education level: Not on file  Occupational History  . Occupation: Medical sales representative   Tobacco Use  . Smoking status: Former Smoker  Packs/day: 0.25    Years: 10.00    Pack years: 2.50    Types: Cigarettes  . Smokeless tobacco: Never Used  . Tobacco comment: 35 years ago; smoke socially  Vaping Use  . Vaping Use: Never used  Substance and Sexual Activity  . Alcohol use: No    Alcohol/week: 0.0 standard drinks  . Drug use: No  . Sexual activity: Not on file  Other Topics Concern  . Not on file    Social History Narrative   Separated    Three children ( one lives locally)    Social Determinants of Health   Financial Resource Strain:   . Difficulty of Paying Living Expenses: Not on file  Food Insecurity:   . Worried About Charity fundraiser in the Last Year: Not on file  . Ran Out of Food in the Last Year: Not on file  Transportation Needs:   . Lack of Transportation (Medical): Not on file  . Lack of Transportation (Non-Medical): Not on file  Physical Activity:   . Days of Exercise per Week: Not on file  . Minutes of Exercise per Session: Not on file  Stress:   . Feeling of Stress : Not on file  Social Connections:   . Frequency of Communication with Friends and Family: Not on file  . Frequency of Social Gatherings with Friends and Family: Not on file  . Attends Religious Services: Not on file  . Active Member of Clubs or Organizations: Not on file  . Attends Archivist Meetings: Not on file  . Marital Status: Not on file  Intimate Partner Violence:   . Fear of Current or Ex-Partner: Not on file  . Emotionally Abused: Not on file  . Physically Abused: Not on file  . Sexually Abused: Not on file    Past Surgical History:  Procedure Laterality Date  . Cataracts Bilateral   . CHOLECYSTECTOMY  2015  . melenoma removal  1979  . Sinus Polyps       Family History  Problem Relation Age of Onset  . Arthritis Mother   . Ovarian cancer Mother   . Breast cancer Mother   . Parkinson's disease Father   . Crohn's disease Brother   . Schizophrenia Brother   . Colon cancer Neg Hx   . Esophageal cancer Neg Hx   . Rectal cancer Neg Hx   . Stomach cancer Neg Hx     No Known Allergies  Current Outpatient Medications on File Prior to Visit  Medication Sig Dispense Refill  . albuterol (PROVENTIL) (2.5 MG/3ML) 0.083% nebulizer solution USE 1 VIAL VIA NEBULIZER EVERY 6 HOURS AS NEEDED FOR WHEEZING OR SHORTNESS OF BREATH 225 mL 0  . ALPRAZolam (XANAX) 0.25 MG  tablet TAKE 1 TO 2 TABLETS BY MOUTH DAILY AS NEEDED 30 tablet 2  . BREO ELLIPTA 200-25 MCG/INH AEPB INHALE 1 PUFF INTO THE LUNGS DAILY 180 each 1  . famotidine (PEPCID) 20 MG tablet Take 20 mg by mouth daily.    Marland Kitchen FLUoxetine (PROZAC) 20 MG capsule TAKE 1 CAPSULE(20 MG) BY MOUTH DAILY 90 capsule 0  . fluticasone (FLONASE) 50 MCG/ACT nasal spray Place 2 sprays into both nostrils daily.    Marland Kitchen levocetirizine (XYZAL) 5 MG tablet Take 5 mg by mouth every evening.    Marland Kitchen lisinopril-hydrochlorothiazide (ZESTORETIC) 20-12.5 MG tablet TAKE 1 TABLET BY MOUTH DAILY 90 tablet 0  . montelukast (SINGULAIR) 10 MG tablet Take 1 tablet (10 mg total) by mouth  at bedtime. 90 tablet 3  . Multiple Vitamin (MULTIVITAMIN) tablet Take 1 tablet by mouth daily.    . Multiple Vitamins-Minerals (VISION FORMULA PO) Take by mouth.    Marland Kitchen POTASSIUM PO Take by mouth.    Marland Kitchen PROAIR RESPICLICK 811 (90 Base) MCG/ACT AEPB INHALE 2 PUFFS INTO THE LUNGS EVERY 6 HOURS AS NEEDED 1 each 2  . Probiotic Product (ALIGN) 4 MG CAPS Take by mouth daily.    Marland Kitchen terbinafine (LAMISIL) 250 MG tablet Take 1 tablet (250 mg total) by mouth daily. 30 tablet 0  . triamcinolone cream (KENALOG) 0.1 % Apply 1 application topically 2 (two) times daily. 453.6 g 1   No current facility-administered medications on file prior to visit.    BP 100/64 (BP Location: Left Arm, Patient Position: Sitting, Cuff Size: Normal)   Pulse (!) 48   Temp (!) 97.5 F (36.4 C) (Oral)   Ht 5' 6.25" (1.683 m)   Wt 177 lb (80.3 kg)   SpO2 98%   BMI 28.35 kg/m       Objective:   Physical Exam Vitals and nursing note reviewed.  Constitutional:      General: She is not in acute distress.    Appearance: Normal appearance. She is well-developed. She is not ill-appearing.  HENT:     Head: Normocephalic and atraumatic.     Right Ear: Tympanic membrane, ear canal and external ear normal. There is no impacted cerumen.     Left Ear: Tympanic membrane, ear canal and external ear  normal. There is no impacted cerumen.     Nose: Nose normal. No congestion or rhinorrhea.     Mouth/Throat:     Mouth: Mucous membranes are moist.     Pharynx: Oropharynx is clear. No oropharyngeal exudate or posterior oropharyngeal erythema.  Eyes:     General:        Right eye: No discharge.        Left eye: No discharge.     Extraocular Movements: Extraocular movements intact.     Conjunctiva/sclera: Conjunctivae normal.     Pupils: Pupils are equal, round, and reactive to light.  Neck:     Thyroid: No thyromegaly.     Vascular: No carotid bruit.     Trachea: No tracheal deviation.  Cardiovascular:     Rate and Rhythm: Normal rate. Rhythm irregularly irregular.     Pulses: Normal pulses.     Heart sounds: Normal heart sounds. No murmur heard.  No friction rub. No gallop.   Pulmonary:     Effort: Pulmonary effort is normal. No respiratory distress.     Breath sounds: Normal breath sounds. No stridor. No wheezing, rhonchi or rales.  Chest:     Chest wall: No tenderness.  Abdominal:     General: Abdomen is flat. Bowel sounds are normal. There is no distension.     Palpations: Abdomen is soft. There is no mass.     Tenderness: There is no abdominal tenderness. There is no right CVA tenderness, left CVA tenderness, guarding or rebound.     Hernia: No hernia is present.  Musculoskeletal:        General: No swelling, tenderness, deformity or signs of injury. Normal range of motion.     Cervical back: Normal range of motion and neck supple.     Right lower leg: No edema.     Left lower leg: No edema.  Lymphadenopathy:     Cervical: No cervical adenopathy.  Skin:  General: Skin is warm and dry.     Coloration: Skin is not jaundiced or pale.     Findings: No bruising, erythema, lesion or rash.  Neurological:     General: No focal deficit present.     Mental Status: She is alert and oriented to person, place, and time.     Cranial Nerves: No cranial nerve deficit.     Sensory:  No sensory deficit.     Motor: No weakness.     Coordination: Coordination normal.     Gait: Gait normal.     Deep Tendon Reflexes: Reflexes normal.  Psychiatric:        Mood and Affect: Mood normal.        Behavior: Behavior normal.        Thought Content: Thought content normal.        Judgment: Judgment normal.       Assessment & Plan:  1. Routine general medical examination at a health care facility - CBC with Differential/Platelet; Future - Lipid panel; Future - TSH; Future - CMP with eGFR([facility]); Future - metoprolol succinate (TOPROL-XL) 50 MG 24 hr tablet; Take 1 tablet (50 mg total) by mouth daily. Take with or immediately following a meal.  Dispense: 90 tablet; Refill: 3 - rivaroxaban (XARELTO) 20 MG TABS tablet; Take 1 tablet (20 mg total) by mouth daily with supper.  Dispense: 30 tablet; Refill: 1 - Amb Referral to AFIB Clinic - CMP with eGFR([facility]) - TSH - Lipid panel - CBC with Differential/Platelet  2. Essential hypertension -We will have her stop lisinopril/hydrochlorothiazide for the time being as her blood pressure was low in the office today and I am starting her on Toprol to help with rate control from newly diagnosed A. fib.  We will have her watch her blood pressure at home with return precautions given - CBC with Differential/Platelet; Future - Lipid panel; Future - TSH; Future - CMP with eGFR([facility]); Future - metoprolol succinate (TOPROL-XL) 50 MG 24 hr tablet; Take 1 tablet (50 mg total) by mouth daily. Take with or immediately following a meal.  Dispense: 90 tablet; Refill: 3  - Amb Referral to AFIB Clinic - CMP with eGFR([facility]) - TSH - Lipid panel - CBC with Differential/Platelet  3. Moderate persistent chronic asthma without complication  - fluticasone furoate-vilanterol (BREO ELLIPTA) 200-25 MCG/INH AEPB; INHALE 1 PUFF INTO THE LUNGS DAILY  Dispense: 180 each; Refill: 3 - metoprolol succinate (TOPROL-XL) 50 MG 24 hr tablet; Take 1 tablet (50 mg  total) by mouth daily. Take with or immediately following a meal.  Dispense: 90 tablet; Refill: 3   4. Anxiety and depression -Continue with SSRI and Xanax as needed  5. Moderate persistent asthma without complication  - fluticasone furoate-vilanterol (BREO ELLIPTA) 200-25 MCG/INH AEPB; INHALE 1 PUFF INTO THE LUNGS DAILY  Dispense: 180 each; Refill: 3  6. Need for immunization against influenza - Flu Vaccine QUAD High Dose(Fluad)  7. Atrial fibrillation, unspecified type ([place]) -Newly diagnosed. - EKG 12-Lead-atrial fibrillation/atrial flutter with a rate of 137.  We will start her on Toprol and Xarelto 20 mg tablets.  She was given a sample in the office today and took her for first dose.  Was referred to A. fib clinic - metoprolol succinate (TOPROL-XL) 50 MG 24 hr tablet; Take 1 tablet (50 mg total) by mouth daily. Take with or immediately following a meal.  Dispense: 90 tablet; Refill: 3 - rivaroxaban (XARELTO) 20 MG TABS tablet; Take 1 tablet (20 mg total)  by mouth daily with supper.  Dispense: 30 tablet; Refill: 1 - Amb Referral to AFIB Clinic - CMP with eGFR([facility]) - TSH - Lipid panel - CBC with Differential/Platelet  [name], NP

## 2020-03-28 LAB — CBC WITH DIFFERENTIAL/PLATELET
Absolute Monocytes: 482 cells/uL (ref 200–950)
Basophils Absolute: 58 cells/uL (ref 0–200)
Basophils Relative: 1.1 %
Eosinophils Absolute: 244 cells/uL (ref 15–500)
Eosinophils Relative: 4.6 %
HCT: 48.3 % — ABNORMAL HIGH (ref 35.0–45.0)
Hemoglobin: 16.7 g/dL — ABNORMAL HIGH (ref 11.7–15.5)
Lymphs Abs: 1659 cells/uL (ref 850–3900)
MCH: 30.8 pg (ref 27.0–33.0)
MCHC: 34.6 g/dL (ref 32.0–36.0)
MCV: 89 fL (ref 80.0–100.0)
MPV: 10.3 fL (ref 7.5–12.5)
Monocytes Relative: 9.1 %
Neutro Abs: 2857 cells/uL (ref 1500–7800)
Neutrophils Relative %: 53.9 %
Platelets: 243 10*3/uL (ref 140–400)
RBC: 5.43 10*6/uL — ABNORMAL HIGH (ref 3.80–5.10)
RDW: 12.6 % (ref 11.0–15.0)
Total Lymphocyte: 31.3 %
WBC: 5.3 10*3/uL (ref 3.8–10.8)

## 2020-03-28 LAB — COMPLETE METABOLIC PANEL WITH GFR
AG Ratio: 1.8 (calc) (ref 1.0–2.5)
ALT: 20 U/L (ref 6–29)
AST: 18 U/L (ref 10–35)
Albumin: 4.2 g/dL (ref 3.6–5.1)
Alkaline phosphatase (APISO): 49 U/L (ref 37–153)
BUN: 22 mg/dL (ref 7–25)
CO2: 25 mmol/L (ref 20–32)
Calcium: 9.2 mg/dL (ref 8.6–10.4)
Chloride: 108 mmol/L (ref 98–110)
Creat: 0.89 mg/dL (ref 0.60–0.93)
GFR, Est African American: 76 mL/min/{1.73_m2} (ref >=60)
GFR, Est Non African American: 65 mL/min/{1.73_m2} (ref >=60)
Globulin: 2.4 g/dL (calc) (ref 1.9–3.7)
Glucose, Bld: 109 mg/dL — ABNORMAL HIGH (ref 65–99)
Potassium: 3.8 mmol/L (ref 3.5–5.3)
Sodium: 140 mmol/L (ref 135–146)
Total Bilirubin: 0.7 mg/dL (ref 0.2–1.2)
Total Protein: 6.6 g/dL (ref 6.1–8.1)

## 2020-03-28 LAB — LIPID PANEL
Cholesterol: 208 mg/dL — ABNORMAL HIGH (ref <200)
HDL: 77 mg/dL (ref >=50)
LDL Cholesterol (Calc): 110 mg/dL (calc) — ABNORMAL HIGH
Non-HDL Cholesterol (Calc): 131 mg/dL (calc) — ABNORMAL HIGH (ref <130)
Total CHOL/HDL Ratio: 2.7 (calc) (ref <5.0)
Triglycerides: 100 mg/dL (ref <150)

## 2020-03-28 LAB — TSH: TSH: 1.26 mIU/L (ref 0.40–4.50)

## 2020-04-02 MED ORDER — SIMVASTATIN 10 MG PO TABS: 10.0000 mg | ORAL_TABLET | Freq: Every day | ORAL | 1 refills | Status: DC

## 2020-04-02 NOTE — Addendum Note (Signed)
Addended by: Agnes Lawrence on: 04/02/2020 02:15 PM   Modules accepted: Orders

## 2020-04-02 NOTE — Progress Notes (Signed)
Primary Care Physician: [name], NP Primary Cardiologist: none Primary Electrophysiologist: none Referring Physician: [name]   Glenda Bauer is a 69 y.o. female with a history of HTN, cutaneous T-cell lymphoma, and atrial fibrillation who presents for consultation in the [facility].  The patient was initially diagnosed with atrial fibrillation 03/27/20 at her routine PCP visit. She does admit that she had been having more frequent palpitations for a few months prior but these symptoms were brief. ECG showed afib HR 137. Patient was started on Xarelto for a CHADS2VASC score of 3 and metoprolol for rate control. She denies any significant snoring or alcohol use. Since then, she has not had any palpitations but has felt more fatigued. She has also noted her BP is steadily increasing.   Today, she denies symptoms of palpitations, chest pain, shortness of breath, orthopnea, PND, lower extremity edema, dizziness, presyncope, syncope, snoring, daytime somnolence, bleeding, or neurologic sequela. The patient is tolerating medications without difficulties and is otherwise without complaint today.    Atrial Fibrillation Risk Factors:  she does not have symptoms or diagnosis of sleep apnea. she does not have a history of rheumatic fever. she does not have a history of alcohol use. The patient does not have a history of early familial atrial fibrillation or other arrhythmias.  she has a BMI of Body mass index is 29.12 kg/m.Marland Kitchen Filed Weights   04/03/20 0937  Weight: 82.5 kg    Family History  Problem Relation Age of Onset   Arthritis Mother    Ovarian cancer Mother    Breast cancer Mother    Parkinson's disease Father    Crohn's disease Brother    Schizophrenia Brother    Colon cancer Neg Hx    Esophageal cancer Neg Hx    Rectal cancer Neg Hx    Stomach cancer Neg Hx      Atrial Fibrillation Management history:  Previous  antiarrhythmic drugs: none Previous cardioversions: none Previous ablations: none CHADS2VASC score: 3 Anticoagulation history: Xarelto    Past Medical History:  Diagnosis Date   Allergy    Anxiety    Asthma    Cancer ([place])    Melanoma and Mycosis Fungosis   Cataract    Cutaneous T-cell lymphoma ([place]) 2011   Dermatologist treated in [place]; Dr. Hipolito Bayley   Depression    GERD (gastroesophageal reflux disease)    Heart murmur    Hypertension    Neuromuscular disorder (HCC)    Osteoarthritis    Thyroid nodule    Urinary tract infection    Past Surgical History:  Procedure Laterality Date   Cataracts Bilateral    CHOLECYSTECTOMY  2015   melenoma removal  1979   Sinus Polyps       Current Outpatient Medications  Medication Sig Dispense Refill   albuterol (PROVENTIL) (2.5 MG/3ML) 0.083% nebulizer solution USE 1 VIAL VIA NEBULIZER EVERY 6 HOURS AS NEEDED FOR WHEEZING OR SHORTNESS OF BREATH 225 mL 0   ALPRAZolam (XANAX) 0.25 MG tablet TAKE 1 TO 2 TABLETS BY MOUTH DAILY AS NEEDED 30 tablet 2   famotidine (PEPCID) 20 MG tablet Take 20 mg by mouth daily.     FLUoxetine (PROZAC) 20 MG capsule TAKE 1 CAPSULE(20 MG) BY MOUTH DAILY 90 capsule 0   fluticasone (FLONASE) 50 MCG/ACT nasal spray Place 2 sprays into both nostrils daily.     fluticasone furoate-vilanterol (BREO ELLIPTA) 200-25 MCG/INH AEPB INHALE 1 PUFF INTO THE LUNGS DAILY 180 each  3   levocetirizine (XYZAL) 5 MG tablet Take 5 mg by mouth every evening.     metoprolol succinate (TOPROL-XL) 50 MG 24 hr tablet Take 0.5 tablets (25 mg total) by mouth daily. Take with or immediately following a meal. 90 tablet 3   montelukast (SINGULAIR) 10 MG tablet Take 1 tablet (10 mg total) by mouth at bedtime. 90 tablet 3   Multiple Vitamin (MULTIVITAMIN) tablet Take 1 tablet by mouth daily.     Multiple Vitamins-Minerals (VISION FORMULA PO) Take by mouth daily.      PROAIR RESPICLICK 361 (90 Base) MCG/ACT  AEPB INHALE 2 PUFFS INTO THE LUNGS EVERY 6 HOURS AS NEEDED 1 each 2   Probiotic Product (ALIGN) 4 MG CAPS Take by mouth daily.     rivaroxaban (XARELTO) 20 MG TABS tablet Take 1 tablet (20 mg total) by mouth daily with supper. 30 tablet 1   simvastatin (ZOCOR) 10 MG tablet Take 1 tablet (10 mg total) by mouth at bedtime. 90 tablet 1   triamcinolone cream (KENALOG) 0.1 % Apply 1 application topically 2 (two) times daily. 453.6 g 1   lisinopril (ZESTRIL) 20 MG tablet Take 1 tablet (20 mg total) by mouth daily. 30 tablet 3   POTASSIUM PO Take by mouth. (Patient not taking: Reported on 04/03/2020)     No current facility-administered medications for this encounter.    No Known Allergies  Social History   Socioeconomic History   Marital status: Divorced    Spouse name: Not on file   Number of children: 3   Years of education: 16   Highest education level: Not on file  Occupational History   Occupation: Medical sales representative   Tobacco Use   Smoking status: Former Smoker    Packs/day: 0.25    Years: 10.00    Pack years: 2.50    Types: Cigarettes   Smokeless tobacco: Never Used   Tobacco comment: 35 years ago; smoke socially  Scientific laboratory technician Use: Never used  Substance and Sexual Activity   Alcohol use: No    Alcohol/week: 0.0 standard drinks   Drug use: No   Sexual activity: Not on file  Other Topics Concern   Not on file  Social History Narrative   Separated    Three children ( one lives locally)    Social Determinants of Health   Financial Resource Strain:    Difficulty of Paying Living Expenses: Not on file  Food Insecurity:    Worried About Charity fundraiser in the Last Year: Not on file   [employer] of Food in the Last Year: Not on file  Transportation Needs:    Lack of Transportation (Medical): Not on file   Lack of Transportation (Non-Medical): Not on file  Physical Activity:    Days of Exercise per Week: Not on file   Minutes of Exercise per  Session: Not on file  Stress:    Feeling of Stress : Not on file  Social Connections:    Frequency of Communication with Friends and Family: Not on file   Frequency of Social Gatherings with Friends and Family: Not on file   Attends Religious Services: Not on file   Active Member of Clubs or Organizations: Not on file   Attends Archivist Meetings: Not on file   Marital Status: Not on file  Intimate Partner Violence:    Fear of Current or Ex-Partner: Not on file   Emotionally Abused: Not on file  Physically Abused: Not on file   Sexually Abused: Not on file     ROS- All systems are reviewed and negative except as per the HPI above.  Physical Exam: Vitals:   04/03/20 0937  BP: (!) 174/76  Pulse: (!) 50  Weight: 82.5 kg  Height: 5' 6.25" (1.683 m)    GEN- The patient is well appearing, alert and oriented x 3 today.   Head- normocephalic, atraumatic Eyes-  Sclera clear, conjunctiva pink Ears- hearing intact Oropharynx- clear Neck- supple  Lungs- Clear to ausculation bilaterally, normal work of breathing Heart- Regular rate and rhythm, bradycardia, no murmurs, rubs or gallops  GI- soft, NT, ND, + BS Extremities- no clubbing, cyanosis, or edema MS- no significant deformity or atrophy Skin- no rash or lesion Psych- euthymic mood, full affect Neuro- strength and sensation are intact  Wt Readings from Last 3 Encounters:  04/03/20 82.5 kg  03/27/20 80.3 kg  04/19/19 81.6 kg    EKG today demonstrates SB HR 50, PR 152, QRS 82, QTc 444  Epic records are reviewed at length today  CHA2DS2-VASc Score = 3  The patient's score is based upon: CHF History: 0 HTN History: 1 Diabetes History: 0 Stroke History: 0 Vascular Disease History: 0 Age Score: 1 Gender Score: 1      ASSESSMENT AND PLAN: 1. Paroxysmal Atrial Fibrillation (ICD10:  I48.0) The patient's CHA2DS2-VASc score is 3, indicating a 3.2% annual risk of stroke.   General education about  afib provided and questions answered. We also discussed her stroke risk and the risks and benefits of anticoagulation. Will decrease Toprol to 25 mg daily given bradycardia. Check echocardiogram Continue Xarelto 20 mg daily  2. Secondary Hypercoagulable State (ICD10:  D68.69) The patient is at significant risk for stroke/thromboembolism based upon her CHA2DS2-VASc Score of 3.  Continue Rivaroxaban (Xarelto).   3. HTN Elevated today, patient brings in BP log which shows a steady increase in BP. Will resume lisinopril 20 mg daily. Reassess in 2 weeks.    Follow up in the AF clinic in two weeks.    [facility] [address] [place], [place] [address] [phone] 04/03/2020 3:29 PM

## 2020-04-03 ENCOUNTER — Ambulatory Visit (HOSPITAL_COMMUNITY)
Admission: RE | Admit: 2020-04-03 | Discharge: 2020-04-03 | Disposition: A | Discharge status: Home / Self Care | Payer: Medicare Other | Source: Ambulatory Visit | Attending: Physician Assistant | Admitting: Physician Assistant

## 2020-04-03 ENCOUNTER — Other Ambulatory Visit: Payer: Self-pay

## 2020-04-03 VITALS — BP 174/76 | HR 50 | Ht 66.25 in | Wt 181.8 lb

## 2020-04-03 DIAGNOSIS — Z7951 Long term (current) use of inhaled steroids: Secondary | ICD-10-CM | POA: Diagnosis not present

## 2020-04-03 DIAGNOSIS — J45909 Unspecified asthma, uncomplicated: Secondary | ICD-10-CM | POA: Insufficient documentation

## 2020-04-03 DIAGNOSIS — D6869 Other thrombophilia: Secondary | ICD-10-CM | POA: Insufficient documentation

## 2020-04-03 DIAGNOSIS — I10 Essential (primary) hypertension: Secondary | ICD-10-CM | POA: Insufficient documentation

## 2020-04-03 DIAGNOSIS — Z0000 Encounter for general adult medical examination without abnormal findings: Secondary | ICD-10-CM

## 2020-04-03 DIAGNOSIS — J4540 Moderate persistent asthma, uncomplicated: Secondary | ICD-10-CM

## 2020-04-03 DIAGNOSIS — Z87891 Personal history of nicotine dependence: Secondary | ICD-10-CM | POA: Diagnosis not present

## 2020-04-03 DIAGNOSIS — I480 Paroxysmal atrial fibrillation: Secondary | ICD-10-CM | POA: Insufficient documentation

## 2020-04-03 DIAGNOSIS — Z79899 Other long term (current) drug therapy: Secondary | ICD-10-CM | POA: Diagnosis not present

## 2020-04-03 DIAGNOSIS — Z7901 Long term (current) use of anticoagulants: Secondary | ICD-10-CM | POA: Insufficient documentation

## 2020-04-03 MED ORDER — LISINOPRIL 20 MG PO TABS: 20.0000 mg | ORAL_TABLET | Freq: Every day | ORAL | 3 refills | Status: DC

## 2020-04-03 MED ORDER — METOPROLOL SUCCINATE ER 50 MG PO TB24: 25.0000 mg | ORAL_TABLET | Freq: Every day | ORAL | 3 refills | Status: DC

## 2020-04-03 NOTE — Patient Instructions (Signed)
Decrease metoprolol to 25mg  a day (1/2 of your 50mg  tablet)  Start Lisinopril 20mg  once a day

## 2020-04-05 ENCOUNTER — Ambulatory Visit: Payer: Medicare Other | Attending: Internal Medicine

## 2020-04-05 DIAGNOSIS — Z23 Encounter for immunization: Secondary | ICD-10-CM

## 2020-04-05 NOTE — Progress Notes (Signed)
   Covid-19 Vaccination Clinic  Name:  Glenda Bauer    MRN: [id number] DOB: 07/20/1950  04/05/2020  [name] was observed post Covid-19 immunization for 15 minutes without incident. She was provided with Vaccine Information Sheet and instruction to access the V-Safe system.   [name] was instructed to call 911 with any severe reactions post vaccine: Marland Kitchen Difficulty breathing  . Swelling of face and throat  . A fast heartbeat  . A bad rash all over body  . Dizziness and weakness

## 2020-04-07 ENCOUNTER — Other Ambulatory Visit (HOSPITAL_COMMUNITY): Payer: Medicare Other

## 2020-04-08 ENCOUNTER — Ambulatory Visit (HOSPITAL_COMMUNITY)
Admission: RE | Admit: 2020-04-08 | Discharge: 2020-04-08 | Disposition: A | Discharge status: Home / Self Care | Payer: Medicare Other | Source: Ambulatory Visit | Attending: Physician Assistant | Admitting: Physician Assistant

## 2020-04-08 ENCOUNTER — Other Ambulatory Visit: Payer: Self-pay

## 2020-04-08 DIAGNOSIS — I4891 Unspecified atrial fibrillation: Secondary | ICD-10-CM | POA: Insufficient documentation

## 2020-04-08 DIAGNOSIS — I77819 Aortic ectasia, unspecified site: Secondary | ICD-10-CM | POA: Diagnosis not present

## 2020-04-08 DIAGNOSIS — I480 Paroxysmal atrial fibrillation: Secondary | ICD-10-CM | POA: Diagnosis not present

## 2020-04-08 DIAGNOSIS — I080 Rheumatic disorders of both mitral and aortic valves: Secondary | ICD-10-CM | POA: Diagnosis not present

## 2020-04-08 LAB — ECHOCARDIOGRAM COMPLETE
Area-P 1/2: 2.82 cm2
MV M vel: 5.3 m/s
MV Peak grad: 112.4 mmHg
P 1/2 time: 576 msec
Radius: 0.4 cm
S' Lateral: 2.9 cm

## 2020-04-08 NOTE — Progress Notes (Signed)
  Echocardiogram 2D Echocardiogram has been performed.  Glenda Bauer 04/08/2020, 9:50 AM

## 2020-04-17 ENCOUNTER — Ambulatory Visit (HOSPITAL_COMMUNITY)
Admission: RE | Admit: 2020-04-17 | Discharge: 2020-04-17 | Disposition: A | Discharge status: Home / Self Care | Payer: Medicare Other | Source: Ambulatory Visit | Attending: Physician Assistant | Admitting: Physician Assistant

## 2020-04-17 ENCOUNTER — Encounter (HOSPITAL_COMMUNITY): Payer: Self-pay | Admitting: Physician Assistant

## 2020-04-17 ENCOUNTER — Other Ambulatory Visit: Payer: Self-pay

## 2020-04-17 VITALS — BP 134/78 | HR 60 | Ht 66.25 in | Wt 179.2 lb

## 2020-04-17 DIAGNOSIS — I340 Nonrheumatic mitral (valve) insufficiency: Secondary | ICD-10-CM | POA: Insufficient documentation

## 2020-04-17 DIAGNOSIS — C84A0 Cutaneous T-cell lymphoma, unspecified, unspecified site: Secondary | ICD-10-CM | POA: Insufficient documentation

## 2020-04-17 DIAGNOSIS — D6869 Other thrombophilia: Secondary | ICD-10-CM | POA: Diagnosis not present

## 2020-04-17 DIAGNOSIS — I77810 Thoracic aortic ectasia: Secondary | ICD-10-CM | POA: Diagnosis not present

## 2020-04-17 DIAGNOSIS — I480 Paroxysmal atrial fibrillation: Secondary | ICD-10-CM | POA: Insufficient documentation

## 2020-04-17 DIAGNOSIS — Z79899 Other long term (current) drug therapy: Secondary | ICD-10-CM | POA: Diagnosis not present

## 2020-04-17 DIAGNOSIS — Z7901 Long term (current) use of anticoagulants: Secondary | ICD-10-CM | POA: Insufficient documentation

## 2020-04-17 DIAGNOSIS — I10 Essential (primary) hypertension: Secondary | ICD-10-CM | POA: Diagnosis not present

## 2020-04-17 LAB — BASIC METABOLIC PANEL
Anion gap: 7 (ref 5–15)
BUN: 17 mg/dL (ref 8–23)
CO2: 24 mmol/L (ref 22–32)
Calcium: 9.1 mg/dL (ref 8.9–10.3)
Chloride: 111 mmol/L (ref 98–111)
Creatinine, Ser: 0.83 mg/dL (ref 0.44–1.00)
GFR, Estimated: >60 mL/min (ref >60)
Glucose, Bld: 105 mg/dL — ABNORMAL HIGH (ref 70–99)
Potassium: 3.9 mmol/L (ref 3.5–5.1)
Sodium: 142 mmol/L (ref 135–145)

## 2020-04-17 MED ORDER — FLECAINIDE ACETATE 50 MG PO TABS: 50.0000 mg | ORAL_TABLET | Freq: Two times a day (BID) | ORAL | 3 refills | Status: DC

## 2020-04-17 NOTE — Patient Instructions (Signed)
Start flecainide 50mg twice a day 

## 2020-04-17 NOTE — Progress Notes (Signed)
Primary Care Physician: [name], NP Primary Cardiologist: none Primary Electrophysiologist: none Referring Physician: [name]   Glenda Bauer is a 69 y.o. female with a history of HTN, cutaneous T-cell lymphoma, and atrial fibrillation who presents for follow up in the [facility].  The patient was initially diagnosed with atrial fibrillation 03/27/20 at her routine PCP visit. She does admit that she had been having more frequent palpitations for a few months prior but these symptoms were brief. ECG showed afib HR 137. Patient was started on Xarelto for a CHADS2VASC score of 3 and metoprolol for rate control. She denies any significant snoring or alcohol use.   On follow up today, patient reports that her symptoms of palpitations have improved with the addition of metoprolol but she continues to have episodes several times per week lasting ~ 1 hour. She denies any bleeding issues on anticoagulation.   Today, she denies symptoms of chest pain, shortness of breath, orthopnea, PND, lower extremity edema, dizziness, presyncope, syncope, snoring, daytime somnolence, bleeding, or neurologic sequela. The patient is tolerating medications without difficulties and is otherwise without complaint today.    Atrial Fibrillation Risk Factors:  she does not have symptoms or diagnosis of sleep apnea. she does not have a history of rheumatic fever. she does not have a history of alcohol use. The patient does not have a history of early familial atrial fibrillation or other arrhythmias.  she has a BMI of Body mass index is 28.71 kg/m.Marland Kitchen Filed Weights   04/17/20 1138  Weight: 81.3 kg    Family History  Problem Relation Age of Onset  . Arthritis Mother   . Ovarian cancer Mother   . Breast cancer Mother   . Parkinson's disease Father   . Crohn's disease Brother   . Schizophrenia Brother   . Colon cancer Neg Hx   . Esophageal cancer Neg Hx   . Rectal  cancer Neg Hx   . Stomach cancer Neg Hx      Atrial Fibrillation Management history:  Previous antiarrhythmic drugs: none Previous cardioversions: none Previous ablations: none CHADS2VASC score: 3 Anticoagulation history: Xarelto    Past Medical History:  Diagnosis Date  . Allergy   . Anxiety   . Asthma   . Cancer (HCC)    Melanoma and Mycosis Fungosis  . Cataract   . Cutaneous T-cell lymphoma [facility]) 2011   Dermatologist treated in [place]; Dr. Hipolito Bayley  . Depression   . GERD (gastroesophageal reflux disease)   . Heart murmur   . Hypertension   . Neuromuscular disorder ([place])   . Osteoarthritis   . Thyroid nodule   . Urinary tract infection    Past Surgical History:  Procedure Laterality Date  . Cataracts Bilateral   . CHOLECYSTECTOMY  2015  . melenoma removal  1979  . Sinus Polyps       Current Outpatient Medications  Medication Sig Dispense Refill  . albuterol (PROVENTIL) (2.5 MG/3ML) 0.083% nebulizer solution USE 1 VIAL VIA NEBULIZER EVERY 6 HOURS AS NEEDED FOR WHEEZING OR SHORTNESS OF BREATH 225 mL 0  . ALPRAZolam (XANAX) 0.25 MG tablet TAKE 1 TO 2 TABLETS BY MOUTH DAILY AS NEEDED 30 tablet 2  . famotidine (PEPCID) 20 MG tablet Take 20 mg by mouth daily.    Marland Kitchen FLUoxetine (PROZAC) 20 MG capsule TAKE 1 CAPSULE(20 MG) BY MOUTH DAILY 90 capsule 0  . fluticasone (FLONASE) 50 MCG/ACT nasal spray Place 2 sprays into both nostrils daily.    Marland Kitchen  fluticasone furoate-vilanterol (BREO ELLIPTA) 200-25 MCG/INH AEPB INHALE 1 PUFF INTO THE LUNGS DAILY 180 each 3  . levocetirizine (XYZAL) 5 MG tablet Take 5 mg by mouth every evening.    Marland Kitchen lisinopril (ZESTRIL) 20 MG tablet Take 1 tablet (20 mg total) by mouth daily. 30 tablet 3  . metoprolol succinate (TOPROL-XL) 50 MG 24 hr tablet Take 0.5 tablets (25 mg total) by mouth daily. Take with or immediately following a meal. 90 tablet 3  . montelukast (SINGULAIR) 10 MG tablet Take 1 tablet (10 mg total) by mouth at bedtime. 90 tablet  3  . Multiple Vitamin (MULTIVITAMIN) tablet Take 1 tablet by mouth daily.    . Multiple Vitamins-Minerals (VISION FORMULA PO) Take by mouth daily.     Marland Kitchen PROAIR RESPICLICK 191 (90 Base) MCG/ACT AEPB INHALE 2 PUFFS INTO THE LUNGS EVERY 6 HOURS AS NEEDED 1 each 2  . Probiotic Product (ALIGN) 4 MG CAPS Take by mouth daily.    . rivaroxaban (XARELTO) 20 MG TABS tablet Take 1 tablet (20 mg total) by mouth daily with supper. 30 tablet 1  . simvastatin (ZOCOR) 10 MG tablet Take 1 tablet (10 mg total) by mouth at bedtime. 90 tablet 1  . triamcinolone cream (KENALOG) 0.1 % Apply 1 application topically 2 (two) times daily. 453.6 g 1   No current facility-administered medications for this encounter.    No Known Allergies  Social History   Socioeconomic History  . Marital status: Divorced    Spouse name: Not on file  . Number of children: 3  . Years of education: 66  . Highest education level: Not on file  Occupational History  . Occupation: Medical sales representative   Tobacco Use  . Smoking status: Former Smoker    Packs/day: 0.25    Years: 10.00    Pack years: 2.50    Types: Cigarettes  . Smokeless tobacco: Never Used  . Tobacco comment: 35 years ago; smoke socially  Vaping Use  . Vaping Use: Never used  Substance and Sexual Activity  . Alcohol use: No    Alcohol/week: 0.0 standard drinks  . Drug use: No  . Sexual activity: Not on file  Other Topics Concern  . Not on file  Social History Narrative   Separated    Three children ( one lives locally)    Social Determinants of Health   Financial Resource Strain:   . Difficulty of Paying Living Expenses: Not on file  Food Insecurity:   . Worried About Charity fundraiser in the Last Year: Not on file  . Ran Out of Food in the Last Year: Not on file  Transportation Needs:   . Lack of Transportation (Medical): Not on file  . Lack of Transportation (Non-Medical): Not on file  Physical Activity:   . Days of Exercise per Week: Not on file  .  Minutes of Exercise per Session: Not on file  Stress:   . Feeling of Stress : Not on file  Social Connections:   . Frequency of Communication with Friends and Family: Not on file  . Frequency of Social Gatherings with Friends and Family: Not on file  . Attends Religious Services: Not on file  . Active Member of Clubs or Organizations: Not on file  . Attends Archivist Meetings: Not on file  . Marital Status: Not on file  Intimate Partner Violence:   . Fear of Current or Ex-Partner: Not on file  . Emotionally Abused: Not on file  .  Physically Abused: Not on file  . Sexually Abused: Not on file     ROS- All systems are reviewed and negative except as per the HPI above.  Physical Exam: Vitals:   04/17/20 1138  BP: 134/78  Pulse: 60  Weight: 81.3 kg  Height: 5' 6.25" (1.683 m)    GEN- The patient is well appearing, alert and oriented x 3 today.   HEENT-head normocephalic, atraumatic, sclera clear, conjunctiva pink, hearing intact, trachea midline. Lungs- Clear to ausculation bilaterally, normal work of breathing Heart- Regular rate and rhythm, no murmurs, rubs or gallops  GI- soft, NT, ND, + BS Extremities- no clubbing, cyanosis, or edema MS- no significant deformity or atrophy Skin- no rash or lesion Psych- euthymic mood, full affect Neuro- strength and sensation are intact   Wt Readings from Last 3 Encounters:  04/17/20 81.3 kg  04/03/20 82.5 kg  03/27/20 80.3 kg    EKG today demonstrates SR HR 60, PAC, PR 158, QRS 80, QTc 470  Echo 04/08/20 demonstrated 1. Left ventricular ejection fraction, by estimation, is 50 to 55%. The  left ventricle has low normal function. The left ventricle has no regional  wall motion abnormalities. Left ventricular diastolic parameters are  indeterminate.  2. Right ventricular systolic function is normal. The right ventricular  size is normal. There is normal pulmonary artery systolic pressure.  3. The mitral valve is  normal in structure. Mild to moderate mitral valve  regurgitation. No evidence of mitral stenosis.  4. The aortic valve is normal in structure. Aortic valve regurgitation is  mild to moderate. No aortic stenosis is present.  5. Aortic dilatation noted. There is mild to moderate dilatation of the  ascending aorta, measuring 44 mm.   Epic records are reviewed at length today  CHA2DS2-VASc Score = 3  The patient's score is based upon: CHF History: 0 HTN History: 1 Diabetes History: 0 Stroke History: 0 Vascular Disease History: 0 Age Score: 1 Gender Score: 1      ASSESSMENT AND PLAN: 1. Paroxysmal Atrial Fibrillation (ICD10:  I48.0) The patient's CHA2DS2-VASc score is 3, indicating a 3.2% annual risk of stroke.   Patient still having frequent symptoms of palpitations.  We discussed AAD vs ablation today. Will plan to start flecainide 50 mg BID.  Continue Toprol 25 mg daily Continue Xarelto 20 mg daily  2. Secondary Hypercoagulable State (ICD10:  D68.69) The patient is at significant risk for stroke/thromboembolism based upon her CHA2DS2-VASc Score of 3.  Continue Rivaroxaban (Xarelto).   3. HTN Stable, no changes today. Check bmet.  4. Ascending aorta dilatation 44 mm on recent echo. Will refer to general cardiology.   5. MR Mild-mod MR, no stenosis.   Follow up in the AF clinic next week for ECG after starting flecainide.    [facility] [address] [place], [place] [address] [phone] 04/17/2020 11:45 AM

## 2020-04-22 ENCOUNTER — Ambulatory Visit (HOSPITAL_COMMUNITY)
Admission: RE | Admit: 2020-04-22 | Discharge: 2020-04-22 | Disposition: A | Discharge status: Home / Self Care | Payer: Medicare Other | Source: Ambulatory Visit | Attending: Physician Assistant | Admitting: Physician Assistant

## 2020-04-22 ENCOUNTER — Other Ambulatory Visit: Payer: Self-pay

## 2020-04-22 VITALS — BP 160/90 | HR 57

## 2020-04-22 DIAGNOSIS — I4891 Unspecified atrial fibrillation: Secondary | ICD-10-CM | POA: Diagnosis not present

## 2020-04-22 DIAGNOSIS — I480 Paroxysmal atrial fibrillation: Secondary | ICD-10-CM

## 2020-04-22 MED ORDER — LISINOPRIL-HYDROCHLOROTHIAZIDE 20-12.5 MG PO TABS: 1.0000 | ORAL_TABLET | Freq: Every day | ORAL | 3 refills | Status: DC

## 2020-04-22 NOTE — Patient Instructions (Signed)
Resume lisinopril/hctz once a day

## 2020-04-22 NOTE — Progress Notes (Signed)
Patient returns for ECG after starting flecainide. ECG shows SB HR 57, PR 166, QRS 84, QTc 473. Patient reports she has had no palpitations since starting flecainide. Her BP has been elevated again at home and here in office. Will resume lisinopril HCTZ 20-12.5 mg daily. Would recheck bmet at follow up. Follow up with Dr Johney Frame as scheduled, AF clinic in 3 months.

## 2020-04-30 ENCOUNTER — Other Ambulatory Visit: Payer: Self-pay | Admitting: Adult Health

## 2020-04-30 DIAGNOSIS — J4540 Moderate persistent asthma, uncomplicated: Secondary | ICD-10-CM

## 2020-05-11 NOTE — Progress Notes (Signed)
Cardiology Office Note:    Date:  05/13/2020   ID:  Glenda Bauer, DOB 10-01-1950, MRN [id number]  PCP:  [name], NP  [facility] Cardiologist:  No primary care provider on file.  [facility] Electrophysiologist:  None   Referring MD: [name], PA    History of Present Illness:    Glenda Bauer is a 69 y.o. female with a hx of HTN, cutaneous T-cell lymphoma and atrial fibrillation managed by Afib clinic who was referred by Malka So, PA-C for her HTN and Afib.  The patient was initially diagnosed with atrial fibrillation 03/27/20 at her routine PCP visit. She does admit that she had been having more frequent palpitations for a few months prior but these symptoms were brief. ECG showed afib HR 137. Patient was started on Xarelto for a CHADS2VASC score of 3 and metoprolol for rate control. She denies any significant snoring or alcohol use.   She has been followed by Afib clinic and was initiated on flecainide with return to NSR. Her blood pressures were elevated at home and her lisinopril-HCTZ were resumed. She now presents for follow-up.  The patient states that she feels so much better since being in rhythm for the past 2 weeks. She has resumed walking and is able to complete about 1.77miles/day. Had initial SOB with exertion but this has improved over the course of the past couple of weeks. No chest pain on exertion. No LE edema, orthopnea, or PND. Sleeps on 3 pillows due to asthma which is unchanged for years. No lightheadedness, dizziness or syncope. Has resumed doing pilates as well to help build strength.  Blood pressure have been running 130-140/80-90s at home. She has been trying to lose weight. Dropped about 16lbs since the summer.   TC 208, HDL 77, LDL 110. Recently started on simvastatin 10mg  daily by PCP.   Past Medical History:  Diagnosis Date  . Allergy   . Anxiety   . Asthma   . Cancer (HCC)    Melanoma and Mycosis Fungosis  . Cataract    . Cutaneous T-cell lymphoma [facility]) 2011   Dermatologist treated in [place]; Dr. Hipolito Bayley  . Depression   . GERD (gastroesophageal reflux disease)   . Heart murmur   . Hypertension   . Neuromuscular disorder ([place])   . Osteoarthritis   . Thyroid nodule   . Urinary tract infection     Past Surgical History:  Procedure Laterality Date  . Cataracts Bilateral   . CHOLECYSTECTOMY  2015  . melenoma removal  1979  . Sinus Polyps       Current Medications: Current Meds  Medication Sig  . albuterol (PROVENTIL) (2.5 MG/3ML) 0.083% nebulizer solution USE 1 VIAL VIA NEBULIZER EVERY 6 HOURS AS NEEDED FOR WHEEZING OR SHORTNESS OF BREATH  . ALPRAZolam (XANAX) 0.25 MG tablet TAKE 1 TO 2 TABLETS BY MOUTH DAILY AS NEEDED  . famotidine (PEPCID) 20 MG tablet Take 20 mg by mouth daily.  . flecainide (TAMBOCOR) 50 MG tablet Take 1 tablet (50 mg total) by mouth 2 (two) times daily.  Marland Kitchen FLUoxetine (PROZAC) 20 MG capsule TAKE 1 CAPSULE(20 MG) BY MOUTH DAILY  . fluticasone (FLONASE) 50 MCG/ACT nasal spray Place 2 sprays into both nostrils daily.  . fluticasone furoate-vilanterol (BREO ELLIPTA) 200-25 MCG/INH AEPB INHALE 1 PUFF INTO THE LUNGS DAILY  . levocetirizine (XYZAL) 5 MG tablet Take 5 mg by mouth every evening.  . metoprolol succinate (TOPROL-XL) 50 MG 24 hr tablet Take 0.5 tablets (  25 mg total) by mouth daily. Take with or immediately following a meal.  . montelukast (SINGULAIR) 10 MG tablet Take 1 tablet (10 mg total) by mouth at bedtime.  . Multiple Vitamin (MULTIVITAMIN) tablet Take 1 tablet by mouth daily.  . Multiple Vitamins-Minerals (VISION FORMULA PO) Take by mouth daily.   Marland Kitchen PROAIR RESPICLICK 124 (90 Base) MCG/ACT AEPB INHALE 2 PUFFS INTO THE LUNGS EVERY 6 HOURS AS NEEDED  . Probiotic Product (ALIGN) 4 MG CAPS Take by mouth daily.  . rivaroxaban (XARELTO) 20 MG TABS tablet Take 1 tablet (20 mg total) by mouth daily with supper.  . simvastatin (ZOCOR) 10 MG tablet Take 1 tablet (10 mg  total) by mouth at bedtime.  . triamcinolone cream (KENALOG) 0.1 % Apply 1 application topically 2 (two) times daily.  . [DISCONTINUED] lisinopril-hydrochlorothiazide (ZESTORETIC) 20-12.5 MG tablet Take 1 tablet by mouth daily.     Allergies:   Patient has no known allergies.   Social History   Socioeconomic History  . Marital status: Divorced    Spouse name: Not on file  . Number of children: 3  . Years of education: 5  . Highest education level: Not on file  Occupational History  . Occupation: Medical sales representative   Tobacco Use  . Smoking status: Former Smoker    Packs/day: 0.25    Years: 10.00    Pack years: 2.50    Types: Cigarettes  . Smokeless tobacco: Never Used  . Tobacco comment: 35 years ago; smoke socially  Vaping Use  . Vaping Use: Never used  Substance and Sexual Activity  . Alcohol use: No    Alcohol/week: 0.0 standard drinks  . Drug use: No  . Sexual activity: Not on file  Other Topics Concern  . Not on file  Social History Narrative   Separated    Three children ( one lives locally)    Social Determinants of Health   Financial Resource Strain:   . Difficulty of Paying Living Expenses: Not on file  Food Insecurity:   . Worried About Charity fundraiser in the Last Year: Not on file  . Ran Out of Food in the Last Year: Not on file  Transportation Needs:   . Lack of Transportation (Medical): Not on file  . Lack of Transportation (Non-Medical): Not on file  Physical Activity:   . Days of Exercise per Week: Not on file  . Minutes of Exercise per Session: Not on file  Stress:   . Feeling of Stress : Not on file  Social Connections:   . Frequency of Communication with Friends and Family: Not on file  . Frequency of Social Gatherings with Friends and Family: Not on file  . Attends Religious Services: Not on file  . Active Member of Clubs or Organizations: Not on file  . Attends Archivist Meetings: Not on file  . Marital Status: Not on file      Family History: The patient's family history includes Arthritis in her mother; Breast cancer in her mother; Crohn's disease in her brother; Ovarian cancer in her mother; Parkinson's disease in her father; Schizophrenia in her brother. There is no history of Colon cancer, Esophageal cancer, Rectal cancer, or Stomach cancer.  ROS:   Please see the history of present illness.    Review of Systems  Constitutional: Negative for chills and fever.  HENT: Positive for nosebleeds and sinus pain.   Eyes: Negative for blurred vision.  Respiratory: Positive for shortness of breath. Negative  for sputum production.   Cardiovascular: Negative for chest pain, palpitations, orthopnea, claudication, leg swelling and PND.  Gastrointestinal: Positive for heartburn. Negative for blood in stool, nausea and vomiting.  Genitourinary: Negative for hematuria.  Musculoskeletal: Positive for myalgias.  Neurological: Negative for dizziness and loss of consciousness.  Endo/Heme/Allergies: Negative for polydipsia.  Psychiatric/Behavioral: Negative for substance abuse.    EKGs/Labs/Other Studies Reviewed:    The following studies were reviewed today: TTE 05/07/2020: IMPRESSIONS  1. Left ventricular ejection fraction, by estimation, is 50 to 55%. The  left ventricle has low normal function. The left ventricle has no regional  wall motion abnormalities. Left ventricular diastolic parameters are  indeterminate.  2. Right ventricular systolic function is normal. The right ventricular  size is normal. There is normal pulmonary artery systolic pressure.  3. The mitral valve is normal in structure. Mild to moderate mitral valve  regurgitation. No evidence of mitral stenosis.  4. The aortic valve is normal in structure. Aortic valve regurgitation is  mild to moderate. No aortic stenosis is present.  5. Aortic dilatation noted. There is mild to moderate dilatation of the  ascending aorta, measuring 44 mm.  EKG:   EKG is  ordered today.  The ekg ordered today demonstrates sinus bradycardia with HR 57. Normal intervals. Normal QRS.  Recent Labs: 03/27/2020: ALT 20; Hemoglobin 16.7; Platelets 243; TSH 1.26 04/17/2020: BUN 17; Creatinine, Ser 0.83; Potassium 3.9; Sodium 142  Recent Lipid Panel    Component Value Date/Time   CHOL 208 (H) 03/27/2020 0917   TRIG 100 03/27/2020 0917   HDL 77 03/27/2020 0917   CHOLHDL 2.7 03/27/2020 0917   VLDL 14.0 02/06/2019 0748   LDLCALC 110 (H) 03/27/2020 0917     Risk Assessment/Calculations:    CHA2DS2-VASc Score = 3  This indicates a 3.2% annual risk of stroke. The patient's score is based upon: CHF History: 0 HTN History: 1 Diabetes History: 0 Stroke History: 0 Vascular Disease History: 0 Age Score: 1 Gender Score: 1    Physical Exam:    VS:  BP 120/70   Pulse (!) 57   Ht 5\' 6"  (1.676 m)   Wt 174 lb (78.9 kg)   SpO2 96%   BMI 28.08 kg/m     Wt Readings from Last 3 Encounters:  05/13/20 174 lb (78.9 kg)  04/17/20 179 lb 3.2 oz (81.3 kg)  04/03/20 181 lb 12.8 oz (82.5 kg)     GEN:  Well nourished, well developed in no acute distress HEENT: Normal NECK: No JVD; No carotid bruits CARDIAC: RRR, no murmurs, rubs, gallops RESPIRATORY:  Clear to auscultation without rales, wheezing or rhonchi  ABDOMEN: Soft, non-tender, non-distended MUSCULOSKELETAL:  No edema; No deformity  SKIN: Warm and dry NEUROLOGIC:  Alert and oriented x 3 PSYCHIATRIC:  Normal affect   ASSESSMENT:    1. Essential hypertension   2. Thoracic aortic aneurysm without rupture (HCC)   3. Paroxysmal atrial fibrillation (HCC)    PLAN:    In order of problems listed above:  #Paroxysmal Afib: CHADs-vasc 3. Now back in rhythm since starting flecainide. On xarelto for [facility] which she is tolerating well. Followed by Afib clinic. -Continue flecainide 50mg  BID -Continue xarelto for [facility] -Continue metop 50mg  XL -Follow-up with Afib clinic as scheduled  #Mild-to-moderate  ascending aortic dilation Measures 8mm on TTE. -Will do CT chest in 20months to assess for interval change -If stable on CT chest; can switch to yearly monitoring with TTEs/CTs -Aggressive blood pressure control -Avoid heavy  lifting >30lbs; discussed with the patient -Okay for weight bearing strength exercises like pilates  #HTN Elevated to 130-140s at home.  -Increase lisinopril-HTCZ 20mg -12.5mg  to 1.5 pills per day -Continue metop 50mg  XL -Repeat BMET in 1 week -If remains elevated at home; will increase to lisinopril-HCTZ to 40mg -25mg  daily  #HLD: Managed by PCP. -Continue simvastatin 10mg  daily -Lipid panel per PCP -Will plan on CT chest for thoracic aneurysm and if coronary calcium; can change LDL goal to <70    Medication Adjustments/Labs and Tests Ordered: Current medicines are reviewed at length with the patient today.  Concerns regarding medicines are outlined above.  Orders Placed This Encounter  Procedures  . CT ANGIO CHEST AORTA W/CM & OR WO/CM  . Basic Metabolic Panel (BMET)   Meds ordered this encounter  Medications  . lisinopril-hydrochlorothiazide (ZESTORETIC) 20-12.5 MG tablet    Sig: Take 2 tablets by mouth daily.    Dispense:  180 tablet    Refill:  2    Patient Instructions  Medication Instructions:  Your physician has recommended you make the following change in your medication: Increase lisinopril/HCTZ to 2 tablets by mouth daily  *If you need a refill on your cardiac medications before your next appointment, please call your pharmacy*   Lab Work: Your physician recommends that you return for lab work on 05/20/20--BMP.  The lab opens at 7:30 AM.  This is not fasting   If you have labs (blood work) drawn today and your tests are completely normal, you will receive your results only by: Marland Kitchen MyChart Message (if you have MyChart) OR . A paper copy in the mail If you have any lab test that is abnormal or we need to change your treatment, we will call  you to review the results.   Testing/Procedures: Non-Cardiac CT scanning, (CAT scanning), is a noninvasive, special x-ray that produces cross-sectional images of the body using x-rays and a computer. CT scans help physicians diagnose and treat medical conditions. For some CT exams, a contrast material is used to enhance visibility in the area of the body being studied. CT scans provide greater clarity and reveal more details than regular x-ray exams. To be done in 6 months at [facility] in [place]: At [facility], you and your health needs are our priority.  As part of our continuing mission to provide you with exceptional heart care, we have created designated Provider Care Teams.  These Care Teams include your primary Cardiologist (physician) and Advanced Practice Providers (APPs -  Physician Assistants and Nurse Practitioners) who all work together to provide you with the care you need, when you need it.  We recommend signing up for the patient portal called "MyChart".  Sign up information is provided on this After Visit Summary.  MyChart is used to connect with patients for Virtual Visits (Telemedicine).  Patients are able to view lab/test results, encounter notes, upcoming appointments, etc.  Non-urgent messages can be sent to your provider as well.   To learn more about what you can do with MyChart, go to [email or web address].    Your next appointment:   June 10,2022 at 8:00-- CT scan should be done before this appointment  The format for your next appointment:   In Person  Provider:   Gwyndolyn Kaufman, MD   Other Instructions      Signed, [name], MD  05/13/2020 10:00 AM    [place]

## 2020-05-13 ENCOUNTER — Ambulatory Visit: Payer: Medicare Other | Admitting: Cardiology

## 2020-05-13 ENCOUNTER — Encounter: Payer: Self-pay | Admitting: Cardiology

## 2020-05-13 ENCOUNTER — Other Ambulatory Visit: Payer: Self-pay

## 2020-05-13 VITALS — BP 120/70 | HR 57 | Ht 66.0 in | Wt 174.0 lb

## 2020-05-13 DIAGNOSIS — I712 Thoracic aortic aneurysm, without rupture, unspecified: Secondary | ICD-10-CM

## 2020-05-13 DIAGNOSIS — I10 Essential (primary) hypertension: Secondary | ICD-10-CM

## 2020-05-13 DIAGNOSIS — I480 Paroxysmal atrial fibrillation: Secondary | ICD-10-CM

## 2020-05-13 MED ORDER — LISINOPRIL-HYDROCHLOROTHIAZIDE 20-12.5 MG PO TABS: 2.0000 | ORAL_TABLET | Freq: Every day | ORAL | 2 refills | Status: DC

## 2020-05-13 NOTE — Patient Instructions (Addendum)
Medication Instructions:  Your physician has recommended you make the following change in your medication: Increase lisinopril/HCTZ to 2 tablets by mouth daily  *If you need a refill on your cardiac medications before your next appointment, please call your pharmacy*   Lab Work: Your physician recommends that you return for lab work on 05/20/20--BMP.  The lab opens at 7:30 AM.  This is not fasting   If you have labs (blood work) drawn today and your tests are completely normal, you will receive your results only by: Marland Kitchen MyChart Message (if you have MyChart) OR . A paper copy in the mail If you have any lab test that is abnormal or we need to change your treatment, we will call you to review the results.   Testing/Procedures: Non-Cardiac CT scanning, (CAT scanning), is a noninvasive, special x-ray that produces cross-sectional images of the body using x-rays and a computer. CT scans help physicians diagnose and treat medical conditions. For some CT exams, a contrast material is used to enhance visibility in the area of the body being studied. CT scans provide greater clarity and reveal more details than regular x-ray exams. To be done in 6 months at [facility] in [place]: At [facility], you and your health needs are our priority.  As part of our continuing mission to provide you with exceptional heart care, we have created designated Provider Care Teams.  These Care Teams include your primary Cardiologist (physician) and Advanced Practice Providers (APPs -  Physician Assistants and Nurse Practitioners) who all work together to provide you with the care you need, when you need it.  We recommend signing up for the patient portal called "MyChart".  Sign up information is provided on this After Visit Summary.  MyChart is used to connect with patients for Virtual Visits (Telemedicine).  Patients are able to view lab/test results, encounter notes, upcoming  appointments, etc.  Non-urgent messages can be sent to your provider as well.   To learn more about what you can do with MyChart, go to [email or web address].    Your next appointment:   June 10,2022 at 8:00-- CT scan should be done before this appointment  The format for your next appointment:   In Person  Provider:   Gwyndolyn Kaufman, MD   Other Instructions

## 2020-05-20 ENCOUNTER — Other Ambulatory Visit: Payer: Medicare Other

## 2020-05-21 ENCOUNTER — Other Ambulatory Visit: Payer: Self-pay

## 2020-05-21 ENCOUNTER — Other Ambulatory Visit: Payer: Medicare Other | Admitting: *Deleted

## 2020-05-21 ENCOUNTER — Other Ambulatory Visit: Payer: Self-pay | Admitting: Adult Health

## 2020-05-21 DIAGNOSIS — I10 Essential (primary) hypertension: Secondary | ICD-10-CM | POA: Diagnosis not present

## 2020-05-21 DIAGNOSIS — J4540 Moderate persistent asthma, uncomplicated: Secondary | ICD-10-CM

## 2020-05-21 DIAGNOSIS — I4891 Unspecified atrial fibrillation: Secondary | ICD-10-CM

## 2020-05-21 DIAGNOSIS — Z0000 Encounter for general adult medical examination without abnormal findings: Secondary | ICD-10-CM

## 2020-05-21 LAB — BASIC METABOLIC PANEL
BUN/Creatinine Ratio: 31 — ABNORMAL HIGH (ref 12–28)
BUN: 30 mg/dL — ABNORMAL HIGH (ref 8–27)
CO2: 23 mmol/L (ref 20–29)
Calcium: 9.7 mg/dL (ref 8.7–10.3)
Chloride: 100 mmol/L (ref 96–106)
Creatinine, Ser: 0.97 mg/dL (ref 0.57–1.00)
GFR calc Af Amer: 68 mL/min/{1.73_m2} (ref >59)
GFR calc non Af Amer: 59 mL/min/{1.73_m2} — ABNORMAL LOW (ref >59)
Glucose: 106 mg/dL — ABNORMAL HIGH (ref 65–99)
Potassium: 3.9 mmol/L (ref 3.5–5.2)
Sodium: 137 mmol/L (ref 134–144)

## 2020-05-22 ENCOUNTER — Telehealth: Payer: Self-pay

## 2020-05-22 ENCOUNTER — Telehealth: Payer: Self-pay | Admitting: Cardiology

## 2020-05-22 NOTE — Telephone Encounter (Signed)
Pt advised her lab results and verbalized understanding. Pt reports her BP has been doing well I asked her for a few readings:   129/84, 119/79, 123/91   She did not have any of her HR's noted.  I talked to her about s/sx of becoming dehydrated and she will call us if she feels she is getting too "dry"... she will continue to monitor everything and call us if she has any problems.

## 2020-05-22 NOTE — Telephone Encounter (Signed)
Patient returning call for lab results. 

## 2020-05-22 NOTE — Telephone Encounter (Signed)
-----   Message from [name], MD sent at 05/22/2020  8:21 AM EST ----- Her labs look overall stable. Her renal function or Cr went up a little bit which is to be expected on an increased dose of lisinopril. How are her blood pressures doing at home? Also, we want to make sure she is not getting dehydrated on the increased dose of hydrochlorthiazide so if she is finding this to be the case, please let us know. It she overall feels well and the blood pressures are good, no changes in medications.

## 2020-05-22 NOTE — Telephone Encounter (Signed)
Pt advised.. please see other encounter note re: this message.

## 2020-06-02 ENCOUNTER — Other Ambulatory Visit: Payer: Medicare Other

## 2020-06-05 ENCOUNTER — Other Ambulatory Visit: Payer: Self-pay | Admitting: Adult Health

## 2020-06-05 NOTE — Telephone Encounter (Signed)
Last OV 03-27-20 Last refill 02-26-20  Please advise

## 2020-06-07 DIAGNOSIS — L723 Sebaceous cyst: Secondary | ICD-10-CM

## 2020-06-07 HISTORY — DX: Sebaceous cyst: L72.3

## 2020-06-20 ENCOUNTER — Telehealth: Payer: Self-pay | Admitting: Adult Health

## 2020-06-20 NOTE — Telephone Encounter (Signed)
Left message for patient to call back and schedule Medicare Annual Wellness Visit (AWV) either virtually or in office.   Last AWV 11/14/14  please schedule at anytime with [facility] Nurse Health Advisor 1 or 2   This should be a 45 minute visit.

## 2020-07-23 ENCOUNTER — Ambulatory Visit (HOSPITAL_COMMUNITY): Payer: Medicare Other | Admitting: Physician Assistant

## 2020-07-24 ENCOUNTER — Telehealth: Payer: Self-pay | Admitting: Adult Health

## 2020-07-24 NOTE — Progress Notes (Signed)
  Chronic Care Management   Outreach Note  07/24/2020 Name: Glenda Bauer MRN: [id number] DOB: 1950-09-27  Referred by: [name], NP Reason for referral : No chief complaint on file.   An unsuccessful telephone outreach was attempted today. The patient was referred to the pharmacist for assistance with care management and care coordination.   Follow Up Plan:   Carley Perdue [employer] Scheduler

## 2020-07-24 NOTE — Progress Notes (Addendum)
Primary Care Physician: [name], NP Primary Cardiologist: Dr Johney Frame  Primary Electrophysiologist: none Referring Physician: [name]   Glenda Bauer is a 70 y.o. female with a history of HTN, cutaneous T-cell lymphoma, and atrial fibrillation who presents for follow up in the [facility].  The patient was initially diagnosed with atrial fibrillation 03/27/20 at her routine PCP visit. She does admit that she had been having more frequent palpitations for a few months prior but these symptoms were brief. ECG showed afib HR 137. Patient was started on Xarelto for a CHADS2VASC score of 3 and metoprolol for rate control. She denies any significant snoring or alcohol use.   On follow up today, patient reports she has done reasonably well since her last visit. She has had 3 episodes of heart racing, the longest was 2 hours. She feels stress at work contributed. She denies any bleeding issues on anticoagulation.   Today, she denies symptoms of chest pain, shortness of breath, orthopnea, PND, lower extremity edema, dizziness, presyncope, syncope, snoring, daytime somnolence, bleeding, or neurologic sequela. The patient is tolerating medications without difficulties and is otherwise without complaint today.    Atrial Fibrillation Risk Factors:  she does not have symptoms or diagnosis of sleep apnea. she does not have a history of rheumatic fever. she does not have a history of alcohol use. The patient does not have a history of early familial atrial fibrillation or other arrhythmias.  she has a BMI of Body mass index is 28.6 kg/m.Marland Kitchen Filed Weights   07/25/20 0944  Weight: 80.4 kg    Family History  Problem Relation Age of Onset  . Arthritis Mother   . Ovarian cancer Mother   . Breast cancer Mother   . Parkinson's disease Father   . Crohn's disease Brother   . Schizophrenia Brother   . Colon cancer Neg Hx   . Esophageal cancer Neg Hx   .  Rectal cancer Neg Hx   . Stomach cancer Neg Hx      Atrial Fibrillation Management history:  Previous antiarrhythmic drugs: none Previous cardioversions: none Previous ablations: none CHADS2VASC score: 3 Anticoagulation history: Xarelto    Past Medical History:  Diagnosis Date  . Allergy   . Anxiety   . Asthma   . Cancer (HCC)    Melanoma and Mycosis Fungosis  . Cataract   . Cutaneous T-cell lymphoma [facility]) 2011   Dermatologist treated in [place]; Dr. Hipolito Bayley  . Depression   . GERD (gastroesophageal reflux disease)   . Heart murmur   . Hypertension   . Neuromuscular disorder ([place])   . Osteoarthritis   . Thyroid nodule   . Urinary tract infection    Past Surgical History:  Procedure Laterality Date  . Cataracts Bilateral   . CHOLECYSTECTOMY  2015  . melenoma removal  1979  . Sinus Polyps       Current Outpatient Medications  Medication Sig Dispense Refill  . albuterol (PROVENTIL) (2.5 MG/3ML) 0.083% nebulizer solution USE 1 VIAL VIA NEBULIZER EVERY 6 HOURS AS NEEDED FOR WHEEZING OR SHORTNESS OF BREATH 225 mL 0  . ALPRAZolam (XANAX) 0.25 MG tablet TAKE 1 TO 2 TABLETS BY MOUTH DAILY AS NEEDED 30 tablet 2  . famotidine (PEPCID) 20 MG tablet Take 20 mg by mouth daily.    . flecainide (TAMBOCOR) 50 MG tablet Take 1 tablet (50 mg total) by mouth 2 (two) times daily. 60 tablet 3  . FLUoxetine (PROZAC) 20 MG capsule  TAKE 1 CAPSULE(20 MG) BY MOUTH DAILY 90 capsule 1  . fluticasone (FLONASE) 50 MCG/ACT nasal spray Place 2 sprays into both nostrils daily.    . fluticasone furoate-vilanterol (BREO ELLIPTA) 200-25 MCG/INH AEPB INHALE 1 PUFF INTO THE LUNGS DAILY 180 each 3  . levocetirizine (XYZAL) 5 MG tablet Take 5 mg by mouth every evening.    Marland Kitchen lisinopril-hydrochlorothiazide (ZESTORETIC) 20-12.5 MG tablet Take 2 tablets by mouth daily. (Patient taking differently: Take 1.5 tablets by mouth daily.) 180 tablet 2  . metoprolol succinate (TOPROL-XL) 50 MG 24 hr tablet Take  0.5 tablets (25 mg total) by mouth daily. Take with or immediately following a meal. 90 tablet 3  . montelukast (SINGULAIR) 10 MG tablet Take 1 tablet (10 mg total) by mouth at bedtime. 90 tablet 3  . Multiple Vitamin (MULTIVITAMIN) tablet Take 1 tablet by mouth daily.    . Multiple Vitamins-Minerals (VISION FORMULA PO) Take by mouth daily.     Marland Kitchen PROAIR RESPICLICK 676 (90 Base) MCG/ACT AEPB INHALE 2 PUFFS INTO THE LUNGS EVERY 6 HOURS AS NEEDED 1 each 2  . Probiotic Product (ALIGN) 4 MG CAPS Take by mouth daily.    . simvastatin (ZOCOR) 10 MG tablet Take 1 tablet (10 mg total) by mouth at bedtime. 90 tablet 1  . triamcinolone cream (KENALOG) 0.1 % Apply 1 application topically 2 (two) times daily. 453.6 g 1  . XARELTO 20 MG TABS tablet TAKE 1 TABLET(20 MG) BY MOUTH DAILY WITH SUPPER 90 tablet 3   No current facility-administered medications for this encounter.    No Known Allergies  Social History   Socioeconomic History  . Marital status: Divorced    Spouse name: Not on file  . Number of children: 3  . Years of education: 77  . Highest education level: Not on file  Occupational History  . Occupation: Medical sales representative   Tobacco Use  . Smoking status: Former Smoker    Packs/day: 0.25    Years: 10.00    Pack years: 2.50    Types: Cigarettes  . Smokeless tobacco: Never Used  . Tobacco comment: 35 years ago; smoke socially  Vaping Use  . Vaping Use: Never used  Substance and Sexual Activity  . Alcohol use: No    Alcohol/week: 0.0 standard drinks  . Drug use: No  . Sexual activity: Not on file  Other Topics Concern  . Not on file  Social History Narrative   Separated    Three children ( one lives locally)    Social Determinants of Health   Financial Resource Strain: Not on file  Food Insecurity: Not on file  Transportation Needs: Not on file  Physical Activity: Not on file  Stress: Not on file  Social Connections: Not on file  Intimate Partner Violence: Not on file      ROS- All systems are reviewed and negative except as per the HPI above.  Physical Exam: Vitals:   07/25/20 0944  BP: 106/74  Pulse: (!) 57  Weight: 80.4 kg  Height: 5\' 6"  (1.676 m)    GEN- The patient is well appearing, alert and oriented x 3 today.   HEENT-head normocephalic, atraumatic, sclera clear, conjunctiva pink, hearing intact, trachea midline. Lungs- Clear to ausculation bilaterally, normal work of breathing Heart- Regular rate and rhythm, no murmurs, rubs or gallops  GI- soft, NT, ND, + BS Extremities- no clubbing, cyanosis, or edema MS- no significant deformity or atrophy Skin- no rash or lesion Psych- euthymic mood, full affect  Neuro- strength and sensation are intact   Wt Readings from Last 3 Encounters:  07/25/20 80.4 kg  05/13/20 78.9 kg  04/17/20 81.3 kg    EKG today demonstrates  SB  Vent. rate 57 BPM PR interval 182 ms QRS duration 90 ms QT/QTc 484/471 ms  Echo 04/08/20 demonstrated 1. Left ventricular ejection fraction, by estimation, is 50 to 55%. The  left ventricle has low normal function. The left ventricle has no regional  wall motion abnormalities. Left ventricular diastolic parameters are  indeterminate.  2. Right ventricular systolic function is normal. The right ventricular  size is normal. There is normal pulmonary artery systolic pressure.  3. The mitral valve is normal in structure. Mild to moderate mitral valve  regurgitation. No evidence of mitral stenosis.  4. The aortic valve is normal in structure. Aortic valve regurgitation is  mild to moderate. No aortic stenosis is present.  5. Aortic dilatation noted. There is mild to moderate dilatation of the  ascending aorta, measuring 44 mm.   Epic records are reviewed at length today  CHA2DS2-VASc Score = 3  The patient's score is based upon: CHF History: No HTN History: Yes Diabetes History: No Stroke History: No Vascular Disease History: No Age Score: 1 Gender Score:  1      ASSESSMENT AND PLAN: 1. Paroxysmal Atrial Fibrillation (ICD10:  I48.0) The patient's CHA2DS2-VASc score is 3, indicating a 3.2% annual risk of stroke.   Patient has had infrequent episodes. We discussed increasing flecainide today. She would like to continue her present therapy and monitor for now. Continue flecainide 50 mg BID Continue Toprol 25 mg daily Continue Xarelto 20 mg daily  2. Secondary Hypercoagulable State (ICD10:  D68.69) The patient is at significant risk for stroke/thromboembolism based upon her CHA2DS2-VASc Score of 3.  Continue Rivaroxaban (Xarelto).   3. HTN Stable, no changes today.    Follow up with Dr Johney Frame as scheduled. AF clinic in 9 months, sooner if AAD needs to be titrated.    [facility] [address] [place], [place] [address] [phone] 07/25/2020 10:05 AM

## 2020-07-25 ENCOUNTER — Ambulatory Visit (HOSPITAL_COMMUNITY)
Admission: RE | Admit: 2020-07-25 | Discharge: 2020-07-25 | Disposition: A | Discharge status: Home / Self Care | Payer: Medicare Other | Source: Ambulatory Visit | Attending: Physician Assistant | Admitting: Physician Assistant

## 2020-07-25 ENCOUNTER — Encounter (HOSPITAL_COMMUNITY): Payer: Self-pay | Admitting: Physician Assistant

## 2020-07-25 ENCOUNTER — Other Ambulatory Visit: Payer: Self-pay

## 2020-07-25 VITALS — BP 106/74 | HR 57 | Ht 66.0 in | Wt 177.2 lb

## 2020-07-25 DIAGNOSIS — Z79899 Other long term (current) drug therapy: Secondary | ICD-10-CM | POA: Insufficient documentation

## 2020-07-25 DIAGNOSIS — Z7901 Long term (current) use of anticoagulants: Secondary | ICD-10-CM | POA: Diagnosis not present

## 2020-07-25 DIAGNOSIS — I10 Essential (primary) hypertension: Secondary | ICD-10-CM | POA: Diagnosis not present

## 2020-07-25 DIAGNOSIS — J45909 Unspecified asthma, uncomplicated: Secondary | ICD-10-CM | POA: Diagnosis not present

## 2020-07-25 DIAGNOSIS — Z87891 Personal history of nicotine dependence: Secondary | ICD-10-CM | POA: Insufficient documentation

## 2020-07-25 DIAGNOSIS — Z7951 Long term (current) use of inhaled steroids: Secondary | ICD-10-CM | POA: Insufficient documentation

## 2020-07-25 DIAGNOSIS — D6869 Other thrombophilia: Secondary | ICD-10-CM | POA: Diagnosis not present

## 2020-07-25 DIAGNOSIS — I480 Paroxysmal atrial fibrillation: Secondary | ICD-10-CM | POA: Insufficient documentation

## 2020-08-01 ENCOUNTER — Telehealth: Payer: Self-pay | Admitting: Adult Health

## 2020-08-01 NOTE — Progress Notes (Signed)
  Chronic Care Management   Outreach Note  08/01/2020 Name: Glenda Bauer MRN: [id number] DOB: 02/12/1951  Referred by: [name], NP Reason for referral : No chief complaint on file.   A second unsuccessful telephone outreach was attempted today. The patient was referred to pharmacist for assistance with care management and care coordination.  Follow Up Plan:   Carley Perdue [employer] Scheduler

## 2020-08-08 ENCOUNTER — Telehealth: Payer: Self-pay | Admitting: Adult Health

## 2020-08-08 ENCOUNTER — Other Ambulatory Visit (HOSPITAL_COMMUNITY): Payer: Self-pay | Admitting: Physician Assistant

## 2020-08-08 NOTE — Progress Notes (Signed)
  Chronic Care Management   Outreach Note  08/08/2020 Name: Glenda Bauer MRN: [id number] DOB: Nov 03, 1950  Referred by: [name], NP Reason for referral : No chief complaint on file.   A second unsuccessful telephone outreach was attempted today. The patient was referred to pharmacist for assistance with care management and care coordination.  Follow Up Plan:   Carley Perdue [employer] Scheduler

## 2020-08-14 ENCOUNTER — Telehealth: Payer: Self-pay | Admitting: Adult Health

## 2020-08-14 NOTE — Progress Notes (Signed)
  Chronic Care Management   Outreach Note  08/14/2020 Name: Glenda Bauer MRN: [id number] DOB: 02-13-1951  Referred by: [name], NP Reason for referral : No chief complaint on file.   Third unsuccessful telephone outreach was attempted today. The patient was referred to the pharmacist for assistance with care management and care coordination.   Follow Up Plan:   Carley Perdue [employer] Scheduler

## 2020-08-15 ENCOUNTER — Telehealth: Payer: Self-pay | Admitting: Adult Health

## 2020-08-15 NOTE — Progress Notes (Signed)
  Chronic Care Management   Note  08/15/2020 Name: Alycia Cooperwood MRN: [id number] DOB: 17-Nov-1950  Zya Finkle is a 70 y.o. year old female who is a primary care patient of [name], NP. I reached out to [name] by phone today in response to a referral sent by Ms. Rosezena Sensor PCP, [name], NP.   Ms. Garzon was given information about Chronic Care Management services today including:  1. CCM service includes personalized support from designated clinical staff supervised by her physician, including individualized plan of care and coordination with other care providers 2. 24/7 contact phone numbers for assistance for urgent and routine care needs. 3. Service will only be billed when office clinical staff spend 20 minutes or more in a month to coordinate care. 4. Only one practitioner may furnish and bill the service in a calendar month. 5. The patient may stop CCM services at any time (effective at the end of the month) by phone call to the office staff.   Patient agreed to services and verbal consent obtained.   Follow up plan:   Carley Perdue [employer] Scheduler

## 2020-08-25 ENCOUNTER — Telehealth: Payer: Self-pay | Admitting: Adult Health

## 2020-08-25 ENCOUNTER — Other Ambulatory Visit: Payer: Self-pay | Admitting: Adult Health

## 2020-08-25 DIAGNOSIS — Z1231 Encounter for screening mammogram for malignant neoplasm of breast: Secondary | ICD-10-CM

## 2020-08-25 NOTE — Telephone Encounter (Signed)
Pt is calling in needing a referral to a OB-GYN (boil or cyst in private area).

## 2020-08-26 ENCOUNTER — Other Ambulatory Visit: Payer: Self-pay | Admitting: Adult Health

## 2020-08-26 DIAGNOSIS — L0291 Cutaneous abscess, unspecified: Secondary | ICD-10-CM

## 2020-08-26 NOTE — Telephone Encounter (Signed)
LVM explaining message

## 2020-08-26 NOTE — Telephone Encounter (Signed)
Referral placed. Would be happy to take a look for her or urgent care can handle this as well so she does not have to wait for GYN

## 2020-08-30 ENCOUNTER — Ambulatory Visit
Admission: RE | Admit: 2020-08-30 | Discharge: 2020-08-30 | Disposition: A | Discharge status: Home / Self Care | Payer: Medicare Other | Source: Ambulatory Visit

## 2020-08-30 DIAGNOSIS — Z1231 Encounter for screening mammogram for malignant neoplasm of breast: Secondary | ICD-10-CM

## 2020-08-30 IMAGING — MG MM DIGITAL SCREENING BILAT W/ TOMO AND CAD
8 series · 8 of 24 positions shown · non-contrast
Comparison: Previous exam(s).

CLINICAL DATA: Screening.

EXAM:
DIGITAL SCREENING BILATERAL MAMMOGRAM WITH TOMOSYNTHESIS AND CAD
TECHNIQUE: Bilateral screening digital craniocaudal and mediolateral oblique
mammograms were obtained. Bilateral screening digital breast
tomosynthesis was performed. The images were evaluated with
computer-aided detection.

[L CC synth-2D]
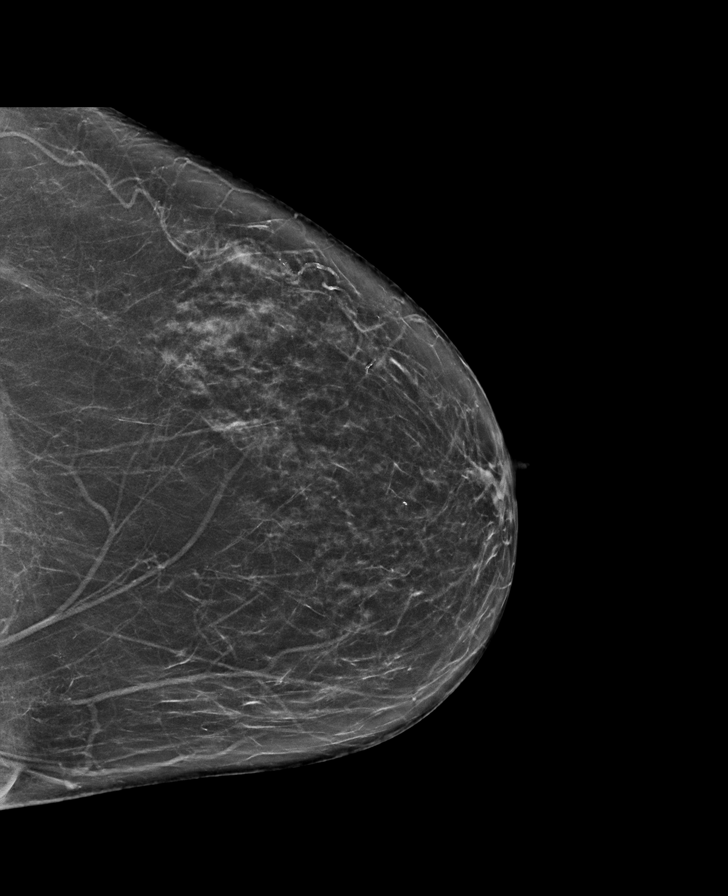

[R CC synth-2D]
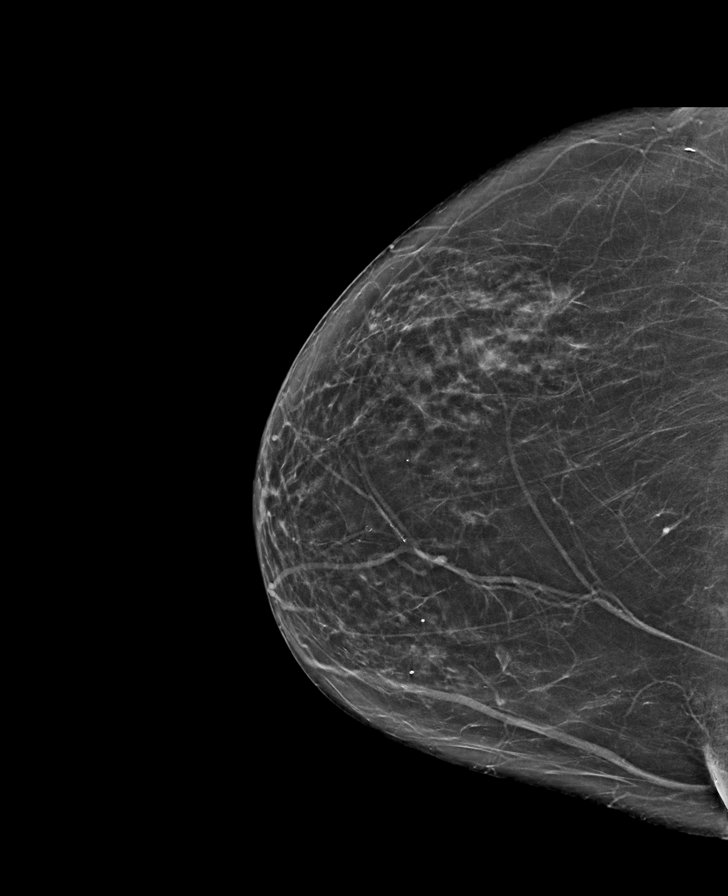

[L MLO synth-2D]
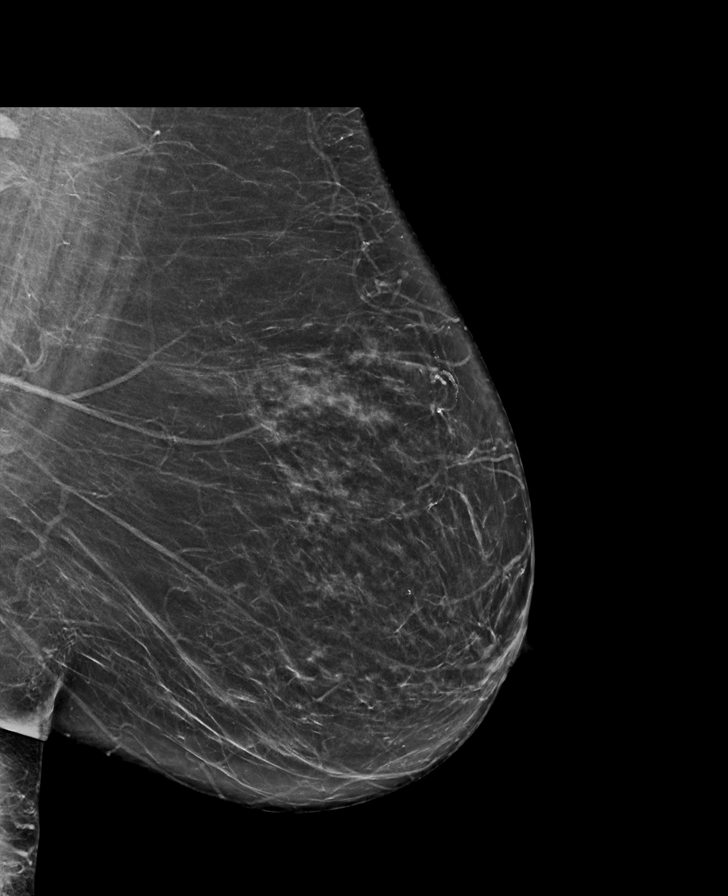

[R MLO synth-2D]
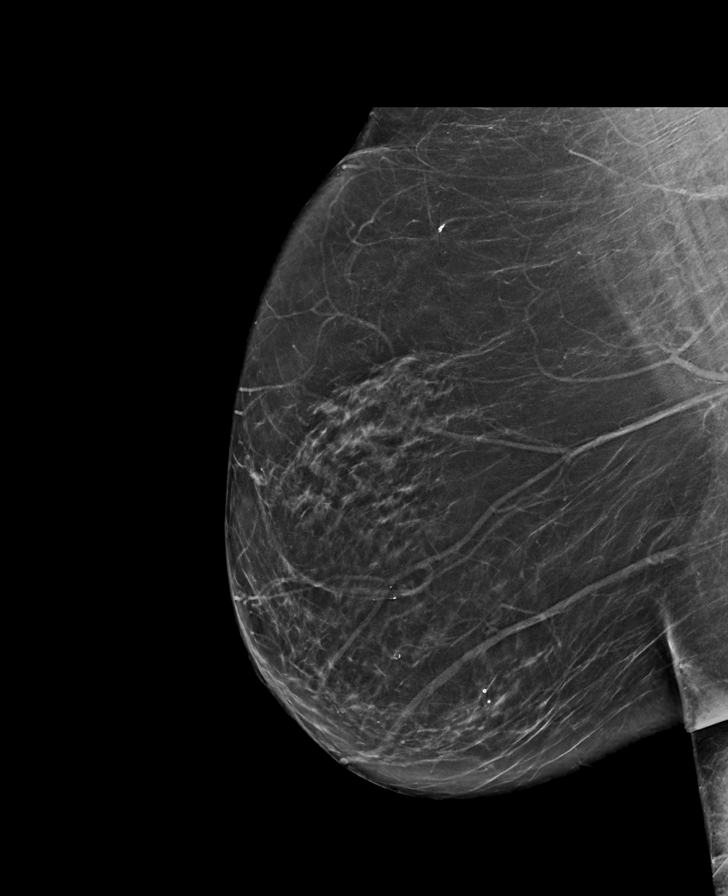

[L CC tomo · tomo slice 36/71.0]
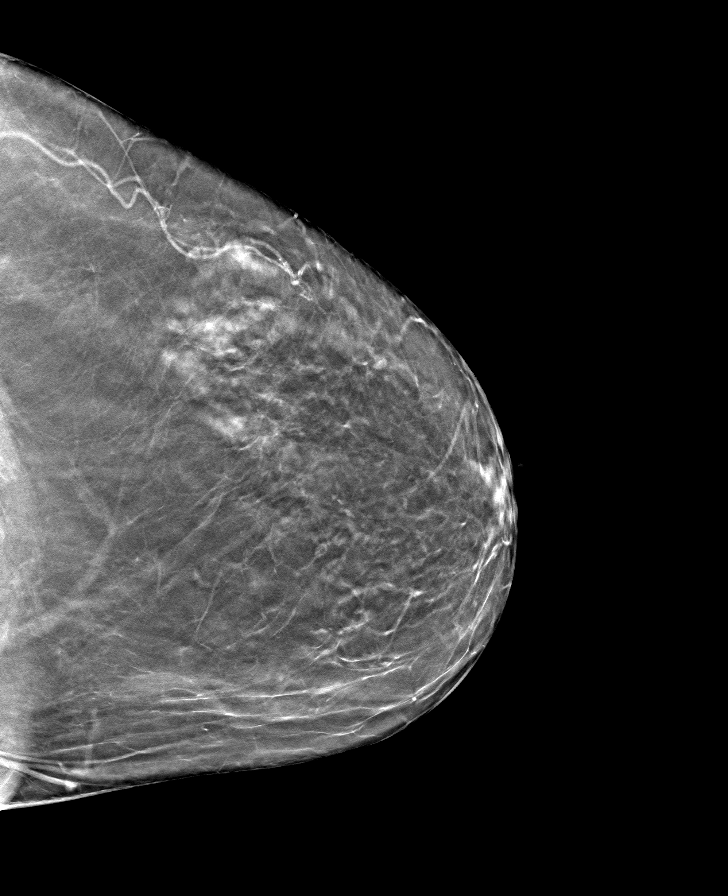

[L MLO tomo · tomo slice 37/73.0]
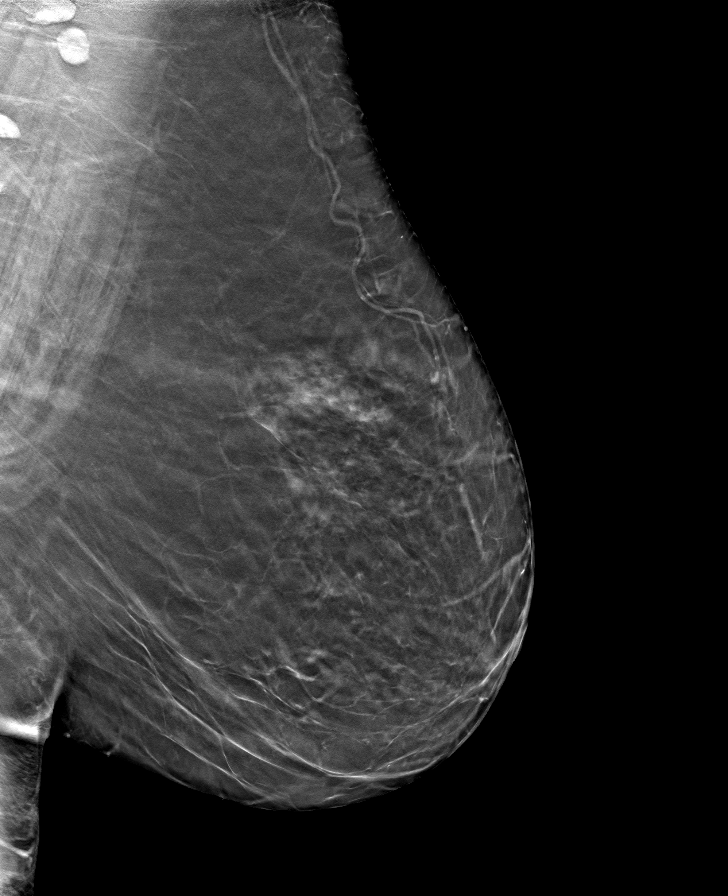

[R MLO tomo · tomo slice 37/72.0]
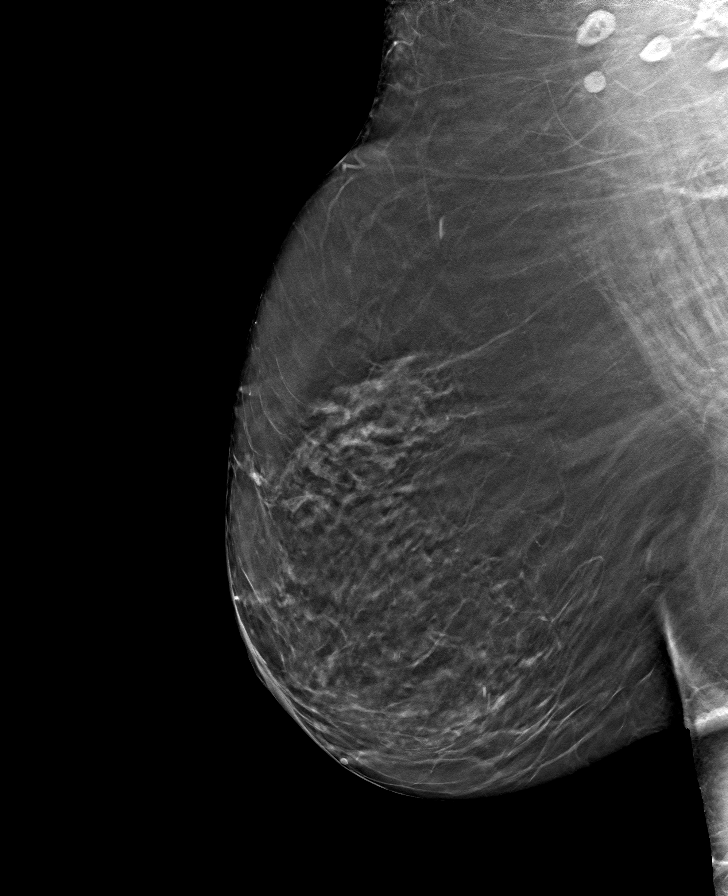

[R CC tomo · tomo slice 33/66.0]
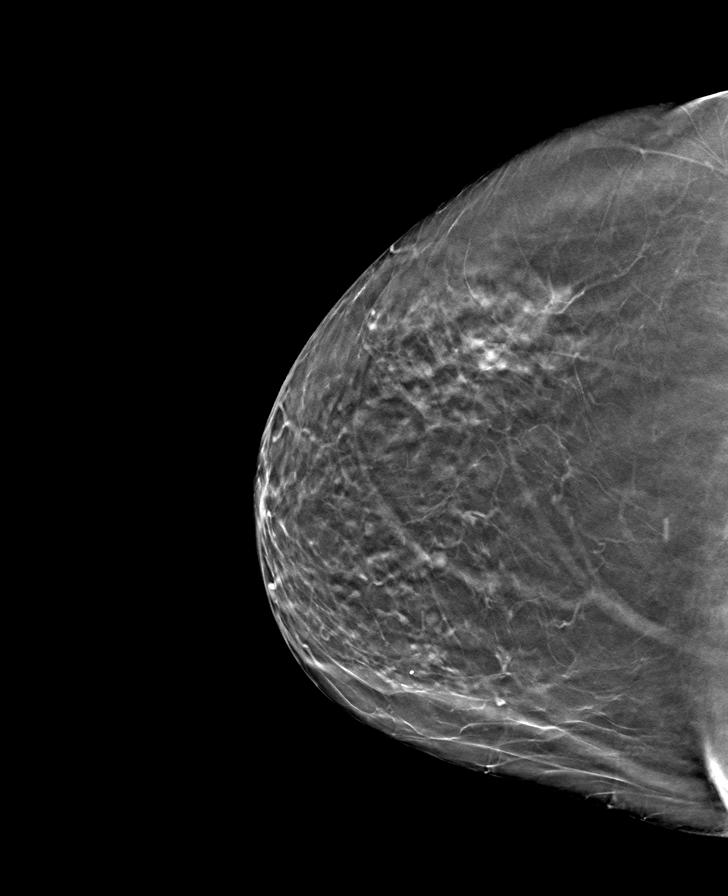

[8 of 24 positions shown; findings below may reference images not displayed]

ACR Breast Density Category b: There are scattered areas of
fibroglandular density.
FINDINGS: There are no findings suspicious for malignancy. The images were
evaluated with computer-aided detection.
IMPRESSION: No mammographic evidence of malignancy. A result letter of this
screening mammogram will be mailed directly to the patient.

RECOMMENDATION:
Screening mammogram in one year. (Code:[OD])

BI-RADS CATEGORY  1: Negative.

## 2020-09-15 ENCOUNTER — Ambulatory Visit: Payer: Medicare Other

## 2020-09-25 ENCOUNTER — Other Ambulatory Visit: Payer: Self-pay | Admitting: Adult Health

## 2020-09-29 ENCOUNTER — Ambulatory Visit (INDEPENDENT_AMBULATORY_CARE_PROVIDER_SITE_OTHER): Payer: Medicare Other

## 2020-09-29 DIAGNOSIS — Z79899 Other long term (current) drug therapy: Secondary | ICD-10-CM

## 2020-09-29 DIAGNOSIS — Z0000 Encounter for general adult medical examination without abnormal findings: Secondary | ICD-10-CM | POA: Diagnosis not present

## 2020-09-29 NOTE — Progress Notes (Signed)
Subjective:   Glenda Bauer is a 70 y.o. female who presents for Medicare Annual (Subsequent) preventive examination.  Virtual Visit via Video Note  I connected with Glenda Bauer by a video enabled telemedicine application and verified that I am speaking with the correct person using two identifiers.  Location: Patient: Home Provider: Office Persons participating in the virtual visit: patient, provider   I discussed the limitations of evaluation and management by telemedicine and the availability of in person appointments. The patient expressed understanding and agreed to proceed.     Randel Pigg ,LPN    Review of Systems    n/a     Objective:    Today's Vitals   There is no height or weight on file to calculate BMI.  Advanced Directives 11/14/2014  Does Patient Have a Medical Advance Directive? No    Current Medications (verified) Outpatient Encounter Medications as of 09/29/2020  Medication Sig  . albuterol (PROVENTIL) (2.5 MG/3ML) 0.083% nebulizer solution USE 1 VIAL VIA NEBULIZER EVERY 6 HOURS AS NEEDED FOR WHEEZING OR SHORTNESS OF BREATH  . ALPRAZolam (XANAX) 0.25 MG tablet TAKE 1 TO 2 TABLETS BY MOUTH DAILY AS NEEDED  . famotidine (PEPCID) 20 MG tablet Take 20 mg by mouth daily.  Marland Kitchen FLUoxetine (PROZAC) 20 MG capsule TAKE 1 CAPSULE(20 MG) BY MOUTH DAILY  . fluticasone (FLONASE) 50 MCG/ACT nasal spray Place 2 sprays into both nostrils daily.  . fluticasone furoate-vilanterol (BREO ELLIPTA) 200-25 MCG/INH AEPB INHALE 1 PUFF INTO THE LUNGS DAILY  . levocetirizine (XYZAL) 5 MG tablet Take 5 mg by mouth every evening.  Marland Kitchen lisinopril-hydrochlorothiazide (ZESTORETIC) 20-12.5 MG tablet Take 2 tablets by mouth daily. (Patient taking differently: Take 1.5 tablets by mouth daily.)  . metoprolol succinate (TOPROL-XL) 50 MG 24 hr tablet Take 0.5 tablets (25 mg total) by mouth daily. Take with or immediately following a meal.  . montelukast (SINGULAIR) 10 MG tablet  Take 1 tablet (10 mg total) by mouth at bedtime.  . Multiple Vitamin (MULTIVITAMIN) tablet Take 1 tablet by mouth daily.  . Multiple Vitamins-Minerals (VISION FORMULA PO) Take by mouth daily.   Marland Kitchen PROAIR RESPICLICK 315 (90 Base) MCG/ACT AEPB INHALE 2 PUFFS INTO THE LUNGS EVERY 6 HOURS AS NEEDED  . Probiotic Product (ALIGN) 4 MG CAPS Take by mouth daily.  . simvastatin (ZOCOR) 10 MG tablet TAKE 1 TABLET(10 MG) BY MOUTH AT BEDTIME  . triamcinolone cream (KENALOG) 0.1 % Apply 1 application topically 2 (two) times daily.  [name] 20 MG TABS tablet TAKE 1 TABLET(20 MG) BY MOUTH DAILY WITH SUPPER  . [DISCONTINUED] flecainide (TAMBOCOR) 50 MG tablet TAKE 1 TABLET(50 MG) BY MOUTH TWICE DAILY   No facility-administered encounter medications on file as of 09/29/2020.    Allergies (verified) Patient has no known allergies.   History: Past Medical History:  Diagnosis Date  . Allergy   . Anxiety   . Asthma   . Cancer (HCC)    Melanoma and Mycosis Fungosis  . Cataract   . 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 Hipolito Bayley  . Depression   . GERD (gastroesophageal reflux disease)   . Heart murmur   . Hypertension   . Neuromuscular disorder ([place])   . Osteoarthritis   . Thyroid nodule   . Urinary tract infection    Past Surgical History:  Procedure Laterality Date  . Cataracts Bilateral   . CHOLECYSTECTOMY  2015  . melenoma removal  1979  . Sinus Polyps  Family History  Problem Relation Age of Onset  . Arthritis Mother   . Ovarian cancer Mother   . Breast cancer Mother   . Parkinson's disease Father   . Crohn's disease Brother   . Schizophrenia Brother   . Colon cancer Neg Hx   . Esophageal cancer Neg Hx   . Rectal cancer Neg Hx   . Stomach cancer Neg Hx    Social History   Socioeconomic History  . Marital status: Divorced    Spouse name: Not on file  . Number of children: 3  . Years of education: 39  . Highest education level:  Not on file  Occupational History  . Occupation: Medical sales representative   Tobacco Use  . Smoking status: Former Smoker    Packs/day: 0.25    Years: 10.00    Pack years: 2.50    Types: Cigarettes  . Smokeless tobacco: Never Used  . Tobacco comment: 35 years ago; smoke socially  Vaping Use  . Vaping Use: Never used  Substance and Sexual Activity  . Alcohol use: No    Alcohol/week: 0.0 standard drinks  . Drug use: No  . Sexual activity: Not on file  Other Topics Concern  . Not on file  Social History Narrative   Separated    Three children ( one lives locally)    Social Determinants of Health   Financial Resource Strain: Not on file  Food Insecurity: No Food Insecurity  . Worried About Charity fundraiser in the Last Year: Never true  . Ran Out of Food in the Last Year: Never true  Transportation Needs: No Transportation Needs  . Lack of Transportation (Medical): No  . Lack of Transportation (Non-Medical): No  Physical Activity: Sufficiently Active  . Days of Exercise per Week: 7 days  . Minutes of Exercise per Session: 50 min  Stress: No Stress Concern Present  . Feeling of Stress : Not at all  Social Connections: Moderately Isolated  . Frequency of Communication with Friends and Family: More than three times a week  . Frequency of Social Gatherings with Friends and Family: More than three times a week  . Attends Religious Services: Never  . Active Member of Clubs or Organizations: No  . Attends Archivist Meetings: 1 to 4 times per year  . Marital Status: Divorced    Tobacco Counseling Counseling given: Not Answered Comment: 35 years ago; smoke socially   Clinical Intake:  Pre-visit preparation completed: Yes  Pain : No/denies pain     Nutritional Risks: None Diabetes: No  How often do you need to have someone help you when you read instructions, pamphlets, or other written materials from your doctor or pharmacy?: 1 - Never What is the last grade level you  completed in school?: college  Diabetic?no  Interpreter Needed?: No  Information entered by :: l.Atianna Haidar,LPn   Activities of Daily Living In your present state of health, do you have any difficulty performing the following activities: 09/29/2020  Hearing? N  Vision? N  Difficulty concentrating or making decisions? N  Walking or climbing stairs? N  Dressing or bathing? N  Doing errands, shopping? N  Preparing Food and eating ? N  Using the Toilet? N  In the past six months, have you accidently leaked urine? N  Do you have problems with loss of bowel control? N  Managing your Medications? N  Managing your Finances? N  Housekeeping or managing your Housekeeping? N  Some recent data might  be hidden    Patient Care Team: [name], NP as PCP - General (Family Medicine) [name], MD as PCP - Cardiology (Cardiology) Viona Gilmore, [facility] as Pharmacist (Pharmacist)  Indicate any recent Medical Services you may have received from other than [facility] providers in the past year (date may be approximate).     Assessment:   This is a routine wellness examination for Vollie.  Hearing/Vision screen  Hearing Screening   125Hz  250Hz  500Hz  1000Hz  2000Hz  3000Hz  4000Hz  6000Hz  8000Hz   Right ear:           Left ear:           Comments: Annual eye exams    Dietary issues and exercise activities discussed: Current Exercise Habits: Home exercise routine, Type of exercise: walking, Time (Minutes): 30, Frequency (Times/Week): 6, Weekly Exercise (Minutes/Week): 180, Intensity: Mild, Exercise limited by: None identified  Goals    . Exercise 3x per week (30 min per time)    . Weight < 160 lb (72.576 kg)     Goal is to start walking again up to 5 miles;       Depression Screen PHQ 2/9 Scores 09/29/2020 09/29/2020 03/27/2020 06/22/2017 11/14/2014 08/06/2014  PHQ - 2 Score 0 0 0 0 0 2  PHQ- 9 Score - - - - - 3    Fall Risk Fall Risk  09/29/2020 03/27/2020 06/22/2017 11/14/2014 08/06/2014   Falls in the past year? 0 0 No No No  Number falls in past yr: 0 0 - - -  Injury with Fall? 0 - - - -  Follow up Falls evaluation completed - - - -    FALL RISK PREVENTION PERTAINING TO THE HOME:  Any stairs in or around the home? Yes  If so, are there any without handrails? Yes  Home free of loose throw rugs in walkways, pet beds, electrical cords, etc? Yes  Adequate lighting in your home to reduce risk of falls? Yes   ASSISTIVE DEVICES UTILIZED TO PREVENT FALLS:  Life alert? Yes  Use of a cane, walker or w/c? Yes  Grab bars in the bathroom? Yes  Shower chair or bench in shower? Yes  Elevated toilet seat or a handicapped toilet? Yes    Cognitive Function: Normal cognitive status assessed by direct observation by this Nurse Health Advisor. No abnormalities found.   MMSE - Mini Mental State Exam 11/14/2014  Not completed: Unable to complete        Immunizations Immunization History  Administered Date(s) Administered  . Fluad Quad(high Dose 65+) 03/27/2020  . Influenza, High Dose Seasonal PF 02/13/2016, 06/21/2018, 02/15/2019  . Influenza,inj,Quad PF,6+ Mos 05/21/2014  . PFIZER(Purple Top)SARS-COV-2 Vaccination 08/06/2019, 09/04/2019, 04/05/2020    TDAP status: Up to date  Flu Vaccine status: Up to date  Pneumococcal vaccine status: Due, Education has been provided regarding the importance of this vaccine. Advised may receive this vaccine at local pharmacy or Health Dept. Aware to provide a copy of the vaccination record if obtained from local pharmacy or Health Dept. Verbalized acceptance and understanding.  Covid-19 vaccine status: Declined, Education has been provided regarding the importance of this vaccine but patient still declined. Advised may receive this vaccine at local pharmacy or Health Dept.or vaccine clinic. Aware to provide a copy of the vaccination record if obtained from local pharmacy or Health Dept. Verbalized acceptance and understanding.  Qualifies  for Shingles Vaccine? Yes   Zostavax completed No   Shingrix Completed?: No.    Education  has been provided regarding the importance of this vaccine. Patient has been advised to call insurance company to determine out of pocket expense if they have not yet received this vaccine. Advised may also receive vaccine at local pharmacy or Health Dept. Verbalized acceptance and understanding.  Screening Tests Health Maintenance  Topic Date Due  . TETANUS/TDAP  Never done  . PNA vac Low Risk Adult (1 of 2 - PCV13) Never done  . COVID-19 Vaccine (4 - Booster for Pfizer series) 10/04/2020  . INFLUENZA VACCINE  01/05/2021  . MAMMOGRAM  08/30/2021  . DEXA SCAN  Completed  . Hepatitis C Screening  Completed  . HPV VACCINES  Aged Out    Health Maintenance  Health Maintenance Due  Topic Date Due  . TETANUS/TDAP  Never done  . PNA vac Low Risk Adult (1 of 2 - PCV13) Never done    Colorectal cancer screening: Type of screening: Colonoscopy. Completed 04/19/2019. Repeat every 5 years  Mammogram status: Completed 08/30/2020. Repeat every year  Bone Density status: Completed 11/26/2014. Results reflect: Bone density results: OSTEOPENIA. Repeat every 10 years.  Lung Cancer Screening: (Low Dose CT Chest recommended if Age 12-80 years, 30 pack-year currently smoking OR have quit w/in 15years.) does not qualify.   Lung Cancer Screening Referral: n/a  Additional Screening:  Hepatitis C Screening: does qualify; Completed 02/06/2019  Vision Screening: Recommended annual ophthalmology exams for early detection of glaucoma and other disorders of the eye. Is the patient up to date with their annual eye exam?  Yes  Who is the provider or what is the name of the office in which the patient attends annual eye exams? [facility]  If pt is not established with a provider, would they like to be referred to a provider to establish care? No .   Dental Screening: Recommended annual dental exams for  proper oral hygiene  Community Resource Referral / Chronic Care Management: CRR required this visit?  Yes   CCM required this visit?  No      Plan:     I have personally reviewed and noted the following in the patient's chart:   . Medical and social history . Use of alcohol, tobacco or illicit drugs  . Current medications and supplements . Functional ability and status . Nutritional status . Physical activity . Advanced directives . List of other physicians . Hospitalizations, surgeries, and ER visits in previous 12 months . Vitals . Screenings to include cognitive, depression, and falls . Referrals and appointments  In addition, I have reviewed and discussed with patient certain preventive protocols, quality metrics, and best practice recommendations. A written personalized care plan for preventive services as well as general preventive health recommendations were provided to patient.     Randel Pigg, LPN   02/19/7828   Nurse Notes:  Pt having difficulty paying for medications placing referral for pharmacy consultation.

## 2020-09-29 NOTE — Patient Instructions (Signed)
Glenda Bauer , Thank you for taking time to come for your Medicare Wellness Visit. I appreciate your ongoing commitment to your health goals. Please review the following plan we discussed and let me know if I can assist you in the future.   Screening recommendations/referrals: Colonoscopy: Current Due 04/18/2024 Mammogram: current due 08/30/20 Bone Density: 11/26/2014 Recommended yearly ophthalmology/optometry visit for glaucoma screening and checkup Recommended yearly dental visit for hygiene and checkup  Vaccinations: Influenza vaccine: current due fall 2022 Pneumococcal vaccine: completed series Tdap vaccine: due upon injury  Shingles vaccine: with will obtain local pharmacy    Advanced directives: will provided copies   Conditions/risks identified: Patient having difficulties paying for medications   Next appointment: 10/15/2020  330pm  Pharmacy    Preventive Care 21 Years and Older, Female Preventive care refers to lifestyle choices and visits with your health care provider that can promote health and wellness. What does preventive care include?  A yearly physical exam. This is also called an annual well check.  Dental exams once or twice a year.  Routine eye exams. Ask your health care provider how often you should have your eyes checked.  Personal lifestyle choices, including:  Daily care of your teeth and gums.  Regular physical activity.  Eating a healthy diet.  Avoiding tobacco and drug use.  Limiting alcohol use.  Practicing safe sex.  Taking low-dose aspirin every day.  Taking vitamin and mineral supplements as recommended by your health care provider. What happens during an annual well check? The services and screenings done by your health care provider during your annual well check will depend on your age, overall health, lifestyle risk factors, and family history of disease. Counseling  Your health care provider may ask you questions about  your:  Alcohol use.  Tobacco use.  Drug use.  Emotional well-being.  Home and relationship well-being.  Sexual activity.  Eating habits.  History of falls.  Memory and ability to understand (cognition).  Work and work Statistician.  Reproductive health. Screening  You may have the following tests or measurements:  Height, weight, and BMI.  Blood pressure.  Lipid and cholesterol levels. These may be checked every 5 years, or more frequently if you are over 84 years old.  Skin check.  Lung cancer screening. You may have this screening every year starting at age 25 if you have a 30-pack-year history of smoking and currently smoke or have quit within the past 15 years.  Fecal occult blood test (FOBT) of the stool. You may have this test every year starting at age 69.  Flexible sigmoidoscopy or colonoscopy. You may have a sigmoidoscopy every 5 years or a colonoscopy every 10 years starting at age 68.  Hepatitis C blood test.  Hepatitis B blood test.  Sexually transmitted disease (STD) testing.  Diabetes screening. This is done by checking your blood sugar (glucose) after you have not eaten for a while (fasting). You may have this done every 1-3 years.  Bone density scan. This is done to screen for osteoporosis. You may have this done starting at age 73.  Mammogram. This may be done every 1-2 years. Talk to your health care provider about how often you should have regular mammograms. Talk with your health care provider about your test results, treatment options, and if necessary, the need for more tests. Vaccines  Your health care provider may recommend certain vaccines, such as:  Influenza vaccine. This is recommended every year.  Tetanus, diphtheria, and acellular pertussis (  Tdap, Td) vaccine. You may need a Td booster every 10 years.  Zoster vaccine. You may need this after age 28.  Pneumococcal 13-valent conjugate (PCV13) vaccine. One dose is recommended  after age 70.  Pneumococcal polysaccharide (PPSV23) vaccine. One dose is recommended after age 53. Talk to your health care provider about which screenings and vaccines you need and how often you need them. This information is not intended to replace advice given to you by your health care provider. Make sure you discuss any questions you have with your health care provider. Document Released: 06/20/2015 Document Revised: 02/11/2016 Document Reviewed: 03/25/2015 Elsevier Interactive Patient Education  2017 [place] Prevention in the Home Falls can cause injuries. They can happen to people of all ages. There are many things you can do to make your home safe and to help prevent falls. What can I do on the outside of my home?  Regularly fix the edges of walkways and driveways and fix any cracks.  Remove anything that might make you trip as you walk through a door, such as a raised step or threshold.  Trim any bushes or trees on the path to your home.  Use bright outdoor lighting.  Clear any walking paths of anything that might make someone trip, such as rocks or tools.  Regularly check to see if handrails are loose or broken. Make sure that both sides of any steps have handrails.  Any raised decks and porches should have guardrails on the edges.  Have any leaves, snow, or ice cleared regularly.  Use sand or salt on walking paths during winter.  Clean up any spills in your garage right away. This includes oil or grease spills. What can I do in the bathroom?  Use night lights.  Install grab bars by the toilet and in the tub and shower. Do not use towel bars as grab bars.  Use non-skid mats or decals in the tub or shower.  If you need to sit down in the shower, use a plastic, non-slip stool.  Keep the floor dry. Clean up any water that spills on the floor as soon as it happens.  Remove soap buildup in the tub or shower regularly.  Attach bath mats securely with  double-sided non-slip rug tape.  Do not have throw rugs and other things on the floor that can make you trip. What can I do in the bedroom?  Use night lights.  Make sure that you have a light by your bed that is easy to reach.  Do not use any sheets or blankets that are too big for your bed. They should not hang down onto the floor.  Have a firm chair that has side arms. You can use this for support while you get dressed.  Do not have throw rugs and other things on the floor that can make you trip. What can I do in the kitchen?  Clean up any spills right away.  Avoid walking on wet floors.  Keep items that you use a lot in easy-to-reach places.  If you need to reach something above you, use a strong step stool that has a grab bar.  Keep electrical cords out of the way.  Do not use floor polish or wax that makes floors slippery. If you must use wax, use non-skid floor wax.  Do not have throw rugs and other things on the floor that can make you trip. What can I do with my stairs?  Do  not leave any items on the stairs.  Make sure that there are handrails on both sides of the stairs and use them. Fix handrails that are broken or loose. Make sure that handrails are as long as the stairways.  Check any carpeting to make sure that it is firmly attached to the stairs. Fix any carpet that is loose or worn.  Avoid having throw rugs at the top or bottom of the stairs. If you do have throw rugs, attach them to the floor with carpet tape.  Make sure that you have a light switch at the top of the stairs and the bottom of the stairs. If you do not have them, ask someone to add them for you. What else can I do to help prevent falls?  Wear shoes that:  Do not have high heels.  Have rubber bottoms.  Are comfortable and fit you well.  Are closed at the toe. Do not wear sandals.  If you use a stepladder:  Make sure that it is fully opened. Do not climb a closed stepladder.  Make  sure that both sides of the stepladder are locked into place.  Ask someone to hold it for you, if possible.  Clearly mark and make sure that you can see:  Any grab bars or handrails.  First and last steps.  Where the edge of each step is.  Use tools that help you move around (mobility aids) if they are needed. These include:  Canes.  Walkers.  Scooters.  Crutches.  Turn on the lights when you go into a dark area. Replace any light bulbs as soon as they burn out.  Set up your furniture so you have a clear path. Avoid moving your furniture around.  If any of your floors are uneven, fix them.  If there are any pets around you, be aware of where they are.  Review your medicines with your doctor. Some medicines can make you feel dizzy. This can increase your chance of falling. Ask your doctor what other things that you can do to help prevent falls. This information is not intended to replace advice given to you by your health care provider. Make sure you discuss any questions you have with your health care provider. Document Released: 03/20/2009 Document Revised: 10/30/2015 Document Reviewed: 06/28/2014 Elsevier Interactive Patient Education  2017 [employer].

## 2020-10-01 ENCOUNTER — Encounter: Payer: Self-pay | Admitting: Obstetrics and Gynecology

## 2020-10-01 ENCOUNTER — Other Ambulatory Visit: Payer: Self-pay

## 2020-10-01 ENCOUNTER — Other Ambulatory Visit (HOSPITAL_COMMUNITY)
Admission: RE | Admit: 2020-10-01 | Discharge: 2020-10-01 | Disposition: A | Discharge status: Home / Self Care | Payer: Medicare Other | Source: Ambulatory Visit | Attending: Obstetrics and Gynecology | Admitting: Obstetrics and Gynecology

## 2020-10-01 ENCOUNTER — Telehealth: Payer: Self-pay | Admitting: Obstetrics and Gynecology

## 2020-10-01 ENCOUNTER — Ambulatory Visit: Payer: Medicare Other | Admitting: Obstetrics and Gynecology

## 2020-10-01 VITALS — BP 118/72 | HR 58 | Ht 66.0 in | Wt 181.0 lb

## 2020-10-01 DIAGNOSIS — R87619 Unspecified abnormal cytological findings in specimens from cervix uteri: Secondary | ICD-10-CM | POA: Diagnosis not present

## 2020-10-01 DIAGNOSIS — N9089 Other specified noninflammatory disorders of vulva and perineum: Secondary | ICD-10-CM | POA: Insufficient documentation

## 2020-10-01 DIAGNOSIS — Z124 Encounter for screening for malignant neoplasm of cervix: Secondary | ICD-10-CM | POA: Insufficient documentation

## 2020-10-01 DIAGNOSIS — Z1151 Encounter for screening for human papillomavirus (HPV): Secondary | ICD-10-CM | POA: Diagnosis not present

## 2020-10-01 NOTE — Progress Notes (Signed)
GYNECOLOGY  VISIT   HPI: 70 y.o.   Divorced  Caucasian  female   [id number] with Patient's last menstrual period was 06/07/2000 (approximate).   here for cyst/bump on right vulvar area for several months. This is not painful but it does itch. The lump does go away and then returns. It does get large and smaller but never goes away. Takes Xyzal once a week to treat the itching. No draining.   Tries Monistat, vaginal, soaks, and antibiotic ointment to try to resolve this.   Hx of lymphoma of her skin.  Presented on her buttocks and looked like eczema.  She received radiation treatment and triamcinolone to treat the itching.   She denies vaginal bleeding.   Works two part time jobs.  Is from [place].   GYNECOLOGIC HISTORY: Patient's last menstrual period was 06/07/2000 (approximate). Contraception:  PMP Menopausal hormone therapy:  none Last mammogram: 08-30-20 Neg/BiRads1 Last pap smear: 7 years ago normal. Paps always normal        OB History    Gravida  3   Para  2   Term      Preterm      AB  1   Living  2     SAB  1   IAB      Ectopic      Multiple      Live Births                 Patient Active Problem List   Diagnosis Date Noted  . Paroxysmal atrial fibrillation ([place]) 04/03/2020  . Secondary hypercoagulable state ([place]) 04/03/2020  . Thyroid nodule   . Osteoarthritis   . Cyst of knee joint 02/13/2016  . Sinusitis 10/30/2015  . Asthma with exacerbation 10/30/2015  . Sciatica 03/06/2015  . Abdominal pain, right upper quadrant 02/12/2015  . 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 Essential hypertension 05/21/2014  . Anxiety and depression 05/21/2014  . Asthma, chronic 05/21/2014    Past Medical History:  Diagnosis Date  . Allergy   . Anxiety   . Asthma   . Atrial fibrillation ([place])   . Cancer (HCC)    Melanoma and Mycosis Fungosis  . Cataract   . Cutaneous T-cell lymphoma [facility]) 2011    Dermatologist treated in [place]; Dr. Hipolito Bayley  . Depression   . GERD (gastroesophageal reflux disease)   . Heart murmur   . Hypertension   . Neuromuscular disorder ([place])   . Osteoarthritis   . Thyroid nodule   . Urinary tract infection     Past Surgical History:  Procedure Laterality Date  . Cataracts Bilateral   . CHOLECYSTECTOMY  2015  . melenoma removal  1979  . Sinus Polyps       Current Outpatient Medications  Medication Sig Dispense Refill  . albuterol (PROVENTIL) (2.5 MG/3ML) 0.083% nebulizer solution USE 1 VIAL VIA NEBULIZER EVERY 6 HOURS AS NEEDED FOR WHEEZING OR SHORTNESS OF BREATH 225 mL 0  . ALPRAZolam (XANAX) 0.25 MG tablet TAKE 1 TO 2 TABLETS BY MOUTH DAILY AS NEEDED 30 tablet 2  . famotidine (PEPCID) 20 MG tablet Take 20 mg by mouth daily.    Marland Kitchen FLUoxetine (PROZAC) 20 MG capsule TAKE 1 CAPSULE(20 MG) BY MOUTH DAILY 90 capsule 1  . fluticasone (FLONASE) 50 MCG/ACT nasal spray Place 2 sprays into both nostrils daily.    . fluticasone furoate-vilanterol (BREO ELLIPTA) 200-25 MCG/INH AEPB INHALE 1 PUFF INTO THE LUNGS DAILY  180 each 3  . levocetirizine (XYZAL) 5 MG tablet Take 5 mg by mouth every evening.    Marland Kitchen lisinopril-hydrochlorothiazide (ZESTORETIC) 20-12.5 MG tablet Take 2 tablets by mouth daily. (Patient taking differently: Take 1.5 tablets by mouth daily.) 180 tablet 2  . metoprolol succinate (TOPROL-XL) 50 MG 24 hr tablet Take 0.5 tablets (25 mg total) by mouth daily. Take with or immediately following a meal. 90 tablet 3  . montelukast (SINGULAIR) 10 MG tablet Take 1 tablet (10 mg total) by mouth at bedtime. 90 tablet 3  . Multiple Vitamin (MULTIVITAMIN) tablet Take 1 tablet by mouth daily.    . Multiple Vitamins-Minerals (VISION FORMULA PO) Take by mouth daily.     Marland Kitchen PROAIR RESPICLICK 416 (90 Base) MCG/ACT AEPB INHALE 2 PUFFS INTO THE LUNGS EVERY 6 HOURS AS NEEDED 1 each 2  . Probiotic Product (ALIGN) 4 MG CAPS Take by mouth daily.    . simvastatin (ZOCOR)  10 MG tablet TAKE 1 TABLET(10 MG) BY MOUTH AT BEDTIME 90 tablet 1  . triamcinolone cream (KENALOG) 0.1 % Apply 1 application topically 2 (two) times daily. 453.6 g 1  . XARELTO 20 MG TABS tablet TAKE 1 TABLET(20 MG) BY MOUTH DAILY WITH SUPPER 90 tablet 3   No current facility-administered medications for this visit.     ALLERGIES: Patient has no known allergies.  Family History  Problem Relation Age of Onset  . Arthritis Mother   . Ovarian cancer Mother        Metastatic  . Breast cancer Mother 30  . Thyroid disease Mother   . Parkinson's disease Father   . Crohn's disease Brother   . Schizophrenia Brother   . Colon cancer Neg Hx   . Esophageal cancer Neg Hx   . Rectal cancer Neg Hx   . Stomach cancer Neg Hx     Social History   Socioeconomic History  . Marital status: Divorced    Spouse name: Not on file  . Number of children: 3  . Years of education: 69  . Highest education level: Not on file  Occupational History  . Occupation: Medical sales representative   Tobacco Use  . Smoking status: Former Smoker    Packs/day: 0.25    Years: 10.00    Pack years: 2.50    Types: Cigarettes  . Smokeless tobacco: Never Used  . Tobacco comment: 35 years ago; smoke socially  Vaping Use  . Vaping Use: Never used  Substance and Sexual Activity  . Alcohol use: No    Alcohol/week: 0.0 standard drinks  . Drug use: No  . Sexual activity: Not Currently    Birth control/protection: Post-menopausal  Other Topics Concern  . Not on file  Social History Narrative   Separated    Three children ( one lives locally)    Social Determinants of Health   Financial Resource Strain: Not on file  Food Insecurity: No Food Insecurity  . Worried About Charity fundraiser in the Last Year: Never true  . Ran Out of Food in the Last Year: Never true  Transportation Needs: No Transportation Needs  . Lack of Transportation (Medical): No  . Lack of Transportation (Non-Medical): No  Physical Activity: Sufficiently  Active  . Days of Exercise per Week: 7 days  . Minutes of Exercise per Session: 50 min  Stress: No Stress Concern Present  . Feeling of Stress : Not at all  Social Connections: Moderately Isolated  . Frequency of Communication with Friends and Family:  More than three times a week  . Frequency of Social Gatherings with Friends and Family: More than three times a week  . Attends Religious Services: Never  . Active Member of Clubs or Organizations: No  . Attends Archivist Meetings: 1 to 4 times per year  . Marital Status: Divorced  Human resources officer Violence: Not At Risk  . Fear of Current or Ex-Partner: No  . Emotionally Abused: No  . Physically Abused: No  . Sexually Abused: No    Review of Systems  Skin:       Rt.vulvar cyst/lesion that itches  All other systems reviewed and are negative.   PHYSICAL EXAMINATION:    BP 118/72   Pulse (!) 58   Ht 5\' 6"  (1.676 m)   Wt 181 lb (82.1 kg)   LMP 06/07/2000 (Approximate)   SpO2 98%   BMI 29.21 kg/m     General appearance: alert, cooperative and appears stated age Lungs: clear to auscultation bilaterally Heart: regular rate and rhythm Abdomen: soft, non-tender, no masses,  no organomegaly Extremities: extremities normal, atraumatic, no cyanosis or edema Skin: Skin color, texture, turgor normal. Annular slightly red and raised rash of left lateral thigh.  No abnormal inguinal nodes palpated  Pelvic: External genitalia:  3 cm soft, nontender mass of the subcutaneous tissue of right labia majora.  Smaller 4 mm cystic nontender area medial to this.               Urethra:  normal appearing urethra with no masses, tenderness or lesions              Bartholins and Skenes: normal                 Vagina: normal appearing vagina with normal color and discharge, no lesions              Cervix: no lesions                Bimanual Exam:  Uterus:  normal size, contour, position, consistency, mobility, non-tender               Adnexa: no mass, fullness, tenderness              Rectal exam: Yes.  .  Confirms.              Anus:  normal sphincter tone, no lesions  Chaperone was present for exam.  ASSESSMENT  Right vulvar mass.  Possible etiologies are enlarged lymph node, lipoma, hernia, large sebaceous cyst.  I doubt abscess.  Cervical cancer screening.  Atrial fibrillation.  On Xarelto.  Hx cutaneous T cell lymphoma.   PLAN  Will proceed with translabial vulvar ultrasound at [facility] to define the mass further.  I recommend a follow up visit after the pelvic ultrasound has been completed.  OK to take Xyzal prn.  Pap with reflex HPV testing done.   39 min  total time was spent for this patient encounter, including preparation, face-to-face counseling with the patient, coordination of care, and documentation of the encounter.

## 2020-10-01 NOTE — Telephone Encounter (Signed)
Please schedule a translabial vulvar ultrasound for my patient as [place].  She has a 3 cm subcutaneous mass of the labia majora.   She will need a follow up appointment with me in the office 1 - 2 days after the ultrasound has been performed.

## 2020-10-02 NOTE — Telephone Encounter (Signed)
[facility] called back and said to place US pelvic limited. Order placed she will call patient to schedule.

## 2020-10-02 NOTE — Telephone Encounter (Signed)
Patient scheduled on 10/08/20 @ 2:30pm

## 2020-10-02 NOTE — Telephone Encounter (Signed)
I called and spoke with ultrasound scheduler at [facility] she will call me back to confirm the correct ultrasound is placed.

## 2020-10-06 LAB — CYTOLOGY - PAP
Comment: NEGATIVE
Diagnosis: UNDETERMINED — AB
High risk HPV: NEGATIVE

## 2020-10-08 ENCOUNTER — Ambulatory Visit
Admission: RE | Admit: 2020-10-08 | Discharge: 2020-10-08 | Disposition: A | Discharge status: Home / Self Care | Payer: Medicare Other | Source: Ambulatory Visit | Attending: Obstetrics and Gynecology | Admitting: Obstetrics and Gynecology

## 2020-10-08 DIAGNOSIS — N9089 Other specified noninflammatory disorders of vulva and perineum: Secondary | ICD-10-CM

## 2020-10-08 IMAGING — US US PELVIS LIMITED
1 series · 14 of 25 positions shown · non-contrast
Comparison: None.

CLINICAL DATA: Subcutaneous mass of labia majora.RIGHT vulvar mass
intermittently for several months. No associated pain.

EXAM:
US PELVIS LIMITED
TECHNIQUE: Ultrasound examination of the pelvic soft tissues was performed in
the area of clinical concern.

[Series 1: us pelvis limited · 0.05mm/px · 28 acquisitions, 14 frames shown]
[im 1/28]
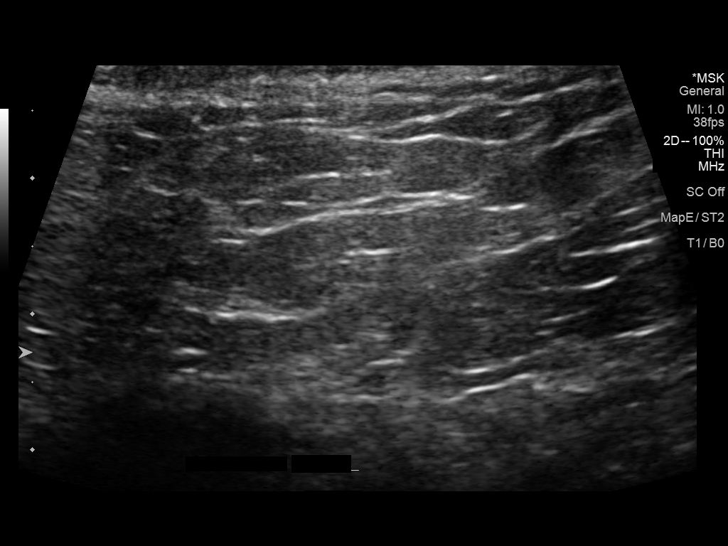
[im 3/28]
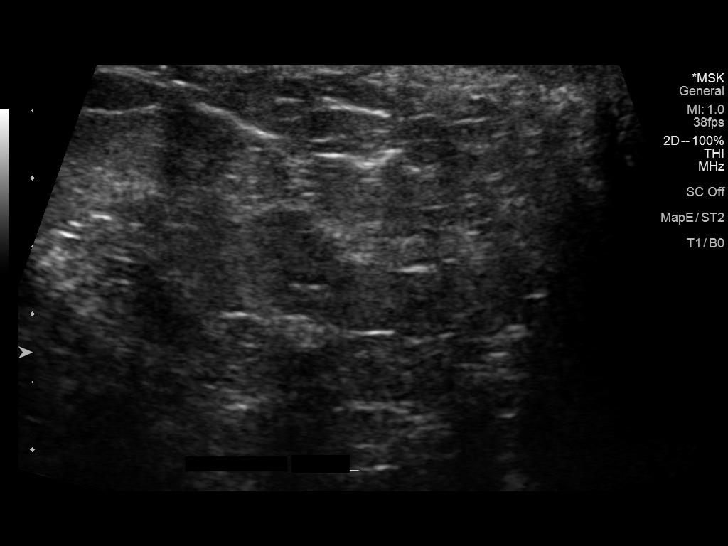
[im 5/28]
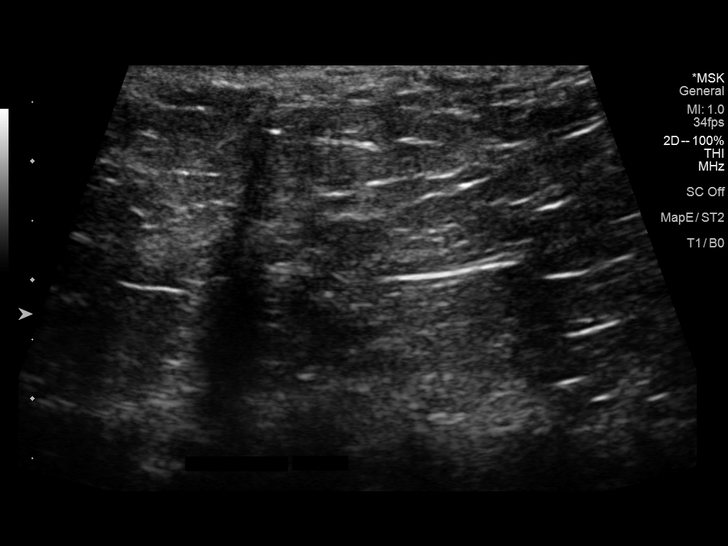
[im 7/28]
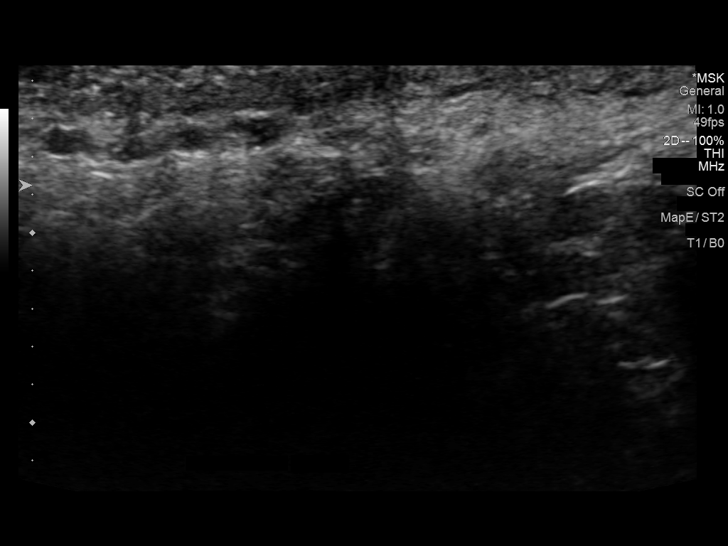
[im 10/28]
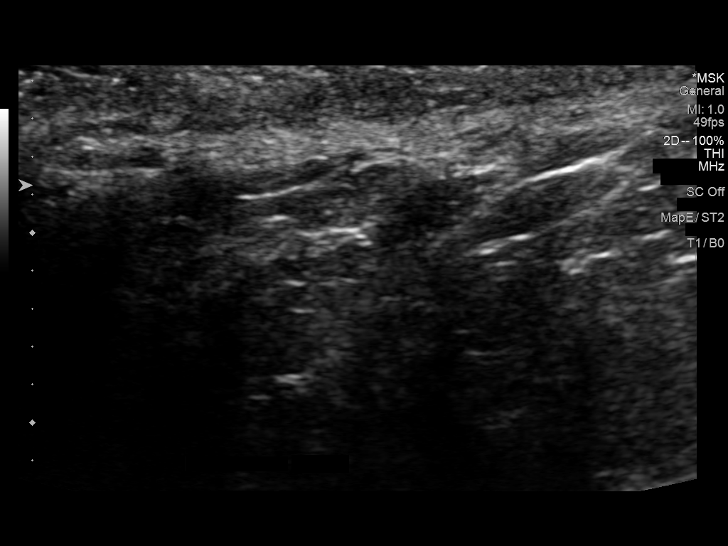
[im 11/28]
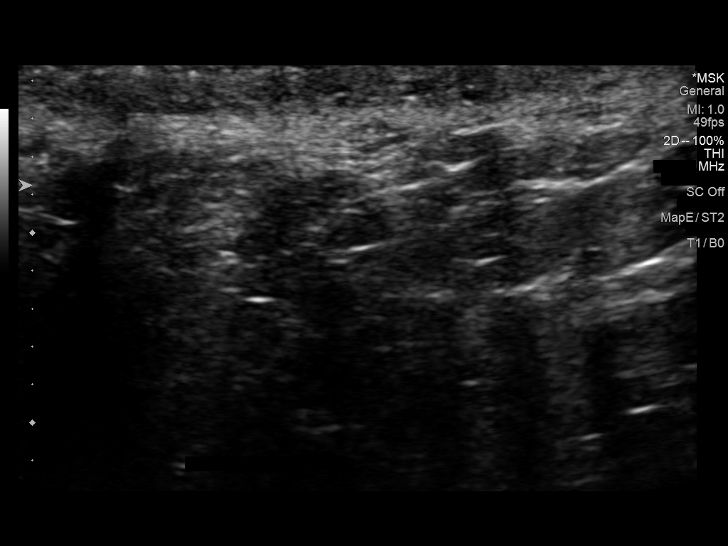
[im 13/28]
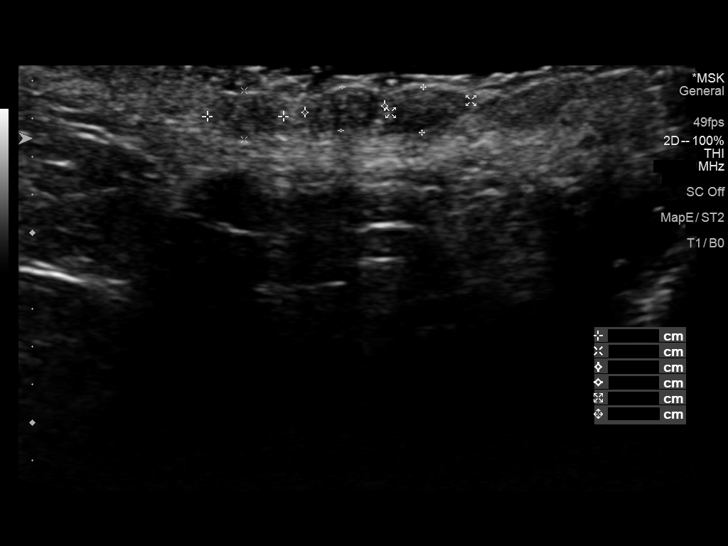
[im 15/28]
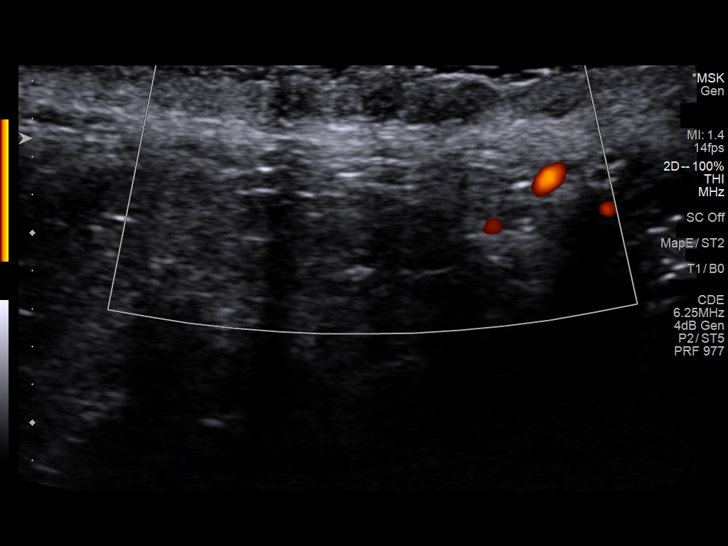
[im 17/28]
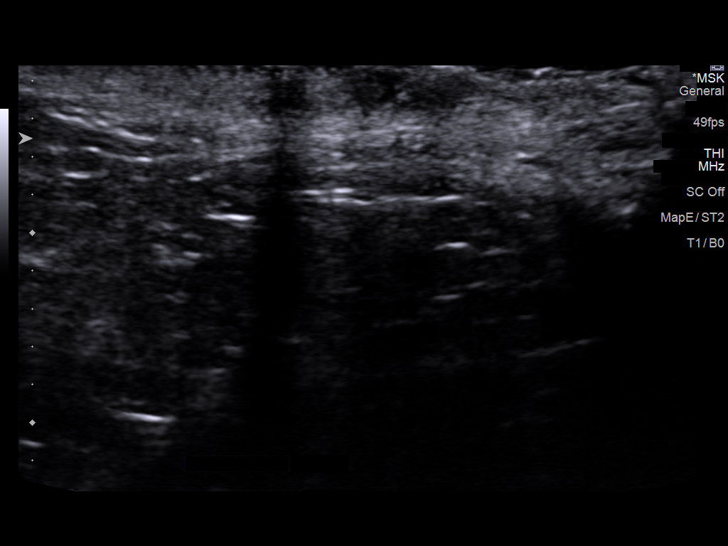
[im 19/28]
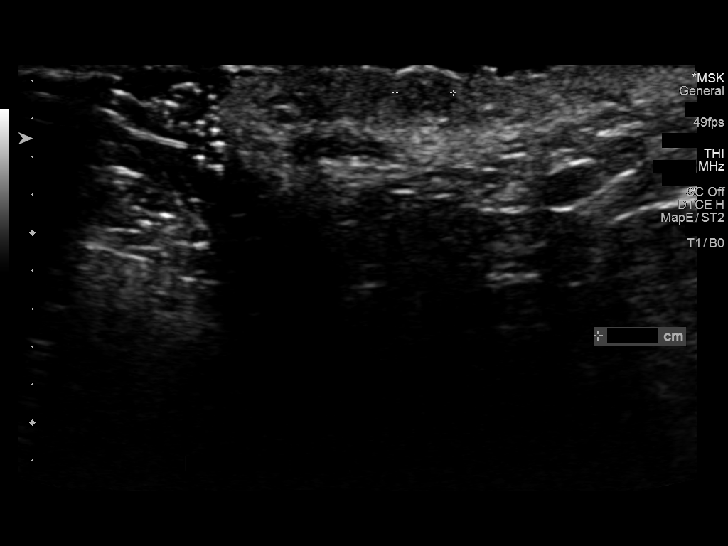
[im 21/28]
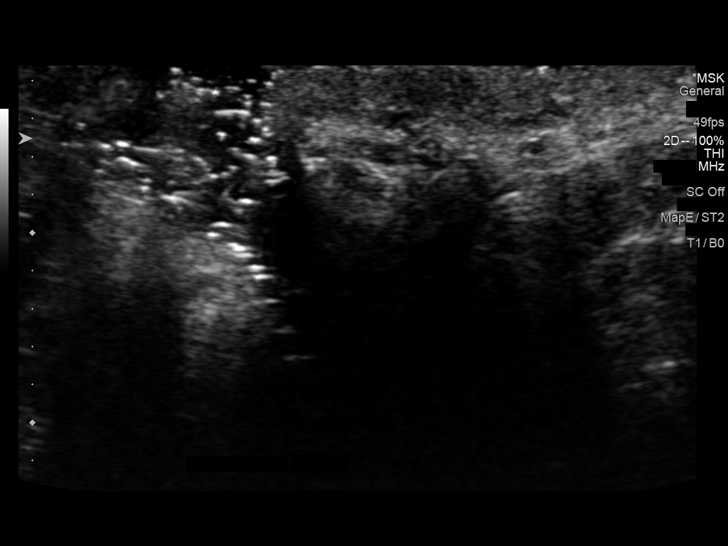
[im 23/28]
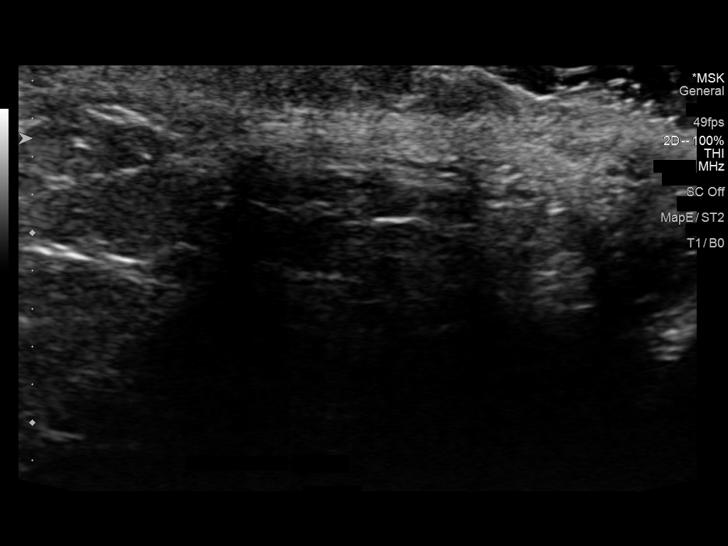
[im 25/28]
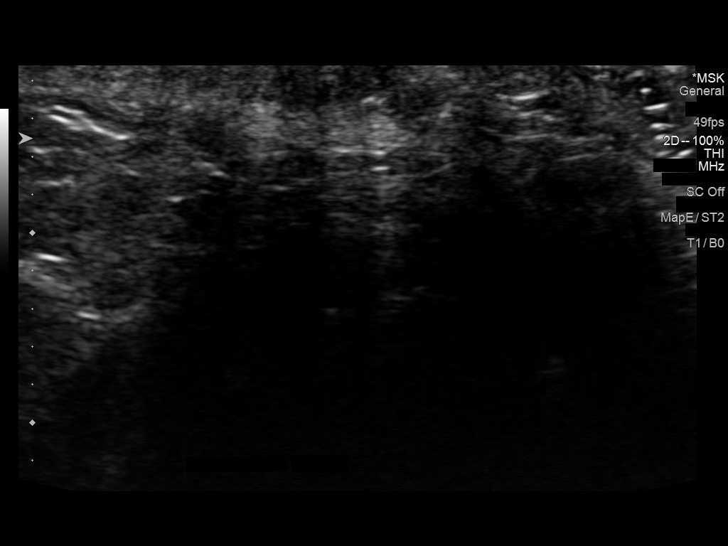
[im 28/28]
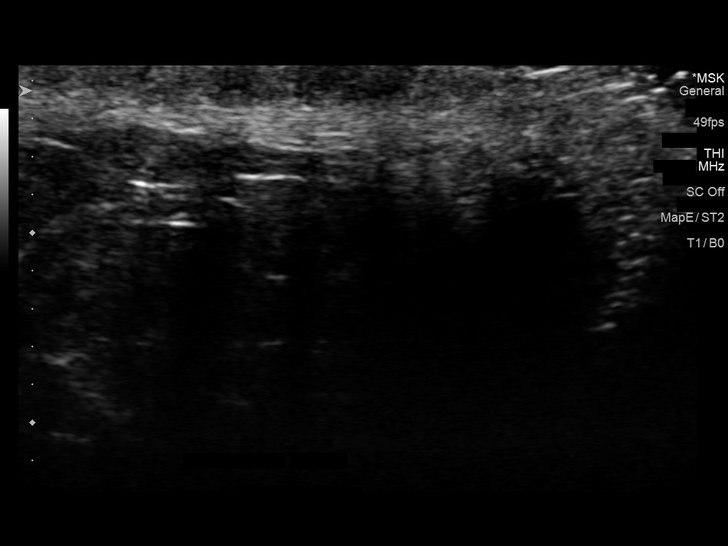

[14 of 25 positions shown; findings below may reference images not displayed]

FINDINGS: Bilateral labia majora was evaluated by ultrasound.

Three separate hypoechoic masses are delineated within the
superficial soft tissues of the RIGHT labia, each measuring 4 mm
greatest dimension, possibly contiguous, all avascular, most likely
complicated sebaceous cyst/cysts containing debris. No evidence of
extension into the deeper soft tissues.
IMPRESSION: Probable sebaceous cyst/cysts within the superficial soft tissues of
the RIGHT labia, 3 separate components measuring 4 mm each but
possibly all contiguous. Neoplastic process is considered less
likely but would consider follow-up ultrasound in 2-3 months to
ensure stability or resolution.

## 2020-10-10 ENCOUNTER — Telehealth: Payer: Self-pay | Admitting: Pharmacist

## 2020-10-10 NOTE — Chronic Care Management (AMB) (Signed)
Chronic Care Management Pharmacy Assistant   Name: Glenda Bauer  MRN: [id number] DOB: 10/17/1950  Glenda Bauer is an 70 y.o. year old female who presents for her initial CCM visit with the clinical pharmacist.   Reason for Encounter: Chart Prep for CPP visit on 10/15/20.   Conditions to be addressed/monitored: HTN, Anxiety, Depression and A-Fib.  Primary concerns for visit include: Hypertension, Depression, Anxiety and atrial fibrillation.   Recent office visits:  None.   Recent consult visits:  05/13/20 Gwyndolyn Kaufman MD (Cardiology) - presented to clinic for hypertension, aortic aneurysm and A-Fib. Chest CT ordered. Changed Lisinopril-hydrochlorothiazide 20-12.5mg  from 1 tablet daily to 2 tablets daily. Follow up in June after CT is completed.   10/01/20 Rolanda Lundborg MD (Gynecology) - presented to clinic for vulvar mass and cervical cancer screening. No medication changes. Return for follow up after ultrasound complete.   Hospital visits:  None in previous 6 months   Fill History: ALPRAZOLAM 0.25MG  TABLETS 09/03/2020 15DS   FLUOXETINE 20MG  CAPSULES 08/20/2020 90DS   FLECAINIDE 50MG  TABLETS 08/11/2020 90DS   BREO ELLIPTA 200-25 MCG/INH AEPB 07/21/2020 90DS   LISINOPRIL-HCTZ 20/12.5MG  TABLETS 08/02/2020 11DS   METOPROLOL ER SUCCINATE 50MG  TABS 09/18/2020 90DS   MONTELUKAST 10MG  TABLETS 09/18/2020 90DS   XARELTO 20MG  TABLETS 08/20/2020 90DS   SIMVASTATIN 10MG  TABLETS 09/25/2020 90DS     Medications: Outpatient Encounter Medications as of 10/10/2020  Medication Sig  . albuterol (PROVENTIL) (2.5 MG/3ML) 0.083% nebulizer solution USE 1 VIAL VIA NEBULIZER EVERY 6 HOURS AS NEEDED FOR WHEEZING OR SHORTNESS OF BREATH  . ALPRAZolam (XANAX) 0.25 MG tablet TAKE 1 TO 2 TABLETS BY MOUTH DAILY AS NEEDED  . famotidine (PEPCID) 20 MG tablet Take 20 mg by mouth daily.  Marland Kitchen FLUoxetine (PROZAC) 20 MG capsule TAKE 1 CAPSULE(20 MG) BY MOUTH DAILY  .  fluticasone (FLONASE) 50 MCG/ACT nasal spray Place 2 sprays into both nostrils daily.  . fluticasone furoate-vilanterol (BREO ELLIPTA) 200-25 MCG/INH AEPB INHALE 1 PUFF INTO THE LUNGS DAILY  . levocetirizine (XYZAL) 5 MG tablet Take 5 mg by mouth every evening.  Marland Kitchen lisinopril-hydrochlorothiazide (ZESTORETIC) 20-12.5 MG tablet Take 2 tablets by mouth daily. (Patient taking differently: Take 1.5 tablets by mouth daily.)  . metoprolol succinate (TOPROL-XL) 50 MG 24 hr tablet Take 0.5 tablets (25 mg total) by mouth daily. Take with or immediately following a meal.  . montelukast (SINGULAIR) 10 MG tablet Take 1 tablet (10 mg total) by mouth at bedtime.  . Multiple Vitamin (MULTIVITAMIN) tablet Take 1 tablet by mouth daily.  . Multiple Vitamins-Minerals (VISION FORMULA PO) Take by mouth daily.   Marland Kitchen PROAIR RESPICLICK 761 (90 Base) MCG/ACT AEPB INHALE 2 PUFFS INTO THE LUNGS EVERY 6 HOURS AS NEEDED  . Probiotic Product (ALIGN) 4 MG CAPS Take by mouth daily.  . simvastatin (ZOCOR) 10 MG tablet TAKE 1 TABLET(10 MG) BY MOUTH AT BEDTIME  . triamcinolone cream (KENALOG) 0.1 % Apply 1 application topically 2 (two) times daily.  [name] 20 MG TABS tablet TAKE 1 TABLET(20 MG) BY MOUTH DAILY WITH SUPPER   No facility-administered encounter medications on file as of 10/10/2020.   Patient Questions:  Have you seen any other providers since your last visit? Yes.   Any changes in your medications or health? Yes.   Any side effects from any medications? Yes patient said one of her medications had messed her stomach up but don't remember the name of which one. She states she is better with it  now. Patient was having some dizziness from her A-Fib but is a little better now.   Do you have an symptoms or problems not managed by your medications? No.  Any concerns about your health right now? No. Patient feels like everything is being handled and addressed.   Has your provider asked that you check blood pressure, blood  sugar, or follow special diet at home? Yes she has been recommended the mediterranean diet. Patient checks her blood pressure daily. Patient keeps a log.  Do you get any type of exercise on a regular basis? Yes. Patient walks a mile and half a day.   Can you think of a goal you would like to reach for your health? Yes. Patient would like to lose weight and be able to go on longer walks.   Do you have any problems getting your medications? Yes. She states the Xarelto and [employer] can cost a little much at times. Patient does not qualify for assistance at this time.   Is there anything that you would like to discuss during the appointment? Patient would like to discuss cost, interactions and side effects from medications with [name] at her upcoming appointment.    Please bring medications and supplements to appointment as well as any blood pressure or blood glucose readings.   Patient states that she is prescribed Flecainide 50mg  twice daily and per Dr. Marlene Lard note on 07/25/20. Medication is not listed on current outpatient medication list but was recently dispensed.   Star Rating Drugs:  Lisinopril-hydrochlorothiazide 20/12.5mg  - last filled on 08/02/20 11DS at [facility] Simvastatin 10mg  - last filled on 09/25/20 90DS at [facility] [phone]

## 2020-10-13 ENCOUNTER — Encounter: Payer: Self-pay | Admitting: Obstetrics and Gynecology

## 2020-10-14 ENCOUNTER — Telehealth: Payer: Self-pay | Admitting: *Deleted

## 2020-10-14 DIAGNOSIS — N9089 Other specified noninflammatory disorders of vulva and perineum: Secondary | ICD-10-CM

## 2020-10-14 NOTE — Telephone Encounter (Signed)
Order placed for 2-3 month follow up.

## 2020-10-14 NOTE — Telephone Encounter (Signed)
-----   Message from Ramond Craver, [place] sent at 10/14/2020  9:23 AM EDT ----- Regarding: vulvar ultrasound to evaluate right labia majora swelling.  vulvar ultrasound to evaluate right labia majora swelling.   Per Dr. Quincy Simmonds "The radiologist is recommending a follow up ultrasound in 2 - 3 months.  Please schedule this appointment."

## 2020-10-15 ENCOUNTER — Ambulatory Visit (INDEPENDENT_AMBULATORY_CARE_PROVIDER_SITE_OTHER): Payer: Medicare Other | Admitting: Pharmacist

## 2020-10-15 ENCOUNTER — Other Ambulatory Visit: Payer: Self-pay

## 2020-10-15 DIAGNOSIS — F419 Anxiety disorder, unspecified: Secondary | ICD-10-CM

## 2020-10-15 DIAGNOSIS — F32A Depression, unspecified: Secondary | ICD-10-CM | POA: Diagnosis not present

## 2020-10-15 DIAGNOSIS — I4891 Unspecified atrial fibrillation: Secondary | ICD-10-CM

## 2020-10-15 DIAGNOSIS — I10 Essential (primary) hypertension: Secondary | ICD-10-CM

## 2020-10-15 NOTE — Progress Notes (Signed)
Chronic Care Management Pharmacy Note  10/30/2020 Name:  Glenda Bauer MRN:  [id number] DOB:  Oct 03, 1950  Subjective: Glenda Bauer is an 70 y.o. year old female who is a primary patient of [name], NP.  The CCM team was consulted for assistance with disease management and care coordination needs.    Engaged with patient face to face for initial visit in response to provider referral for pharmacy case management and/or care coordination services.   Consent to Services:  The patient was given information about Chronic Care Management services, agreed to services, and gave verbal consent prior to initiation of services.  Please see initial visit note for detailed documentation.   Patient Care Team: [name], NP as PCP - General (Family Medicine) [name], MD as PCP - Cardiology (Cardiology) Viona Gilmore, [facility] as Pharmacist (Pharmacist)  Recent office visits: 09/29/20 Randel Pigg, LPN: Patient presented for AWV.  03/27/20 [name], NP: Patient presented for annual exam. D/c'd lisinopril/HCTZ for low BP to be replaced with metoprolol and Xarelto for newly diagnosed Afib. Prescribed simvastatin 10 mg daily.  Recent consult visits: 10/01/20 Rolanda Lundborg MD (Gynecology) - presented to clinic for vulvar mass and cervical cancer screening. No medication changes. Return for follow up after ultrasound complete.   07/25/20 Malka So, PA (cardiology): Patient presented for Afib follow up.   05/13/20 Gwyndolyn Kaufman MD (Cardiology) - presented to clinic for hypertension, aortic aneurysm and A-Fib. Chest CT ordered. Changed Lisinopril-hydrochlorothiazide 20-12.5mg  from 1 tablet daily to 1.5 tablets daily. Follow up in June after CT is completed.  Hospital visits: None in previous 6 months  Objective:  Lab Results  Component Value Date   CREATININE 0.97 05/21/2020   BUN 30 (H) 05/21/2020   GFR 66.11 02/06/2019   GFRNONAA 59 (L)  05/21/2020   GFRAA 68 05/21/2020   NA 137 05/21/2020   K 3.9 05/21/2020   CALCIUM 9.7 05/21/2020   CO2 23 05/21/2020   GLUCOSE 106 (H) 05/21/2020    Lab Results  Component Value Date/Time   GFR 66.11 02/06/2019 07:48 AM   GFR 76.83 06/22/2017 08:27 AM    Last diabetic Eye exam: No results found for: HMDIABEYEEXA  Last diabetic Foot exam: No results found for: HMDIABFOOTEX   Lab Results  Component Value Date   CHOL 208 (H) 03/27/2020   HDL 77 03/27/2020   LDLCALC 110 (H) 03/27/2020   TRIG 100 03/27/2020   CHOLHDL 2.7 03/27/2020    Hepatic Function Latest Ref Rng & Units 03/27/2020 02/06/2019 06/22/2017  Total Protein 6.1 - 8.1 g/dL 6.6 6.3 6.7  Albumin 3.5 - 5.2 g/dL - 4.0 4.2  AST 10 - 35 U/L 18 23 20   ALT 6 - 29 U/L 20 36(H) 32  Alk Phosphatase 39 - 117 U/L - 54 58  Total Bilirubin 0.2 - 1.2 mg/dL 0.7 0.8 0.7  Bilirubin, Direct 0.0 - 0.3 mg/dL - - 0.1    Lab Results  Component Value Date/Time   TSH 1.26 03/27/2020 09:17 AM   TSH 1.10 02/06/2019 07:48 AM    CBC Latest Ref Rng & Units 03/27/2020 02/06/2019 06/22/2017  WBC 3.8 - 10.8 Thousand/uL 5.3 4.3 4.3  Hemoglobin 11.7 - 15.5 g/dL 16.7(H) 14.8 15.1(H)  Hematocrit 35.0 - 45.0 % 48.3(H) 42.9 44.5  Platelets 140 - 400 Thousand/uL 243 215.0 261.0    No results found for: VD25OH  Clinical ASCVD: No  The 10-year ASCVD risk score Mikey Bussing DC Jr., et al., 2013) is: 11.5%  Values used to calculate the score:     Age: 80 years     Sex: Female     Is Non-Hispanic African American: No     Diabetic: No     Tobacco smoker: No     Systolic Blood Pressure: 166 mmHg     Is BP treated: Yes     HDL Cholesterol: 77 mg/dL     Total Cholesterol: 208 mg/dL    Depression screen [facility] 2/9 09/29/2020 09/29/2020 03/27/2020  Decreased Interest 0 0 0  Down, Depressed, Hopeless 0 0 0  PHQ - 2 Score 0 0 0  Altered sleeping - - -  Tired, decreased energy - - -  Change in appetite - - -  Feeling bad or failure about yourself  - - -   Trouble concentrating - - -  Moving slowly or fidgety/restless - - -  Suicidal thoughts - - -  PHQ-9 Score - - -     CHA2DS2/VAS Stroke Risk Points  Current as of just now     3 >= 2 Points: High Risk  1 - 1.99 Points: Medium Risk  0 Points: Low Risk    No Change      Details    This score determines the patient's risk of having a stroke if the  patient has atrial fibrillation.       Points Metrics  0 Has Congestive Heart Failure:  No    Current as of just now  0 Has Vascular Disease:  No    Current as of just now  1 Has Hypertension:  Yes    Current as of just now  1 Age:  89    Current as of just now  0 Has Diabetes:  No    Current as of just now  0 Had Stroke:  No  Had TIA:  No  Had Thromboembolism:  No    Current as of just now  1 Female:  Yes    Current as of just now     Social History   Tobacco Use  Smoking Status Former Smoker  . Packs/day: 0.25  . Years: 10.00  . Pack years: 2.50  . Types: Cigarettes  Smokeless Tobacco Never Used  Tobacco Comment   35 years ago; smoke socially   BP Readings from Last 3 Encounters:  10/01/20 118/72  07/25/20 106/74  05/13/20 120/70   Pulse Readings from Last 3 Encounters:  10/28/20 60  10/01/20 (!) 58  07/25/20 (!) 57   Wt Readings from Last 3 Encounters:  10/28/20 181 lb (82.1 kg)  10/01/20 181 lb (82.1 kg)  07/25/20 177 lb 3.2 oz (80.4 kg)   BMI Readings from Last 3 Encounters:  10/28/20 29.21 kg/m  10/01/20 29.21 kg/m  07/25/20 28.60 kg/m    Assessment/Interventions: Review of patient past medical history, allergies, medications, health status, including review of consultants reports, laboratory and other test data, was performed as part of comprehensive evaluation and provision of chronic care management services.   SDOH:  (Social Determinants of Health) assessments and interventions performed: Yes SDOH Interventions   Flowsheet Row Most Recent Value  SDOH Interventions   Financial Strain  Interventions Intervention Not Indicated  Transportation Interventions Intervention Not Indicated     SDOH Screenings   Alcohol Screen: Not on file  Depression (PHQ2-9): Low Risk   . PHQ-2 Score: 0  Financial Resource Strain: Low Risk   . Difficulty of Paying Living Expenses: Not hard at all  Food  Insecurity: No Food Insecurity  . Worried About Charity fundraiser in the Last Year: Never true  . Ran Out of Food in the Last Year: Never true  Housing: Low Risk   . Last Housing Risk Score: 0  Physical Activity: Insufficiently Active  . Days of Exercise per Week: 5 days  . Minutes of Exercise per Session: 20 min  Social Connections: Moderately Isolated  . Frequency of Communication with Friends and Family: More than three times a week  . Frequency of Social Gatherings with Friends and Family: Twice a week  . Attends Religious Services: Never  . Active Member of Clubs or Organizations: No  . Attends Archivist Meetings: 1 to 4 times per year  . Marital Status: Divorced  Stress: Stress Concern Present  . Feeling of Stress : Rather much  Tobacco Use: Medium Risk  . Smoking Tobacco Use: Former Smoker  . Smokeless Tobacco Use: Never Used  Transportation Needs: No Transportation Needs  . Lack of Transportation (Medical): No  . Lack of Transportation (Non-Medical): No   Patient is working [employer] on [place] and Wed and then works at home with [employer] [facility] [facility] [employer] almost every other day for 4-5 hour shifts. She enjoys both jobs and has been at [employer] for 6 years. She has been living in [place] for 7 years, lived in [place] for 7 years prior and then [place] prior to that.   Patient walks her dogs for exercises and has 2 little dogs of her own. 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  Patient mostly follows a mediterranean diet except she doesn't like fish or olives or fruit. She has gained about 12 lbs within the year.  She eats lots of vegetables, more chicken, and [place], some seafood, and not very much red meat. She has cut back on desserts to once a week. She drinks water, coffee the in morning, and herbal tea.  Patient reports she sleeps pretty well and takes 1/2 tablet of alprazolam on restless nights. She has been trying to get to 8 hours per night and takes a nap sometimes.  Patient uses a pillbox for her medications currently. She does report feeling tippy and not dizzy or off balance and denies any falls other than tripping over her dog. She wasn't sure if this was from medications or not.    CCM Care Plan  No Known Allergies  Medications Reviewed Today    Reviewed by Franco Collet, CMA (Certified Medical Assistant) on 10/28/20 at [phone]  Med List Status: <None>  Medication Order Taking? Sig Documenting Provider Last Dose Status Informant  albuterol (PROVENTIL) (2.5 MG/3ML) 0.083% nebulizer solution [id number] Yes USE 1 VIAL VIA NEBULIZER EVERY 6 HOURS AS NEEDED FOR WHEEZING OR SHORTNESS OF BREATH Nafziger, Cory, NP Taking Active   ALPRAZolam (XANAX) 0.25 MG tablet [id number] Yes TAKE 1 TO 2 TABLETS BY MOUTH DAILY AS NEEDED Nafziger, Tommi Rumps, NP Taking Active   famotidine (PEPCID) 20 MG tablet [id number] Yes Take 20 mg by mouth daily. [provider] Taking Active   flecainide (TAMBOCOR) 50 MG tablet [id number] Yes Take 50 mg by mouth 2 (two) times daily. [provider] Taking Active   FLUoxetine (PROZAC) 20 MG capsule [id number] Yes TAKE 1 CAPSULE(20 MG) BY MOUTH DAILY Nafziger, Tommi Rumps, NP Taking Active   fluticasone (FLONASE) 50 MCG/ACT nasal spray [id number] Yes Place 2 sprays into both nostrils as  needed. [provider] Taking Active   fluticasone furoate-vilanterol (BREO ELLIPTA) 200-25 MCG/INH AEPB [id number] Yes INHALE 1 PUFF INTO THE LUNGS DAILY Nafziger, Tommi Rumps, NP Taking Active   levocetirizine (XYZAL) 5 MG tablet [id number] Yes Take 5 mg by mouth every evening. [provider] Taking Active   lisinopril-hydrochlorothiazide (ZESTORETIC) 20-12.5 MG tablet [id number] Yes Take 2 tablets by mouth daily.  Patient taking differently: Take 1.5 tablets by mouth daily.   [name], MD Taking Active   metoprolol succinate (TOPROL-XL) 50 MG 24 hr tablet [id number] Yes Take 0.5 tablets (25 mg total) by mouth daily. Take with or immediately following a meal. Fenton, Clint R, PA Taking Active   montelukast (SINGULAIR) 10 MG tablet [id number] Yes Take 1 tablet (10 mg total) by mouth at bedtime. Nafziger, Tommi Rumps, NP Taking Active   Multiple Vitamin (MULTIVITAMIN) tablet [id number] Yes Take 1 tablet by mouth daily. [provider] Taking Active   Multiple Vitamins-Minerals (VISION FORMULA PO) [id number] Yes Take by mouth daily.  [provider] Taking Active   PROAIR RESPICLICK [phone] Base) MCG/ACT AEPB [id number] Yes INHALE 2 PUFFS INTO THE LUNGS EVERY 6 HOURS AS NEEDED Nafziger, Tommi Rumps, NP Taking Active   Probiotic Product (ALIGN) 4 MG CAPS [id number] Yes Take by mouth daily. [provider] Taking Active   simvastatin (ZOCOR) 10 MG tablet [id number] Yes TAKE 1 TABLET(10 MG) BY MOUTH AT BEDTIME Nafziger, Tommi Rumps, NP Taking Active   triamcinolone cream (KENALOG) 0.1 % [id number] Yes Apply 1 application topically 2 (two) times daily. Nafziger, Tommi Rumps, NP Taking Active   XARELTO 20 MG TABS tablet [id number] Yes TAKE 1 TABLET(20 MG) BY MOUTH DAILY WITH SUPPER [name], NP Taking Active           Patient Active Problem List   Diagnosis Date Noted  . Sebaceous cyst 2022  . Paroxysmal atrial fibrillation ([place]) 04/03/2020  . Secondary hypercoagulable state ([place]) 04/03/2020  . Thyroid nodule   . Osteoarthritis   . Cyst of knee joint 02/13/2016  . Sinusitis 10/30/2015  . Asthma with exacerbation 10/30/2015  . Sciatica 03/06/2015  . Abdominal pain, right upper quadrant 02/12/2015  . 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 Essential hypertension 05/21/2014  . Anxiety and depression 05/21/2014  . Asthma, chronic 05/21/2014    Immunization History  Administered Date(s) Administered  . Fluad Quad(high Dose 65+) 03/27/2020  . Influenza, High Dose Seasonal PF 02/13/2016, 06/21/2018, 02/15/2019  . Influenza,inj,Quad PF,6+ Mos 05/21/2014  . PFIZER(Purple Top)SARS-COV-2 Vaccination 08/06/2019, 09/04/2019, 04/05/2020    Conditions to be addressed/monitored:  Hypertension, Hyperlipidemia, Atrial Fibrillation, Asthma, Depression, Anxiety, Osteoarthritis and Allergic Rhinitis  Care Plan : CCM Pharmacy Care Plan  Updates made by Viona Gilmore, [place] since 10/30/2020 12:00 AM    Problem: Problem: Hypertension, Hyperlipidemia, Atrial Fibrillation, Asthma, Depression, Anxiety, Osteoarthritis and Allergic Rhinitis     Long-Range Goal: Patient-Specific Goal   Start Date: 10/15/2020  Expected End Date: 10/15/2021  This Visit's Progress: On track  Priority: High  Note:   Current Barriers:  . Unable to independently afford treatment regimen  Pharmacist Clinical Goal(s):  Marland Kitchen Patient will achieve adherence to monitoring guidelines and medication adherence to achieve therapeutic efficacy through collaboration with PharmD and provider.   Interventions: . 1:1 collaboration with [name], NP regarding development and update of comprehensive plan of care as evidenced by provider attestation and co-signature . Inter-disciplinary care team collaboration (see longitudinal plan of care) . Comprehensive medication review  performed; medication list updated in electronic medical record  Hypertension (BP goal <140/90) -Controlled -Current treatment: . Lisinopril - HCTZ 20-12.5 1.5 tablets daily (rx said 2 tablets daily) - every 2 hours going to the bathroom . Metoprolol succinate 50 mg 1/2 tablet daily -Medications previously tried: none  -Current home readings: 119/85, 114/83, 113/84, 113/81,  129/87, 109/77, 113/73, 104/73 - HR 69, 64, 58, 62, 64, 61, 58, 73 -Current dietary habits: cut down on salt and looking at sodium content; eats out about 50% of the time -Current exercise habits: walking dogs -Denies hypotensive/hypertensive symptoms -Educated on Exercise goal of 150 minutes per week; Importance of home blood pressure monitoring; Proper BP monitoring technique; -Counseled to monitor BP at home weekly, document, and provide log at future appointments -Counseled on diet and exercise extensively Recommended to continue current medication  Hyperlipidemia: (LDL goal < 100) -Uncontrolled -Current treatment: . Simvastatin 10 mg 1 tablet daily -Medications previously tried: none  -Current dietary patterns: eats out about 50% of the time; uses an air fryer -Current exercise habits: walking dogs -Educated on Cholesterol goals;  Benefits of statin for ASCVD risk reduction; Importance of limiting foods high in cholesterol; Exercise goal of 150 minutes per week; -Counseled on diet and exercise extensively Recommended to continue current medication Recommended repeat lipid panel  Atrial Fibrillation (Goal: prevent stroke and major bleeding) -Controlled -CHADSVASC: 3 -Current treatment: . Rate/rhythm control: flecainide 50 mg twice daily, metoprolol succinate 50 mg 1 tablet daily . Anticoagulation: Xarelto 20 mg 1 tablet with supper  -Medications previously tried: none -Home BP and HR readings: provided above  -Counseled on increased risk of stroke due to Afib and benefits of anticoagulation for stroke prevention; importance of adherence to anticoagulant exactly as prescribed; avoidance of NSAIDs due to increased bleeding risk with anticoagulants; -Counseled on diet and exercise extensively Recommended to continue current medication  Asthma (Goal: control symptoms) -Controlled -Current treatment  . Breo Ellipta 200-25 mcg/act inhale 1 puff daily . Albuterol nebulizer as  needed . Proair respiclick 2 puffs as needed . Montelukast 10 mg 1 tablet daily -Medications previously tried: none -Exacerbations requiring treatment in last 6 months: none -Patient reports consistent use of maintenance inhaler -Frequency of rescue inhaler use: once a week (during walks) -Counseled on Proper inhaler technique; Benefits of consistent maintenance inhaler use When to use rescue inhaler Differences between maintenance and rescue inhalers -Recommended to continue current medication Assessed patient finances. Applied for [employer] patient assistance. Recommended switch to albuterol HFA to cut down on cost.  Depression/Anxiety (Goal: minimize symptoms) -Controlled -Current treatment: . Alprazolam 0.25 mg 1-2 tablets daily as needed  . Fluoxetine 20 mg 1 capsule daily -Medications previously tried/failed: citalopram (ineffective, fatigue) -PHQ9: 3 -GAD7: n/a -Connected with [place] for mental health support -Educated on Benefits of medication for symptom control Benefits of cognitive-behavioral therapy with or without medication -Recommended to continue current medication  Allergic rhinitis (Goal: minimize symptoms) -Controlled -Current treatment  . Levocetirizine 5 mg 1/2 tablet every evening (twice a week) . Fluticasone 50 mcg/act 2 sprays daily as needed -Medications previously tried: none  -Recommended trial of Flonase sensimist if regular Flonase is causing nosebleeds   GERD (Goal: minimize symptoms) -Controlled -Current treatment  . Famotidine 20 mg 1 tablet daily -Medications previously tried: none  -Counseled on non-pharmacologic management of symptoms such as elevating the head of your bed, avoiding eating 2-3 hours before bed, avoiding triggering foods such as acidic, spicy, or fatty foods, eating smaller meals, and  wearing clothes that are loose around the waist  Health Maintenance -Vaccine gaps: shingles, tetanus, pneumonia  ([facility]) -Current therapy:  . Multivitamin 1 tablet daily (gummy) . Vision vitamin daily  . Align probiotic daily  . Triamcinolone 0.1% cream twice daily as needed -Educated on Cost vs benefit of each product must be carefully weighed by individual consumer -Patient is satisfied with current therapy and denies issues -Recommended to continue current medication  Patient Goals/Self-Care Activities . Patient will:  - take medications as prescribed check blood pressure weekly, document, and provide at future appointments target a minimum of 150 minutes of moderate intensity exercise weekly  Follow Up Plan: Telephone follow up appointment with care management team member scheduled for: 4 months      Medication Assistance: Application for Breo Ellipta  medication assistance program. in process.  Anticipated assistance start date 11/30/20.  See plan of care for additional detail.  Patient's preferred pharmacy is:  [id number] [address], [address] [place] [address] Phone: [phone] Fax: [phone]  [facility] - [place], [place] - [address] [place] [address] Phone: [phone] Fax: [phone]  Uses pill box? Yes - weekly  Pt endorses 100% compliance - wasn't sure if she took it  We discussed: Benefits of medication synchronization, packaging and delivery as well as enhanced pharmacist oversight with [facility]. Patient decided to: Utilize [facility] pharmacy for medication synchronization, packaging and delivery  Care Plan and Follow Up Patient Decision:  Patient agrees to Care Plan and Follow-up.  Plan: Telephone follow up appointment with care management team member scheduled for:  4 months  [name], PharmD [place] Pharmacist [place] at [place] [phone]

## 2020-10-17 ENCOUNTER — Telehealth: Payer: Self-pay | Admitting: *Deleted

## 2020-10-17 DIAGNOSIS — I712 Thoracic aortic aneurysm, without rupture, unspecified: Secondary | ICD-10-CM

## 2020-10-17 DIAGNOSIS — Z0189 Encounter for other specified special examinations: Secondary | ICD-10-CM

## 2020-10-17 NOTE — Addendum Note (Signed)
Addended by: Nuala Alpha on: 10/17/2020 11:54 AM   Modules accepted: Orders

## 2020-10-17 NOTE — Telephone Encounter (Signed)
Spoke with Dr. Johney Frame on the phone about pt needing pre-med for upcoming MRA scheduled for 5/25.  Per Dr. Johney Frame, pt can use what she has on hand, and if she doesn't have this on hand, may call in Xanax 1 mg tablet--take 1 tablet (1 mg total) by mouth 1 hour prior to scan, dispense # 1 tablet with 0 refills.  Will await pt to call back to endorse this information.

## 2020-10-17 NOTE — Telephone Encounter (Signed)
cancel CT Angio and schedule MRA instead per [place] Received: Today Glenda Ly, LPN; P [facility] Pre Cert/Auth Schedule for [facility] 10-29-20 and will need precert  -----------------------------  Glenda Bauer) Need med for claustrophobic/ lab 5-18 / test 10-29-20 @ 5:00 will need to be there 1 hr before/ patient is aware you will call with instruction for med

## 2020-10-17 NOTE — Telephone Encounter (Signed)
RE: cancel CT Angio and schedule MRA instead per [place] Received: Today Glenda Miles, LPN; P Cv Div [facility]; Mack Guise B CT is canceled   Thanks  Lattie Haw

## 2020-10-17 NOTE — Telephone Encounter (Signed)
Noted in the pts chart that she has a prescription for Xanax to take as needed.  Pt can use this for pre-medication if she still has on hand.  Left a detailed message for the pt to call back, so we can discuss this more in detail.  Also I have notified Dr. Johney Frame about this matter, and will await for further advisement from her as well.

## 2020-10-17 NOTE — Telephone Encounter (Signed)
FW: Contrast CT Received: Therese Sarah, MD  Nuala Alpha, LPN Can we change these to MRA of the aorta...contrast shortage is causing huge issues.        Previous Messages   ----- Message -----  From: Fonda Kinder  Sent: 10/16/2020 11:51 AM EDT  To: Livingston Diones, RT, [name], MD  Subject: Contrast CT                    Hi Dr. Johney Frame   I am sure you have heard about the national shortage of IV contrast    We have been asked to reach out to providers to see about rescheduling or alternative imaging for outpatient exams.   You have an upcoming patient scheduled:    Glenda Bauer- MRN [id number] for a CT Angio Aorta on 11/10/20

## 2020-10-17 NOTE — Telephone Encounter (Signed)
Order for MRA of Chest w/wo contrast ordered in place of CT Angio chest aorta, due to contrast shortage.  This was advised to switch to this per Dr. Johney Frame.  Will send CT Scheduler and [place] a message to call the pt back and cancel CT Angio and schedule pt to have MRA done instead.

## 2020-10-17 NOTE — Telephone Encounter (Signed)
Checkout was able to schedule this pt for her MRA of Chest for 10/29/20 at 5 pm, and she will come for lab to check a BMET on 10/22/20.  Per Ivin Booty in checkout, pt did voice she is claustrophobic, and will require pre-med for this to have for her MRA.  Informed Ivin Booty that I will reach out to Dr. Johney Frame about this, and phone this into the pts pharmacy on file thereafter.  Ivin Booty verbalized understanding and agrees with this plan.  Ivin Booty will endorse this to the pt.

## 2020-10-20 NOTE — Telephone Encounter (Signed)
Patient ultrasound scheduled on 12/24/20

## 2020-10-22 ENCOUNTER — Other Ambulatory Visit: Payer: Medicare Other

## 2020-10-28 ENCOUNTER — Ambulatory Visit (INDEPENDENT_AMBULATORY_CARE_PROVIDER_SITE_OTHER): Payer: Medicare Other | Admitting: Adult Health

## 2020-10-28 ENCOUNTER — Other Ambulatory Visit: Payer: Self-pay | Admitting: Adult Health

## 2020-10-28 ENCOUNTER — Other Ambulatory Visit: Payer: Self-pay

## 2020-10-28 VITALS — HR 60 | Temp 99.0°F | Ht 66.0 in | Wt 181.0 lb

## 2020-10-28 DIAGNOSIS — U071 COVID-19: Secondary | ICD-10-CM | POA: Diagnosis not present

## 2020-10-28 DIAGNOSIS — C84A0 Cutaneous T-cell lymphoma, unspecified, unspecified site: Secondary | ICD-10-CM

## 2020-10-28 MED ORDER — MOLNUPIRAVIR 200 MG PO CAPS: 800.0000 mg | ORAL_CAPSULE | Freq: Two times a day (BID) | ORAL | 0 refills | Status: AC

## 2020-10-28 NOTE — Progress Notes (Signed)
Virtual Visit via Video Note  I connected with Glenda Bauer on 10/28/20 at  9:00 AM EDT by a video enabled telemedicine application and verified that I am speaking with the correct person using two identifiers.  Location patient: home Location provider:work or home office Persons participating in the virtual visit: patient, provider  I discussed the limitations of evaluation and management by telemedicine and the availability of in person appointments. The patient expressed understanding and agreed to proceed.   HPI:  70 year old female who is being evaluated today after finding out that she was positive for COVID-19.  Symptoms started roughly 5 days ago with sneezing and coughing.  Over the last 5 days her symptoms have progressed with mild shortness of breath with exertion, extreme fatigue, loose stool, continued coughing.  She has not had a fever for 2 days.  Due to symptoms she is interested in starting antiviral therapy.  ROS: See pertinent positives and negatives per HPI.  Past Medical History:  Diagnosis Date  . Allergy   . Anxiety   . Asthma   . Atrial fibrillation ([place])   . Cancer (HCC)    Melanoma and Mycosis Fungosis  . Cataract   . 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 Hipolito Bayley  . Depression   . GERD (gastroesophageal reflux disease)   . Heart murmur   . Hypertension   . Neuromuscular disorder ([place])   . Osteoarthritis   . Sebaceous cyst 2022   right vulva  . Thyroid nodule   . Urinary tract infection     Past Surgical History:  Procedure Laterality Date  . Cataracts Bilateral   . CHOLECYSTECTOMY  2015  . melenoma removal  1979  . Sinus Polyps       Family History  Problem Relation Age of Onset  . Arthritis Mother   . Ovarian cancer Mother        Metastatic  . Breast cancer Mother 40  . Thyroid disease Mother   . Parkinson's disease Father   . Crohn's disease Brother   . Schizophrenia Brother   .  Colon cancer Neg Hx   . Esophageal cancer Neg Hx   . Rectal cancer Neg Hx   . Stomach cancer Neg Hx        Current Outpatient Medications:  .  albuterol (PROVENTIL) (2.5 MG/3ML) 0.083% nebulizer solution, USE 1 VIAL VIA NEBULIZER EVERY 6 HOURS AS NEEDED FOR WHEEZING OR SHORTNESS OF BREATH, Disp: 225 mL, Rfl: 0 .  ALPRAZolam (XANAX) 0.25 MG tablet, TAKE 1 TO 2 TABLETS BY MOUTH DAILY AS NEEDED, Disp: 30 tablet, Rfl: 2 .  famotidine (PEPCID) 20 MG tablet, Take 20 mg by mouth daily., Disp: , Rfl:  .  flecainide (TAMBOCOR) 50 MG tablet, Take 50 mg by mouth 2 (two) times daily., Disp: , Rfl:  .  FLUoxetine (PROZAC) 20 MG capsule, TAKE 1 CAPSULE(20 MG) BY MOUTH DAILY, Disp: 90 capsule, Rfl: 1 .  fluticasone (FLONASE) 50 MCG/ACT nasal spray, Place 2 sprays into both nostrils as needed., Disp: , Rfl:  .  fluticasone furoate-vilanterol (BREO ELLIPTA) 200-25 MCG/INH AEPB, INHALE 1 PUFF INTO THE LUNGS DAILY, Disp: 180 each, Rfl: 3 .  levocetirizine (XYZAL) 5 MG tablet, Take 5 mg by mouth every evening., Disp: , Rfl:  .  lisinopril-hydrochlorothiazide (ZESTORETIC) 20-12.5 MG tablet, Take 2 tablets by mouth daily. (Patient taking differently: Take 1.5 tablets by mouth daily.), Disp: 180 tablet, Rfl: 2 .  metoprolol succinate (TOPROL-XL) 50  MG 24 hr tablet, Take 0.5 tablets (25 mg total) by mouth daily. Take with or immediately following a meal., Disp: 90 tablet, Rfl: 3 .  Molnupiravir 200 MG CAPS, Take 4 capsules (800 mg total) by mouth in the morning and at bedtime for 5 days., Disp: 40 capsule, Rfl: 0 .  montelukast (SINGULAIR) 10 MG tablet, Take 1 tablet (10 mg total) by mouth at bedtime., Disp: 90 tablet, Rfl: 3 .  Multiple Vitamin (MULTIVITAMIN) tablet, Take 1 tablet by mouth daily., Disp: , Rfl:  .  Multiple Vitamins-Minerals (VISION FORMULA PO), Take by mouth daily. , Disp: , Rfl:  .  PROAIR RESPICLICK 671 (90 Base) MCG/ACT AEPB, INHALE 2 PUFFS INTO THE LUNGS EVERY 6 HOURS AS NEEDED, Disp: 1 each,  Rfl: 2 .  Probiotic Product (ALIGN) 4 MG CAPS, Take by mouth daily., Disp: , Rfl:  .  simvastatin (ZOCOR) 10 MG tablet, TAKE 1 TABLET(10 MG) BY MOUTH AT BEDTIME, Disp: 90 tablet, Rfl: 1 .  triamcinolone cream (KENALOG) 0.1 %, Apply 1 application topically 2 (two) times daily., Disp: 453.6 g, Rfl: 1 .  XARELTO 20 MG TABS tablet, TAKE 1 TABLET(20 MG) BY MOUTH DAILY WITH SUPPER, Disp: 90 tablet, Rfl: 3  EXAM:  VITALS per patient if applicable:  GENERAL: alert, oriented, appears well and in no acute distress  HEENT: atraumatic, conjunttiva clear, no obvious abnormalities on inspection of external nose and ears  NECK: normal movements of the head and neck  LUNGS: on inspection no signs of respiratory distress, breathing rate appears normal, no obvious gross SOB, gasping or wheezing  CV: no obvious cyanosis  MS: moves all visible extremities without noticeable abnormality  PSYCH/NEURO: pleasant and cooperative, no obvious depression or anxiety, speech and thought processing grossly intact  ASSESSMENT AND PLAN:  Discussed the following assessment and plan:  COVID-19 virus infection - Plan: Molnupiravir 200 MG CAPS - Follow up if symptoms worsen  -Reviewed self quarantine instructions with the patient. -Side effects of medication reviewed    I discussed the assessment and treatment plan with the patient. The patient was provided an opportunity to ask questions and all were answered. The patient agreed with the plan and demonstrated an understanding of the instructions.   The patient was advised to call back or seek an in-person evaluation if the symptoms worsen or if the condition fails to improve as anticipated.   [name], NP

## 2020-10-29 ENCOUNTER — Ambulatory Visit (HOSPITAL_COMMUNITY): Admission: RE | Admit: 2020-10-29 | Payer: Medicare Other | Source: Ambulatory Visit

## 2020-10-29 NOTE — Telephone Encounter (Signed)
Pt was suppose to have MRA of Chest done today. Noted in pts appts that she cancelled for today and rescheduled her MRA to 11/12/20 at 4 pm, due to being covid positive.  Will await the pt to call back as needed for pre-meds, if her current supply on hand isn't available.

## 2020-10-30 ENCOUNTER — Telehealth: Payer: Self-pay

## 2020-10-30 ENCOUNTER — Other Ambulatory Visit: Payer: Self-pay

## 2020-10-30 DIAGNOSIS — Z0000 Encounter for general adult medical examination without abnormal findings: Secondary | ICD-10-CM

## 2020-10-30 DIAGNOSIS — I10 Essential (primary) hypertension: Secondary | ICD-10-CM

## 2020-10-30 DIAGNOSIS — I4891 Unspecified atrial fibrillation: Secondary | ICD-10-CM

## 2020-10-30 DIAGNOSIS — J4541 Moderate persistent asthma with (acute) exacerbation: Secondary | ICD-10-CM

## 2020-10-30 DIAGNOSIS — J4540 Moderate persistent asthma, uncomplicated: Secondary | ICD-10-CM

## 2020-10-30 MED ORDER — MONTELUKAST SODIUM 10 MG PO TABS: 10.0000 mg | ORAL_TABLET | Freq: Every day | ORAL | 3 refills | Status: DC

## 2020-10-30 MED ORDER — SIMVASTATIN 10 MG PO TABS
ORAL_TABLET | ORAL | 1 refills | Status: DC
Start: 2020-10-30 — End: 2020-11-14

## 2020-10-30 MED ORDER — RIVAROXABAN 20 MG PO TABS: ORAL_TABLET | ORAL | 3 refills | Status: DC

## 2020-10-30 MED ORDER — FLUOXETINE HCL 20 MG PO CAPS
ORAL_CAPSULE | ORAL | 1 refills | Status: DC
Start: 2020-10-30 — End: 2021-05-08

## 2020-10-30 NOTE — Telephone Encounter (Signed)
Noted this has been taking care of

## 2020-10-30 NOTE — Telephone Encounter (Signed)
-----   Message from Viona Gilmore, [facility] sent at 10/30/2020  3:54 PM EDT ----- Regarding: Refills Hi,  Ms. Hardrick is switching to [employer]. Can you please send new prescriptions to the pharmacy for the following medications? -fluoxetine -montelukast -simvastatin -Xarelto  Thank you! [name]

## 2020-10-30 NOTE — Patient Instructions (Addendum)
Hi Glenda Bauer,  It was great to get to meet you in person! Below is a summary of some of the topics we discussed.   Here is the phone number again for scheduling with Behavioral Health if you think that will be helpful: [phone].  Please reach out to me if you have any questions or need anything before our follow up!  Best, Glenda Bauer  Glenda Bauer, PharmD, [facility] [phone]  Visit Information  Goals Addressed            This Visit's Progress   . Manage My Medicine       Timeframe:  Short-Term Goal Priority:  High Start Date:                             Expected End Date:                       Follow Up Date 03/02/2021    - keep a list of all the medicines I take; vitamins and herbals too - use an alarm clock or phone to remind me to take my medicine    Why is this important?   . These steps will help you keep on track with your medicines.   Notes:       Patient Care Plan: CCM Pharmacy Care Plan    Problem Identified: Problem: Hypertension, Hyperlipidemia, Atrial Fibrillation, Asthma, Depression, Anxiety, Osteoarthritis and Allergic Rhinitis     Long-Range Goal: Patient-Specific Goal   Start Date: 10/15/2020  Expected End Date: 10/15/2021  This Visit's Progress: On track  Priority: High  Note:   Current Barriers:  . Unable to independently afford treatment regimen  Pharmacist Clinical Goal(s):  Marland Kitchen Patient will achieve adherence to monitoring guidelines and medication adherence to achieve therapeutic efficacy through collaboration with PharmD and provider.   Interventions: . 1:1 collaboration with [name], NP regarding development and update of comprehensive plan of care as evidenced by provider attestation and co-signature . Inter-disciplinary care team collaboration (see longitudinal plan of care) . Comprehensive medication review performed; medication list updated in electronic medical  record  Hypertension (BP goal <140/90) -Controlled -Current treatment: . Lisinopril - HCTZ 20-12.5 1.5 tablets daily (rx said 2 tablets daily) - every 2 hours going to the bathroom . Metoprolol succinate 50 mg 1/2 tablet daily -Medications previously tried: none  -Current home readings: 119/85, 114/83, 113/84, 113/81, 129/87, 109/77, 113/73, 104/73 - HR 69, 64, 58, 62, 64, 61, 58, 73 -Current dietary habits: cut down on salt and looking at sodium content; eats out about 50% of the time -Current exercise habits: walking dogs -Denies hypotensive/hypertensive symptoms -Educated on Exercise goal of 150 minutes per week; Importance of home blood pressure monitoring; Proper BP monitoring technique; -Counseled to monitor BP at home weekly, document, and provide log at future appointments -Counseled on diet and exercise extensively Recommended to continue current medication  Hyperlipidemia: (LDL goal < 100) -Uncontrolled -Current treatment: . Simvastatin 10 mg 1 tablet daily -Medications previously tried: none  -Current dietary patterns: eats out about 50% of the time; uses an air fryer -Current exercise habits: walking dogs -Educated on Cholesterol goals;  Benefits of statin for ASCVD risk reduction; Importance of limiting foods high in cholesterol; Exercise goal of 150 minutes per week; -Counseled on diet and exercise extensively Recommended to continue current medication Recommended repeat lipid panel  Atrial Fibrillation (Goal: prevent stroke and major  bleeding) -Controlled -CHADSVASC: 3 -Current treatment: . Rate/rhythm control: flecainide 50 mg twice daily, metoprolol succinate 50 mg 1 tablet daily . Anticoagulation: Xarelto 20 mg 1 tablet with supper  -Medications previously tried: none -Home BP and HR readings: provided above  -Counseled on increased risk of stroke due to Afib and benefits of anticoagulation for stroke prevention; importance of adherence to anticoagulant  exactly as prescribed; avoidance of NSAIDs due to increased bleeding risk with anticoagulants; -Counseled on diet and exercise extensively Recommended to continue current medication  Asthma (Goal: control symptoms) -Controlled -Current treatment  . Breo Ellipta 200-25 mcg/act inhale 1 puff daily . Albuterol nebulizer as needed . Proair respiclick 2 puffs as needed . Montelukast 10 mg 1 tablet daily -Medications previously tried: none -Exacerbations requiring treatment in last 6 months: none -Patient reports consistent use of maintenance inhaler -Frequency of rescue inhaler use: once a week (during walks) -Counseled on Proper inhaler technique; Benefits of consistent maintenance inhaler use When to use rescue inhaler Differences between maintenance and rescue inhalers -Recommended to continue current medication Assessed patient finances. Applied for [employer] patient assistance. Recommended switch to albuterol HFA to cut down on cost.  Depression/Anxiety (Goal: minimize symptoms) -Controlled -Current treatment: . Alprazolam 0.25 mg 1-2 tablets daily as needed  . Fluoxetine 20 mg 1 capsule daily -Medications previously tried/failed: citalopram (ineffective, fatigue) -PHQ9: 3 -GAD7: n/a -Connected with [facility] for mental health support -Educated on Benefits of medication for symptom control Benefits of cognitive-behavioral therapy with or without medication -Recommended to continue current medication  Allergic rhinitis (Goal: minimize symptoms) -Controlled -Current treatment  . Levocetirizine 5 mg 1/2 tablet every evening (twice a week) . Fluticasone 50 mcg/act 2 sprays daily as needed -Medications previously tried: none  -Recommended trial of Flonase sensimist if regular Flonase is causing nosebleeds   GERD (Goal: minimize symptoms) -Controlled -Current treatment  . Famotidine 20 mg 1 tablet daily -Medications previously tried: none  -Counseled on  non-pharmacologic management of symptoms such as elevating the head of your bed, avoiding eating 2-3 hours before bed, avoiding triggering foods such as acidic, spicy, or fatty foods, eating smaller meals, and wearing clothes that are loose around the waist  Health Maintenance -Vaccine gaps: shingles, tetanus, pneumonia ([facility]) -Current therapy:  . Multivitamin 1 tablet daily (gummy) . Vision vitamin daily  . Align probiotic daily  . Triamcinolone 0.1% cream twice daily as needed -Educated on Cost vs benefit of each product must be carefully weighed by individual consumer -Patient is satisfied with current therapy and denies issues -Recommended to continue current medication  Patient Goals/Self-Care Activities . Patient will:  - take medications as prescribed check blood pressure weekly, document, and provide at future appointments target a minimum of 150 minutes of moderate intensity exercise weekly  Follow Up Plan: Telephone follow up appointment with care management team member scheduled for: 4 months      Glenda Bauer was given information about Chronic Care Management services today including:  1. CCM service includes personalized support from designated clinical staff supervised by her physician, including individualized plan of care and coordination with other care providers 2. 24/7 contact phone numbers for assistance for urgent and routine care needs. 3. Standard insurance, coinsurance, copays and deductibles apply for chronic care management only during months in which we provide at least 20 minutes of these services. Most insurances cover these services at 100%, however patients may be responsible for any copay, coinsurance and/or deductible if applicable. This service may help you  avoid the need for more expensive face-to-face services. 4. Only one practitioner may furnish and bill the service in a calendar month. 5. The patient may stop CCM services at any time  (effective at the end of the month) by phone call to the office staff.  Patient agreed to services and verbal consent obtained.   The patient verbalized understanding of instructions, educational materials, and care plan provided today and agreed to receive a mailed copy of patient instructions, educational materials, and care plan.  Telephone follow up appointment with pharmacy team member scheduled for: 4 months  Viona Gilmore, [facility]  High Cholesterol  High cholesterol is a condition in which the blood has high levels of a white, waxy substance similar to fat (cholesterol). The liver makes all the cholesterol that the body needs. The human body needs small amounts of cholesterol to help build cells. A person gets extra or excess cholesterol from the food that he or she eats. The blood carries cholesterol from the liver to the rest of the body. If you have high cholesterol, deposits (plaques) may build up on the walls of your arteries. Arteries are the blood vessels that carry blood away from your heart. These plaques make the arteries narrow and stiff. Cholesterol plaques increase your risk for heart attack and stroke. Work with your health care provider to keep your cholesterol levels in a healthy range. What increases the risk? The following factors may make you more likely to develop this condition:  Eating foods that are high in animal fat (saturated fat) or cholesterol.  Being overweight.  Not getting enough exercise.  A family history of high cholesterol (familial hypercholesterolemia).  Use of tobacco products.  Having diabetes. What are the signs or symptoms? There are no symptoms of this condition. How is this diagnosed? This condition may be diagnosed based on the results of a blood test.  If you are older than 72 years of age, your health care provider may check your cholesterol levels every 4-6 years.  You may be checked more often if you have high cholesterol or  other risk factors for heart disease. The blood test for cholesterol measures:  "Bad" cholesterol, or LDL cholesterol. This is the main type of cholesterol that causes heart disease. The desired level is less than 100 mg/dL.  "Good" cholesterol, or HDL cholesterol. HDL helps protect against heart disease by cleaning the arteries and carrying the LDL to the liver for processing. The desired level for HDL is 60 mg/dL or higher.  Triglycerides. These are fats that your body can store or burn for energy. The desired level is less than 150 mg/dL.  Total cholesterol. This measures the total amount of cholesterol in your blood and includes LDL, HDL, and triglycerides. The desired level is less than 200 mg/dL. How is this treated? This condition may be treated with:  Diet changes. You may be asked to eat foods that have more fiber and less saturated fats or added sugar.  Lifestyle changes. These may include regular exercise, maintaining a healthy weight, and quitting use of tobacco products.  Medicines. These are given when diet and lifestyle changes have not worked. You may be prescribed a statin medicine to help lower your cholesterol levels. Follow these instructions at home: Eating and drinking  Eat a healthy, balanced diet. This diet includes: ? Daily servings of a variety of fresh, frozen, or canned fruits and vegetables. ? Daily servings of whole grain foods that are rich in fiber. ?  Foods that are low in saturated fats and trans fats. These include poultry and fish without skin, lean cuts of meat, and low-fat dairy products. ? A variety of fish, especially oily fish that contain omega-3 fatty acids. Aim to eat fish at least 2 times a week.  Avoid foods and drinks that have added sugar.  Use healthy cooking methods, such as roasting, grilling, broiling, baking, poaching, steaming, and stir-frying. Do not fry your food except for stir-frying.   Lifestyle  Get regular exercise. Aim to  exercise for a total of 150 minutes a week. Increase your activity level by doing activities such as gardening, walking, and taking the stairs.  Do not use any products that contain nicotine or tobacco, such as cigarettes, e-cigarettes, and chewing tobacco. If you need help quitting, ask your health care provider.   General instructions  Take over-the-counter and prescription medicines only as told by your health care provider.  Keep all follow-up visits as told by your health care provider. This is important. Where to find more information  American Heart Association: www.heart.org  National Heart, Lung, and Blood Institute: https://wilson-eaton.com/ Contact a health care provider if:  You have trouble achieving or maintaining a healthy diet or weight.  You are starting an exercise program.  You are unable to stop smoking. Get help right away if:  You have chest pain.  You have trouble breathing.  You have any symptoms of a stroke. "BE FAST" is an easy way to remember the main warning signs of a stroke: ? B - Balance. Signs are dizziness, sudden trouble walking, or loss of balance. ? E - Eyes. Signs are trouble seeing or a sudden change in vision. ? F - Face. Signs are sudden weakness or numbness of the face, or the face or eyelid drooping on one side. ? A - Arms. Signs are weakness or numbness in an arm. This happens suddenly and usually on one side of the body. ? S - Speech. Signs are sudden trouble speaking, slurred speech, or trouble understanding what people say. ? T - Time. Time to call emergency services. Write down what time symptoms started.  You have other signs of a stroke, such as: ? A sudden, severe headache with no known cause. ? Nausea or vomiting. ? Seizure. These symptoms may represent a serious problem that is an emergency. Do not wait to see if the symptoms will go away. Get medical help right away. Call your local emergency services (911 in the [place] Do not drive  yourself to the hospital. Summary  Cholesterol plaques increase your risk for heart attack and stroke. Work with your health care provider to keep your cholesterol levels in a healthy range.  Eat a healthy, balanced diet, get regular exercise, and maintain a healthy weight.  Do not use any products that contain nicotine or tobacco, such as cigarettes, e-cigarettes, and chewing tobacco.  Get help right away if you have any symptoms of a stroke. This information is not intended to replace advice given to you by your health care provider. Make sure you discuss any questions you have with your health care provider. Document Revised: 04/23/2019 Document Reviewed: 04/23/2019 Elsevier Patient Education  2021 [employer].

## 2020-10-31 ENCOUNTER — Other Ambulatory Visit: Payer: Self-pay | Admitting: Adult Health

## 2020-10-31 MED ORDER — ALBUTEROL SULFATE HFA 108 (90 BASE) MCG/ACT IN AERS: 2.0000 | INHALATION_SPRAY | Freq: Four times a day (QID) | RESPIRATORY_TRACT | 2 refills | Status: DC | PRN

## 2020-11-07 ENCOUNTER — Telehealth: Payer: Self-pay | Admitting: Pharmacist

## 2020-11-07 ENCOUNTER — Telehealth: Payer: Self-pay | Admitting: Cardiology

## 2020-11-07 DIAGNOSIS — I480 Paroxysmal atrial fibrillation: Secondary | ICD-10-CM

## 2020-11-07 DIAGNOSIS — Z0000 Encounter for general adult medical examination without abnormal findings: Secondary | ICD-10-CM

## 2020-11-07 DIAGNOSIS — I10 Essential (primary) hypertension: Secondary | ICD-10-CM

## 2020-11-07 DIAGNOSIS — J4540 Moderate persistent asthma, uncomplicated: Secondary | ICD-10-CM

## 2020-11-07 MED ORDER — LISINOPRIL-HYDROCHLOROTHIAZIDE 20-12.5 MG PO TABS
2.0000 | ORAL_TABLET | Freq: Every day | ORAL | 2 refills | Status: DC
Start: 2020-11-07 — End: 2021-11-05

## 2020-11-07 MED ORDER — METOPROLOL SUCCINATE ER 25 MG PO TB24: 25.0000 mg | ORAL_TABLET | Freq: Every day | ORAL | 3 refills | Status: DC

## 2020-11-07 MED ORDER — FLECAINIDE ACETATE 50 MG PO TABS
50.0000 mg | ORAL_TABLET | Freq: Two times a day (BID) | ORAL | 5 refills | Status: DC
Start: 2020-11-07 — End: 2021-08-10

## 2020-11-07 NOTE — Telephone Encounter (Signed)
Pt c/o medication issue:  1. Name of Medication: metoprolol succinate (TOPROL-XL) 50 MG 24 hr tablet  lisinopril-hydrochlorothiazide (ZESTORETIC) 20-12.5 MG tablet     2. How are you currently taking this medication (dosage and times per day)? unknown  3. Are you having a reaction (difficulty breathing--STAT)? No   4. What is your medication issue? Chelsea from [employer] is calling with some concerns about this medication.She is requesting a callback to discuss the issue for the patient

## 2020-11-07 NOTE — Telephone Encounter (Signed)
Spoke with Vikki Ports and she states pt is switching to [facility].  Need Flecainide, Metoprolol and Lisinopril/HCTZ scripts sent to them.  Advised I will get those sent over.

## 2020-11-07 NOTE — Chronic Care Management (AMB) (Signed)
    Chronic Care Management Pharmacy Assistant   Name: Glenda Bauer  MRN: [id number] DOB: 01-Nov-1950  Spoke with Glenda Bauer at [facility] upon call back regarding Glenda Bauer the clinical pharmacist request to have patients metoprolol, flecainide and lisinopril-HCTZ sent over to [facility]. Also, to have updated prescriptions sent for the lsiniopril-hydrochlorothiazide and the metoprolol to reflect what patient is taking.  Patient reported per Glenda Bauer that she is taking only a half a tablet of the lisinopril-HCTZ and to please send metoprolol 35mg  tablets instead of having patient half the metoprolol 50mg  tablets due to having trouble breaking them. Glenda Bauer stated that she will send these prescriptions to [facility] and with the updates.   [place]  Clinical Pharmacist Assistant [phone]

## 2020-11-07 NOTE — Telephone Encounter (Signed)
[facility] triage patient.  Thanks!

## 2020-11-10 ENCOUNTER — Inpatient Hospital Stay: Admission: RE | Admit: 2020-11-10 | Payer: Medicare Other | Source: Ambulatory Visit

## 2020-11-10 DIAGNOSIS — Z961 Presence of intraocular lens: Secondary | ICD-10-CM | POA: Diagnosis not present

## 2020-11-10 DIAGNOSIS — H43813 Vitreous degeneration, bilateral: Secondary | ICD-10-CM | POA: Diagnosis not present

## 2020-11-10 DIAGNOSIS — H35443 Age-related reticular degeneration of retina, bilateral: Secondary | ICD-10-CM | POA: Diagnosis not present

## 2020-11-10 DIAGNOSIS — H16223 Keratoconjunctivitis sicca, not specified as Sjogren's, bilateral: Secondary | ICD-10-CM | POA: Diagnosis not present

## 2020-11-11 NOTE — Progress Notes (Signed)
Cardiology Office Note:    Date:  11/14/2020   ID:  Glenda Bauer, DOB 19-Mar-1951, MRN [id number]  PCP:  [name], NP  [facility] Cardiologist:  [name], MD  [facility] Electrophysiologist:  None   Referring MD: [name], NP    History of Present Illness:    Glenda Bauer is a 70 y.o. female with a hx of HTN, cutaneous T-cell lymphoma, ascending aortic aneurysm, and atrial fibrillation managed by Afib clinic who presents to clinic for CV follow-up.  The patient was initially diagnosed with atrial fibrillation 03/27/20 at her routine PCP visit. She does admit that she had been having more frequent palpitations for a few months prior but these symptoms were brief. ECG showed afib HR 137. Patient was started on Xarelto for a CHADS2VASC score of 3 and metoprolol for rate control. She denies any significant snoring or alcohol use.   She has been followed by Afib clinic and was initiated on flecainide with return to NSR. Her blood pressures were elevated at home and her lisinopril-HCTZ were resumed. She now presents for follow-up.  During last visit on 05/13/20 the patient was doing well and had resumed exercise. We obtained a MRA of her aorta due to dilation noted on TTE which demonstrated ascending aorta 59mm.   Today, the patient states that she is tired as she is working 6 days per week. She is not walking as much, but she is walking the dog and is able to walk about 1 mile before getting SOB. No exertional chest pain. Blood pressures are well controlled at home. Tolerating xarelto with occasional bruising and nosebleeds but they do not sustain. No palpitations or recurrence of Afib that she knows of. No LE edema, orthopnea, or PND. Had dizziness initially with change of BP medications but this has resolved. Motivated to get back into pilates.    Past Medical History:  Diagnosis Date   Allergy    Anxiety    Asthma    Atrial fibrillation ([place])     Cancer (HCC)    Melanoma and Mycosis Fungosis   Cataract    Cutaneous T-cell lymphoma ([place]) 2011   Dermatologist treated in [place]; Dr. Hipolito Bayley   Depression    GERD (gastroesophageal reflux disease)    Heart murmur    Hypertension    Neuromuscular disorder (HCC)    Osteoarthritis    Sebaceous cyst 2022   right vulva   Thyroid nodule    Urinary tract infection     Past Surgical History:  Procedure Laterality Date   Cataracts Bilateral    CHOLECYSTECTOMY  2015   melenoma removal  1979   Sinus Polyps       Current Medications: Current Meds  Medication Sig   albuterol (PROVENTIL) (2.5 MG/3ML) 0.083% nebulizer solution USE 1 VIAL VIA NEBULIZER EVERY 6 HOURS AS NEEDED FOR WHEEZING OR SHORTNESS OF BREATH   albuterol (VENTOLIN HFA) 108 (90 Base) MCG/ACT inhaler Inhale 2 puffs into the lungs every 6 (six) hours as needed for wheezing or shortness of breath.   ALPRAZolam (XANAX) 0.25 MG tablet TAKE 1 TO 2 TABLETS BY MOUTH DAILY AS NEEDED   famotidine (PEPCID) 20 MG tablet Take 20 mg by mouth daily.   flecainide (TAMBOCOR) 50 MG tablet Take 1 tablet (50 mg total) by mouth 2 (two) times daily.   FLUoxetine (PROZAC) 20 MG capsule TAKE 1 CAPSULE(20 MG) BY MOUTH DAILY   fluticasone (FLONASE) 50 MCG/ACT nasal spray Place 2 sprays into  both nostrils as needed.   fluticasone furoate-vilanterol (BREO ELLIPTA) 200-25 MCG/INH AEPB INHALE 1 PUFF INTO THE LUNGS DAILY   levocetirizine (XYZAL) 5 MG tablet Take 5 mg by mouth every evening.   lisinopril-hydrochlorothiazide (ZESTORETIC) 20-12.5 MG tablet Take 2 tablets by mouth daily.   metoprolol succinate (TOPROL-XL) 25 MG 24 hr tablet Take 1 tablet (25 mg total) by mouth daily. Take with or immediately following a meal.   montelukast (SINGULAIR) 10 MG tablet Take 1 tablet (10 mg total) by mouth at bedtime.   Multiple Vitamin (MULTIVITAMIN) tablet Take 1 tablet by mouth daily.   Multiple Vitamins-Minerals (VISION FORMULA PO) Take by mouth  daily.    Probiotic Product (ALIGN) 4 MG CAPS Take by mouth daily.   rivaroxaban (XARELTO) 20 MG TABS tablet TAKE 1 TABLET(20 MG) BY MOUTH DAILY WITH SUPPER   rosuvastatin (CRESTOR) 10 MG tablet Take 1 tablet (10 mg total) by mouth daily.   triamcinolone cream (KENALOG) 0.1 % APPLY TOPICALLY TWICE DAILY   [DISCONTINUED] simvastatin (ZOCOR) 10 MG tablet TAKE 1 TABLET(10 MG) BY MOUTH AT BEDTIME     Allergies:   Patient has no known allergies.   Social History   Socioeconomic History   Marital status: Divorced    Spouse name: Not on file   Number of children: 3   Years of education: 16   Highest education level: Associate degree: academic program  Occupational History   Occupation: Medical sales representative   Tobacco Use   Smoking status: Former    Packs/day: 0.25    Years: 10.00    Pack years: 2.50    Types: Cigarettes   Smokeless tobacco: Never   Tobacco comments:    35 years ago; smoke socially  Scientific laboratory technician Use: Never used  Substance and Sexual Activity   Alcohol use: No    Alcohol/week: 0.0 standard drinks   Drug use: No   Sexual activity: Not Currently    Birth control/protection: Post-menopausal  Other Topics Concern   Not on file  Social History Narrative   Separated    Three children ( one lives locally)    Social Determinants of Health   Financial Resource Strain: Low Risk    Difficulty of Paying Living Expenses: Not hard at all  Food Insecurity: No Food Insecurity   Worried About Charity fundraiser in the Last Year: Never true   Arboriculturist in the Last Year: Never true  Transportation Needs: No Transportation Needs   Lack of Transportation (Medical): No   Lack of Transportation (Non-Medical): No  Physical Activity: Insufficiently Active   Days of Exercise per Week: 5 days   Minutes of Exercise per Session: 20 min  Stress: Stress Concern Present   Feeling of Stress : Rather much  Social Connections: Moderately Isolated   Frequency of Communication with  Friends and Family: More than three times a week   Frequency of Social Gatherings with Friends and Family: Twice a week   Attends Religious Services: Never   Marine scientist or Organizations: No   Attends Music therapist: 1 to 4 times per year   Marital Status: Divorced     Family History: The patient's family history includes Arthritis in her mother; Breast cancer (age of onset: 46) in her mother; Crohn's disease in her brother; Ovarian cancer in her mother; Parkinson's disease in her father; Schizophrenia in her brother; Thyroid disease in her mother. There is no history of Colon cancer, Esophageal  cancer, Rectal cancer, or Stomach cancer.  ROS:   Please see the history of present illness.    Review of Systems  Constitutional:  Positive for malaise/fatigue. Negative for chills and fever.  HENT:  Positive for nosebleeds. Negative for sinus pain.   Eyes:  Negative for blurred vision.  Respiratory:  Positive for shortness of breath. Negative for sputum production.   Cardiovascular:  Negative for chest pain, palpitations, orthopnea, claudication, leg swelling and PND.  Gastrointestinal:  Negative for blood in stool, heartburn, melena, nausea and vomiting.  Genitourinary:  Negative for hematuria.  Musculoskeletal:  Positive for myalgias.  Neurological:  Negative for dizziness and loss of consciousness.  Endo/Heme/Allergies:  Bruises/bleeds easily.  Psychiatric/Behavioral:  Negative for substance abuse.    EKGs/Labs/Other Studies Reviewed:    The following studies were reviewed today: MRA of aorta 11/13/20: FINDINGS: VASCULAR   Aorta: Tortuous thoracic aorta with type 3 arch. No pedunculated plaque. No periaortic fluid. No dissection.   The estimated diameter of the ascending aorta is 45 mm. No significant atherosclerotic changes.   Heart: Heart size within normal limits.  No pericardial fluid.   Pulmonary Arteries: Flow signal of the pulmonary  arteries unremarkable. Main pulmonary artery measures 32 mm.   NON-VASCULAR   Pleura/lungs: No pleural fluid.   Nodular changes at the bilateral lung apices, pleuroparenchymal interface, poorly characterized on MR.   No confluent airspace disease.   Mediastinum: No adenopathy. Unremarkable thoracic inlet. Hiatal hernia.   Musculoskeletal: Relatively uniform signal within the visualized spinal vertebral bodies. No narrowing of the spinal canal.   Unremarkable appearance of the chest wall with no adenopathy.   Upper abdomen: Unremarkable   IMPRESSION: Estimated maximum diameter of the ascending aorta 45 mm. Recommend semi-annual imaging followup by CTA or MRA and referral to cardiothoracic surgery if not already obtained. This recommendation follows 2010 ACCF/AHA/AATS/ACR/ASA/SCA/SCAI/SIR/STS/SVM Guidelines for the Diagnosis and Management of Patients With Thoracic Aortic Disease. Circulation. 2010; 121: H631-S970. Aortic aneurysm NOS (ICD10-I71.9)   Lung nodularity at the bilateral lung apices, poorly characterized by MR. If MR surveillance of the aorta is elected, suggest at least chest x-ray documentation of what is presumably benign scarring at the lung apices. Otherwise, this could be characterized on surveillance CT angiogram.  TTE 04/08/20: IMPRESSIONS   1. Left ventricular ejection fraction, by estimation, is 50 to 55%. The  left ventricle has low normal function. The left ventricle has no regional  wall motion abnormalities. Left ventricular diastolic parameters are  indeterminate.   2. Right ventricular systolic function is normal. The right ventricular  size is normal. There is normal pulmonary artery systolic pressure.   3. The mitral valve is normal in structure. Mild to moderate mitral valve  regurgitation. No evidence of mitral stenosis.   4. The aortic valve is normal in structure. Aortic valve regurgitation is  mild to moderate. No aortic stenosis is  present.   5. Aortic dilatation noted. There is mild to moderate dilatation of the  ascending aorta, measuring 44 mm.  EKG:  EKG is  ordered today.  The ekg ordered today demonstrates sinus bradycardia with HR 57. Normal intervals. Normal QRS.  Recent Labs: 03/27/2020: ALT 20; Hemoglobin 16.7; Platelets 243; TSH 1.26 11/12/2020: BUN 16; Creatinine, Ser 0.92; Potassium 3.6; Sodium 141  Recent Lipid Panel    Component Value Date/Time   CHOL 208 (H) 03/27/2020 0917   TRIG 100 03/27/2020 0917   HDL 77 03/27/2020 0917   CHOLHDL 2.7 03/27/2020 0917   VLDL 14.0  02/06/2019 0748   LDLCALC 110 (H) 03/27/2020 0917     Risk Assessment/Calculations:    CHA2DS2-VASc Score = 3  This indicates a 3.2% annual risk of stroke. The patient's score is based upon: CHF History: No HTN History: Yes Diabetes History: No Stroke History: No Vascular Disease History: No Age Score: 1 Gender Score: 1    Physical Exam:    VS:  BP 126/64   Pulse 64   Ht 5\' 6"  (1.676 m)   Wt 182 lb (82.6 kg)   LMP 06/07/2000 (Approximate)   BMI 29.38 kg/m     Wt Readings from Last 3 Encounters:  11/14/20 182 lb (82.6 kg)  10/28/20 181 lb (82.1 kg)  10/01/20 181 lb (82.1 kg)     GEN:  Comfortable, NAD HEENT: Normal NECK: No JVD; No carotid bruits CARDIAC: RRR, 2/6 systolic murmur. No rubs or gallops RESPIRATORY:  Clear to auscultation without rales, wheezing or rhonchi  ABDOMEN: Soft, non-tender, non-distended MUSCULOSKELETAL:  No edema; No deformity  SKIN: Warm and dry NEUROLOGIC:  Alert and oriented x 3 PSYCHIATRIC:  Normal affect   ASSESSMENT:    1. Paroxysmal atrial fibrillation (HCC)   2. Thoracic aortic aneurysm without rupture ([place])   3. Encounter for laboratory examination   4. Dilation of aorta (HCC)   5. Essential hypertension   6. Pulmonary nodules   7. Hyperlipidemia, unspecified hyperlipidemia type   8. Medication management   9. Nonrheumatic mitral valve regurgitation    PLAN:     In order of problems listed above:  #Paroxysmal Afib: CHADs-vasc 3. Now back in rhythm since starting flecainide. On xarelto for [facility] which she is tolerating well. Followed by Afib clinic. -Continue flecainide 50mg  BID -Continue xarelto for [facility] -Continue metop 25mg  XL -Follow-up with Afib clinic as scheduled  #Mild-to-moderate ascending aortic dilation Measures 7mm on TTE and 17mm on MRA chest 11/12/20. -Refer to CT surgery for  serial monitoring every 6 months  -Aggressive blood pressure control -Avoid heavy lifting >30lbs; discussed with the patient -Okay for weight bearing strength exercises like pilates  #Mild-to-moderate MR: -Serial TTEs every 2-3 years  #HTN Well controlled at 120/70s. Initial orthostatic symptoms that has since resolved.  -Continue lisinopril-HTCZ 20mg -12.5mg  (1.5 pills per day) -Continue metop 25mg  XL  #HLD: Managed by PCP. Goal LDL <70 given aortic dilation -Change simva to crestor 10mg  daily -Repeat cholesterol in 8 weeks  #Lung Nodularity Noted on MRA: -Check CXR to further evaluate  Medication Adjustments/Labs and Tests Ordered: Current medicines are reviewed at length with the patient today.  Concerns regarding medicines are outlined above.  Orders Placed This Encounter  Procedures   MR Angiogram Chest W Wo Contrast   DG Chest 2 View   Lipid Profile   Basic metabolic panel   Ambulatory referral to Cardiothoracic Surgery    Meds ordered this encounter  Medications   rosuvastatin (CRESTOR) 10 MG tablet    Sig: Take 1 tablet (10 mg total) by mouth daily.    Dispense:  90 tablet    Refill:  2    D/C'ed simvastatin and started this     Patient Instructions  Medication Instructions:   STOP TAKING SIMVASTATIN NOW  START TAKING ROSUVASTATIN (CRESTOR) 10 MG BY MOUTH DAILY  *If you need a refill on your cardiac medications before your next appointment, please call your pharmacy*   Lab Work:  IN 8 WEEKS--LIPIDS--PLEASE COME FASTING  TO THIS LAB APPOINTMENT  If you have labs (blood work) drawn today and  your tests are completely normal, you will receive your results only by: MyChart Message (if you have MyChart) OR A paper copy in the mail If you have any lab test that is abnormal or we need to change your treatment, we will call you to review the results.  You have been referred to [facility]   Testing/Procedures:  A chest x-ray takes a picture of the organs and structures inside the chest, including the heart, lungs, and blood vessels. This test can show several things, including, whether the heart is enlarges; whether fluid is building up in the lungs; and whether pacemaker / defibrillator leads are still in place.   TO BE DONE AT [facility]  MRA OF THE CHEST W/WO CONTRAST--NEEDS TO BE SCHEDULED TO HAVE DONE IN 6 MONTHS PER DR. Johney Frame TO FOLLOW DILATED AORTA   Follow-Up:  6 MONTHS IN THE OFFICE WITH DR. Dewayne Hatch NEEDS TO BE DONE BEFORE THIS VISIT     Signed, [name], MD  11/14/2020 9:14 AM    [place]

## 2020-11-12 ENCOUNTER — Other Ambulatory Visit: Payer: Self-pay | Admitting: Cardiology

## 2020-11-12 ENCOUNTER — Ambulatory Visit (HOSPITAL_COMMUNITY)
Admission: RE | Admit: 2020-11-12 | Discharge: 2020-11-12 | Disposition: A | Discharge status: Home / Self Care | Payer: Medicare Other | Source: Ambulatory Visit | Attending: Cardiology | Admitting: Cardiology

## 2020-11-12 ENCOUNTER — Other Ambulatory Visit: Payer: Self-pay

## 2020-11-12 DIAGNOSIS — I712 Thoracic aortic aneurysm, without rupture, unspecified: Secondary | ICD-10-CM

## 2020-11-12 DIAGNOSIS — Z0189 Encounter for other specified special examinations: Secondary | ICD-10-CM

## 2020-11-12 DIAGNOSIS — K449 Diaphragmatic hernia without obstruction or gangrene: Secondary | ICD-10-CM | POA: Diagnosis not present

## 2020-11-12 DIAGNOSIS — R918 Other nonspecific abnormal finding of lung field: Secondary | ICD-10-CM | POA: Diagnosis not present

## 2020-11-12 LAB — BASIC METABOLIC PANEL
BUN/Creatinine Ratio: 17 (ref 12–28)
BUN: 16 mg/dL (ref 8–27)
CO2: 21 mmol/L (ref 20–29)
Calcium: 8.8 mg/dL (ref 8.7–10.3)
Chloride: 104 mmol/L (ref 96–106)
Creatinine, Ser: 0.92 mg/dL (ref 0.57–1.00)
Glucose: 122 mg/dL — ABNORMAL HIGH (ref 65–99)
Potassium: 3.6 mmol/L (ref 3.5–5.2)
Sodium: 141 mmol/L (ref 134–144)
eGFR: 67 mL/min/{1.73_m2} (ref >59)

## 2020-11-12 IMAGING — MR MR MRA CHEST W/ OR W/O CM
15 series · 16 of 16 positions shown · IV contrast (gadavist)
Comparison: None.

CLINICAL DATA: 69-year-old female with a history of thoracic aortic
aneurysm

EXAM:
MRA CHEST WITH OR WITHOUT CONTRAST
TECHNIQUE: Angiographic images of the chest were obtained using MRA technique
without and with intravenous contrast.
CONTRAST:  8mL GADAVIST GADOBUTROL 1 MMOL/ML IV SOLN

[Series 2: t2_trufi_tra_p2_bh · axial · 8.0mm · 0.62mm/px · 1 of 20 slices shown]
[im 1/20]
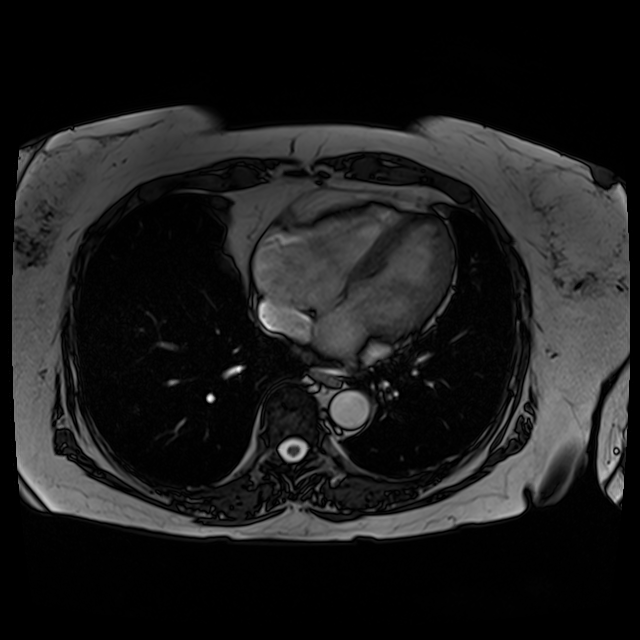

[Series 3: axial_db_haste_loc · axial · 7.0mm · 1.48mm/px · 1 of 25 slices shown]
[im 1/25]
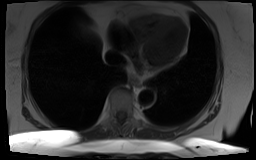

[Series 4: (person_name)_(person_name)_(person_name) · sagittal · 8.0mm · 1.79mm/px · 1 of 30 slices shown (1 of 5)]
[im 1/30]
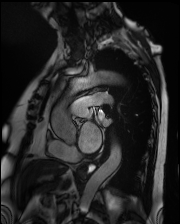

[Series 4: (person_name)_(person_name)_(person_name) · sagittal · 8.0mm · 1.79mm/px · 1 of 30 slices shown (2 of 5)]
[im 1/30]
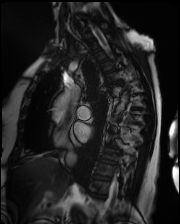

[Series 4: (person_name)_(person_name)_(person_name) · sagittal · 8.0mm · 1.79mm/px · 1 of 30 slices shown (3 of 5)]
[im 1/30]
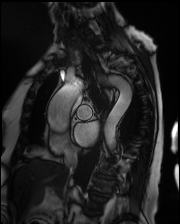

[Series 4: (person_name)_(person_name)_(person_name) · sagittal · 8.0mm · 1.79mm/px · 1 of 30 slices shown (4 of 5)]
[im 1/30]
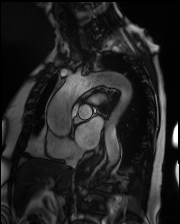

[Series 4: (person_name)_(person_name)_(person_name) · sagittal · 8.0mm · 1.79mm/px · 1 of 30 slices shown (5 of 5)]
[im 1/30]
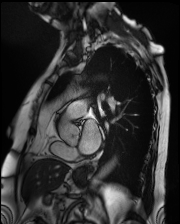

[Series 5: T1 dynamic · axial · non-contrast · 3.3mm · 1.18mm/px · 1 of 80 slices shown]
[im 1/80]
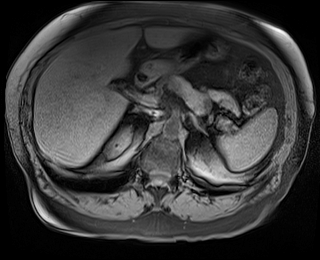

[Series 7: angio_fl3d_sag_pre · sagittal · 1.1mm · 1.17mm/px · 1 of 112 slices shown]
[im 1/112]
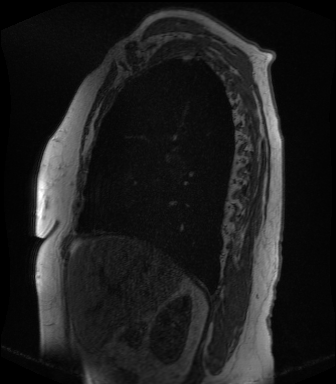

[Series 9: candy cane ce-arterial · sagittal · arterial · 1.1mm · 1.17mm/px · 1 of 112 slices shown]
[im 1/112]
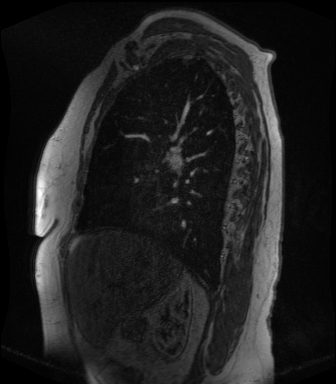

[Series 10: candy cane ce-arterial_sub · sagittal · 1.1mm · 1.17mm/px · 1 of 108 slices shown]
[im 1/108]
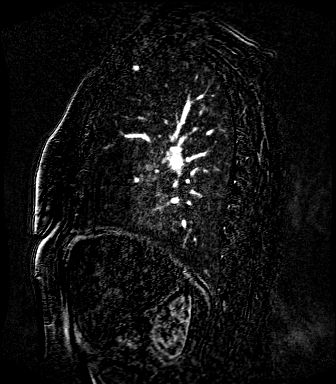

[Series 12: candy cane ce-venous · sagittal · portal-venous · 1.1mm · 1.17mm/px · 1 of 112 slices shown]
[im 1/112]
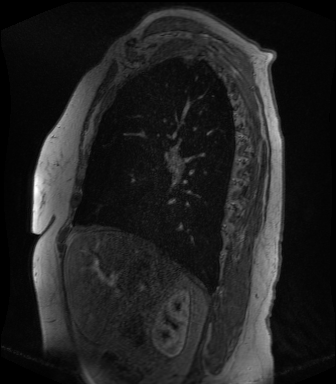

[Series 13: candy cane ce-venous_sub · sagittal · 1.1mm · 1.17mm/px · 2 of 112 slices shown]
[im 1/112]
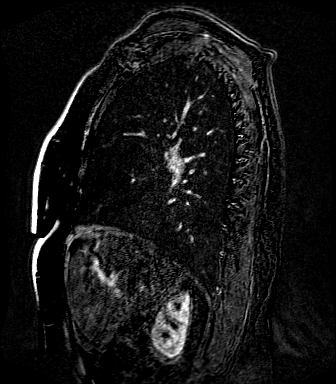
[im 112/112]
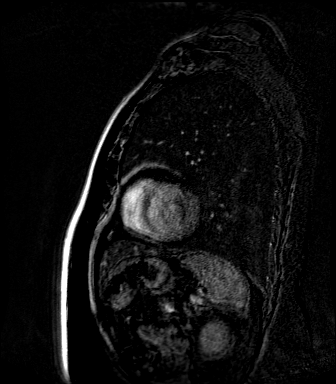

[Series 15: T1 dynamic post-contrast · axial · 3.3mm · 1.18mm/px · 1 of 80 slices shown]
[im 1/80]
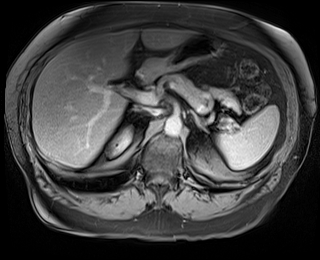

[Series 1027: mip tumble · axial · 1.1mm · 0.46mm/px · 1 of 5 slices shown]
[im 1/5]
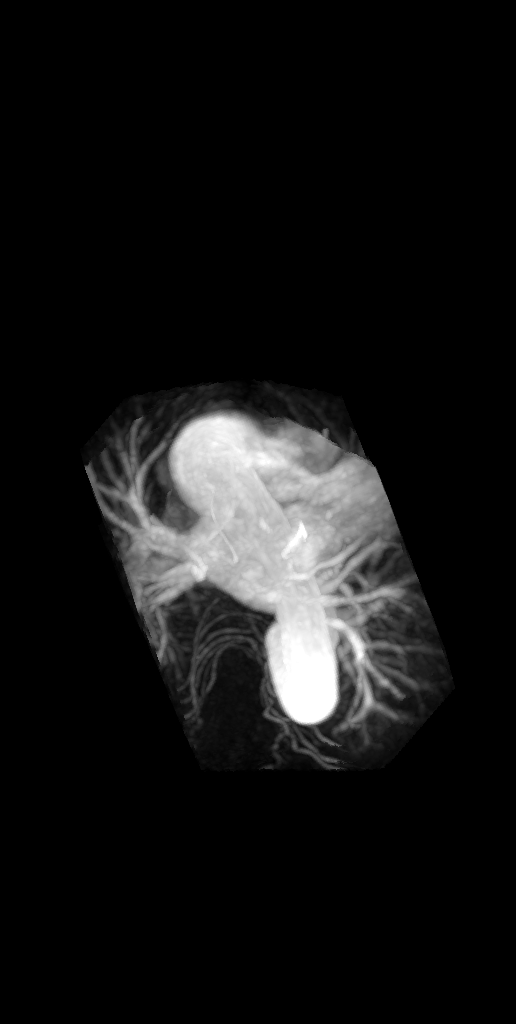

[16 of 16 positions shown; findings below may reference images not displayed]

FINDINGS: VASCULAR

Aorta: Tortuous thoracic aorta with type 3 arch. No pedunculated
plaque. No periaortic fluid. No dissection.

The estimated diameter of the ascending aorta is 45 mm. No
significant atherosclerotic changes.

Heart: Heart size within normal limits.  No pericardial fluid.

Pulmonary Arteries: Flow signal of the pulmonary arteries
unremarkable. Main pulmonary artery measures 32 mm.

NON-VASCULAR

Pleura/lungs: No pleural fluid.

Nodular changes at the bilateral lung apices, pleuroparenchymal
interface, poorly characterized on MR.

No confluent airspace disease.

Mediastinum: No adenopathy. Unremarkable thoracic inlet. Hiatal
hernia.

Musculoskeletal: Relatively uniform signal within the visualized
spinal vertebral bodies. No narrowing of the spinal canal.

Unremarkable appearance of the chest wall with no adenopathy.

Upper abdomen: Unremarkable
IMPRESSION: Estimated maximum diameter of the ascending aorta 45 mm. Recommend
semi-annual imaging followup by CTA or MRA and referral to
cardiothoracic surgery if not already obtained. This recommendation
follows [7U] ACCF/AHA/AATS/ACR/ASA/SCA/GIORGI/GIORGI/GIORGI/GIORGI Guidelines
for the Diagnosis and Management of Patients With Thoracic Aortic
Disease. Circulation. [7U]; 121: E266-e369. Aortic aneurysm NOS
([7U]-[7U])

Lung nodularity at the bilateral lung apices, poorly characterized
by MR. If MR surveillance of the aorta is elected, suggest at least
chest x-ray documentation of what is presumably benign scarring at
the lung apices. Otherwise, this could be characterized on
surveillance CT angiogram.

## 2020-11-12 MED ORDER — GADOBUTROL 1 MMOL/ML IV SOLN
8.0000 mL | Freq: Once | INTRAVENOUS | Status: AC | PRN
  Administered 2020-11-12: 17:00:00 8 mL via INTRAVENOUS

## 2020-11-14 ENCOUNTER — Other Ambulatory Visit: Payer: Self-pay

## 2020-11-14 ENCOUNTER — Ambulatory Visit: Payer: Medicare Other | Admitting: Cardiology

## 2020-11-14 VITALS — BP 126/64 | HR 64 | Ht 66.0 in | Wt 182.0 lb

## 2020-11-14 DIAGNOSIS — Z01812 Encounter for preprocedural laboratory examination: Secondary | ICD-10-CM

## 2020-11-14 DIAGNOSIS — E785 Hyperlipidemia, unspecified: Secondary | ICD-10-CM | POA: Diagnosis not present

## 2020-11-14 DIAGNOSIS — I340 Nonrheumatic mitral (valve) insufficiency: Secondary | ICD-10-CM

## 2020-11-14 DIAGNOSIS — R918 Other nonspecific abnormal finding of lung field: Secondary | ICD-10-CM

## 2020-11-14 DIAGNOSIS — I712 Thoracic aortic aneurysm, without rupture, unspecified: Secondary | ICD-10-CM

## 2020-11-14 DIAGNOSIS — Z0189 Encounter for other specified special examinations: Secondary | ICD-10-CM

## 2020-11-14 DIAGNOSIS — I10 Essential (primary) hypertension: Secondary | ICD-10-CM

## 2020-11-14 DIAGNOSIS — I77819 Aortic ectasia, unspecified site: Secondary | ICD-10-CM

## 2020-11-14 DIAGNOSIS — I480 Paroxysmal atrial fibrillation: Secondary | ICD-10-CM

## 2020-11-14 DIAGNOSIS — Z79899 Other long term (current) drug therapy: Secondary | ICD-10-CM

## 2020-11-14 MED ORDER — ROSUVASTATIN CALCIUM 10 MG PO TABS: 10.0000 mg | ORAL_TABLET | Freq: Every day | ORAL | 2 refills | Status: DC

## 2020-11-14 NOTE — Patient Instructions (Signed)
Medication Instructions:   STOP TAKING SIMVASTATIN NOW  START TAKING ROSUVASTATIN (CRESTOR) 10 MG BY MOUTH DAILY  *If you need a refill on your cardiac medications before your next appointment, please call your pharmacy*   Lab Work:  IN 8 WEEKS--LIPIDS--PLEASE COME FASTING TO THIS LAB APPOINTMENT  If you have labs (blood work) drawn today and your tests are completely normal, you will receive your results only by: [place] (if you have MyChart) OR A paper copy in the mail If you have any lab test that is abnormal or we need to change your treatment, we will call you to review the results.  You have been referred to [place]   Testing/Procedures:  A chest x-ray takes a picture of the organs and structures inside the chest, including the heart, lungs, and blood vessels. This test can show several things, including, whether the heart is enlarges; whether fluid is building up in the lungs; and whether pacemaker / defibrillator leads are still in place.   TO BE DONE AT [facility]  MRA OF THE CHEST W/WO CONTRAST--NEEDS TO BE SCHEDULED TO HAVE DONE IN 6 MONTHS PER DR. Johney Frame TO FOLLOW DILATED AORTA   Follow-Up:  6 MONTHS IN THE OFFICE WITH DR. Dewayne Hatch NEEDS TO BE DONE BEFORE THIS VISIT

## 2020-11-17 ENCOUNTER — Other Ambulatory Visit: Payer: Self-pay | Admitting: Adult Health

## 2020-11-24 ENCOUNTER — Ambulatory Visit
Admission: RE | Admit: 2020-11-24 | Discharge: 2020-11-24 | Disposition: A | Discharge status: Home / Self Care | Payer: Medicare Other | Source: Ambulatory Visit | Attending: Cardiology | Admitting: Cardiology

## 2020-11-24 DIAGNOSIS — R918 Other nonspecific abnormal finding of lung field: Secondary | ICD-10-CM | POA: Diagnosis not present

## 2020-11-24 IMAGING — DX DG CHEST 2V
2 series · 2 of 2 positions shown · non-contrast
Comparison: [DATE]

CLINICAL DATA: Biapical nodularity seen on recent MRA

EXAM:
CHEST - 2 VIEW

[dg chest 2 view (1 of 2)]
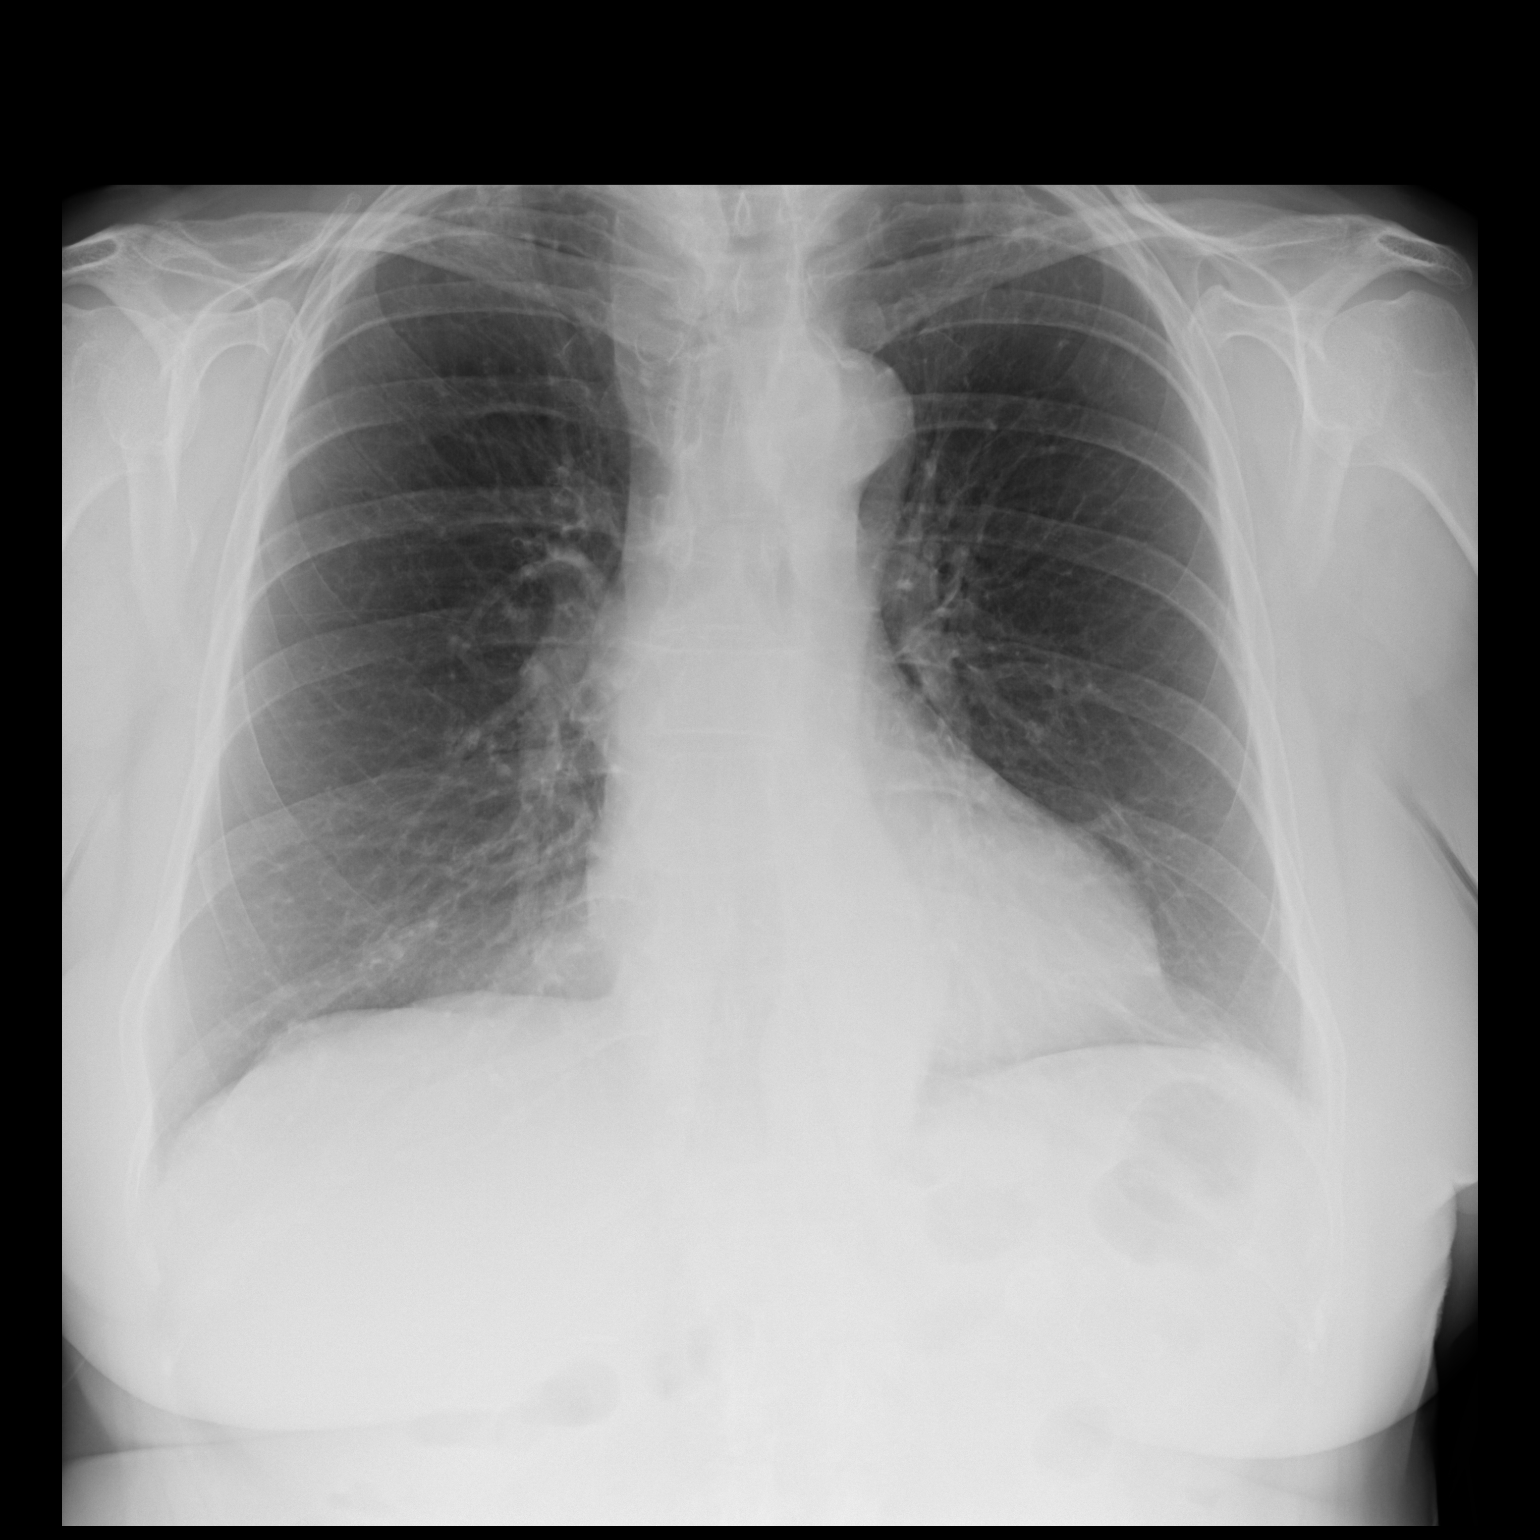

[dg chest 2 view (2 of 2)]
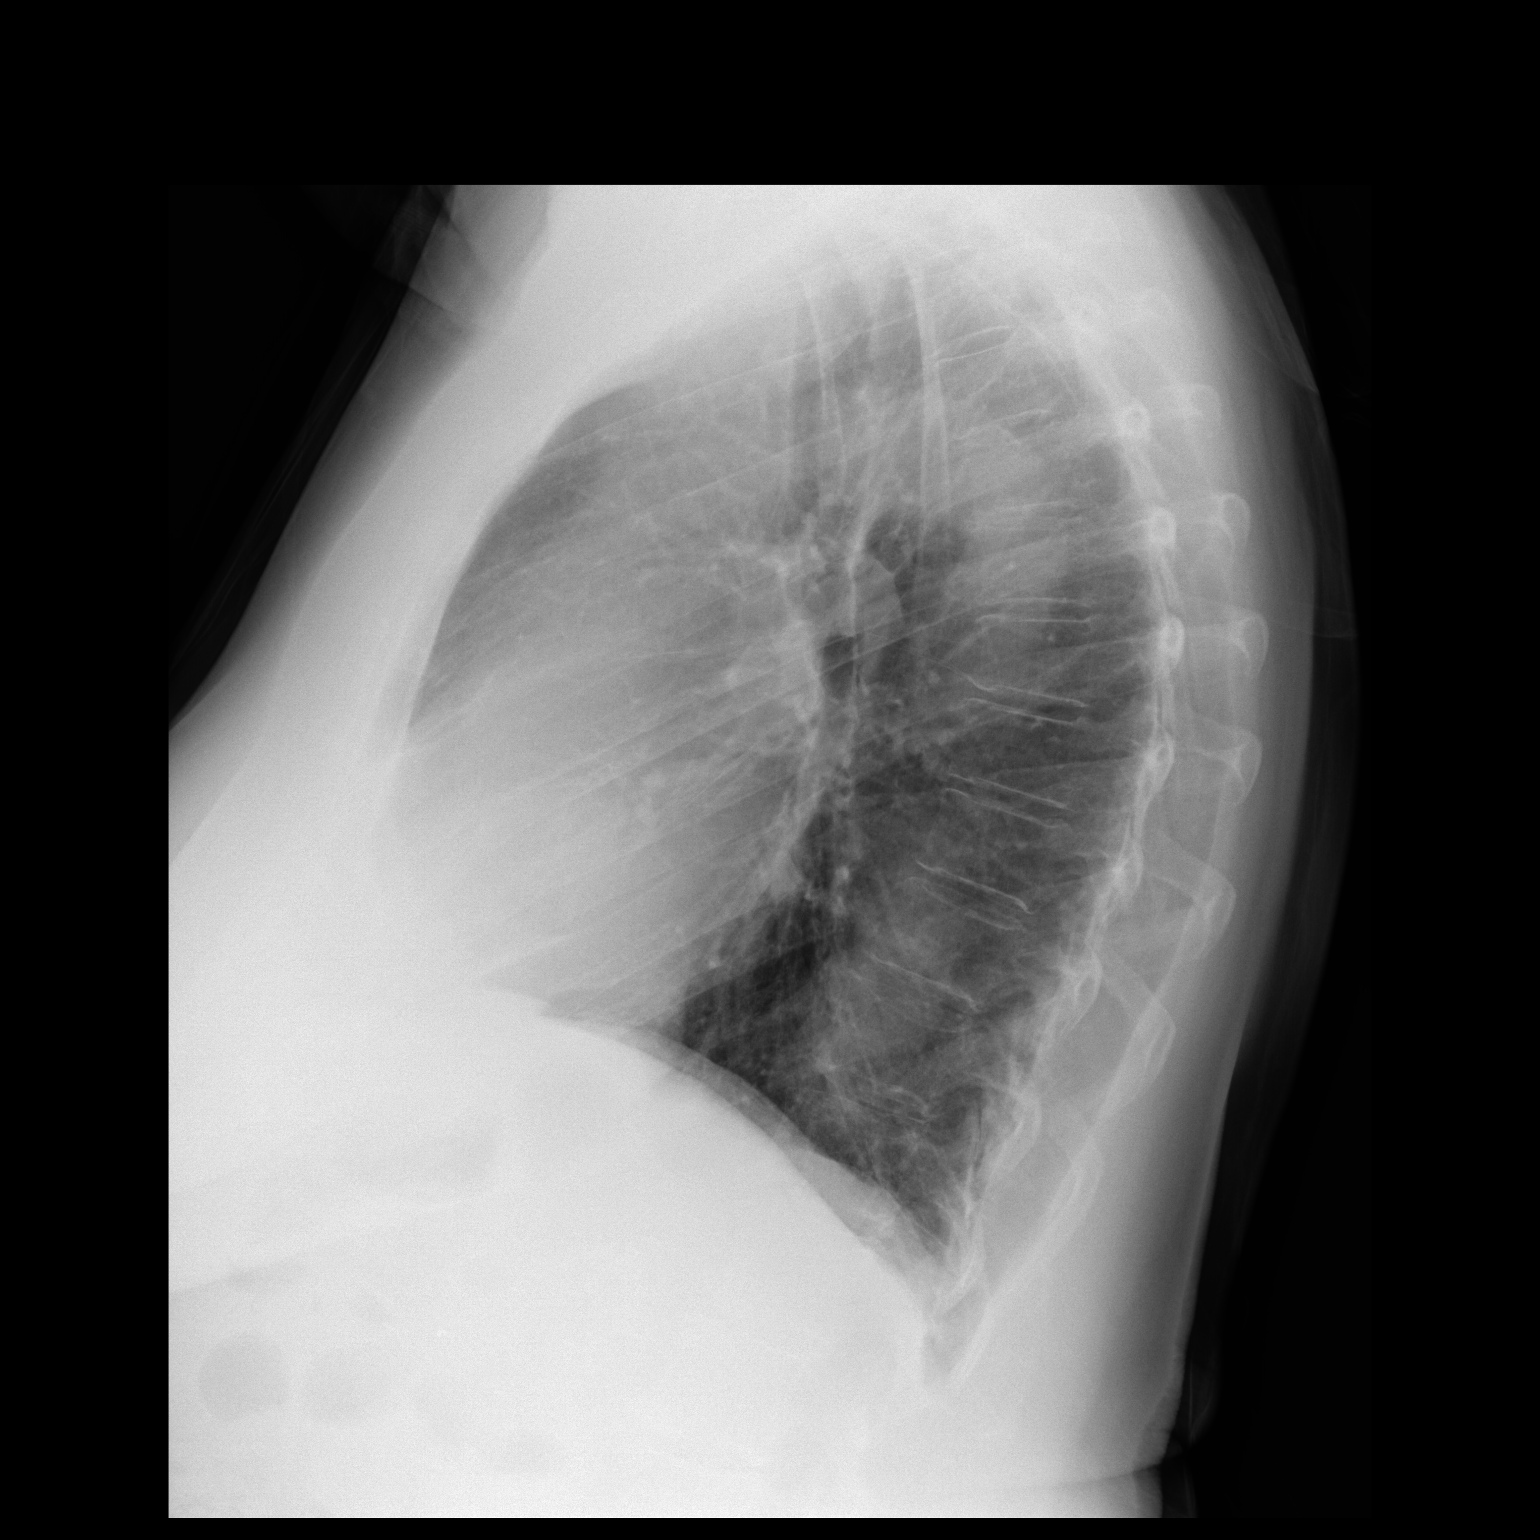

[2 of 2 positions shown; findings below may reference images not displayed]

FINDINGS: The heart size and mediastinal contours are within normal limits.
Areas of presumed mild pleuroparenchymal scarring at the bilateral
lung apices. Lungs appear otherwise clear. No discernible pulmonary
nodules or masses. No pleural effusion or pneumothorax. The
visualized skeletal structures are unremarkable.
IMPRESSION: Areas of presumed mild pleuroparenchymal scarring at the bilateral
lung apices. No active cardiopulmonary disease.

## 2020-12-04 ENCOUNTER — Other Ambulatory Visit: Payer: Self-pay | Admitting: Adult Health

## 2020-12-04 MED ORDER — ALPRAZOLAM 0.25 MG PO TABS: ORAL_TABLET | ORAL | 2 refills | Status: DC

## 2020-12-18 ENCOUNTER — Encounter: Payer: Medicare Other | Admitting: Cardiothoracic Surgery

## 2020-12-22 DIAGNOSIS — H524 Presbyopia: Secondary | ICD-10-CM | POA: Diagnosis not present

## 2020-12-22 DIAGNOSIS — H16223 Keratoconjunctivitis sicca, not specified as Sjogren's, bilateral: Secondary | ICD-10-CM | POA: Diagnosis not present

## 2020-12-22 DIAGNOSIS — H26491 Other secondary cataract, right eye: Secondary | ICD-10-CM | POA: Diagnosis not present

## 2020-12-24 ENCOUNTER — Other Ambulatory Visit: Payer: Medicare Other

## 2020-12-29 ENCOUNTER — Telehealth: Payer: Self-pay | Admitting: Pharmacist

## 2020-12-29 NOTE — Chronic Care Management (AMB) (Signed)
Chronic Care Management Pharmacy Assistant   Name: Glenda Bauer  MRN: [id number] DOB: [birth year]   Reason for Encounter: Disease State COPD/Asthma Assessment Call   Conditions to be addressed/monitored: COPD/ ASTHMA  Recent office visits:  None  Recent consult visits:  11-14-2020 [name], MD (Cardiology) - Patient presented for Paroxysmal atrial fibrillation and other issues. Chest Xray ordered. Simvastatin 10 MG stopped and Rosuvastatin 10 MG once daily started.  Hospital visits:  None in previous 6 months  Medications: Outpatient Encounter Medications as of 12/29/2020  Medication Sig   albuterol (PROVENTIL) (2.5 MG/3ML) 0.083% nebulizer solution USE 1 VIAL VIA NEBULIZER EVERY 6 HOURS AS NEEDED FOR WHEEZING OR SHORTNESS OF BREATH   albuterol (VENTOLIN HFA) 108 (90 Base) MCG/ACT inhaler Inhale 2 puffs into the lungs every 6 (six) hours as needed for wheezing or shortness of breath.   ALPRAZolam (XANAX) 0.25 MG tablet TAKE 1 TO 2 TABLETS BY MOUTH DAILY AS NEEDED   famotidine (PEPCID) 20 MG tablet Take 20 mg by mouth daily.   flecainide (TAMBOCOR) 50 MG tablet Take 1 tablet (50 mg total) by mouth 2 (two) times daily.   FLUoxetine (PROZAC) 20 MG capsule TAKE 1 CAPSULE(20 MG) BY MOUTH DAILY   fluticasone (FLONASE) 50 MCG/ACT nasal spray Place 2 sprays into both nostrils as needed.   fluticasone furoate-vilanterol (BREO ELLIPTA) 200-25 MCG/INH AEPB INHALE 1 PUFF INTO THE LUNGS DAILY   levocetirizine (XYZAL) 5 MG tablet Take 5 mg by mouth every evening.   lisinopril-hydrochlorothiazide (ZESTORETIC) 20-12.5 MG tablet Take 2 tablets by mouth daily.   metoprolol succinate (TOPROL-XL) 25 MG 24 hr tablet Take 1 tablet (25 mg total) by mouth daily. Take with or immediately following a meal.   montelukast (SINGULAIR) 10 MG tablet Take 1 tablet (10 mg total) by mouth at bedtime.   Multiple Vitamin (MULTIVITAMIN) tablet Take 1 tablet by mouth daily.   Multiple  Vitamins-Minerals (VISION FORMULA PO) Take by mouth daily.    Probiotic Product (ALIGN) 4 MG CAPS Take by mouth daily.   rivaroxaban (XARELTO) 20 MG TABS tablet TAKE 1 TABLET(20 MG) BY MOUTH DAILY WITH SUPPER   rosuvastatin (CRESTOR) 10 MG tablet Take 1 tablet (10 mg total) by mouth daily.   triamcinolone cream (KENALOG) 0.1 % APPLY TOPICALLY TWICE DAILY   No facility-administered encounter medications on file as of 12/29/2020.   Notes: Patient reports that she was prescribed an eyedrop  Reed Pandy) from [place] with Dr Valma Cava that she has not picked up because it was 700.00. patient also reported that Dr. Valma Cava was aware of the cost and had two other alternate drops she could try.   Patient also reports Albuterol was expired [facility] to deliver. Patient has income and prescription info for PAP for Breo Ellipta that she will drop in the mail within the week. CPP notified  Current Asthma regimen:  Breo Ellipta 200-25 mcg/act inhale 1 puff daily Albuterol nebulizer as needed Proair respiclick 2 puffs as needed Montelukast 10 mg 1 tablet daily  Reports Asthma symptoms, including cough, Patient reports that she had COVID a while back and notices a flaring when she is taking longer than usual walks. Have you had exacerbation/flare-up since last visit? Yes What do you do when you are short of breath?  Rescue medication Patient reports she has had to use her rescue inhaler as recently as yesterday, after use symptoms were resolved.  Respiratory Devices/Equipment Do you have a nebulizer? Yes Patient reports  she is out of Albuterol is expired what she has. Do you use a Peak Flow Meter? No Patient reports she has one Do you use a maintenance inhaler? Yes How often do you forget to use your daily inhaler? Patient reports she does not forget to use Do you use a rescue inhaler? Yes How often do you use your rescue inhaler?  prn Do you use a spacer with your inhaler?  No  Adherence Review: Does the patient have >5 day gap between last estimated fill date for maintenance inhaler medications? Yes   Fill History : LISINOPRIL-HCTZ 20/12.'5MG'$  TABLETS 08/02/2020 11    Care Gaps:  TDAP - Overdue Zoster vaccine - Overdue Pneumovax - Overdue COVID Booster #4 [employer]) - Overdue CPP follow up call  scheduled 02-18-2021 1pm Last AWV 09-29-20 no appointment scheduled for 2023  Star Rating Drugs:  Rosuvastatin 10 MG - Last filled 11-14-2020 [facility] DS at [facility] Lisinopril- HCTZ 20/12.5 mg - Last filled 08-02-2020 11 DS at [facility] Pharmacist Assistant [phone]

## 2021-01-08 ENCOUNTER — Institutional Professional Consult (permissible substitution): Payer: Medicare Other | Admitting: Physician Assistant

## 2021-01-08 ENCOUNTER — Other Ambulatory Visit: Payer: Self-pay

## 2021-01-08 DIAGNOSIS — I712 Thoracic aortic aneurysm, without rupture, unspecified: Secondary | ICD-10-CM | POA: Insufficient documentation

## 2021-01-08 NOTE — Progress Notes (Signed)
Glenda Bauer       [place] [address]             [phone]      PCP is [name], NP Referring Provider is [name], NP  No chief complaint on file.   HPI:  Glenda Bauer is a 70 year old patient with multiple medical problems who was referred to [place] today for ongoing a sending aortic aneurysm surveillance.  She had a TTE on April 08, 2020 which showed that she has an EF of 50 to 55% and measured her ascending aorta at 44 mm.  She does also have a history of mild to moderate MR which she is getting TTE is done every 2 years to evaluate.  On 11/12/2020 she had an MRA which measured her ascending aorta at 45 mm.  She has a history of atrial fibrillation, hypertension, hyperlipidemia, asthma, anxiety, depression, and she was a former smoker.  She has been followed by Dr. Johney Frame.  Their office has been working on tight hypertension control as well as starting her on a new statin for her hyperlipidemia.  Today, she is feeling well with no symptoms.  She continues to work 6 days a week and takes care of her 2 dogs at home.  She has been walking 6 times a day, short walks with her dogs.  She does sometimes struggle in the heat.   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 Hipolito Bayley   Depression    GERD (gastroesophageal reflux disease)    Heart murmur    Hypertension    Neuromuscular disorder ([place])    Osteoarthritis    Sebaceous cyst 2022   right vulva   Thyroid nodule    Urinary tract infection     Past Surgical History:  Procedure Laterality Date   Cataracts Bilateral    CHOLECYSTECTOMY  2015   melenoma removal  1979   Sinus Polyps       Family History  Problem Relation Age of Onset   Arthritis Mother    Ovarian cancer  Mother        Metastatic   Breast cancer Mother 59   Thyroid disease Mother    Parkinson's disease Father    Crohn's disease Brother    Schizophrenia Brother    Colon cancer Neg Hx    Esophageal cancer Neg Hx    Rectal cancer Neg Hx    Stomach cancer Neg Hx     Social History Social History   Tobacco Use   Smoking status: Former    Packs/day: 0.25    Years: 10.00    Pack years: 2.50    Types: Cigarettes   Smokeless tobacco: Never   Tobacco comments:    35 years ago; smoke socially  Vaping Use   Vaping Use: Never used  Substance Use Topics   Alcohol use: No    Alcohol/week: 0.0 standard drinks   Drug use: No    Current Outpatient Medications  Medication Sig Dispense Refill   albuterol (PROVENTIL) (2.5 MG/3ML) 0.083% nebulizer solution USE 1 VIAL VIA NEBULIZER EVERY 6 HOURS AS NEEDED FOR WHEEZING OR SHORTNESS OF BREATH 225 mL 0   albuterol (VENTOLIN HFA)  108 (90 Base) MCG/ACT inhaler Inhale 2 puffs into the lungs every 6 (six) hours as needed for wheezing or shortness of breath. 8 g 2   ALPRAZolam (XANAX) 0.25 MG tablet TAKE 1 TO 2 TABLETS BY MOUTH DAILY AS NEEDED 30 tablet 2   famotidine (PEPCID) 20 MG tablet Take 20 mg by mouth daily.     flecainide (TAMBOCOR) 50 MG tablet Take 1 tablet (50 mg total) by mouth 2 (two) times daily. 60 tablet 5   FLUoxetine (PROZAC) 20 MG capsule TAKE 1 CAPSULE(20 MG) BY MOUTH DAILY 90 capsule 1   fluticasone (FLONASE) 50 MCG/ACT nasal spray Place 2 sprays into both nostrils as needed.     fluticasone furoate-vilanterol (BREO ELLIPTA) 200-25 MCG/INH AEPB INHALE 1 PUFF INTO THE LUNGS DAILY 180 each 3   levocetirizine (XYZAL) 5 MG tablet Take 5 mg by mouth every evening.     lisinopril-hydrochlorothiazide (ZESTORETIC) 20-12.5 MG tablet Take 2 tablets by mouth daily. 180 tablet 2   metoprolol succinate (TOPROL-XL) 25 MG 24 hr tablet Take 1 tablet (25 mg total) by mouth daily. Take with or immediately following a meal. 90 tablet 3   montelukast  (SINGULAIR) 10 MG tablet Take 1 tablet (10 mg total) by mouth at bedtime. 90 tablet 3   Multiple Vitamin (MULTIVITAMIN) tablet Take 1 tablet by mouth daily.     Multiple Vitamins-Minerals (VISION FORMULA PO) Take by mouth daily.      Probiotic Product (ALIGN) 4 MG CAPS Take by mouth daily.     rivaroxaban (XARELTO) 20 MG TABS tablet TAKE 1 TABLET(20 MG) BY MOUTH DAILY WITH SUPPER 90 tablet 3   rosuvastatin (CRESTOR) 10 MG tablet Take 1 tablet (10 mg total) by mouth daily. 90 tablet 2   triamcinolone cream (KENALOG) 0.1 % APPLY TOPICALLY TWICE DAILY 454 g 0   No current facility-administered medications for this visit.    No Known Allergies    Vitals:   01/08/21 0923  BP: 121/78  Pulse: 61  Resp: 20  SpO2: 95%     LMP 06/07/2000 (Approximate)  Physical Exam:  General: well appearing, cooperative Cor: NSR, no murmur Pulm: CTA bilaterally and in all fields Ext: no edema Neuro: Alert and oriented x 3 Psych: normal affect  Diagnostic Tests: ECHOCARDIOGRAM REPORT         Patient Name:   Glenda Bauer Date of Exam: 04/08/2020  Medical Rec #:  [id number]           Height:       66.3 in  Accession #:    [id number]          Weight:       181.8 lb  Date of Birth:  [birth year]           BSA:          1.926 m  Patient Age:    81 years            BP:           162/91 mmHg  Patient Gender: F                   HR:           56 bpm.  Exam Location:  Outpatient   Procedure: 2D Echo   Indications:    atrial fibrillation 427.31     History:        Patient has no prior history of Echocardiogram  examinations.  Risk Factors:Hypertension.     Sonographer:    Johny Chess RDCS  Referring Phys: [id number] [place]     1. Left ventricular ejection fraction, by estimation, is 50 to 55%. The  left ventricle has low normal function. The left ventricle has no regional  wall motion abnormalities. Left ventricular diastolic parameters are   indeterminate.   2. Right ventricular systolic function is normal. The right ventricular  size is normal. There is normal pulmonary artery systolic pressure.   3. The mitral valve is normal in structure. Mild to moderate mitral valve  regurgitation. No evidence of mitral stenosis.   4. The aortic valve is normal in structure. Aortic valve regurgitation is  mild to moderate. No aortic stenosis is present.   5. Aortic dilatation noted. There is mild to moderate dilatation of the  ascending aorta, measuring 44 mm.   FINDINGS   Left Ventricle: Left ventricular ejection fraction, by estimation, is 50  to 55%. The left ventricle has low normal function. The left ventricle has  no regional wall motion abnormalities. The left ventricular internal  cavity size was normal in size.  There is no left ventricular hypertrophy. Left ventricular diastolic  parameters are indeterminate.   Right Ventricle: The right ventricular size is normal. No increase in  right ventricular wall thickness. Right ventricular systolic function is  normal. There is normal pulmonary artery systolic pressure. The tricuspid  regurgitant velocity is 2.27 m/s, and   with an assumed right atrial pressure of 3 mmHg, the estimated right  ventricular systolic pressure is 123456 mmHg.   Left Atrium: Left atrial size was normal in size.   Right Atrium: Right atrial size was normal in size.   Pericardium: There is no evidence of pericardial effusion.   Mitral Valve: The mitral valve is normal in structure. Mild to moderate  mitral valve regurgitation. No evidence of mitral valve stenosis.   Tricuspid Valve: The tricuspid valve is normal in structure. Tricuspid  valve regurgitation is trivial.   Aortic Valve: The aortic valve is normal in structure. Aortic valve  regurgitation is mild to moderate. Aortic regurgitation PHT measures 576  msec. No aortic stenosis is present.   Pulmonic Valve: The pulmonic valve was normal in  structure. Pulmonic valve  regurgitation is trivial. No evidence of pulmonic stenosis.   Aorta: Aortic dilatation noted. There is mild to moderate dilatation of  the ascending aorta, measuring 44 mm.   IAS/Shunts: The atrial septum is grossly normal.      LEFT VENTRICLE  PLAX 2D  LVIDd:         4.90 cm  Diastology  LVIDs:         2.90 cm  LV e' medial:    7.40 cm/s  LV PW:         1.10 cm  LV E/e' medial:  13.2  LV IVS:        1.00 cm  LV e' lateral:   8.59 cm/s  LVOT diam:     1.90 cm  LV E/e' lateral: 11.4  LV SV:         71  LV SV Index:   37  LVOT Area:     2.84 cm      RIGHT VENTRICLE  RV S prime:     10.60 cm/s  TAPSE (M-mode): 2.3 cm   LEFT ATRIUM             Index  RIGHT ATRIUM           Index  LA diam:        4.40 cm 2.28 cm/m  RA Area:     15.80 cm  LA Vol (A2C):   52.1 ml 27.05 ml/m RA Volume:   35.10 ml  18.22 ml/m  LA Vol (A4C):   47.1 ml 24.45 ml/m  LA Biplane Vol: 50.3 ml 26.11 ml/m   AORTIC VALVE  LVOT Vmax:   98.60 cm/s  LVOT Vmean:  65.800 cm/s  LVOT VTI:    0.252 m  AI PHT:      576 msec     AORTA  Ao Root diam: 3.00 cm  Ao Asc diam:  4.35 cm   MITRAL VALVE                 TRICUSPID VALVE  MV Area (PHT): 2.82 cm      TR Peak grad:   20.6 mmHg  MV Decel Time: 269 msec      TR Vmax:        227.00 cm/s  MR Peak grad:    112.4 mmHg  MR Mean grad:    75.0 mmHg   SHUNTS  MR Vmax:         530.00 cm/s Systemic VTI:  0.25 m  MR Vmean:        406.0 cm/s  Systemic Diam: 1.90 cm  MR PISA:         1.01 cm  MR PISA Eff ROA: 8 mm  MR PISA Radius:  0.40 cm  MV E velocity: 97.70 cm/s  MV A velocity: 59.10 cm/s  MV E/A ratio:  1.65   Mertie Moores MD  Electronically signed by Mertie Moores MD  Signature Date/Time: 04/08/2020/10:47:58 AM         Final     CLINICAL DATA:  70 year old female with a history of thoracic aortic aneurysm   EXAM: MRA CHEST WITH OR WITHOUT CONTRAST   TECHNIQUE: Angiographic images of the chest were obtained  using MRA technique without and with intravenous contrast.   CONTRAST:  66m GADAVIST GADOBUTROL 1 MMOL/ML IV SOLN   COMPARISON:  None.   FINDINGS: VASCULAR   Aorta: Tortuous thoracic aorta with type 3 arch. No pedunculated plaque. No periaortic fluid. No dissection.   The estimated diameter of the ascending aorta is 45 mm. No significant atherosclerotic changes.   Heart: Heart size within normal limits.  No pericardial fluid.   Pulmonary Arteries: Flow signal of the pulmonary arteries unremarkable. Main pulmonary artery measures 32 mm.   NON-VASCULAR   Pleura/lungs: No pleural fluid.   Nodular changes at the bilateral lung apices, pleuroparenchymal interface, poorly characterized on MR.   No confluent airspace disease.   Mediastinum: No adenopathy. Unremarkable thoracic inlet. Hiatal hernia.   Musculoskeletal: Relatively uniform signal within the visualized spinal vertebral bodies. No narrowing of the spinal canal.   Unremarkable appearance of the chest wall with no adenopathy.   Upper abdomen: Unremarkable   IMPRESSION: Estimated maximum diameter of the ascending aorta 45 mm. Recommend semi-annual imaging followup by CTA or MRA and referral to cardiothoracic surgery if not already obtained. This recommendation follows 2010 ACCF/AHA/AATS/ACR/ASA/SCA/SCAI/SIR/STS/SVM Guidelines for the Diagnosis and Management of Patients With Thoracic Aortic Disease. Circulation. 2010; 121:[id number] Aortic aneurysm NOS (ICD10-I71.9)   Lung nodularity at the bilateral lung apices, poorly characterized by MR. If MR surveillance of the aorta is elected, suggest at least chest x-ray documentation of what is  presumably benign scarring at the lung apices. Otherwise, this could be characterized on surveillance CT angiogram.   Signed,   Dulcy Fanny. Dellia Nims, RPVI   [facility]   [facility]     Electronically Signed   By: Corrie Mckusick D.O.   On: 11/13/2020 10:00    Impression: Patient is a 70 year old female patient who comes to our office for surveillance of her ascending aortic aneurysm.  She is asymptomatic at this time and her blood pressure is well controlled.  She is diligent about taking her blood pressure at home and she keeps a record in a notebook.  She continues to be active every day and is working 6 days a week.  Plan:  Continue surveillance of her ascending aortic aneurysm which was measured at 45 mm on 11/12/2020 via MRA.  She already has a follow-up MRA scheduled for December 7 and I have arranged for her to follow-up with Dr. Cyndia Bent the following week to review the study.  We would also recommend that she continues to get an echocardiogram every 2 years for monitoring of her mild to moderate mitral regurgitation in addition to monitoring her aortic valve.  She is also scheduled for some lab work tomorrow including a lipid profile and a BMP.      Elgie Collard, PA-C [facility] [phone]

## 2021-01-09 ENCOUNTER — Other Ambulatory Visit: Payer: Medicare Other | Admitting: *Deleted

## 2021-01-09 DIAGNOSIS — I712 Thoracic aortic aneurysm, without rupture, unspecified: Secondary | ICD-10-CM

## 2021-01-09 DIAGNOSIS — I480 Paroxysmal atrial fibrillation: Secondary | ICD-10-CM

## 2021-01-09 DIAGNOSIS — I77819 Aortic ectasia, unspecified site: Secondary | ICD-10-CM

## 2021-01-09 DIAGNOSIS — Z0189 Encounter for other specified special examinations: Secondary | ICD-10-CM

## 2021-01-09 DIAGNOSIS — Z79899 Other long term (current) drug therapy: Secondary | ICD-10-CM

## 2021-01-09 DIAGNOSIS — I10 Essential (primary) hypertension: Secondary | ICD-10-CM | POA: Diagnosis not present

## 2021-01-09 DIAGNOSIS — E785 Hyperlipidemia, unspecified: Secondary | ICD-10-CM

## 2021-01-09 LAB — LIPID PANEL
Chol/HDL Ratio: 1.9 ratio (ref 0.0–4.4)
Cholesterol, Total: 122 mg/dL (ref 100–199)
HDL: 65 mg/dL (ref >39)
LDL Chol Calc (NIH): 45 mg/dL (ref 0–99)
Triglycerides: 49 mg/dL (ref 0–149)
VLDL Cholesterol Cal: 12 mg/dL (ref 5–40)

## 2021-02-02 ENCOUNTER — Other Ambulatory Visit: Payer: Self-pay

## 2021-02-02 ENCOUNTER — Ambulatory Visit
Admission: RE | Admit: 2021-02-02 | Discharge: 2021-02-02 | Disposition: A | Discharge status: Home / Self Care | Payer: Medicare Other | Source: Ambulatory Visit | Attending: Obstetrics and Gynecology | Admitting: Obstetrics and Gynecology

## 2021-02-02 DIAGNOSIS — N9089 Other specified noninflammatory disorders of vulva and perineum: Secondary | ICD-10-CM

## 2021-02-02 IMAGING — US US PELVIS LIMITED
1 series · 11 of 11 positions shown · non-contrast
Comparison: [DATE]

CLINICAL DATA: right labia majora swelling/The radiologist is
recommending a follow up ultrasound in 2 - 3 months

EXAM:
LIMITED ULTRASOUND OF PELVIS
TECHNIQUE: Limited transabdominal ultrasound examination of the pelvis was
performed.

[Series 1: us pelvis limited · 0.05mm/px · 11 acquisitions, 11 frames shown]
[im 1/11]
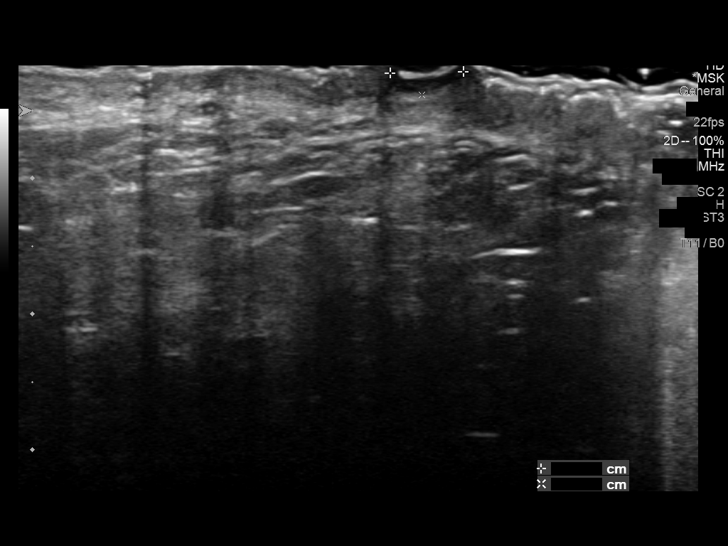
[im 2/11]
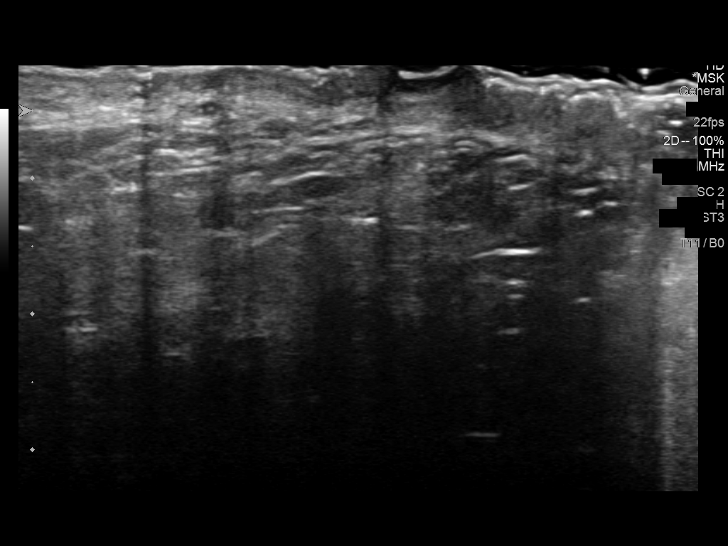
[im 3/11]
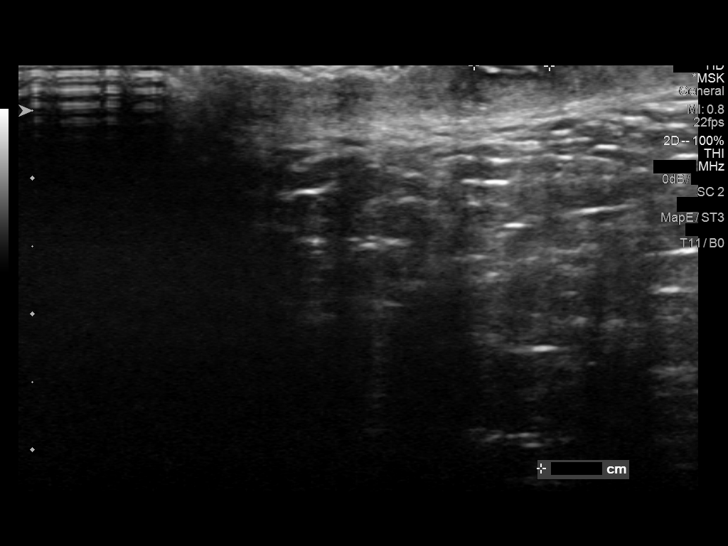
[im 4/11]
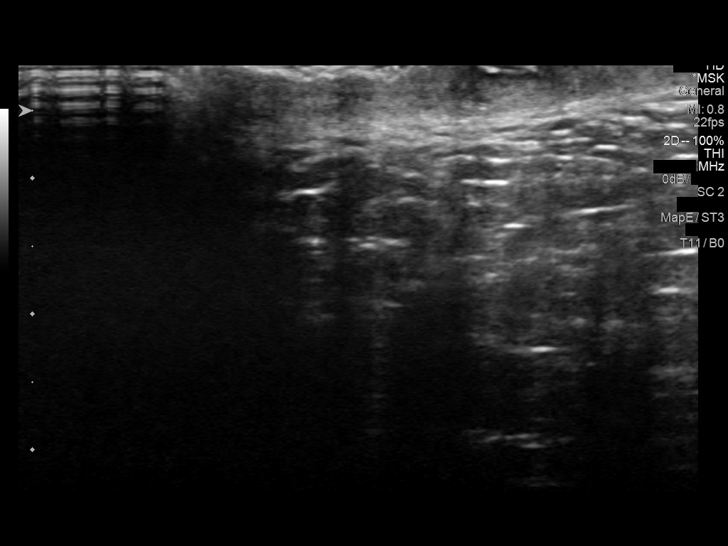
[im 5/11]
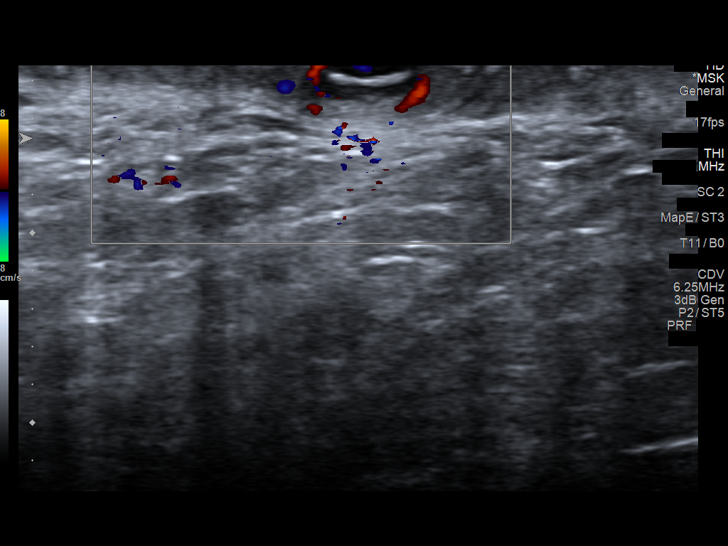
[im 6/11]
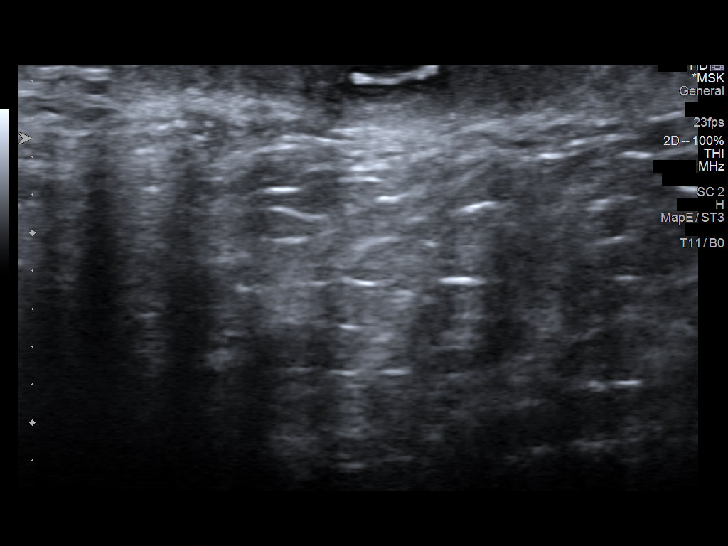
[im 7/11]
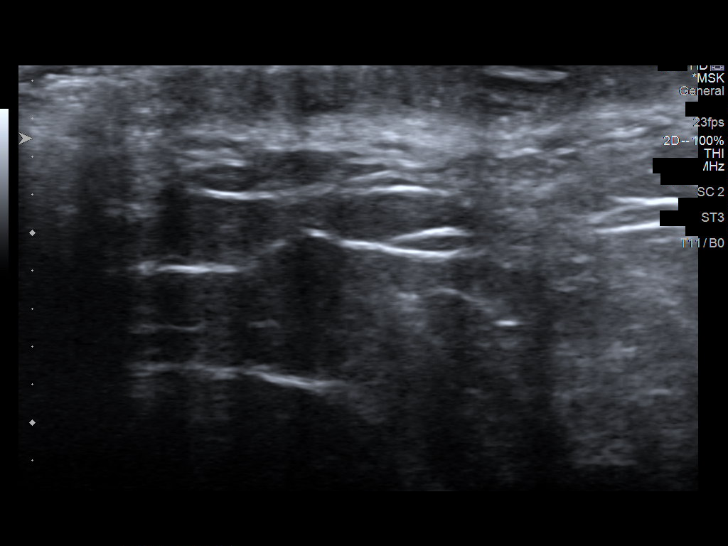
[im 8/11]
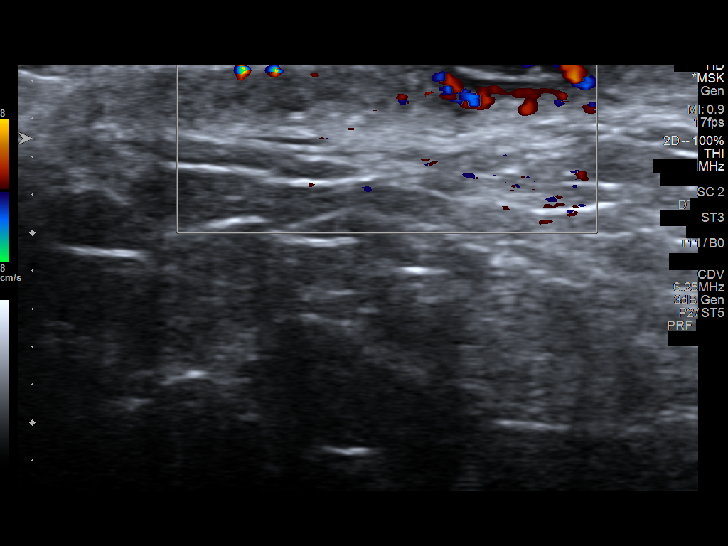
[im 9/11]
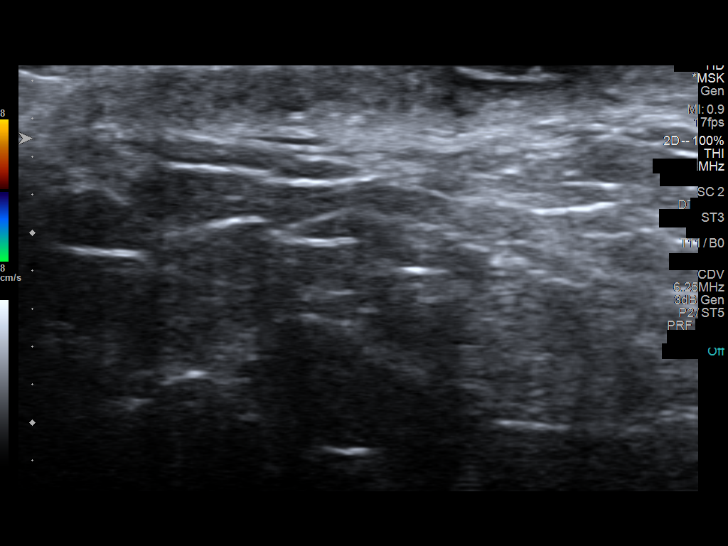
[im 10/11]
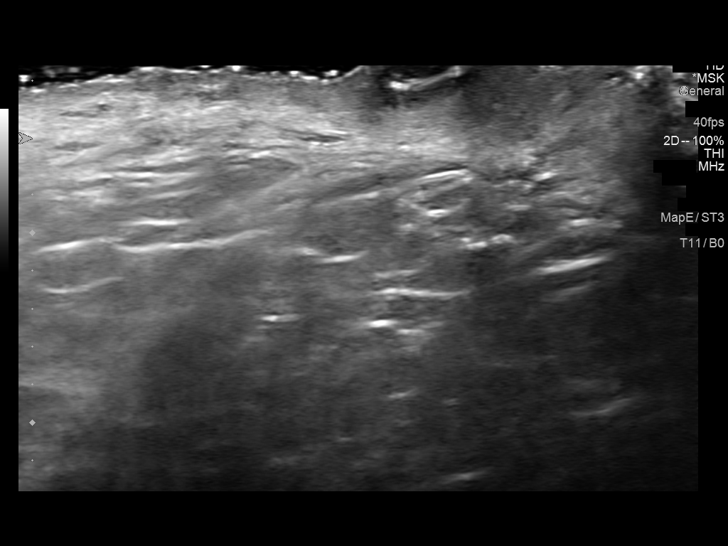
[im 11/11]
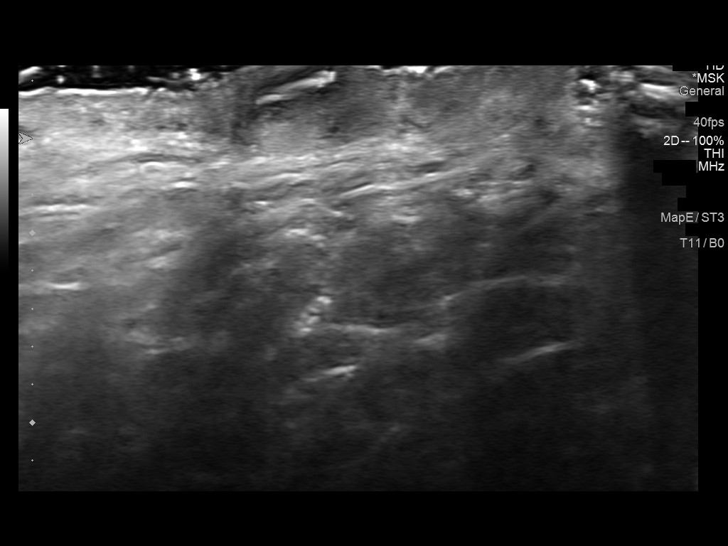

[11 of 11 positions shown; findings below may reference images not displayed]

FINDINGS: Focused sonographic exam of the right labia majora was performed in
the area of interest. Again seen is a mixed echogenicity nodule
limited to the dermis measuring up to 6 mm. A single nodule is
identified. No other abnormality of the superficial tissues is
noted.
IMPRESSION: A single dermal nodule measuring up to 6 mm is identified along the
right labia majora, favored to represent a sebaceous cyst.

## 2021-02-05 NOTE — Progress Notes (Signed)
Patient informed. 

## 2021-02-05 NOTE — Progress Notes (Signed)
Patient informed with below note. Patient is not seeing her PCP, recall placed for breast and pelvic exam in 09/2021

## 2021-02-11 ENCOUNTER — Telehealth: Payer: Self-pay | Admitting: Pharmacist

## 2021-02-11 NOTE — Chronic Care Management (AMB) (Signed)
Chronic Care Management Pharmacy Assistant   Name: Glenda Bauer  MRN: [id number] DOB: [birth year]   Reason for Encounter: Medication Review/ Medication Coordination Call    Recent office visits:  None   Recent consult visits:  02-02-2021 - Patient presented to [facility] for [facility] visits:  None in previous 6 months  Medications: Outpatient Encounter Medications as of 02/11/2021  Medication Sig   albuterol (PROVENTIL) (2.5 MG/3ML) 0.083% nebulizer solution USE 1 VIAL VIA NEBULIZER EVERY 6 HOURS AS NEEDED FOR WHEEZING OR SHORTNESS OF BREATH   albuterol (VENTOLIN HFA) 108 (90 Base) MCG/ACT inhaler Inhale 2 puffs into the lungs every 6 (six) hours as needed for wheezing or shortness of breath.   ALPRAZolam (XANAX) 0.25 MG tablet TAKE 1 TO 2 TABLETS BY MOUTH DAILY AS NEEDED   famotidine (PEPCID) 20 MG tablet Take 20 mg by mouth daily.   flecainide (TAMBOCOR) 50 MG tablet Take 1 tablet (50 mg total) by mouth 2 (two) times daily.   FLUoxetine (PROZAC) 20 MG capsule TAKE 1 CAPSULE(20 MG) BY MOUTH DAILY   fluticasone (FLONASE) 50 MCG/ACT nasal spray Place 2 sprays into both nostrils as needed.   fluticasone furoate-vilanterol (BREO ELLIPTA) 200-25 MCG/INH AEPB INHALE 1 PUFF INTO THE LUNGS DAILY   levocetirizine (XYZAL) 5 MG tablet Take 5 mg by mouth every evening.   lisinopril-hydrochlorothiazide (ZESTORETIC) 20-12.5 MG tablet Take 2 tablets by mouth daily.   metoprolol succinate (TOPROL-XL) 25 MG 24 hr tablet Take 1 tablet (25 mg total) by mouth daily. Take with or immediately following a meal.   montelukast (SINGULAIR) 10 MG tablet Take 1 tablet (10 mg total) by mouth at bedtime.   Multiple Vitamin (MULTIVITAMIN) tablet Take 1 tablet by mouth daily.   Multiple Vitamins-Minerals (VISION FORMULA PO) Take by mouth daily.    Probiotic Product (ALIGN) 4 MG CAPS Take by mouth daily.   rivaroxaban (XARELTO) 20 MG TABS tablet TAKE 1 TABLET(20 MG) BY MOUTH  DAILY WITH SUPPER   rosuvastatin (CRESTOR) 10 MG tablet Take 1 tablet (10 mg total) by mouth daily.   triamcinolone cream (KENALOG) 0.1 % APPLY TOPICALLY TWICE DAILY   No facility-administered encounter medications on file as of 02/11/2021.  Reviewed chart for medication changes ahead of medication coordination call.  No OVs, Consults, or hospital visits since last care coordination call/Pharmacist visit.   No medication changes indicated   BP Readings from Last 3 Encounters:  01/08/21 121/78  11/14/20 126/64  10/01/20 118/72    No results found for: HGBA1C   Patient obtains medications through Adherence Packaging  90 Days    Last adherence delivery included: Not available Per notes  Patient is due for next adherence delivery on: 02-20-2021. Called patient and reviewed medications and coordinated delivery. Confirmed Packaging for 90 DS Patient has enough medication to wait for additions on her delivery date  This delivery to include: Rosuvastatin (Crestor) 10 mg - Take one at Breakfast Fluoxetine (Prozac) 20 mg- Take one at Breakfast Lisinopril-HCTZ (Zestoric) 20-12.5 mg - Take two tablets at Breakfast Metropolol Succinate (Toprolol-XL) 25 mg - Take one at Breakfast Rivaroxaban (Xarelto) 20 mg - Take one at Evening Meal Flecainide (Tambocor) 50 mg - Take one at Breakfast and one at Bedtime Montelukast (Singulair) 10 mg - Take one at Bedtime  Added  Xanax 0.25 take 1-2 tabs as needed (Patient has 10 left on hand) Will need in 27 days 2 more Breo Inhalers( just got one on 02-08-21 is 90 DS  customer updated form and making a task for reminder to check on)  Confirmed delivery date of 02-20-2021, advised patient that pharmacy will contact them the morning of delivery.   Care Gaps: TDAP - Overdue Zoster vaccine - Overdue Pneumovax - Overdue COVID Booster #4 [employer]) - Overdue CPP follow up call  scheduled 02-18-2021 1pm Last AWV 09-29-20 - Office aware to schedule  Star Rating  Drugs: Rosuvastatin 10 MG - Last filled 11-14-2020 90 DS at [facility] Lisinopril- HCTZ 20/12.5 mg - Last filled 08-02-2020 11 DS at [facility]  Notes: Attempted to reach on 02-11-2021 left VM with pt/son  [place] Pharmacist Assistant [phone]

## 2021-02-17 ENCOUNTER — Telehealth: Payer: Self-pay | Admitting: Pharmacist

## 2021-02-17 NOTE — Chronic Care Management (AMB) (Signed)
    Chronic Care Management Pharmacy Assistant   Name: Glenda Bauer  MRN: [id number] DOB: 05-Jun-1951  02-17-2021 APPOINTMENT REMINDER   Called Glenda Bauer, No answer, left message of appointment on 02-18-2021 at 1 via telephone visit with [name], Pharm D. Notified to have all medications, supplements, blood pressure and/or blood sugar logs available during appointment and to return call if need to reschedule.  Care Gaps: TDAP - Overdue Zoster vaccine - Overdue Pneumovax - Overdue COVID Booster #4 [employer]) - Overdue CPP follow up call  scheduled 02-18-2021 1pm Last AWV 09-29-20 - Office aware to schedule   Star Rating Drug: Rosuvastatin 10 MG - Last filled 02-16-2021 90 DS at [facility] Lisinopril- HCTZ 20/12.5 mg - Last filled 02-16-2021 90 DS at [facility]  Any gaps in medications fill history?  None    Medications: Outpatient Encounter Medications as of 02/17/2021  Medication Sig   albuterol (PROVENTIL) (2.5 MG/3ML) 0.083% nebulizer solution USE 1 VIAL VIA NEBULIZER EVERY 6 HOURS AS NEEDED FOR WHEEZING OR SHORTNESS OF BREATH   albuterol (VENTOLIN HFA) 108 (90 Base) MCG/ACT inhaler Inhale 2 puffs into the lungs every 6 (six) hours as needed for wheezing or shortness of breath.   ALPRAZolam (XANAX) 0.25 MG tablet TAKE 1 TO 2 TABLETS BY MOUTH DAILY AS NEEDED   famotidine (PEPCID) 20 MG tablet Take 20 mg by mouth daily.   flecainide (TAMBOCOR) 50 MG tablet Take 1 tablet (50 mg total) by mouth 2 (two) times daily.   FLUoxetine (PROZAC) 20 MG capsule TAKE 1 CAPSULE(20 MG) BY MOUTH DAILY   fluticasone (FLONASE) 50 MCG/ACT nasal spray Place 2 sprays into both nostrils as needed.   fluticasone furoate-vilanterol (BREO ELLIPTA) 200-25 MCG/INH AEPB INHALE 1 PUFF INTO THE LUNGS DAILY   levocetirizine (XYZAL) 5 MG tablet Take 5 mg by mouth every evening.   lisinopril-hydrochlorothiazide (ZESTORETIC) 20-12.5 MG tablet Take 2 tablets by mouth daily.   metoprolol succinate  (TOPROL-XL) 25 MG 24 hr tablet Take 1 tablet (25 mg total) by mouth daily. Take with or immediately following a meal.   montelukast (SINGULAIR) 10 MG tablet Take 1 tablet (10 mg total) by mouth at bedtime.   Multiple Vitamin (MULTIVITAMIN) tablet Take 1 tablet by mouth daily.   Multiple Vitamins-Minerals (VISION FORMULA PO) Take by mouth daily.    Probiotic Product (ALIGN) 4 MG CAPS Take by mouth daily.   rivaroxaban (XARELTO) 20 MG TABS tablet TAKE 1 TABLET(20 MG) BY MOUTH DAILY WITH SUPPER   rosuvastatin (CRESTOR) 10 MG tablet Take 1 tablet (10 mg total) by mouth daily.   triamcinolone cream (KENALOG) 0.1 % APPLY TOPICALLY TWICE DAILY   No facility-administered encounter medications on file as of 02/17/2021.    [place] Clinical Pharmacist Assistant [phone]

## 2021-02-17 NOTE — Progress Notes (Signed)
Patient returned my call needing to reschedule her appointment, she will be working later during the week. Offered new appointment in Dec, patient in agreement [place] Clinical Pharmacist Assistant [phone]

## 2021-02-18 ENCOUNTER — Telehealth: Payer: Medicare Other

## 2021-03-09 ENCOUNTER — Telehealth: Payer: Self-pay | Admitting: Pharmacist

## 2021-03-09 NOTE — Chronic Care Management (AMB) (Addendum)
Chronic Care Management Pharmacy Assistant   Name: Tameshia Bonneville  MRN: [id number] DOB: 08-Mar-1951  Reason for Encounter: Acute fill for Breo Inhaler Check in/ Medication Coordination Call   Recent office visits:  None  Recent consult visits:  None  Hospital visits:  None in previous 6 months  Medications: Outpatient Encounter Medications as of 03/09/2021  Medication Sig   albuterol (PROVENTIL) (2.5 MG/3ML) 0.083% nebulizer solution USE 1 VIAL VIA NEBULIZER EVERY 6 HOURS AS NEEDED FOR WHEEZING OR SHORTNESS OF BREATH   albuterol (VENTOLIN HFA) 108 (90 Base) MCG/ACT inhaler Inhale 2 puffs into the lungs every 6 (six) hours as needed for wheezing or shortness of breath.   ALPRAZolam (XANAX) 0.25 MG tablet TAKE 1 TO 2 TABLETS BY MOUTH DAILY AS NEEDED   famotidine (PEPCID) 20 MG tablet Take 20 mg by mouth daily.   flecainide (TAMBOCOR) 50 MG tablet Take 1 tablet (50 mg total) by mouth 2 (two) times daily.   FLUoxetine (PROZAC) 20 MG capsule TAKE 1 CAPSULE(20 MG) BY MOUTH DAILY   fluticasone (FLONASE) 50 MCG/ACT nasal spray Place 2 sprays into both nostrils as needed.   fluticasone furoate-vilanterol (BREO ELLIPTA) 200-25 MCG/INH AEPB INHALE 1 PUFF INTO THE LUNGS DAILY   levocetirizine (XYZAL) 5 MG tablet Take 5 mg by mouth every evening.   lisinopril-hydrochlorothiazide (ZESTORETIC) 20-12.5 MG tablet Take 2 tablets by mouth daily.   metoprolol succinate (TOPROL-XL) 25 MG 24 hr tablet Take 1 tablet (25 mg total) by mouth daily. Take with or immediately following a meal.   montelukast (SINGULAIR) 10 MG tablet Take 1 tablet (10 mg total) by mouth at bedtime.   Multiple Vitamin (MULTIVITAMIN) tablet Take 1 tablet by mouth daily.   Multiple Vitamins-Minerals (VISION FORMULA PO) Take by mouth daily.    Probiotic Product (ALIGN) 4 MG CAPS Take by mouth daily.   rivaroxaban (XARELTO) 20 MG TABS tablet TAKE 1 TABLET(20 MG) BY MOUTH DAILY WITH SUPPER   rosuvastatin (CRESTOR) 10 MG tablet  Take 1 tablet (10 mg total) by mouth daily.   triamcinolone cream (KENALOG) 0.1 % APPLY TOPICALLY TWICE DAILY   No facility-administered encounter medications on file as of 03/09/2021.   Reviewed chart for medication changes ahead of medication coordination call.  No OVs, Consults, or hospital visits since last care coordination call/Pharmacist visit.  No medication changes indicated  BP Readings from Last 3 Encounters:  01/08/21 121/78  11/14/20 126/64  10/01/20 118/72    No results found for: HGBA1C   Patient obtains medications through Adherence Packaging  30 Days   Last adherence delivery included:  Rosuvastatin (Crestor) 10 mg - Take one at Breakfast Fluoxetine (Prozac) 20 mg- Take one at Breakfast Lisinopril-HCTZ (Zestoric) 20-12.5 mg - Take two tablets at Breakfast Metropolol Succinate (Toprolol-XL) 25 mg - Take one at Breakfast Rivaroxaban (Xarelto) 20 mg - Take one at Evening Meal Flecainide (Tambocor) 50 mg - Take one at Breakfast and one at Bedtime Montelukast (Singulair) 10 mg - Take one at Bedtime   Added  Xanax 0.25 take 1-2 tabs as needed (Patient has 10 left on hand) Will need in 27 days 2 more Breo Inhalers( just got one on 02/08/21 is 90 DS customer updated form and making a task for reminder to check on)  Patient is due for next adherence delivery on: 03/23/2021. Called patient and reviewed medications and coordinated delivery. Confirmed Packaging for 30 DS  This delivery to include: Rosuvastatin (Crestor) 10 mg - Take one at Breakfast Flecainide (Tambocor)  50 mg - Take one at Breakfast and one at Bedtime Rivaroxaban (Xarelto) 20 mg - Take one at Evening Meal Montelukast (Singulair) 10 mg - Take one at Bedtime Metropolol Succinate (Toprolol-XL) 25 mg - Take one at Breakfast Lisinopril-HCTZ (Zestoric) 20-12.5 mg - Take two tablets at Breakfast Breo Inhaler Alprazolam (Xanax) - Take 1-2 tabs a day as needed  Confirmed delivery date of 03-23-21, advised  patient that pharmacy will contact her the morning of delivery.   Notes:  03/16/21 Spoke to patient she reports she is fine with 30 DS that she was sent and going forward with 30 DS, she reports she has enough of everything on hand to last until her delivery. Patient reports she will be working on date of delivery if pharmacy can not reach her on phone ok to bill card on file and leave meds on her door, noted on pharmacy form.   Care Gaps: TDAP - Overdue Zoster vaccine - Overdue Pneumovax - Overdue COVID Booster #4 [employer]) - Overdue CPP follow up call  scheduled 02/18/2021 1pm Last AWV 09/29/20 - Office aware to schedule BP- 126/64  Star Rating Drugs: Rosuvastatin 10 MG - Last filled 02/16/2021 90 DS at [facility] Lisinopril- HCTZ 20/12.5 mg - Last filled 9-/07/2020 90 DS at [facility] Pharmacist Assistant [phone]

## 2021-03-16 ENCOUNTER — Telehealth: Payer: Self-pay | Admitting: *Deleted

## 2021-03-16 NOTE — Telephone Encounter (Signed)
Our office received what looks like could be a clearance request though form states [place].   Pt is currently a pt of [facility]. Form from [facility] is checked off for Surgical Extractions, Routine Dental Care. I tried to call the dental office to clarify procedures being done. Dentist name is Serena Colonel, DMD, though dental office is closed today.

## 2021-03-17 NOTE — Telephone Encounter (Signed)
I s/w Claiborne Billings with [facility]. I confirmed pre op information needed.      [facility] Pre-operative Risk Assessment    Patient Name: Glenda Bauer  DOB: 18-Jun-1950 MRN: [id number]  HEARTCARE STAFF:  - IMPORTANT!!!!!! Under Visit Info/Reason for Call, type in Other and utilize the format Clearance MM/DD/YY or Clearance TBD. Do not use dashes or single digits. - Please review there is not already an duplicate clearance open for this procedure. - If request is for dental extraction, please clarify the # of teeth to be extracted. - If the patient is currently at the dentist's office, call Pre-Op Callback Staff (MA/nurse) to input urgent request.  - If the patient is not currently in the dentist office, please route to the Pre-Op pool.  Request for surgical clearance:  What type of surgery is being performed? PROCEDURE TO BE DONE AT THIS TIME IS: CROWN BUILD UP; CONFIRMED THERE ARE NO TEETH TO BE EXTRACTED AT THIS TIME; PT WILL HAVE A CLEANING IN 07/2021; ASKED FOR NEW CLEARANCE TO BE SENT FOR CLEANING AT THAT TIME  When is this surgery scheduled? TBD  What type of clearance is required (medical clearance vs. Pharmacy clearance to hold med vs. Both)? BOTH  Are there any medications that need to be held prior to surgery and how long? [facility]  Practice name and name of physician performing surgery? [facility]; DR. Serena Colonel, DMD  What is the office phone number? [phone]   7.   What is the office fax number? [phone]  8.   Anesthesia type (None, local, MAC, general) ? NONE TO BE USED PER [facility] WITH DENTAL OFFICE   Julaine Hua 03/17/2021, 11:26 AM  _________________________________________________________________   (provider comments below)  Crown build

## 2021-03-18 NOTE — Telephone Encounter (Signed)
Recommend continuing anticoagulation for crowns. Pt does not require SBE prophylaxis.

## 2021-03-18 NOTE — Telephone Encounter (Signed)
   Patient Name: Glenda Bauer  DOB: 03-19-1951 MRN: [id number]  Primary Cardiologist: [name], MD  Chart reviewed as part of pre-operative protocol coverage.   Simple dental extractions and crown are considered low risk procedures per guidelines and generally do not require any specific cardiac clearance. It is also generally accepted that for simple extractions and dental cleanings, there is no need to interrupt blood thinner therapy.  We recommend continue blood thinner through the procedure.   SBE prophylaxis is not required for the patient from a cardiac standpoint.  I will route this recommendation to the requesting party via Epic fax function and remove from pre-op pool.  Please call with questions.  [place], [place] 03/18/2021, 3:32 PM

## 2021-03-18 NOTE — Telephone Encounter (Signed)
I confirmed with the dentist office.  She has 2 upcoming appointments, one for dental cleaning and hygiene and second appt is for dental crown on 6 teeth.  Unclear if Crown procedure requires holding Xarelto, will check with our clinical pharmacist.

## 2021-03-23 NOTE — Progress Notes (Signed)
Per Pharmacy notification patient charge will be 252.00 called patient to inform and see if they should still proceed left vm.  03/23/21 Patient returned my call advised it would be fine for this month, asked if she can get going forward generic for her medications breo-specifically as they are tight with her budget and brand names. Patient was denied for patient assistance for this medication this year.  Message sent to [place] Pharmacist Assistant [phone]

## 2021-03-24 NOTE — Progress Notes (Signed)
Per [name] call to advise patient that we may be able to attempt patient assistance for Symbicort and patient may be able to use a Xarelto co pay card that is only to be used once in her lifetime, call to patient left voicemail for return call to see if she would be interested in any of the above. Patient is on generics for other medications already per CPP.  [place] Clinical Pharmacist Assistant [phone]

## 2021-04-10 ENCOUNTER — Telehealth: Payer: Self-pay | Admitting: Pharmacist

## 2021-04-10 NOTE — Chronic Care Management (AMB) (Addendum)
Chronic Care Management Pharmacy Assistant   Name: Glenda Bauer  MRN: [id number] DOB: 16-Jul-1950  Reason for Encounter: Medication Review General Assessment Call    Conditions to be addressed/monitored: HLD  Recent office visits:  None  Recent consult visits:  None  Hospital visits:  None in previous 6 months  Medications: Outpatient Encounter Medications as of 04/10/2021  Medication Sig   albuterol (PROVENTIL) (2.5 MG/3ML) 0.083% nebulizer solution USE 1 VIAL VIA NEBULIZER EVERY 6 HOURS AS NEEDED FOR WHEEZING OR SHORTNESS OF BREATH   albuterol (VENTOLIN HFA) 108 (90 Base) MCG/ACT inhaler Inhale 2 puffs into the lungs every 6 (six) hours as needed for wheezing or shortness of breath.   ALPRAZolam (XANAX) 0.25 MG tablet TAKE 1 TO 2 TABLETS BY MOUTH DAILY AS NEEDED   famotidine (PEPCID) 20 MG tablet Take 20 mg by mouth daily.   flecainide (TAMBOCOR) 50 MG tablet Take 1 tablet (50 mg total) by mouth 2 (two) times daily.   FLUoxetine (PROZAC) 20 MG capsule TAKE 1 CAPSULE(20 MG) BY MOUTH DAILY   fluticasone (FLONASE) 50 MCG/ACT nasal spray Place 2 sprays into both nostrils as needed.   fluticasone furoate-vilanterol (BREO ELLIPTA) 200-25 MCG/INH AEPB INHALE 1 PUFF INTO THE LUNGS DAILY   levocetirizine (XYZAL) 5 MG tablet Take 5 mg by mouth every evening.   lisinopril-hydrochlorothiazide (ZESTORETIC) 20-12.5 MG tablet Take 2 tablets by mouth daily.   metoprolol succinate (TOPROL-XL) 25 MG 24 hr tablet Take 1 tablet (25 mg total) by mouth daily. Take with or immediately following a meal.   montelukast (SINGULAIR) 10 MG tablet Take 1 tablet (10 mg total) by mouth at bedtime.   Multiple Vitamin (MULTIVITAMIN) tablet Take 1 tablet by mouth daily.   Multiple Vitamins-Minerals (VISION FORMULA PO) Take by mouth daily.    Probiotic Product (ALIGN) 4 MG CAPS Take by mouth daily.   rivaroxaban (XARELTO) 20 MG TABS tablet TAKE 1 TABLET(20 MG) BY MOUTH DAILY WITH SUPPER   rosuvastatin  (CRESTOR) 10 MG tablet Take 1 tablet (10 mg total) by mouth daily.   triamcinolone cream (KENALOG) 0.1 % APPLY TOPICALLY TWICE DAILY   No facility-administered encounter medications on file as of 04/10/2021.  04/10/2021 Name: Glenda Bauer MRN: [id number] DOB: Jul 30, 1950 Glenda Bauer is a 70 y.o. year old female who is a primary care patient of [name], NP.  Comprehensive medication review performed; Spoke to patient regarding cholesterol  Lipid Panel    Component Value Date/Time   CHOL 122 01/09/2021 0911   TRIG 49 01/09/2021 0911   HDL 65 01/09/2021 0911   LDLCALC 45 01/09/2021 0911   LDLCALC 110 (H) 03/27/2020 0917    10-year ASCVD risk score: The ASCVD Risk score (Arnett DK, et al., 2019) failed to calculate for the following reasons:   The valid total cholesterol range is 130 to 320 mg/dL  Current antihyperlipidemic regimen:  Simvastatin 10 mg 1 tablet daily Previous antihyperlipidemic medications tried: None ASCVD risk enhancing conditions: age >29 and HTN What recent interventions/DTPs have been made by any provider to improve Cholesterol control since last CPP Visit: Patient reports no changes Any recent hospitalizations or ED visits since last visit with CPP? No What exercise is being done to improve Cholesterol?  Patient reports she is still working part time and is active doing so  Adherence Review: Does the patient have >5 day gap between last estimated fill dates? No   Reviewed chart for medication changes ahead of medication coordination call.  No OVs, Consults,  or hospital visits since last care coordination call/Pharmacist visit.   No medication changes indicated. BP Readings from Last 3 Encounters:  01/08/21 121/78  11/14/20 126/64  10/01/20 118/72    No results found for: HGBA1C   Patient obtains medications through Adherence Packaging  30 Days   Last adherence delivery included:  Rosuvastatin (Crestor) 10 mg - Take one at  Breakfast Flecainide (Tambocor) 50 mg - Take one at Breakfast and one at Bedtime Rivaroxaban (Xarelto) 20 mg - Take one at Evening Meal Montelukast (Singulair) 10 mg - Take one at Bedtime Metropolol Succinate (Toprolol-XL) 25 mg - Take one at Breakfast Lisinopril-HCTZ (Zestoric) 20-12.5 mg - Take two tablets at Breakfast Breo Inhaler Alprazolam (Xanax) - Take 1-2 tabs a day as needed   Patient is due for next adherence delivery on: 04/22/21. Called patient and reviewed medications and coordinated delivery. Confirmed Packaging for 30 DS  This delivery to include: Rosuvastatin (Crestor) 10 mg - Take one at Breakfast Flecainide (Tambocor) 50 mg - Take one at Breakfast and one at Bedtime Fluoxetine (Prozac) 20 mg - Take one at Breakfast Rivaroxaban (Xarelto) 20 mg - Take one at Evening Meal Montelukast (Singulair) 10 mg - Take one at Bedtime Metropolol Succinate (Toprolol-XL) 25 mg - Take one at Breakfast Lisinopril-HCTZ (Zestoric) 20-12.5 mg - Take two tablets at Breakfast Breo Inhaler Alprazolam (Xanax) - Take 1-2 tabs a day as needed  Confirmed delivery date of 04/22/21, advised patient that pharmacy will contact them the morning of delivery.   Patient Confirmed she is not in need of any of her albuterol.  Notes:  Patient reports she would like to go ahead and proceed with the patient assistance application as outlined in prior note for Symbicort. Advised she has an appointment with the Pharmacist next month and she may bring her financial information and receipts of oop spend with her to that appointment to address with Pharmacist, and that she may also speak about the xarelto co pay card at that time. Patient was in agreement as she states her financial standing has changed drastically.  Care Gaps: TDAP - Overdue Zoster vaccine - Overdue Pneumovax - Overdue COVID Booster #4 [employer]) - Overdue CPP follow up call  scheduled 12/22 Last AWV 09/29/20 - Office aware to schedule BP-  126/64 Symbicort PAP** in process after dec appt  Star Rating Drugs: Rosuvastatin 10 MG - Last filled 03/16/2021 90 DS at [facility] Lisinopril- HCTZ 20/12.5 mg - Last filled 03/16/2021 90 DS at [facility] [phone]  Applied for patient assistance for the switch from [facility] to Symbicort. Patient was approved until December 2023 and will fax prescription to [employer].  [name], PharmD, [place] Pharmacist [place] at [facility]

## 2021-04-15 ENCOUNTER — Other Ambulatory Visit: Payer: Self-pay | Admitting: Adult Health

## 2021-04-15 MED ORDER — BUDESONIDE-FORMOTEROL FUMARATE 160-4.5 MCG/ACT IN AERO: 2.0000 | INHALATION_SPRAY | Freq: Two times a day (BID) | RESPIRATORY_TRACT | 11 refills | Status: DC

## 2021-04-15 NOTE — Progress Notes (Signed)
Switch from Breo to Symbicort due to financial restraints

## 2021-04-16 NOTE — Telephone Encounter (Signed)
Pt needs an appt. For CPE for further refills. Rx will be filled for 30 days.

## 2021-04-16 NOTE — Telephone Encounter (Signed)
Per Cory's approval

## 2021-05-08 ENCOUNTER — Other Ambulatory Visit: Payer: Self-pay | Admitting: Adult Health

## 2021-05-08 ENCOUNTER — Encounter: Payer: Self-pay | Admitting: Adult Health

## 2021-05-08 ENCOUNTER — Ambulatory Visit (INDEPENDENT_AMBULATORY_CARE_PROVIDER_SITE_OTHER): Payer: Medicare Other | Admitting: Adult Health

## 2021-05-08 VITALS — BP 114/70 | HR 65 | Temp 97.9°F | Ht 66.0 in | Wt 186.0 lb

## 2021-05-08 DIAGNOSIS — I480 Paroxysmal atrial fibrillation: Secondary | ICD-10-CM

## 2021-05-08 DIAGNOSIS — Z0000 Encounter for general adult medical examination without abnormal findings: Secondary | ICD-10-CM | POA: Diagnosis not present

## 2021-05-08 DIAGNOSIS — Z1329 Encounter for screening for other suspected endocrine disorder: Secondary | ICD-10-CM | POA: Diagnosis not present

## 2021-05-08 DIAGNOSIS — F419 Anxiety disorder, unspecified: Secondary | ICD-10-CM

## 2021-05-08 DIAGNOSIS — F32A Depression, unspecified: Secondary | ICD-10-CM | POA: Diagnosis not present

## 2021-05-08 DIAGNOSIS — I4891 Unspecified atrial fibrillation: Secondary | ICD-10-CM | POA: Diagnosis not present

## 2021-05-08 DIAGNOSIS — J4540 Moderate persistent asthma, uncomplicated: Secondary | ICD-10-CM | POA: Diagnosis not present

## 2021-05-08 DIAGNOSIS — I10 Essential (primary) hypertension: Secondary | ICD-10-CM | POA: Diagnosis not present

## 2021-05-08 LAB — CBC WITH DIFFERENTIAL/PLATELET
Basophils Absolute: 0.1 10*3/uL (ref 0.0–0.1)
Basophils Relative: 1.8 % (ref 0.0–3.0)
Eosinophils Absolute: 0.3 10*3/uL (ref 0.0–0.7)
Eosinophils Relative: 5.7 % — ABNORMAL HIGH (ref 0.0–5.0)
HCT: 42.4 % (ref 36.0–46.0)
Hemoglobin: 14.4 g/dL (ref 12.0–15.0)
Lymphocytes Relative: 29.5 % (ref 12.0–46.0)
Lymphs Abs: 1.5 10*3/uL (ref 0.7–4.0)
MCHC: 34.1 g/dL (ref 30.0–36.0)
MCV: 88.4 fl (ref 78.0–100.0)
Monocytes Absolute: 0.5 10*3/uL (ref 0.1–1.0)
Monocytes Relative: 9.2 % (ref 3.0–12.0)
Neutro Abs: 2.7 10*3/uL (ref 1.4–7.7)
Neutrophils Relative %: 53.8 % (ref 43.0–77.0)
Platelets: 229 10*3/uL (ref 150.0–400.0)
RBC: 4.8 Mil/uL (ref 3.87–5.11)
RDW: 13.3 % (ref 11.5–15.5)
WBC: 5.1 10*3/uL (ref 4.0–10.5)

## 2021-05-08 LAB — COMPREHENSIVE METABOLIC PANEL
ALT: 29 U/L (ref 0–35)
AST: 21 U/L (ref 0–37)
Albumin: 4.2 g/dL (ref 3.5–5.2)
Alkaline Phosphatase: 50 U/L (ref 39–117)
BUN: 20 mg/dL (ref 6–23)
CO2: 28 mEq/L (ref 19–32)
Calcium: 10.1 mg/dL (ref 8.4–10.5)
Chloride: 104 mEq/L (ref 96–112)
Creatinine, Ser: 0.92 mg/dL (ref 0.40–1.20)
GFR: 62.3 mL/min (ref >60.00)
Glucose, Bld: 99 mg/dL (ref 70–99)
Potassium: 4 mEq/L (ref 3.5–5.1)
Sodium: 140 mEq/L (ref 135–145)
Total Bilirubin: 0.8 mg/dL (ref 0.2–1.2)
Total Protein: 7.1 g/dL (ref 6.0–8.3)

## 2021-05-08 LAB — TSH: TSH: 0.86 u[IU]/mL (ref 0.35–5.50)

## 2021-05-08 LAB — LIPID PANEL
Cholesterol: 145 mg/dL (ref 0–200)
HDL: 68.8 mg/dL (ref >39.00)
LDL Cholesterol: 61 mg/dL (ref 0–99)
NonHDL: 76.52
Total CHOL/HDL Ratio: 2
Triglycerides: 79 mg/dL (ref 0.0–149.0)
VLDL: 15.8 mg/dL (ref 0.0–40.0)

## 2021-05-08 LAB — HEMOGLOBIN A1C: Hgb A1c MFr Bld: 5.6 % (ref 4.6–6.5)

## 2021-05-08 MED ORDER — ALBUTEROL SULFATE (2.5 MG/3ML) 0.083% IN NEBU: INHALATION_SOLUTION | RESPIRATORY_TRACT | 0 refills | Status: DC

## 2021-05-08 MED ORDER — ALPRAZOLAM 0.25 MG PO TABS: ORAL_TABLET | ORAL | 0 refills | Status: DC

## 2021-05-08 MED ORDER — FLUOXETINE HCL 20 MG PO CAPS: ORAL_CAPSULE | ORAL | 1 refills | Status: DC

## 2021-05-08 NOTE — Progress Notes (Signed)
Subjective:    Patient ID: Glenda Bauer, female    DOB: 10-09-1950, 70 y.o.   MRN: [id number]  HPI Patient presents for yearly preventative medicine examination. 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  Essential hypertension-maintained with lisinopril/hydrochlorothiazide 20-12.5 mg.  Her blood pressures have been well controlled in the past.  She denies lightheadedness, dizziness, headaches, blurred vision, or chest pain  BP Readings from Last 3 Encounters:  05/08/21 114/70  01/08/21 121/78  11/14/20 126/64   Asthma-well controlled. Recently switched from [facility] to Symbicort.  She does use an albuterol inhaler infrequently.  Also takes Singulair daily.  Denies shortness of breath past her baseline  Anxiety/depression-is currently prescribed Prozac 20 mg and Xanax 0.25 mg as needed.  She does feel well controlled on this medication.  Fibromyalgia-symptoms controlled with Prozac 20 mg daily  Cutaneous T-cell lymphoma- Was unable to get into [facility]. She has an appointment with Dermatology coming up. She denies any flares.   Hyperlipidemia -takes Crestor 10mg  daily. Lab Results  Component Value Date   CHOL 122 01/09/2021   HDL 65 01/09/2021   LDLCALC 45 01/09/2021   TRIG 49 01/09/2021   CHOLHDL 1.9 01/09/2021   PAF -managed by [facility].  Currently prescribed flecainide 50 mg twice daily, Toprol 25 mg daily and Xarelto 20 mg daily. She denies palpitations or chest pain. Does feel more fatigued since starting medications for PAF.    All immunizations and health maintenance protocols were reviewed with the patient and needed orders were  placed.  Appropriate screening laboratory values were ordered for the patient including screening of hyperlipidemia, renal function and hepatic function.  Medication reconciliation,  past medical history, social history, problem list and allergies were reviewed in detail with the patient  Goals were established with regard to weight loss, exercise, and  diet in compliance with medications. She did not exercise much over the summer.  Wt Readings from Last 3 Encounters:  05/08/21 186 lb (84.4 kg)  01/08/21 182 lb (82.6 kg)  11/14/20 182 lb (82.6 kg)    Review of Systems  Constitutional:  Positive for fatigue.  HENT: Negative.    Eyes: Negative.   Respiratory: Negative.    Cardiovascular: Negative.   Gastrointestinal: Negative.   Endocrine: Negative.   Genitourinary: Negative.   Musculoskeletal: Negative.   Skin: Negative.   Allergic/Immunologic: Negative.   Neurological: Negative.   Hematological: Negative.   Psychiatric/Behavioral: Negative.     Past Medical History:  Diagnosis Date   Allergy    Anxiety    Asthma    Atrial fibrillation ([place])    Cancer (HCC)    Melanoma and Mycosis Fungosis   Cataract    Cutaneous T-cell lymphoma ([place]) 2011   Dermatologist treated in [place]; Dr. Hipolito Bayley   Depression    GERD (gastroesophageal reflux disease)    Heart murmur    Hypertension    Neuromuscular disorder ([place])    Osteoarthritis    Sebaceous cyst 2022   right vulva   Thyroid nodule    Urinary tract infection     Social History   Socioeconomic History   Marital status: Divorced    Spouse name: Not on file   Number of children: 3  Years of education: 89   Highest education level: Associate degree: academic program  Occupational History   Occupation: Medical sales representative   Tobacco Use   Smoking status: Former    Packs/day: 0.25    Years: 10.00    Pack years: 2.50    Types: Cigarettes   Smokeless tobacco: Never   Tobacco comments:    35 years ago; smoke socially   Vaping Use   Vaping Use: Never used  Substance and Sexual Activity   Alcohol use: No    Alcohol/week: 0.0 standard drinks   Drug use: No   Sexual activity: Not Currently    Birth control/protection: Post-menopausal  Other Topics Concern   Not on file  Social History Narrative   Separated    Three children ( one lives locally)    Social Determinants of Health   Financial Resource Strain: Low Risk    Difficulty of Paying Living Expenses: Not hard at all  Food Insecurity: No Food Insecurity   Worried About Charity fundraiser in the Last Year: Never true   Arboriculturist in the Last Year: Never true  Transportation Needs: No Transportation Needs   Lack of Transportation (Medical): No   Lack of Transportation (Non-Medical): No  Physical Activity: Insufficiently Active   Days of Exercise per Week: 5 days   Minutes of Exercise per Session: 20 min  Stress: Stress Concern Present   Feeling of Stress : Rather much  Social Connections: Moderately Isolated   Frequency of Communication with Friends and Family: More than three times a week   Frequency of Social Gatherings with Friends and Family: Twice a week   Attends Religious Services: Never   Marine scientist or Organizations: No   Attends Music therapist: 1 to 4 times per year   Marital Status: Divorced  Human resources officer Violence: Not At Risk   Fear of Current or Ex-Partner: No   Emotionally Abused: No   Physically Abused: No   Sexually Abused: No    Past Surgical History:  Procedure Laterality Date   Cataracts Bilateral    CHOLECYSTECTOMY  2015   melenoma removal  1979   Sinus Polyps       Family History  Problem Relation Age of Onset   Arthritis Mother    Ovarian cancer Mother        Metastatic   Breast cancer Mother 62   Thyroid disease Mother    Parkinson's disease Father    Crohn's disease Brother    Schizophrenia Brother    Colon cancer Neg Hx    Esophageal cancer Neg Hx    Rectal  cancer Neg Hx    Stomach cancer Neg Hx     No Known Allergies  Current Outpatient Medications on File Prior to Visit  Medication Sig Dispense Refill   albuterol (VENTOLIN HFA) 108 (90 Base) MCG/ACT inhaler Inhale 2 puffs into the lungs every 6 (six) hours as needed for wheezing or shortness of breath. 8 g 2   budesonide-formoterol (SYMBICORT) 160-4.5 MCG/ACT inhaler Inhale 2 puffs into the lungs 2 (two) times daily. 1 each 11   famotidine (PEPCID) 20 MG tablet Take 20 mg by mouth daily.     flecainide (TAMBOCOR) 50 MG tablet Take 1 tablet (50 mg total) by mouth 2 (two) times daily. 60 tablet 5   fluticasone (FLONASE) 50 MCG/ACT nasal spray Place 2 sprays into both nostrils as needed.     levocetirizine (XYZAL) 5 MG tablet Take  5 mg by mouth every evening.     lisinopril-hydrochlorothiazide (ZESTORETIC) 20-12.5 MG tablet Take 2 tablets by mouth daily. 180 tablet 2   metoprolol succinate (TOPROL-XL) 25 MG 24 hr tablet Take 1 tablet (25 mg total) by mouth daily. Take with or immediately following a meal. 90 tablet 3   montelukast (SINGULAIR) 10 MG tablet Take 1 tablet (10 mg total) by mouth at bedtime. 90 tablet 3   Multiple Vitamin (MULTIVITAMIN) tablet Take 1 tablet by mouth daily.     Multiple Vitamins-Minerals (VISION FORMULA PO) Take by mouth daily.      Probiotic Product (ALIGN) 4 MG CAPS Take by mouth daily.     rivaroxaban (XARELTO) 20 MG TABS tablet TAKE 1 TABLET(20 MG) BY MOUTH DAILY WITH SUPPER 90 tablet 3   rosuvastatin (CRESTOR) 10 MG tablet Take 1 tablet (10 mg total) by mouth daily. 90 tablet 2   triamcinolone cream (KENALOG) 0.1 % APPLY TOPICALLY TWICE DAILY 454 g 0   No current facility-administered medications on file prior to visit.    BP 114/70 (BP Location: Left Arm, Patient Position: Sitting, Cuff Size: Normal)   Pulse 65   Temp 97.9 F (36.6 C) (Oral)   Ht 5\' 6"  (1.676 m)   Wt 186 lb (84.4 kg)   LMP 06/07/2000 (Approximate)   SpO2 95%   BMI 30.02 kg/m         Objective:   Physical Exam Vitals and nursing note reviewed.  Constitutional:      General: She is not in acute distress.    Appearance: Normal appearance. She is well-developed and overweight. She is not ill-appearing.  HENT:     Head: Normocephalic and atraumatic.     Right Ear: Tympanic membrane, ear canal and external ear normal. There is no impacted cerumen.     Left Ear: Tympanic membrane, ear canal and external ear normal. There is no impacted cerumen.     Nose: Nose normal. No congestion or rhinorrhea.     Mouth/Throat:     Mouth: Mucous membranes are moist.     Pharynx: Oropharynx is clear. No oropharyngeal exudate or posterior oropharyngeal erythema.  Eyes:     General:        Right eye: No discharge.        Left eye: No discharge.     Extraocular Movements: Extraocular movements intact.     Conjunctiva/sclera: Conjunctivae normal.     Pupils: Pupils are equal, round, and reactive to light.  Neck:     Thyroid: No thyromegaly.     Vascular: No carotid bruit.     Trachea: No tracheal deviation.  Cardiovascular:     Rate and Rhythm: Normal rate and regular rhythm.     Pulses: Normal pulses.     Heart sounds: Normal heart sounds. No murmur heard.   No friction rub. No gallop.  Pulmonary:     Effort: Pulmonary effort is normal. No respiratory distress.     Breath sounds: Normal breath sounds. No stridor. No wheezing, rhonchi or rales.  Chest:     Chest wall: No tenderness.  Abdominal:     General: Abdomen is flat. Bowel sounds are normal. There is no distension.     Palpations: Abdomen is soft. There is no mass.     Tenderness: There is no abdominal tenderness. There is no right CVA tenderness, left CVA tenderness, guarding or rebound.     Hernia: No hernia is present.  Musculoskeletal:  General: No swelling, tenderness, deformity or signs of injury. Normal range of motion.     Cervical back: Normal range of motion and neck supple.     Right lower leg: No  edema.     Left lower leg: No edema.  Lymphadenopathy:     Cervical: No cervical adenopathy.  Skin:    General: Skin is warm and dry.     Coloration: Skin is not jaundiced or pale.     Findings: No bruising, erythema, lesion or rash.  Neurological:     General: No focal deficit present.     Mental Status: She is alert and oriented to person, place, and time.     Cranial Nerves: No cranial nerve deficit.     Sensory: No sensory deficit.     Motor: No weakness.     Coordination: Coordination normal.     Gait: Gait normal.     Deep Tendon Reflexes: Reflexes normal.  Psychiatric:        Mood and Affect: Mood normal.        Behavior: Behavior normal.        Thought Content: Thought content normal.        Judgment: Judgment normal.      Assessment & Plan:   1. Routine general medical examination at a health care facility - Encouraged heart healthy diet and frequent exercise  - CBC with Differential/Platelet; Future - Comprehensive metabolic panel; Future - Lipid panel; Future - TSH; Future - Hemoglobin A1c; Future  2. Essential hypertension - Well controlled. No change in medications  - CBC with Differential/Platelet; Future - Comprehensive metabolic panel; Future - Lipid panel; Future - TSH; Future  3. Moderate persistent chronic asthma without complication - Continue with Symbicrot and albuterol inhaler  - CBC with Differential/Platelet; Future - Comprehensive metabolic panel; Future - Lipid panel; Future - TSH; Future  4. Atrial fibrillation, unspecified type ([place]) - Follow up with Afib clinic - sinus rhythm today  - CBC with Differential/Platelet; Future - Comprehensive metabolic panel; Future - Lipid panel; Future - TSH; Future  5. Anxiety and depression - No change in medications  - CBC with Differential/Platelet; Future - Comprehensive metabolic panel; Future - Lipid panel; Future - TSH; Future - FLUoxetine (PROZAC) 20 MG capsule; TAKE 1 CAPSULE(20 MG)  BY MOUTH DAILY  Dispense: 90 capsule; Refill: 1 - ALPRAZolam (XANAX) 0.25 MG tablet; TAKE 1 OR 2 TABLETS BY MOUTH ONCE daily as needed  Dispense: 30 tablet; Refill: 0   6. Paroxysmal atrial fibrillation (HCC) - Follow up with Cardiology as directed - CBC with Differential/Platelet; Future - Comprehensive metabolic panel; Future - Lipid panel; Future - TSH; Future   [name], NP

## 2021-05-08 NOTE — Patient Instructions (Addendum)
It was great seeing you today   We will follow up with you regarding your lab work   Please let me know if you need anything   I will see you back in one year or sooner if needed

## 2021-05-11 ENCOUNTER — Telehealth: Payer: Self-pay | Admitting: Pharmacist

## 2021-05-11 NOTE — Chronic Care Management (AMB) (Signed)
Chronic Care Management Pharmacy Assistant   Name: Glenda Bauer  MRN: [id number] DOB: Jan 17, 1951  Reason for Encounter: Medication Review Medication Coordination   Conditions to be addressed/monitored: HTN  Recent office visits:  05/08/21 [name], NP - Patient presented for Routine general medical exam and other concerns. No medication changes.  Recent consult visits:  None  Hospital visits:  None in previous 6 months  Medications: Outpatient Encounter Medications as of 05/11/2021  Medication Sig   albuterol (PROVENTIL) (2.5 MG/3ML) 0.083% nebulizer solution USE 1 VIAL VIA NEBULIZER EVERY 6 HOURS AS NEEDED FOR WHEEZING OR SHORTNESS OF BREATH   albuterol (VENTOLIN HFA) 108 (90 Base) MCG/ACT inhaler Inhale 2 puffs into the lungs every 6 (six) hours as needed for wheezing or shortness of breath.   ALPRAZolam (XANAX) 0.25 MG tablet TAKE 1 OR 2 TABLETS BY MOUTH ONCE daily as needed   budesonide-formoterol (SYMBICORT) 160-4.5 MCG/ACT inhaler Inhale 2 puffs into the lungs 2 (two) times daily.   famotidine (PEPCID) 20 MG tablet Take 20 mg by mouth daily.   flecainide (TAMBOCOR) 50 MG tablet Take 1 tablet (50 mg total) by mouth 2 (two) times daily.   FLUoxetine (PROZAC) 20 MG capsule TAKE 1 CAPSULE(20 MG) BY MOUTH DAILY   fluticasone (FLONASE) 50 MCG/ACT nasal spray Place 2 sprays into both nostrils as needed.   levocetirizine (XYZAL) 5 MG tablet Take 5 mg by mouth every evening.   lisinopril-hydrochlorothiazide (ZESTORETIC) 20-12.5 MG tablet Take 2 tablets by mouth daily.   metoprolol succinate (TOPROL-XL) 25 MG 24 hr tablet Take 1 tablet (25 mg total) by mouth daily. Take with or immediately following a meal.   montelukast (SINGULAIR) 10 MG tablet Take 1 tablet (10 mg total) by mouth at bedtime.   Multiple Vitamin (MULTIVITAMIN) tablet Take 1 tablet by mouth daily.   Multiple Vitamins-Minerals (VISION FORMULA PO) Take by mouth daily.    Probiotic Product (ALIGN) 4 MG CAPS  Take by mouth daily.   rivaroxaban (XARELTO) 20 MG TABS tablet TAKE 1 TABLET(20 MG) BY MOUTH DAILY WITH SUPPER   rosuvastatin (CRESTOR) 10 MG tablet Take 1 tablet (10 mg total) by mouth daily.   triamcinolone cream (KENALOG) 0.1 % APPLY TOPICALLY TWICE DAILY   No facility-administered encounter medications on file as of 05/11/2021.  Reviewed chart prior to disease state call. Spoke with patient regarding BP  Recent Office Vitals: BP Readings from Last 3 Encounters:  05/08/21 114/70  01/08/21 121/78  11/14/20 126/64   Pulse Readings from Last 3 Encounters:  05/08/21 65  01/08/21 61  11/14/20 64    Wt Readings from Last 3 Encounters:  05/08/21 186 lb (84.4 kg)  01/08/21 182 lb (82.6 kg)  11/14/20 182 lb (82.6 kg)     Kidney Function Lab Results  Component Value Date/Time   CREATININE 0.92 05/08/2021 09:34 AM   CREATININE 0.92 11/12/2020 11:05 AM   CREATININE 0.89 03/27/2020 09:17 AM   GFR 62.30 05/08/2021 09:34 AM   GFRNONAA 59 (L) 05/21/2020 11:21 AM   GFRNONAA >60 04/17/2020 12:30 PM   GFRNONAA 65 03/27/2020 09:17 AM   GFRAA 68 05/21/2020 11:21 AM   GFRAA 76 03/27/2020 09:17 AM    BMP Latest Ref Rng & Units 05/08/2021 11/12/2020 05/21/2020  Glucose 70 - 99 mg/dL 99 122(H) 106(H)  BUN 6 - 23 mg/dL 20 16 30(H)  Creatinine 0.40 - 1.20 mg/dL 0.92 0.92 0.97  BUN/Creat Ratio 12 - 28 - 17 31(H)  Sodium 135 - 145 mEq/L 140  141 137  Potassium 3.5 - 5.1 mEq/L 4.0 3.6 3.9  Chloride 96 - 112 mEq/L 104 104 100  CO2 19 - 32 mEq/L 28 21 23   Calcium 8.4 - 10.5 mg/dL 10.1 8.8 9.7    Current antihypertensive regimen:  Lisinopril - HCTZ 20-12.5 1.5 tablets daily (rx said 2 tablets daily) - every 2 hours going to the bathroom Metoprolol succinate 50 mg 1/2 tablet daily How often are you checking your Blood Pressure? weekly Current home BP readings: Patient reports her recent office reading on 12/2 of 114/70 she states that's typical of her home average, denies any dizziness or  lightheadedness.  What recent interventions/DTPs have been made by any provider to improve Blood Pressure control since last CPP Visit: Patient reports no change Any recent hospitalizations or ED visits since last visit with CPP? No  Adherence Review: Is the patient currently on ACE/ARB medication? Yes Does the patient have >5 day gap between last estimated fill dates? No  Reviewed chart for medication changes ahead of medication coordination call.  No OVs, Consults, or hospital visits since last care coordination call/Pharmacist visit.   No medication changes indicated  BP Readings from Last 3 Encounters:  05/08/21 114/70  01/08/21 121/78  11/14/20 126/64    Lab Results  Component Value Date   HGBA1C 5.6 05/08/2021     Patient obtains medications through Adherence Packaging  30 Days   Last adherence delivery included:  Rosuvastatin (Crestor) 10 mg - Take one at Breakfast Flecainide (Tambocor) 50 mg - Take one at Breakfast and one at Bedtime Fluoxetine (Prozac) 20 mg - Take one at Breakfast Rivaroxaban (Xarelto) 20 mg - Take one at Evening Meal Montelukast (Singulair) 10 mg - Take one at Bedtime Metropolol Succinate (Toprolol-XL) 25 mg - Take one at Breakfast Lisinopril-HCTZ (Zestoric) 20-12.5 mg - Take two tablets at Breakfast Breo Inhaler Alprazolam (Xanax) - Take 1-2 tabs a day as needed  Patient is due for next adherence delivery on: 05/21/21. Called patient and reviewed medications and coordinated delivery. Confirmed Packaging for 30 DS  This delivery to include: Rosuvastatin (Crestor) 10 mg - Take one at Breakfast Flecainide (Tambocor) 50 mg - Take one at Breakfast and one at Bedtime Rivaroxaban (Xarelto) 20 mg - Take one at Evening Meal Fluoxetine (Prozac) 20 mg - Take one at Breakfast Metropolol Succinate (Toprolol-XL) 25 mg - Take one at Breakfast Montelukast (Singulair) 10 mg - Take one at Bedtime Lisinopril-HCTZ (Zestoric) 20-12.5 mg - Take two tablets at  Breakfast Alprazolam (Xanax) - Take 1-2 tabs a day as needed Albuterol 2.5 mg - inhale contents of 1 vial every 6 hours via nebulizer as needed    Confirmed delivery date of 05/21/21, advised patient that pharmacy will contact them the morning of delivery.    Care Gaps: TDAP - Overdue Zoster vaccine - Overdue Pneumovax - Overdue CPP follow up call  scheduled 12/22 Last AWV 09/29/20 - Office aware to schedule BP- 114/70 (05/08/21)  Star Rating Drugs: Rosuvastatin 10 MG - Last filled 03/16/2021 90 DS at [facility] Lisinopril- HCTZ 20/12.5 mg - Last filled 03/16/2021 90 DS at [facility]    Patient Assistance: Symbicort PAP** in process after dec appt (Patient given 90 DS)  Ned Clines [place] Pharmacist Assistant [phone]

## 2021-05-13 ENCOUNTER — Other Ambulatory Visit: Payer: Self-pay

## 2021-05-13 ENCOUNTER — Ambulatory Visit (HOSPITAL_COMMUNITY)
Admission: RE | Admit: 2021-05-13 | Discharge: 2021-05-13 | Disposition: A | Discharge status: Home / Self Care | Payer: Medicare Other | Source: Ambulatory Visit | Attending: Cardiology | Admitting: Cardiology

## 2021-05-13 DIAGNOSIS — I77819 Aortic ectasia, unspecified site: Secondary | ICD-10-CM | POA: Insufficient documentation

## 2021-05-13 DIAGNOSIS — I712 Thoracic aortic aneurysm, without rupture, unspecified: Secondary | ICD-10-CM | POA: Diagnosis present

## 2021-05-13 IMAGING — MR MR MRA CHEST W/ OR W/O CM
21 series · 21 of 21 positions shown · IV contrast (gadavist)
Comparison: [DATE]

CLINICAL DATA: Thoracic aortic aneurysm, previously measuring 45 mm

EXAM:
MRA CHEST WITH OR WITHOUT CONTRAST
TECHNIQUE: Angiographic images of the chest were obtained using MRA technique
without and with intravenous contrast.
CONTRAST:  8mL GADAVIST GADOBUTROL 1 MMOL/ML IV SOLN

[Series 2: t2_trufi_tra_p2_bh · axial · 8.0mm · 0.62mm/px · 1 of 22 slices shown]
[im 1/22]
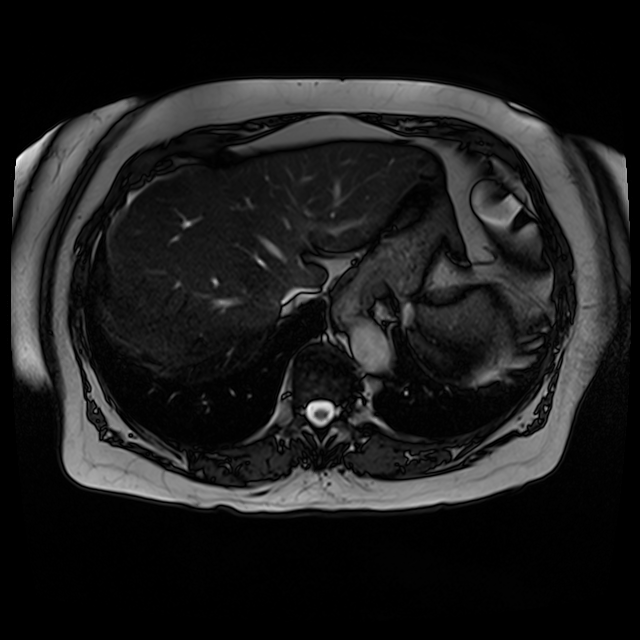

[Series 3: axial_db_haste_loc · axial · 7.0mm · 1.56mm/px · 1 of 24 slices shown]
[im 1/24]
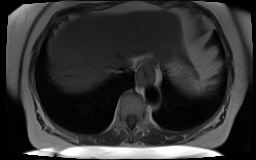

[Series 4: localizer_trufi_multislice · axial · 8.0mm · 1.56mm/px · 1 of 12 slices shown]
[im 1/12]
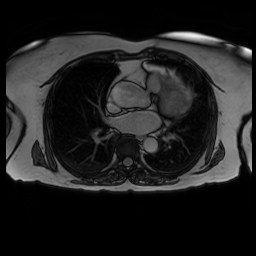

[Series 5: (person_name)_(person_name)_(person_name) · sagittal · 8.0mm · 1.79mm/px · 1 of 29 slices shown (1 of 9)]
[im 1/29]
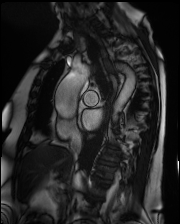

[Series 5: (person_name)_(person_name)_(person_name) · sagittal · 8.0mm · 1.79mm/px · 1 of 29 slices shown (2 of 9)]
[im 1/29]
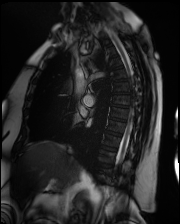

[Series 5: (person_name)_(person_name)_(person_name) · sagittal · 8.0mm · 1.79mm/px · 1 of 29 slices shown (3 of 9)]
[im 1/29]
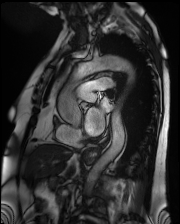

[Series 5: (person_name)_(person_name)_(person_name) · sagittal · 8.0mm · 1.79mm/px · 1 of 29 slices shown (4 of 9)]
[im 1/29]
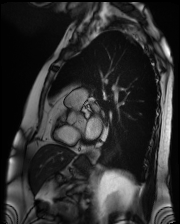

[Series 5: (person_name)_(person_name)_(person_name) · sagittal · 8.0mm · 1.79mm/px · 1 of 29 slices shown (5 of 9)]
[im 1/29]
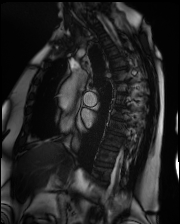

[Series 5: (person_name)_(person_name)_(person_name) · sagittal · 8.0mm · 1.79mm/px · 1 of 29 slices shown (6 of 9)]
[im 1/29]
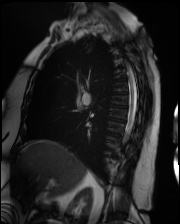

[Series 5: (person_name)_(person_name)_(person_name) · sagittal · 8.0mm · 1.79mm/px · 1 of 29 slices shown (7 of 9)]
[im 1/29]
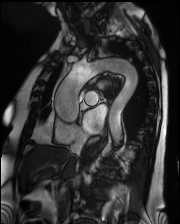

[Series 5: (person_name)_(person_name)_(person_name) · sagittal · 8.0mm · 1.79mm/px · 1 of 29 slices shown (8 of 9)]
[im 1/29]
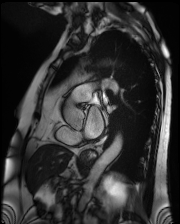

[Series 5: (person_name)_(person_name)_(person_name) · sagittal · 8.0mm · 1.79mm/px · 1 of 29 slices shown (9 of 9)]
[im 1/29]
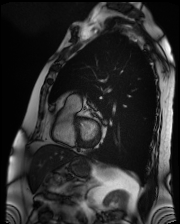

[Series 6: T1 dynamic · axial · non-contrast · 3.3mm · 1.18mm/px · 1 of 80 slices shown]
[im 1/80]
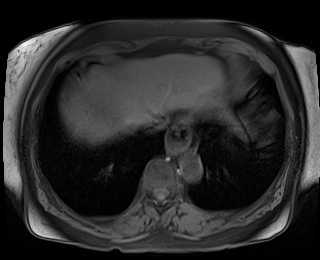

[Series 7: angio_fl3d_sag_pre · sagittal · 1.1mm · 1.17mm/px · 1 of 112 slices shown]
[im 1/112]
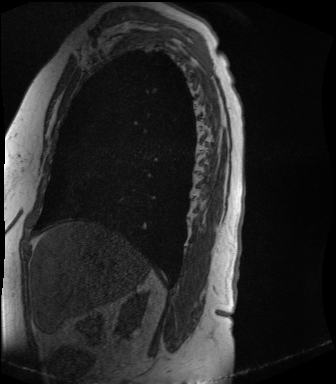

[Series 8: care_bolus_sag · sagittal · 20.0mm · 1.56mm/px · 1 of 30 slices shown]
[im 1/30]
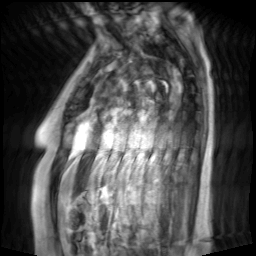

[Series 9: candy cane ce-arterial · sagittal · arterial · 1.1mm · 1.17mm/px · 1 of 112 slices shown]
[im 1/112]
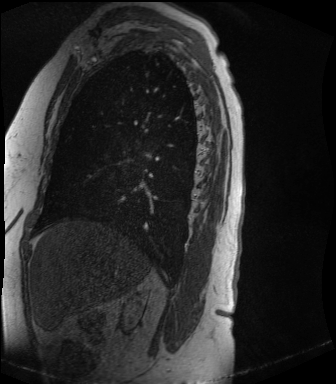

[Series 10: candy cane ce-arterial_sub · sagittal · 1.1mm · 1.17mm/px · 1 of 112 slices shown]
[im 1/112]
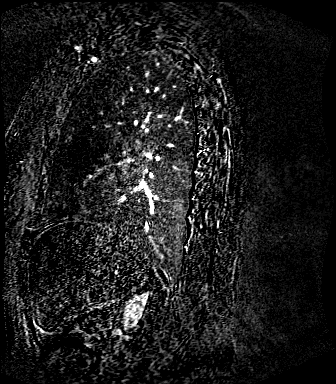

[Series 12: candy cane ce-venous · sagittal · portal-venous · 1.1mm · 1.17mm/px · 1 of 112 slices shown]
[im 1/112]
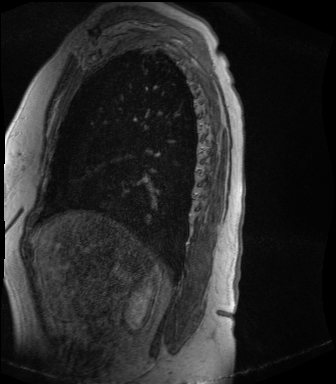

[Series 13: candy cane ce-venous_sub · sagittal · 1.1mm · 1.17mm/px · 1 of 112 slices shown]
[im 1/112]
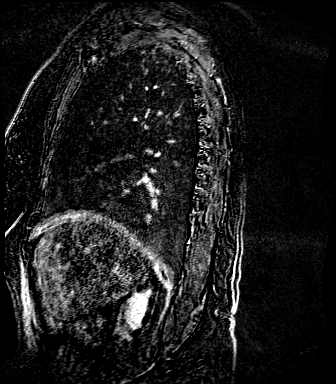

[Series 16: T1 dynamic post-contrast · axial · 3.3mm · 1.18mm/px · 1 of 80 slices shown]
[im 1/80]
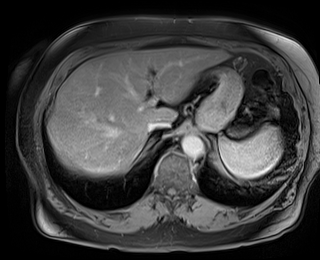

[Series 1019: mip rotate · sagittal · 1.1mm · 0.32mm/px · 1 of 3 slices shown]
[im 1/3]
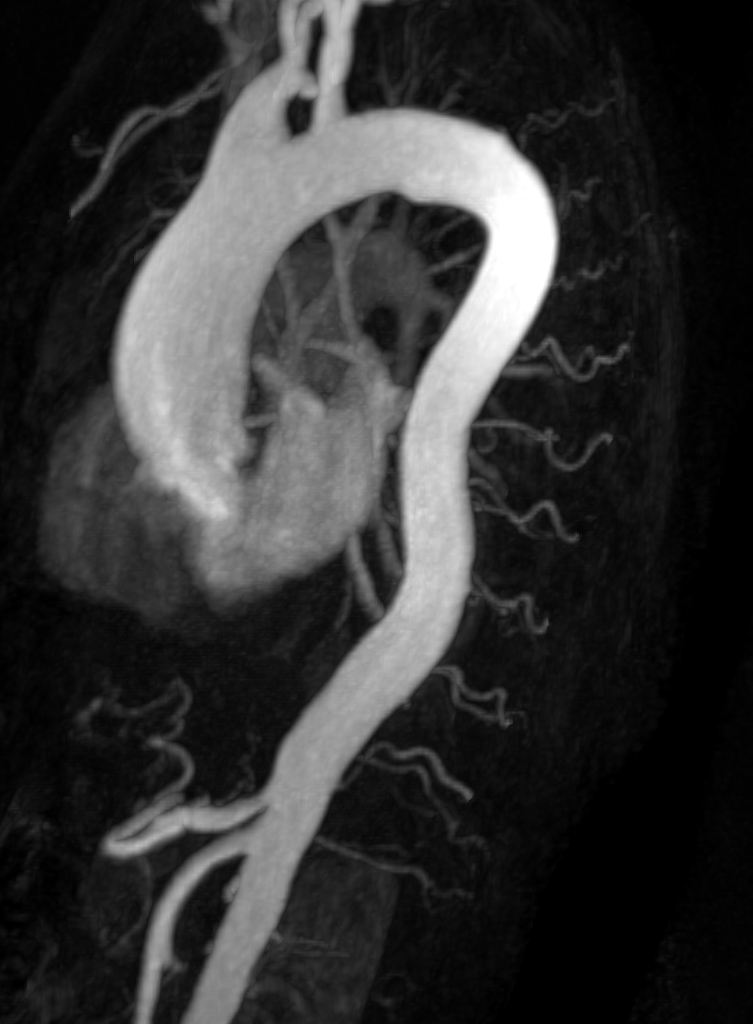

[21 of 21 positions shown; findings below may reference images not displayed]

FINDINGS: VASCULAR

Aorta: Stable mild fusiform aneurysmal dilatation of the ascending
thoracic aorta, maximal diameter 45 mm, unchanged. Remainder of the
aorta is normal in caliber. Patent 3 vessel arch anatomy. Aorta is
mildly elongated and tortuous. No dissection.

Heart: Normal heart size.  No pericardial effusion.

Pulmonary Arteries: Central pulmonary arteries are mildly prominent
in appear patent.

Other: Central venous structures are patent. No JIM
process.

NON-VASCULAR

Similar biapical pleural-parenchymal scarring.  No pleural effusion.

Right inferior thyroid nodule measures 2.4 cm.

No chest wall soft tissue abnormality or asymmetry. No bulky
adenopathy. Small hiatal hernia noted.
IMPRESSION: VASCULAR

Stable 45 mm mild aneurysm of the ascending thoracic aorta.

Ascending thoracic aortic aneurysm. Recommend semi-annual imaging
followup by CTA or MRA and referral to cardiothoracic surgery if not
already obtained. This recommendation follows [J3]
ACCF/AHA/AATS/ACR/ASA/SCA/JIM/JIM/JIM/JIM Guidelines for the
Diagnosis and Management of Patients With Thoracic Aortic Disease.
Circulation. [J3]; 121: [id number]. Aortic aneurysm NOS ([J3]-[J3])

No other acute intrathoracic vascular process. Negative for
dissection.

NON-VASCULAR

2.4 cm right inferior thyroid nodule.

Recommend thyroid US (ref: [HOSPITAL]. [DATE]):

## 2021-05-13 MED ORDER — GADOBUTROL 1 MMOL/ML IV SOLN
8.0000 mL | Freq: Once | INTRAVENOUS | Status: AC | PRN
  Administered 2021-05-13: 8 mL via INTRAVENOUS

## 2021-05-14 ENCOUNTER — Telehealth: Payer: Self-pay | Admitting: *Deleted

## 2021-05-14 ENCOUNTER — Telehealth: Payer: Self-pay | Admitting: Pharmacist

## 2021-05-14 DIAGNOSIS — I712 Thoracic aortic aneurysm, without rupture, unspecified: Secondary | ICD-10-CM

## 2021-05-14 DIAGNOSIS — Z0189 Encounter for other specified special examinations: Secondary | ICD-10-CM

## 2021-05-14 DIAGNOSIS — I77819 Aortic ectasia, unspecified site: Secondary | ICD-10-CM

## 2021-05-14 NOTE — Chronic Care Management (AMB) (Signed)
    Chronic Care Management Pharmacy Assistant   Name: Shanara Schnieders  MRN: [id number] DOB: Apr 25, 1951  05/14/21 APPOINTMENT REMINDER   Called Patient No answer, left message of appointment on 05/15/21 at 9 via telephone visit with [name], Pharm D.   Notified to have all medications, supplements, blood pressure and/or blood sugar logs available during appointment and to return call if need to reschedule.   Care Gaps: TDAP - Overdue Zoster vaccine - Overdue Pneumovax - Overdue COVID Booster #4 [employer]) - Overdue CPP follow up call  scheduled 12/22 Last AWV 09/29/20 - Office aware to schedule BP- 126/64   Star Rating Drug: Rosuvastatin 10 MG - Last filled 03/16/2021 90 [facility] Lisinopril- HCTZ 20/12.5 mg - Last filled 03/16/2021 90 [facility] at [facility]  Any gaps in medications fill history? None     Medications: Outpatient Encounter Medications as of 05/14/2021  Medication Sig   albuterol (PROVENTIL) (2.5 MG/3ML) 0.083% nebulizer solution USE 1 VIAL VIA NEBULIZER EVERY 6 HOURS AS NEEDED FOR WHEEZING OR SHORTNESS OF BREATH   albuterol (VENTOLIN HFA) 108 (90 Base) MCG/ACT inhaler Inhale 2 puffs into the lungs every 6 (six) hours as needed for wheezing or shortness of breath.   ALPRAZolam (XANAX) 0.25 MG tablet TAKE 1 OR 2 TABLETS BY MOUTH ONCE daily as needed   budesonide-formoterol (SYMBICORT) 160-4.5 MCG/ACT inhaler Inhale 2 puffs into the lungs 2 (two) times daily.   famotidine (PEPCID) 20 MG tablet Take 20 mg by mouth daily.   flecainide (TAMBOCOR) 50 MG tablet Take 1 tablet (50 mg total) by mouth 2 (two) times daily.   FLUoxetine (PROZAC) 20 MG capsule TAKE 1 CAPSULE(20 MG) BY MOUTH DAILY   fluticasone (FLONASE) 50 MCG/ACT nasal spray Place 2 sprays into both nostrils as needed.   levocetirizine (XYZAL) 5 MG tablet Take 5 mg by mouth every evening.   lisinopril-hydrochlorothiazide (ZESTORETIC) 20-12.5 MG tablet Take 2 tablets by mouth daily.   metoprolol  succinate (TOPROL-XL) 25 MG 24 hr tablet Take 1 tablet (25 mg total) by mouth daily. Take with or immediately following a meal.   montelukast (SINGULAIR) 10 MG tablet Take 1 tablet (10 mg total) by mouth at bedtime.   Multiple Vitamin (MULTIVITAMIN) tablet Take 1 tablet by mouth daily.   Multiple Vitamins-Minerals (VISION FORMULA PO) Take by mouth daily.    Probiotic Product (ALIGN) 4 MG CAPS Take by mouth daily.   rivaroxaban (XARELTO) 20 MG TABS tablet TAKE 1 TABLET(20 MG) BY MOUTH DAILY WITH SUPPER   rosuvastatin (CRESTOR) 10 MG tablet Take 1 tablet (10 mg total) by mouth daily.   triamcinolone cream (KENALOG) 0.1 % APPLY TOPICALLY TWICE DAILY   No facility-administered encounter medications on file as of 05/14/2021.    Patient Assistance: Symbicort Approved through 12/23  [place] Pharmacist Assistant [phone]

## 2021-05-14 NOTE — Telephone Encounter (Signed)
The patient has been notified of the result and verbalized understanding.  All questions (if any) were answered. Nuala Alpha, LPN 32/09/4008 2:72 PM   Pt aware that I will go ahead and place the order for a repeat MRA of Chest w/wo contrast in one year in the system, and have our [facility] Schedulers call her back and arrange this.   Pt verbalized understanding and agrees with this plan.

## 2021-05-14 NOTE — Telephone Encounter (Signed)
-----   Message from [name], MD sent at 05/14/2021  6:23 AM EST ----- Her aorta is stable in size at 25mm. We will continue annual imaging for monitoring.

## 2021-05-15 ENCOUNTER — Ambulatory Visit (INDEPENDENT_AMBULATORY_CARE_PROVIDER_SITE_OTHER): Payer: Medicare Other | Admitting: Pharmacist

## 2021-05-15 DIAGNOSIS — J4540 Moderate persistent asthma, uncomplicated: Secondary | ICD-10-CM

## 2021-05-15 DIAGNOSIS — I10 Essential (primary) hypertension: Secondary | ICD-10-CM

## 2021-05-15 NOTE — Progress Notes (Signed)
Chronic Care Management Pharmacy Note  05/18/2021 Name:  Glenda Bauer MRN:  [id number] DOB:  11-17-1950  Summary: BP is at goal < 140/90 per home readings Pt has appt with cardiology to discuss fatigue   Recommendations/Changes made from today's visit: -Recommended bringing BP cuff to next office visit -Recommended sleep study for daytime fatigue and snoring   Plan: Follow up BP assessment in 3 months  Subjective: Glenda Bauer is an 70 y.o. year old female who is a primary patient of [name], NP.  The CCM team was consulted for assistance with disease management and care coordination needs.    Engaged with patient by telephone for follow up visit in response to provider referral for pharmacy case management and/or care coordination services.   Consent to Services:  The patient was given information about Chronic Care Management services, agreed to services, and gave verbal consent prior to initiation of services.  Please see initial visit note for detailed documentation.   Patient Care Team: [name], NP as PCP - General (Family Medicine) [name], MD as PCP - Cardiology (Cardiology) Viona Gilmore, [facility] as Pharmacist (Pharmacist)  Recent office visits: 05/08/21 [name], NP - Patient presented for Routine general medical exam and other concerns. No medication changes.  Recent consult visits: 02/02/21 - Patient presented to [place] for Pelvic Ultrasound.  11/14/20 [name], MD (Cardiology) - Patient presented for Paroxysmal atrial fibrillation and other issues. Chest Xray ordered. Simvastatin 10 MG stopped and Rosuvastatin 10 MG once daily started.  10/01/20 Rolanda Lundborg MD (Gynecology) - presented to clinic for vulvar mass and cervical cancer screening. No medication changes. Return for follow up after ultrasound complete.   07/25/20 Malka So, PA (cardiology): Patient presented for Afib follow  up.   Hospital visits: None in previous 6 months  Objective:  Lab Results  Component Value Date   CREATININE 0.92 05/08/2021   BUN 20 05/08/2021   GFR 62.30 05/08/2021   GFRNONAA 59 (L) 05/21/2020   GFRAA 68 05/21/2020   NA 140 05/08/2021   K 4.0 05/08/2021   CALCIUM 10.1 05/08/2021   CO2 28 05/08/2021   GLUCOSE 99 05/08/2021    Lab Results  Component Value Date/Time   HGBA1C 5.6 05/08/2021 09:34 AM   GFR 62.30 05/08/2021 09:34 AM   GFR 66.11 02/06/2019 07:48 AM    Last diabetic Eye exam: No results found for: HMDIABEYEEXA  Last diabetic Foot exam: No results found for: HMDIABFOOTEX   Lab Results  Component Value Date   CHOL 145 05/08/2021   HDL 68.80 05/08/2021   LDLCALC 61 05/08/2021   TRIG 79.0 05/08/2021   CHOLHDL 2 05/08/2021    Hepatic Function Latest Ref Rng & Units 05/08/2021 03/27/2020 02/06/2019  Total Protein 6.0 - 8.3 g/dL 7.1 6.6 6.3  Albumin 3.5 - 5.2 g/dL 4.2 - 4.0  AST 0 - 37 U/L $Remo'21 18 23  'IwnSp$ ALT 0 - 35 U/L 29 20 36(H)  Alk Phosphatase 39 - 117 U/L 50 - 54  Total Bilirubin 0.2 - 1.2 mg/dL 0.8 0.7 0.8  Bilirubin, Direct 0.0 - 0.3 mg/dL - - -    Lab Results  Component Value Date/Time   TSH 0.86 05/08/2021 09:34 AM   TSH 1.26 03/27/2020 09:17 AM    CBC Latest Ref Rng & Units 05/08/2021 03/27/2020 02/06/2019  WBC 4.0 - 10.5 K/uL 5.1 5.3 4.3  Hemoglobin 12.0 - 15.0 g/dL 14.4 16.7(H) 14.8  Hematocrit 36.0 - 46.0 %  42.4 48.3(H) 42.9  Platelets 150.0 - 400.0 K/uL 229.0 243 215.0    No results found for: VD25OH  Clinical ASCVD: No  The 10-year ASCVD risk score (Arnett DK, et al., 2019) is: 11.4%   Values used to calculate the score:     Age: 35 years     Sex: Female     Is Non-Hispanic African American: No     Diabetic: No     Tobacco smoker: No     Systolic Blood Pressure: 299 mmHg     Is BP treated: Yes     HDL Cholesterol: 68.8 mg/dL     Total Cholesterol: 145 mg/dL    Depression screen [facility] 2/9 05/08/2021 09/29/2020 09/29/2020  Decreased  Interest 1 0 0  Down, Depressed, Hopeless 1 0 0  PHQ - 2 Score 2 0 0  Altered sleeping 1 - -  Tired, decreased energy 2 - -  Change in appetite 0 - -  Feeling bad or failure about yourself  0 - -  Trouble concentrating 0 - -  Moving slowly or fidgety/restless 0 - -  Suicidal thoughts 0 - -  PHQ-9 Score 5 - -     CHA2DS2/VAS Stroke Risk Points  Current as of just now     3 >= 2 Points: High Risk  1 - 1.99 Points: Medium Risk  0 Points: Low Risk    No Change      Details    This score determines the patient's risk of having a stroke if the  patient has atrial fibrillation.       Points Metrics  0 Has Congestive Heart Failure:  No    Current as of just now  0 Has Vascular Disease:  No    Current as of just now  1 Has Hypertension:  Yes    Current as of just now  1 Age:  28    Current as of just now  0 Has Diabetes:  No    Current as of just now  0 Had Stroke:  No  Had TIA:  No  Had Thromboembolism:  No    Current as of just now  1 Female:  Yes    Current as of just now     Social History   Tobacco Use  Smoking Status Former   Packs/day: 0.25   Years: 10.00   Pack years: 2.50   Types: Cigarettes  Smokeless Tobacco Never  Tobacco Comments   35 years ago; smoke socially   BP Readings from Last 3 Encounters:  05/08/21 114/70  01/08/21 121/78  11/14/20 126/64   Pulse Readings from Last 3 Encounters:  05/08/21 65  01/08/21 61  11/14/20 64   Wt Readings from Last 3 Encounters:  05/08/21 186 lb (84.4 kg)  01/08/21 182 lb (82.6 kg)  11/14/20 182 lb (82.6 kg)   BMI Readings from Last 3 Encounters:  05/08/21 30.02 kg/m  01/08/21 29.38 kg/m  11/14/20 29.38 kg/m    Assessment/Interventions: Review of patient past medical history, allergies, medications, health status, including review of consultants reports, laboratory and other test data, was performed as part of comprehensive evaluation and provision of chronic care management services.   SDOH:   (Social Determinants of Health) assessments and interventions performed: No   SDOH Screenings   Alcohol Screen: Not on file  Depression (PHQ2-9): Medium Risk   PHQ-2 Score: 5  Financial Resource Strain: Low Risk    Difficulty of Paying Living Expenses: Not hard  at all  Food Insecurity: No Food Insecurity   Worried About Charity fundraiser in the Last Year: Never true   Ran Out of Food in the Last Year: Never true  Housing: Low Risk    Last Housing Risk Score: 0  Physical Activity: Insufficiently Active   Days of Exercise per Week: 5 days   Minutes of Exercise per Session: 20 min  Social Connections: Moderately Isolated   Frequency of Communication with Friends and Family: More than three times a week   Frequency of Social Gatherings with Friends and Family: Twice a week   Attends Religious Services: Never   Marine scientist or Organizations: No   Attends Music therapist: 1 to 4 times per year   Marital Status: Divorced  Stress: Stress Concern Present   Feeling of Stress : Rather much  Tobacco Use: Medium Risk   Smoking Tobacco Use: Former   Smokeless Tobacco Use: Never   Passive Exposure: Not on Pensions consultant Needs: No Transportation Needs   Lack of Transportation (Medical): No   Lack of Transportation (Non-Medical): No    CCM Care Plan  No Known Allergies  Medications Reviewed Today     Reviewed by Viona Gilmore, [facility] (Pharmacist) on 05/18/21 at 1154  Med List Status: <None>   Medication Order Taking? Sig Documenting Provider Last Dose Status Informant  albuterol (PROVENTIL) (2.5 MG/3ML) 0.083% nebulizer solution [id number]  USE 1 VIAL VIA NEBULIZER EVERY 6 HOURS AS NEEDED FOR WHEEZING OR SHORTNESS OF BREATH Nafziger, Tommi Rumps, NP  Active   albuterol (VENTOLIN HFA) 108 (90 Base) MCG/ACT inhaler [id number] No Inhale 2 puffs into the lungs every 6 (six) hours as needed for wheezing or shortness of breath. Nafziger, Tommi Rumps, NP Taking Active    ALPRAZolam Duanne Moron) 0.25 MG tablet [id number]  TAKE 1 OR 2 TABLETS BY MOUTH ONCE daily as needed [name], NP  Active   budesonide-formoterol (SYMBICORT) 160-4.5 MCG/ACT inhaler [id number] No Inhale 2 puffs into the lungs 2 (two) times daily. Nafziger, Tommi Rumps, NP Taking Active   famotidine (PEPCID) 20 MG tablet [id number] No Take 20 mg by mouth daily. [provider] Taking Active   flecainide (TAMBOCOR) 50 MG tablet [id number] No Take 1 tablet (50 mg total) by mouth 2 (two) times daily. [name], MD Taking Active   FLUoxetine (PROZAC) 20 MG capsule [id number]  TAKE 1 CAPSULE(20 MG) BY MOUTH DAILY Nafziger, Tommi Rumps, NP  Active   fluticasone (FLONASE) 50 MCG/ACT nasal spray [id number] No Place 2 sprays into both nostrils as needed. [provider] Taking Active   levocetirizine (XYZAL) 5 MG tablet [id number] No Take 5 mg by mouth every evening. [provider] Taking Active   lisinopril-hydrochlorothiazide (ZESTORETIC) 20-12.5 MG tablet [id number] No Take 2 tablets by mouth daily. [name], MD Taking Active   metoprolol succinate (TOPROL-XL) 25 MG 24 hr tablet [id number] No Take 1 tablet (25 mg total) by mouth daily. Take with or immediately following a meal. Pemberton, Greer Ee, MD Taking Active   montelukast (SINGULAIR) 10 MG tablet [id number] No Take 1 tablet (10 mg total) by mouth at bedtime. Nafziger, Tommi Rumps, NP Taking Active   Multiple Vitamin (MULTIVITAMIN) tablet [id number] No Take 1 tablet by mouth daily. [provider] Taking Active   Multiple Vitamins-Minerals (VISION FORMULA PO) [id number] No Take by mouth daily.  [provider] Taking Active   Probiotic Product (ALIGN) 4 MG CAPS [id number] No Take by mouth daily. [provider]  Taking Active   rivaroxaban (XARELTO) 20 MG TABS tablet [id number] No TAKE 1 TABLET(20 MG) BY MOUTH DAILY WITH SUPPER Nafziger, Tommi Rumps, NP Taking Active   rosuvastatin (CRESTOR) 10 MG tablet [id number]  No Take 1 tablet (10 mg total) by mouth daily. [name], MD Taking Active   triamcinolone cream (KENALOG) 0.1 % [id number] No APPLY TOPICALLY TWICE DAILY [name], NP Taking Active             Patient Active Problem List   Diagnosis Date Noted   Thoracic aortic aneurysm without rupture 01/08/2021   Sebaceous cyst 2022   Paroxysmal atrial fibrillation ([place]) 04/03/2020   Secondary hypercoagulable state ([place]) 04/03/2020   Thyroid nodule    Osteoarthritis    Cyst of knee joint 02/13/2016   Sinusitis 10/30/2015   Asthma with exacerbation 10/30/2015   Sciatica 03/06/2015   Abdominal pain, right upper quadrant 02/12/2015   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 He son is moving out to his girlfriends house.   Patient reports she just started the Symbicort a couple of weeks ago and hasn't noticed that her breathing is any worse or better. She is still having some breathing difficulties with walking and is currently walking a little over a mile but is breathing heavier.  Patient reports she is sleeping more and taking more naps than ever before. She wasn't sure if this was related to her medications or not. Her naps sometimes last as long as 2 hours and she does report snoring and has never had a sleep study done.  Conditions to be addressed/monitored:  Hypertension, Hyperlipidemia, Atrial Fibrillation, Asthma, Depression, Anxiety, Osteoarthritis and Allergic Rhinitis  Conditions addressed  this visit: Asthma, hypertension   Care Plan : CCM Pharmacy Care Plan  Updates made by Viona Gilmore, [place] since 05/18/2021 12:00 AM     Problem: Problem: Hypertension, Hyperlipidemia, Atrial Fibrillation, Asthma, Depression, Anxiety, Osteoarthritis and Allergic Rhinitis      Long-Range Goal: Patient-Specific Goal   Start Date: 10/15/2020  Expected End Date: 10/15/2021  Recent Progress: On track  Priority: High  Note:   Current Barriers:  Unable to independently afford treatment regimen  Pharmacist Clinical Goal(s):  Patient will achieve adherence to monitoring guidelines and medication adherence to achieve therapeutic efficacy through collaboration with PharmD and provider.   Interventions: 1:1 collaboration with [name], NP regarding development and update of comprehensive plan of care as evidenced by provider attestation and co-signature Inter-disciplinary care team collaboration (see longitudinal plan of care) Comprehensive medication review performed; medication list updated in electronic medical record  Hypertension (BP goal <140/90) -Controlled -Current treatment: Lisinopril - HCTZ 20-12.5 1.5 tablets daily (rx said 2 tablets daily) - every 2 hours going to the bathroom Metoprolol succinate 25 mg 1 tablet daily -Medications previously tried: none  -Current home readings: highest 120/80, lowest 80; 114/78 (twice a week); pulse: 63, 72, 65, upper 60s (arm cuff) -Current dietary habits: cut down on salt and looking at sodium content; eats out about 50% of the time -Current exercise habits: walking dogs -Denies hypotensive/hypertensive symptoms -Educated on Exercise goal of  150 minutes per week; Importance of home blood pressure monitoring; Proper BP monitoring technique; -Counseled to monitor BP at home weekly, document, and provide log at future appointments -Counseled on diet and exercise extensively Recommended to continue current medication  Hyperlipidemia:  (LDL goal < 100) -Controlled -Current treatment: Rosuvastatin 10 mg 1 tablet daily -Medications previously tried: none  -Current dietary patterns: eats out about 50% of the time; uses an air fryer -Current exercise habits: walking dogs -Educated on Cholesterol goals;  Benefits of statin for ASCVD risk reduction; Importance of limiting foods high in cholesterol; Exercise goal of 150 minutes per week; -Counseled on diet and exercise extensively Recommended to continue current medication  Atrial Fibrillation (Goal: prevent stroke and major bleeding) -Controlled -CHADSVASC: 3 -Current treatment: Rate/rhythm control: flecainide 50 mg twice daily, metoprolol succinate 50 mg 1 tablet daily Anticoagulation: Xarelto 20 mg 1 tablet with supper  -Medications previously tried: none -Home BP and HR readings: provided above  -Counseled on increased risk of stroke due to Afib and benefits of anticoagulation for stroke prevention; importance of adherence to anticoagulant exactly as prescribed; avoidance of NSAIDs due to increased bleeding risk with anticoagulants; -Counseled on diet and exercise extensively Recommended to continue current medication  Asthma (Goal: control symptoms) -Controlled -Current treatment  Symbicort inhale 2 puffs twice daily Albuterol nebulizer as needed Proair respiclick 2 puffs as needed Montelukast 10 mg 1 tablet daily -Medications previously tried: [employer] (cost), Advair -Exacerbations requiring treatment in last 6 months: none -Patient reports consistent use of maintenance inhaler -Frequency of rescue inhaler use: once a week (during walks) -Counseled on Proper inhaler technique; Benefits of consistent maintenance inhaler use When to use rescue inhaler Differences between maintenance and rescue inhalers -Recommended to continue current medication  Depression/Anxiety (Goal: minimize symptoms) -Controlled -Current treatment: Alprazolam 0.25 mg 1-2  tablets daily as needed  Fluoxetine 20 mg 1 capsule daily -Medications previously tried/failed: citalopram (ineffective, fatigue) -PHQ9: 3 -GAD7: n/a -Connected with [place] for mental health support -Educated on Benefits of medication for symptom control Benefits of cognitive-behavioral therapy with or without medication -Recommended to continue current medication  Allergic rhinitis (Goal: minimize symptoms) -Controlled -Current treatment  Levocetirizine 5 mg 1/2 tablet every evening (twice a week) Fluticasone 50 mcg/act 2 sprays daily as needed -Medications previously tried: none  -Recommended trial of Flonase sensimist if regular Flonase is causing nosebleeds   GERD (Goal: minimize symptoms) -Controlled -Current treatment  Famotidine 20 mg 1 tablet daily -Medications previously tried: none  -Counseled on non-pharmacologic management of symptoms such as elevating the head of your bed, avoiding eating 2-3 hours before bed, avoiding triggering foods such as acidic, spicy, or fatty foods, eating smaller meals, and wearing clothes that are loose around the waist  Health Maintenance -Vaccine gaps: shingles, tetanus, pneumonia ([facility]) -Current therapy:  Multivitamin 1 tablet daily (gummy) Vision vitamin daily  Align probiotic daily  Triamcinolone 0.1% cream twice daily as needed -Educated on Cost vs benefit of each product must be carefully weighed by individual consumer -Patient is satisfied with current therapy and denies issues -Recommended to continue current medication  Patient Goals/Self-Care Activities Patient will:  - take medications as prescribed check blood pressure weekly, document, and provide at future appointments target a minimum of 150 minutes of moderate intensity exercise weekly  Follow Up Plan: Telephone follow up appointment with care management team member scheduled for: 6 months       Medication Assistance:  Symbicort [place]  through [place].  Enrollment ends 05/2022  Compliance/Adherence/Medication fill history: Care Gaps: Pneumovax, shingrix, tetanus BP- 126/64   Star-Rating Drugs: Rosuvastatin 10 MG - Last filled 03/16/2021 90 DS at [facility] Lisinopril- HCTZ 20/12.5 mg - Last filled 03/16/2021 90 DS at [facility]  Patient's preferred pharmacy is:  [facility] - [place], [place] - [address] [place] [address] Phone: [phone] Fax: [phone]  Uses pill box? Yes - weekly  Pt endorses 100% compliance - wasn't sure if she took it  We discussed: Benefits of medication synchronization, packaging and delivery as well as enhanced pharmacist oversight with [facility]. Patient decided to: Utilize [facility] pharmacy for medication synchronization, packaging and delivery  Care Plan and Follow Up Patient Decision:  Patient agrees to Care Plan and Follow-up.  Plan: Telephone follow up appointment with care management team member scheduled for:  6 months  [name], PharmD [place] Pharmacist [place] at [place] [phone]

## 2021-05-18 NOTE — Patient Instructions (Signed)
Hi Glenda Bauer,  It was great to catch up with you again!  Please reach out to me if you have any questions or need anything before our follow up!  Best, Glenda Bauer  [name], PharmD, [facility]   Visit Information   Goals Addressed   None    Patient Care Plan: CCM Pharmacy Care Plan     Problem Identified: Problem: Hypertension, Hyperlipidemia, Atrial Fibrillation, Asthma, Depression, Anxiety, Osteoarthritis and Allergic Rhinitis      Long-Range Goal: Patient-Specific Goal   Start Date: 10/15/2020  Expected End Date: 10/15/2021  Recent Progress: On track  Priority: High  Note:   Current Barriers:  Unable to independently afford treatment regimen  Pharmacist Clinical Goal(s):  Patient will achieve adherence to monitoring guidelines and medication adherence to achieve therapeutic efficacy through collaboration with PharmD and provider.   Interventions: 1:1 collaboration with [name], NP regarding development and update of comprehensive plan of care as evidenced by provider attestation and co-signature Inter-disciplinary care team collaboration (see longitudinal plan of care) Comprehensive medication review performed; medication list updated in electronic medical record  Hypertension (BP goal <140/90) -Controlled -Current treatment: Lisinopril - HCTZ 20-12.5 1.5 tablets daily (rx said 2 tablets daily) - every 2 hours going to the bathroom Metoprolol succinate 25 mg 1 tablet daily -Medications previously tried: none  -Current home readings: highest 120/80, lowest 80; 114/78 (twice a week); pulse: 63, 72, 65, upper 60s (arm cuff) -Current dietary habits: cut down on salt and looking at sodium content; eats out about 50% of the time -Current exercise habits: walking dogs -Denies hypotensive/hypertensive symptoms -Educated on Exercise goal of 150 minutes per week; Importance of home blood pressure  monitoring; Proper BP monitoring technique; -Counseled to monitor BP at home weekly, document, and provide log at future appointments -Counseled on diet and exercise extensively Recommended to continue current medication  Hyperlipidemia: (LDL goal < 100) -Controlled -Current treatment: Rosuvastatin 10 mg 1 tablet daily -Medications previously tried: none  -Current dietary patterns: eats out about 50% of the time; uses an air fryer -Current exercise habits: walking dogs -Educated on Cholesterol goals;  Benefits of statin for ASCVD risk reduction; Importance of limiting foods high in cholesterol; Exercise goal of 150 minutes per week; -Counseled on diet and exercise extensively Recommended to continue current medication  Atrial Fibrillation (Goal: prevent stroke and major bleeding) -Controlled -CHADSVASC: 3 -Current treatment: Rate/rhythm control: flecainide 50 mg twice daily, metoprolol succinate 50 mg 1 tablet daily Anticoagulation: Xarelto 20 mg 1 tablet with supper  -Medications previously tried: none -Home BP and HR readings: provided above  -Counseled on increased risk of stroke due to Afib and benefits of anticoagulation for stroke prevention; importance of adherence to anticoagulant exactly as prescribed; avoidance of NSAIDs due to increased bleeding risk with anticoagulants; -Counseled on diet and exercise extensively Recommended to continue current medication  Asthma (Goal: control symptoms) -Controlled -Current treatment  Symbicort inhale 2 puffs twice daily Albuterol nebulizer as needed Proair respiclick 2 puffs as needed Montelukast 10 mg 1 tablet daily -Medications previously tried: [employer] (cost), Advair -Exacerbations requiring treatment in last 6 months: none -Patient reports consistent use of maintenance inhaler -Frequency of rescue inhaler use: once a week (during walks) -Counseled on Proper inhaler technique; Benefits of consistent maintenance  inhaler use When to use rescue inhaler Differences between maintenance and rescue inhalers -Recommended to continue current medication  Depression/Anxiety (Goal: minimize symptoms) -Controlled -Current treatment: Alprazolam 0.25 mg 1-2 tablets daily  as needed  Fluoxetine 20 mg 1 capsule daily -Medications previously tried/failed: citalopram (ineffective, fatigue) -PHQ9: 3 -GAD7: n/a -Connected with [place] for mental health support -Educated on Benefits of medication for symptom control Benefits of cognitive-behavioral therapy with or without medication -Recommended to continue current medication  Allergic rhinitis (Goal: minimize symptoms) -Controlled -Current treatment  Levocetirizine 5 mg 1/2 tablet every evening (twice a week) Fluticasone 50 mcg/act 2 sprays daily as needed -Medications previously tried: none  -Recommended trial of Flonase sensimist if regular Flonase is causing nosebleeds   GERD (Goal: minimize symptoms) -Controlled -Current treatment  Famotidine 20 mg 1 tablet daily -Medications previously tried: none  -Counseled on non-pharmacologic management of symptoms such as elevating the head of your bed, avoiding eating 2-3 hours before bed, avoiding triggering foods such as acidic, spicy, or fatty foods, eating smaller meals, and wearing clothes that are loose around the waist  Health Maintenance -Vaccine gaps: shingles, tetanus, pneumonia ([facility]) -Current therapy:  Multivitamin 1 tablet daily (gummy) Vision vitamin daily  Align probiotic daily  Triamcinolone 0.1% cream twice daily as needed -Educated on Cost vs benefit of each product must be carefully weighed by individual consumer -Patient is satisfied with current therapy and denies issues -Recommended to continue current medication  Patient Goals/Self-Care Activities Patient will:  - take medications as prescribed check blood pressure weekly, document, and provide at future  appointments target a minimum of 150 minutes of moderate intensity exercise weekly  Follow Up Plan: Telephone follow up appointment with care management team member scheduled for: 6 months       Patient verbalizes understanding of instructions provided today and agrees to view in [place].  Telephone follow up appointment with pharmacy team member scheduled for: 6 months  Viona Gilmore, [facility]

## 2021-05-20 ENCOUNTER — Other Ambulatory Visit: Payer: Self-pay | Admitting: Adult Health

## 2021-05-20 ENCOUNTER — Other Ambulatory Visit: Payer: Self-pay

## 2021-05-20 ENCOUNTER — Ambulatory Visit: Payer: Medicare Other | Admitting: Surgery

## 2021-05-20 ENCOUNTER — Encounter: Payer: Self-pay | Admitting: Surgery

## 2021-05-20 VITALS — BP 128/78 | HR 62 | Resp 20 | Wt 181.0 lb

## 2021-05-20 DIAGNOSIS — R5383 Other fatigue: Secondary | ICD-10-CM

## 2021-05-20 DIAGNOSIS — I7121 Aneurysm of the ascending aorta, without rupture: Secondary | ICD-10-CM | POA: Diagnosis not present

## 2021-05-23 NOTE — Progress Notes (Signed)
HPI:  The patient is a 70 year old woman who returns for follow-up of a 4.5 cm fusiform ascending aortic aneurysm.  She was seen for initial consultation in our office by one of our PAs on 01/08/2021.  She has a history of atrial fibrillation, hypertension, hyperlipidemia, previous smoking, asthma, anxiety and depression.  She is followed by Dr. Johney Frame for her cardiology care.  She has been feeling well without chest pain or shortness of breath.  Current Outpatient Medications  Medication Sig Dispense Refill   albuterol (PROVENTIL) (2.5 MG/3ML) 0.083% nebulizer solution USE 1 VIAL VIA NEBULIZER EVERY 6 HOURS AS NEEDED FOR WHEEZING OR SHORTNESS OF BREATH 225 mL 0   albuterol (VENTOLIN HFA) 108 (90 Base) MCG/ACT inhaler Inhale 2 puffs into the lungs every 6 (six) hours as needed for wheezing or shortness of breath. 8 g 2   ALPRAZolam (XANAX) 0.25 MG tablet TAKE 1 OR 2 TABLETS BY MOUTH ONCE daily as needed 30 tablet 0   budesonide-formoterol (SYMBICORT) 160-4.5 MCG/ACT inhaler Inhale 2 puffs into the lungs 2 (two) times daily. 1 each 11   famotidine (PEPCID) 20 MG tablet Take 20 mg by mouth daily.     flecainide (TAMBOCOR) 50 MG tablet Take 1 tablet (50 mg total) by mouth 2 (two) times daily. 60 tablet 5   FLUoxetine (PROZAC) 20 MG capsule TAKE 1 CAPSULE(20 MG) BY MOUTH DAILY 90 capsule 1   fluticasone (FLONASE) 50 MCG/ACT nasal spray Place 2 sprays into both nostrils as needed.     levocetirizine (XYZAL) 5 MG tablet Take 5 mg by mouth every evening.     lisinopril-hydrochlorothiazide (ZESTORETIC) 20-12.5 MG tablet Take 2 tablets by mouth daily. 180 tablet 2   metoprolol succinate (TOPROL-XL) 25 MG 24 hr tablet Take 1 tablet (25 mg total) by mouth daily. Take with or immediately following a meal. 90 tablet 3   montelukast (SINGULAIR) 10 MG tablet Take 1 tablet (10 mg total) by mouth at bedtime. 90 tablet 3   Multiple Vitamin (MULTIVITAMIN) tablet Take 1 tablet by mouth daily.     Multiple  Vitamins-Minerals (VISION FORMULA PO) Take by mouth daily.      Probiotic Product (ALIGN) 4 MG CAPS Take by mouth daily.     rivaroxaban (XARELTO) 20 MG TABS tablet TAKE 1 TABLET(20 MG) BY MOUTH DAILY WITH SUPPER 90 tablet 3   rosuvastatin (CRESTOR) 10 MG tablet Take 1 tablet (10 mg total) by mouth daily. 90 tablet 2   triamcinolone cream (KENALOG) 0.1 % APPLY TOPICALLY TWICE DAILY 454 g 0   No current facility-administered medications for this visit.     Physical Exam: BP 128/78 (BP Location: Left Arm, Patient Position: Sitting)    Pulse 62    Resp 20    Wt 181 lb (82.1 kg)    LMP 06/07/2000 (Approximate)    SpO2 93%    BMI 29.21 kg/m  She looks well. Cardiac exam shows regular rate and rhythm with normal heart sounds. Lung exam is clear.  Diagnostic Tests:  CLINICAL DATA:  Thoracic aortic aneurysm, previously measuring 45 mm   EXAM: MRA CHEST WITH OR WITHOUT CONTRAST   TECHNIQUE: Angiographic images of the chest were obtained using MRA technique without and with intravenous contrast.   CONTRAST:  35mL GADAVIST GADOBUTROL 1 MMOL/ML IV SOLN   COMPARISON:  11/12/2020   FINDINGS: VASCULAR   Aorta: Stable mild fusiform aneurysmal dilatation of the ascending thoracic aorta, maximal diameter 45 mm, unchanged. Remainder of the aorta is normal  in caliber. Patent 3 vessel arch anatomy. Aorta is mildly elongated and tortuous. No dissection.   Heart: Normal heart size.  No pericardial effusion.   Pulmonary Arteries: Central pulmonary arteries are mildly prominent in appear patent.   Other: Central venous structures are patent. No veno-occlusive process.   NON-VASCULAR   Similar biapical pleural-parenchymal scarring.  No pleural effusion.   Right inferior thyroid nodule measures 2.4 cm.   No chest wall soft tissue abnormality or asymmetry. No bulky adenopathy. Small hiatal hernia noted.   IMPRESSION: VASCULAR   Stable 45 mm mild aneurysm of the ascending thoracic  aorta.   Ascending thoracic aortic aneurysm. Recommend semi-annual imaging followup by CTA or MRA and referral to cardiothoracic surgery if not already obtained. This recommendation follows 2010 ACCF/AHA/AATS/ACR/ASA/SCA/SCAI/SIR/STS/SVM Guidelines for the Diagnosis and Management of Patients With Thoracic Aortic Disease. Circulation. 2010; 121: T614-E315. Aortic aneurysm NOS (ICD10-I71.9)   No other acute intrathoracic vascular process. Negative for dissection.   NON-VASCULAR   2.4 cm right inferior thyroid nodule.   Recommend thyroid US (ref: J Am Coll Radiol. 2015 Feb;12(2): 143-50).     Electronically Signed   By: Jerilynn Mages.  Shick M.D.   On: 05/13/2021 12:01    Impression:  This 70 year old woman has a stable 4.5 cm fusiform ascending aortic aneurysm.  Previous echo in November 2021 showed a trileaflet aortic valve with mild to moderate aortic insufficiency with pressure half-time of 576 ms.  There is no stenosis.  Her aneurysm is well below the surgical threshold of 5.5 cm.  I reviewed the MRA images with her and answered all of her questions.  I stressed the importance of continued good blood pressure control in preventing further enlargement and acute aortic dissection.  Plan:  I will plan to see her back in 1 year with an MRA of the chest to follow-up on her aneurysm.  Her aortic insufficiency will be followed by echocardiogram which can be ordered by Dr. Johney Frame.   I spent 20 minutes performing this established patient evaluation and > 50% of this time was spent face to face counseling and coordinating the care of this patient's aortic aneurysm.    Gaye Pollack, MD [facility] [phone]

## 2021-05-24 NOTE — Progress Notes (Signed)
Cardiology Office Note:    Date:  05/25/2021   ID:  Glenda Bauer, DOB 03-15-1951, MRN [id number]  PCP:  [name], NP  [facility] Cardiologist:  [name], MD  [facility] Electrophysiologist:  None   Referring MD: [name], NP    History of Present Illness:    Glenda Bauer is a 70 y.o. female with a hx of HTN, cutaneous T-cell lymphoma, ascending aortic aneurysm, and atrial fibrillation managed by Afib clinic who presents to clinic for CV follow-up.  The patient was initially diagnosed with atrial fibrillation 03/27/20 at her routine PCP visit. ECG showed afib HR 137. Patient was started on Xarelto for a CHADS2VASC score of 3 and metoprolol for rate control.   Has been followed by Afib clinic and was initiated on flecainide with return to NSR.   During visit on 05/13/20 we obtained a MRA of her aorta due to dilation noted on TTE which demonstrated ascending aorta 46mm.   Last seen 11/14/20 where she was doing well. Has since seen Dr. Cyndia Bent on 05/20/21 where her ascending aortic aneurysm was stable in size. Recommended for yearly follow-up.  Today, the patient states that she is feeling more fatigued and SOB with exertion. Able to walk a mile and then needs to stop, which is new for her. Admits she has not been as active since she had COVID in Spring but has noticed that her fatigue is getting worse despite stopping working at one of her jobs. No chest pressure, lightheadedness, dizziness or syncope. No recent episodes of Afib and no palpitations. Occasional nose bleeds on the xarelto.   Past Medical History:  Diagnosis Date   Allergy    Anxiety    Asthma    Atrial fibrillation ([place])    Cancer (HCC)    Melanoma and Mycosis Fungosis   Cataract    Cutaneous T-cell lymphoma ([place]) 2011   Dermatologist treated in [place]; Dr. Hipolito Bayley   Depression    GERD (gastroesophageal reflux disease)    Heart murmur    Hypertension     Neuromuscular disorder (HCC)    Osteoarthritis    Sebaceous cyst 2022   right vulva   Thyroid nodule    Urinary tract infection     Past Surgical History:  Procedure Laterality Date   Cataracts Bilateral    CHOLECYSTECTOMY  2015   melenoma removal  1979   Sinus Polyps       Current Medications: Current Meds  Medication Sig   albuterol (PROVENTIL) (2.5 MG/3ML) 0.083% nebulizer solution USE 1 VIAL VIA NEBULIZER EVERY 6 HOURS AS NEEDED FOR WHEEZING OR SHORTNESS OF BREATH   albuterol (VENTOLIN HFA) 108 (90 Base) MCG/ACT inhaler Inhale 2 puffs into the lungs every 6 (six) hours as needed for wheezing or shortness of breath.   ALPRAZolam (XANAX) 0.25 MG tablet TAKE 1 OR 2 TABLETS BY MOUTH ONCE daily as needed   budesonide-formoterol (SYMBICORT) 160-4.5 MCG/ACT inhaler Inhale 2 puffs into the lungs 2 (two) times daily.   famotidine (PEPCID) 20 MG tablet Take 20 mg by mouth daily.   flecainide (TAMBOCOR) 50 MG tablet Take 1 tablet (50 mg total) by mouth 2 (two) times daily.   FLUoxetine (PROZAC) 20 MG capsule TAKE 1 CAPSULE(20 MG) BY MOUTH DAILY   fluticasone (FLONASE) 50 MCG/ACT nasal spray Place 2 sprays into both nostrils as needed.   levocetirizine (XYZAL) 5 MG tablet Take 5 mg by mouth as needed for allergies.   lisinopril-hydrochlorothiazide (ZESTORETIC) 20-12.5 MG  tablet Take 2 tablets by mouth daily.   metoprolol succinate (TOPROL-XL) 25 MG 24 hr tablet Take 1 tablet (25 mg total) by mouth daily. Take with or immediately following a meal.   montelukast (SINGULAIR) 10 MG tablet Take 1 tablet (10 mg total) by mouth at bedtime.   Multiple Vitamin (MULTIVITAMIN) tablet Take 1 tablet by mouth daily.   Multiple Vitamins-Minerals (VISION FORMULA PO) Take by mouth daily.    Probiotic Product (ALIGN) 4 MG CAPS Take by mouth daily.   rivaroxaban (XARELTO) 20 MG TABS tablet TAKE 1 TABLET(20 MG) BY MOUTH DAILY WITH SUPPER   rosuvastatin (CRESTOR) 10 MG tablet Take 1 tablet (10 mg total) by  mouth daily.   triamcinolone cream (KENALOG) 0.1 % APPLY TOPICALLY TWICE DAILY     Allergies:   Patient has no known allergies.   Social History   Socioeconomic History   Marital status: Divorced    Spouse name: Not on file   Number of children: 3   Years of education: 16   Highest education level: Associate degree: academic program  Occupational History   Occupation: Medical sales representative   Tobacco Use   Smoking status: Former    Packs/day: 0.25    Years: 10.00    Pack years: 2.50    Types: Cigarettes   Smokeless tobacco: Never   Tobacco comments:    35 years ago; smoke socially  Scientific laboratory technician Use: Never used  Substance and Sexual Activity   Alcohol use: No    Alcohol/week: 0.0 standard drinks   Drug use: No   Sexual activity: Not Currently    Birth control/protection: Post-menopausal  Other Topics Concern   Not on file  Social History Narrative   Separated    Three children ( one lives locally)    Social Determinants of Health   Financial Resource Strain: Low Risk    Difficulty of Paying Living Expenses: Not hard at all  Food Insecurity: No Food Insecurity   Worried About Charity fundraiser in the Last Year: Never true   Arboriculturist in the Last Year: Never true  Transportation Needs: No Transportation Needs   Lack of Transportation (Medical): No   Lack of Transportation (Non-Medical): No  Physical Activity: Insufficiently Active   Days of Exercise per Week: 5 days   Minutes of Exercise per Session: 20 min  Stress: Stress Concern Present   Feeling of Stress : Rather much  Social Connections: Moderately Isolated   Frequency of Communication with Friends and Family: More than three times a week   Frequency of Social Gatherings with Friends and Family: Twice a week   Attends Religious Services: Never   Marine scientist or Organizations: No   Attends Music therapist: 1 to 4 times per year   Marital Status: Divorced     Family History: The  patient's family history includes Arthritis in her mother; Breast cancer (age of onset: 70) in her mother; Crohn's disease in her brother; Ovarian cancer in her mother; Parkinson's disease in her father; Schizophrenia in her brother; Thyroid disease in her mother. There is no history of Colon cancer, Esophageal cancer, Rectal cancer, or Stomach cancer.  ROS:   Please see the history of present illness.    Review of Systems  Constitutional:  Positive for malaise/fatigue. Negative for chills and fever.  HENT:  Positive for nosebleeds. Negative for sinus pain.   Eyes:  Negative for blurred vision.  Respiratory:  Positive  for shortness of breath. Negative for sputum production.   Cardiovascular:  Negative for chest pain, palpitations, orthopnea, claudication, leg swelling and PND.  Gastrointestinal:  Negative for blood in stool and melena.  Genitourinary:  Negative for hematuria.  Musculoskeletal:  Positive for myalgias.  Neurological:  Negative for dizziness and loss of consciousness.  Endo/Heme/Allergies:  Bruises/bleeds easily.   EKGs/Labs/Other Studies Reviewed:    The following studies were reviewed today: MRA of aorta 11/13/20: FINDINGS: VASCULAR   Aorta: Tortuous thoracic aorta with type 3 arch. No pedunculated plaque. No periaortic fluid. No dissection.   The estimated diameter of the ascending aorta is 45 mm. No significant atherosclerotic changes.   Heart: Heart size within normal limits.  No pericardial fluid.   Pulmonary Arteries: Flow signal of the pulmonary arteries unremarkable. Main pulmonary artery measures 32 mm.   NON-VASCULAR   Pleura/lungs: No pleural fluid.   Nodular changes at the bilateral lung apices, pleuroparenchymal interface, poorly characterized on MR.   No confluent airspace disease.   Mediastinum: No adenopathy. Unremarkable thoracic inlet. Hiatal hernia.   Musculoskeletal: Relatively uniform signal within the visualized spinal vertebral  bodies. No narrowing of the spinal canal.   Unremarkable appearance of the chest wall with no adenopathy.   Upper abdomen: Unremarkable   IMPRESSION: Estimated maximum diameter of the ascending aorta 45 mm. Recommend semi-annual imaging followup by CTA or MRA and referral to cardiothoracic surgery if not already obtained. This recommendation follows 2010 ACCF/AHA/AATS/ACR/ASA/SCA/SCAI/SIR/STS/SVM Guidelines for the Diagnosis and Management of Patients With Thoracic Aortic Disease. Circulation. 2010; 121: X323-F573. Aortic aneurysm NOS (ICD10-I71.9)   Lung nodularity at the bilateral lung apices, poorly characterized by MR. If MR surveillance of the aorta is elected, suggest at least chest x-ray documentation of what is presumably benign scarring at the lung apices. Otherwise, this could be characterized on surveillance CT angiogram.  TTE 04/08/20: IMPRESSIONS   1. Left ventricular ejection fraction, by estimation, is 50 to 55%. The  left ventricle has low normal function. The left ventricle has no regional  wall motion abnormalities. Left ventricular diastolic parameters are  indeterminate.   2. Right ventricular systolic function is normal. The right ventricular  size is normal. There is normal pulmonary artery systolic pressure.   3. The mitral valve is normal in structure. Mild to moderate mitral valve  regurgitation. No evidence of mitral stenosis.   4. The aortic valve is normal in structure. Aortic valve regurgitation is  mild to moderate. No aortic stenosis is present.   5. Aortic dilatation noted. There is mild to moderate dilatation of the  ascending aorta, measuring 44 mm.  EKG:  EKG is  ordered today.  The ekg ordered today demonstrates sinus bradycardia with HR 55. Normal intervals. Normal QRS.  Recent Labs: 05/08/2021: ALT 29; BUN 20; Creatinine, Ser 0.92; Hemoglobin 14.4; Platelets 229.0; Potassium 4.0; Sodium 140; TSH 0.86  Recent Lipid Panel    Component Value  Date/Time   CHOL 145 05/08/2021 0934   CHOL 122 01/09/2021 0911   TRIG 79.0 05/08/2021 0934   HDL 68.80 05/08/2021 0934   HDL 65 01/09/2021 0911   CHOLHDL 2 05/08/2021 0934   VLDL 15.8 05/08/2021 0934   LDLCALC 61 05/08/2021 0934   LDLCALC 45 01/09/2021 0911   LDLCALC 110 (H) 03/27/2020 0917     Risk Assessment/Calculations:    CHA2DS2-VASc Score =    This indicates a  % annual risk of stroke. The patient's score is based upon:      Physical  Exam:    VS:  BP 118/70    Pulse (!) 55    Ht 5\' 6"  (1.676 m)    Wt 187 lb (84.8 kg)    LMP 06/07/2000 (Approximate)    SpO2 97%    BMI 30.18 kg/m     Wt Readings from Last 3 Encounters:  05/25/21 187 lb (84.8 kg)  05/20/21 181 lb (82.1 kg)  05/08/21 186 lb (84.4 kg)     GEN:  Comfortable HEENT: Normal NECK: No JVD; No carotid bruits CARDIAC: RRR, 2/6 systolic murmur. No rubs or gallops RESPIRATORY:  CTAB, no wheezes ABDOMEN: Soft, non-tender, non-distended MUSCULOSKELETAL:  No edema; No deformity  SKIN: Warm and dry NEUROLOGIC:  Alert and oriented x 3 PSYCHIATRIC:  Normal affect   ASSESSMENT:    1. Dyspnea on exertion   2. Dilation of aorta (HCC)   3. Thoracic aortic aneurysm without rupture, unspecified part   4. Paroxysmal atrial fibrillation (HCC)   5. Essential hypertension   6. Hyperlipidemia, unspecified hyperlipidemia type   7. Unstable angina pectoris (HCC)    PLAN:    In order of problems listed above:  #Progressive Fatigue with Exertion: #DOE: Has been ongoing for several months and has progressively worsened. While may be related to deconditioning due to less activity after COVID, given progressive and exertional nature as well as risk factors, will check coronary CTA to assess for symptomatic CAD.  -Check coronary CTA  #Paroxysmal Afib: CHADs-vasc 3. Now back in rhythm since starting flecainide. On xarelto for [facility] which she is tolerating well. Followed by Afib clinic. -Continue flecainide 50mg   BID -Continue xarelto for [facility] -Continue metop 25mg  XL -Follow-up with Afib clinic as scheduled  #Mild-to-moderate ascending aortic dilation Measures 6mm on TTE and 66mm on MRA chest 11/12/20. -Followed by Dr. Cyndia Bent -Aggressive blood pressure control -Avoid heavy lifting >30lbs; discussed with the patient -Okay for weight bearing strength exercises like pilates  #Mild-to-moderate MR: -Serial TTEs every 2-3 years (next 2023)  #HTN Well controlled at 120/70s. Initial orthostatic symptoms that have since resolved.  -Continue lisinopril-HTCZ 20mg -12.5mg  (1.5 pills per day) -Continue metop 25mg  XL  #HLD: Managed by PCP. Goal LDL <70 given aortic dilation -Continue crestor 10mg  daily   Medication Adjustments/Labs and Tests Ordered: Current medicines are reviewed at length with the patient today.  Concerns regarding medicines are outlined above.  Orders Placed This Encounter  Procedures   CT CORONARY MORPH W/CTA COR W/SCORE W/CA W/CM &/OR WO/CM   Basic metabolic panel   EKG 78-GNFA    No orders of the defined types were placed in this encounter.    Patient Instructions  Medication Instructions:  Your physician recommends that you continue on your current medications as directed. Please refer to the Current Medication list given to you today.  *If you need a refill on your cardiac medications before your next appointment, please call your pharmacy*   Lab Work: TODAY:  BMET   If you have labs (blood work) drawn today and your tests are completely normal, you will receive your results only by: [place] (if you have MyChart) OR A paper copy in the mail If you have any lab test that is abnormal or we need to change your treatment, we will call you to review the results.   Testing/Procedures: Your physician has requested that you have cardiac CT. Cardiac computed tomography (CT) is a painless test that uses an x-ray machine to take clear, detailed pictures of your  heart. For further information  please visit [email or web address]. Please follow instruction sheet BELOW:     Your cardiac CT will be scheduled at one of the below locations:   [facility] [address] [place], [place] [address] [phone]  At [facility], please arrive at the [facility] main entrance (entrance A) of [facility] 30 minutes prior to test start time. You can use the FREE valet parking offered at the main entrance (encouraged to control the heart rate for the test) Proceed to the [facility] Radiology Department (first floor) to check-in and test prep.  Please follow these instructions carefully (unless otherwise directed):  On the Night Before the Test: Be sure to Drink plenty of water. Do not consume any caffeinated/decaffeinated beverages or chocolate 12 hours prior to your test. Do not take any antihistamines 12 hours prior to your test.   On the Day of the Test: Drink plenty of water until 1 hour prior to the test. Do not eat any food 4 hours prior to the test. You may take your regular medications prior to the test.  Take metoprolol (Lopressor) two hours prior to test. HOLD Lisinopril/Hydrochlorothiazide morning of the test. FEMALES- please wear underwire-free bra if available, avoid dresses & tight clothing     After the Test: Drink plenty of water. After receiving IV contrast, you may experience a mild flushed feeling. This is normal. On occasion, you may experience a mild rash up to 24 hours after the test. This is not dangerous. If this occurs, you can take Benadryl 25 mg and increase your fluid intake. If you experience trouble breathing, this can be serious. If it is severe call 911 IMMEDIATELY. If it is mild, please call our office.  Please allow 2-4 weeks for scheduling of routine cardiac CTs. Some insurance companies require a pre-authorization which may delay scheduling of this test.   For non-scheduling related  questions, please contact the cardiac imaging nurse navigator should you have any questions/concerns: Marchia Bond, Cardiac Imaging Nurse Navigator Gordy Clement, Cardiac Imaging Nurse Navigator [facility] Direct Office Dial: [phone]   For scheduling needs, including cancellations and rescheduling, please call [place], [phone].     Follow-Up: At [facility], you and your health needs are our priority.  As part of our continuing mission to provide you with exceptional heart care, we have created designated Provider Care Teams.  These Care Teams include your primary Cardiologist (physician) and Advanced Practice Providers (APPs -  Physician Assistants and Nurse Practitioners) who all work together to provide you with the care you need, when you need it.  We recommend signing up for the patient portal called "MyChart".  Sign up information is provided on this After Visit Summary.  MyChart is used to connect with patients for Virtual Visits (Telemedicine).  Patients are able to view lab/test results, encounter notes, upcoming appointments, etc.  Non-urgent messages can be sent to your provider as well.   To learn more about what you can do with MyChart, go to [email or web address].    Your next appointment:   6 month(s)  The format for your next appointment:   In Person  Provider:   [name], MD     Other Instructions     Signed, [name], MD  05/25/2021 10:18 AM    [place]

## 2021-05-25 ENCOUNTER — Encounter: Payer: Self-pay | Admitting: Cardiology

## 2021-05-25 ENCOUNTER — Other Ambulatory Visit: Payer: Self-pay

## 2021-05-25 ENCOUNTER — Ambulatory Visit: Payer: Medicare Other | Admitting: Cardiology

## 2021-05-25 VITALS — BP 118/70 | HR 55 | Ht 66.0 in | Wt 187.0 lb

## 2021-05-25 DIAGNOSIS — I200 Unstable angina: Secondary | ICD-10-CM

## 2021-05-25 DIAGNOSIS — I77819 Aortic ectasia, unspecified site: Secondary | ICD-10-CM | POA: Diagnosis not present

## 2021-05-25 DIAGNOSIS — I10 Essential (primary) hypertension: Secondary | ICD-10-CM

## 2021-05-25 DIAGNOSIS — I480 Paroxysmal atrial fibrillation: Secondary | ICD-10-CM

## 2021-05-25 DIAGNOSIS — I712 Thoracic aortic aneurysm, without rupture, unspecified: Secondary | ICD-10-CM | POA: Diagnosis not present

## 2021-05-25 DIAGNOSIS — R0609 Other forms of dyspnea: Secondary | ICD-10-CM | POA: Diagnosis not present

## 2021-05-25 DIAGNOSIS — E785 Hyperlipidemia, unspecified: Secondary | ICD-10-CM

## 2021-05-25 LAB — BASIC METABOLIC PANEL
BUN/Creatinine Ratio: 30 — ABNORMAL HIGH (ref 12–28)
BUN: 28 mg/dL — ABNORMAL HIGH (ref 8–27)
CO2: 23 mmol/L (ref 20–29)
Calcium: 9.5 mg/dL (ref 8.7–10.3)
Chloride: 103 mmol/L (ref 96–106)
Creatinine, Ser: 0.94 mg/dL (ref 0.57–1.00)
Glucose: 107 mg/dL — ABNORMAL HIGH (ref 70–99)
Potassium: 3.7 mmol/L (ref 3.5–5.2)
Sodium: 142 mmol/L (ref 134–144)
eGFR: 64 mL/min/{1.73_m2} (ref >59)

## 2021-05-25 NOTE — Patient Instructions (Addendum)
Medication Instructions:  Your physician recommends that you continue on your current medications as directed. Please refer to the Current Medication list given to you today.  *If you need a refill on your cardiac medications before your next appointment, please call your pharmacy*   Lab Work: TODAY:  BMET   If you have labs (blood work) drawn today and your tests are completely normal, you will receive your results only by: [place] (if you have MyChart) OR A paper copy in the mail If you have any lab test that is abnormal or we need to change your treatment, we will call you to review the results.   Testing/Procedures: Your physician has requested that you have cardiac CT. Cardiac computed tomography (CT) is a painless test that uses an x-ray machine to take clear, detailed pictures of your heart. For further information please visit [email or web address]. Please follow instruction sheet BELOW:     Your cardiac CT will be scheduled at one of the below locations:   [facility] [address] [place], [place] [address] [phone]  At [facility], please arrive at the [facility] main entrance (entrance A) of [facility] 30 minutes prior to test start time. You can use the FREE valet parking offered at the main entrance (encouraged to control the heart rate for the test) Proceed to the [facility] Radiology Department (first floor) to check-in and test prep.  Please follow these instructions carefully (unless otherwise directed):  On the Night Before the Test: Be sure to Drink plenty of water. Do not consume any caffeinated/decaffeinated beverages or chocolate 12 hours prior to your test. Do not take any antihistamines 12 hours prior to your test.   On the Day of the Test: Drink plenty of water until 1 hour prior to the test. Do not eat any food 4 hours prior to the test. You may take your regular medications prior to the test.  Take  metoprolol (Lopressor) two hours prior to test. HOLD Lisinopril/Hydrochlorothiazide morning of the test. FEMALES- please wear underwire-free bra if available, avoid dresses & tight clothing     After the Test: Drink plenty of water. After receiving IV contrast, you may experience a mild flushed feeling. This is normal. On occasion, you may experience a mild rash up to 24 hours after the test. This is not dangerous. If this occurs, you can take Benadryl 25 mg and increase your fluid intake. If you experience trouble breathing, this can be serious. If it is severe call 911 IMMEDIATELY. If it is mild, please call our office.  Please allow 2-4 weeks for scheduling of routine cardiac CTs. Some insurance companies require a pre-authorization which may delay scheduling of this test.   For non-scheduling related questions, please contact the cardiac imaging nurse navigator should you have any questions/concerns: Marchia Bond, Cardiac Imaging Nurse Navigator Gordy Clement, Cardiac Imaging Nurse Navigator [place] [facility] Direct Office Dial: [phone]   For scheduling needs, including cancellations and rescheduling, please call [place], [phone].     Follow-Up: At [facility], you and your health needs are our priority.  As part of our continuing mission to provide you with exceptional heart care, we have created designated Provider Care Teams.  These Care Teams include your primary Cardiologist (physician) and Advanced Practice Providers (APPs -  Physician Assistants and Nurse Practitioners) who all work together to provide you with the care you need, when you need it.  We recommend signing up  for the patient portal called "MyChart".  Sign up information is provided on this After Visit Summary.  MyChart is used to connect with patients for Virtual Visits (Telemedicine).  Patients are able to view lab/test results, encounter notes, upcoming appointments, etc.   Non-urgent messages can be sent to your provider as well.   To learn more about what you can do with MyChart, go to [email or web address].    Your next appointment:   6 month(s)  The format for your next appointment:   In Person  Provider:   [name], MD     Other Instructions

## 2021-06-06 DIAGNOSIS — J4540 Moderate persistent asthma, uncomplicated: Secondary | ICD-10-CM

## 2021-06-06 DIAGNOSIS — I10 Essential (primary) hypertension: Secondary | ICD-10-CM

## 2021-06-08 ENCOUNTER — Other Ambulatory Visit: Payer: Self-pay | Admitting: Adult Health

## 2021-06-08 DIAGNOSIS — F419 Anxiety disorder, unspecified: Secondary | ICD-10-CM

## 2021-06-08 DIAGNOSIS — F32A Depression, unspecified: Secondary | ICD-10-CM

## 2021-06-09 ENCOUNTER — Telehealth: Payer: Self-pay | Admitting: Pharmacist

## 2021-06-09 NOTE — Telephone Encounter (Signed)
Okay for refill?    LOV 05/08/2021  ALPRAZolam (XANAX) 0.25 MG tablet 30 tablet 0 05/08/2021

## 2021-06-09 NOTE — Chronic Care Management (AMB) (Signed)
Chronic Care Management Pharmacy Assistant   Name: Glenda Bauer  MRN: [id number] DOB: 29-Nov-1950  Reason for Encounter: Medication Review   Conditions to be addressed/monitored: HTN  Recent office visits:  None  Recent consult visits:  05/25/21 [name], MD (Cardiology) - Patient presented for Dyspnea on exertion and other concerns. No medication changes.  05/20/21 Gaye Pollack, MD (Cardiothoracic Surg) - Patient presented for Aneurysm of ascending aorta without rupture. No medication changes.  Hospital visits:  None in previous 6 months  Medications: Outpatient Encounter Medications as of 06/09/2021  Medication Sig   albuterol (PROVENTIL) (2.5 MG/3ML) 0.083% nebulizer solution USE 1 VIAL VIA NEBULIZER EVERY 6 HOURS AS NEEDED FOR WHEEZING OR SHORTNESS OF BREATH   albuterol (VENTOLIN HFA) 108 (90 Base) MCG/ACT inhaler Inhale 2 puffs into the lungs every 6 (six) hours as needed for wheezing or shortness of breath.   ALPRAZolam (XANAX) 0.25 MG tablet TAKE 1 TO 2 TABLETS BY MOUTH ONCE daily AS NEEDED   budesonide-formoterol (SYMBICORT) 160-4.5 MCG/ACT inhaler Inhale 2 puffs into the lungs 2 (two) times daily.   famotidine (PEPCID) 20 MG tablet Take 20 mg by mouth daily.   flecainide (TAMBOCOR) 50 MG tablet Take 1 tablet (50 mg total) by mouth 2 (two) times daily.   FLUoxetine (PROZAC) 20 MG capsule TAKE 1 CAPSULE(20 MG) BY MOUTH DAILY   fluticasone (FLONASE) 50 MCG/ACT nasal spray Place 2 sprays into both nostrils as needed.   levocetirizine (XYZAL) 5 MG tablet Take 5 mg by mouth as needed for allergies.   lisinopril-hydrochlorothiazide (ZESTORETIC) 20-12.5 MG tablet Take 2 tablets by mouth daily.   metoprolol succinate (TOPROL-XL) 25 MG 24 hr tablet Take 1 tablet (25 mg total) by mouth daily. Take with or immediately following a meal.   montelukast (SINGULAIR) 10 MG tablet Take 1 tablet (10 mg total) by mouth at bedtime.   Multiple Vitamin (MULTIVITAMIN) tablet  Take 1 tablet by mouth daily.   Multiple Vitamins-Minerals (VISION FORMULA PO) Take by mouth daily.    Probiotic Product (ALIGN) 4 MG CAPS Take by mouth daily.   rivaroxaban (XARELTO) 20 MG TABS tablet TAKE 1 TABLET(20 MG) BY MOUTH DAILY WITH SUPPER   rosuvastatin (CRESTOR) 10 MG tablet Take 1 tablet (10 mg total) by mouth daily.   triamcinolone cream (KENALOG) 0.1 % APPLY TOPICALLY TWICE DAILY   No facility-administered encounter medications on file as of 06/09/2021.  Reviewed chart prior to disease state call. Spoke with patient regarding BP  Recent Office Vitals: BP Readings from Last 3 Encounters:  05/25/21 118/70  05/20/21 128/78  05/08/21 114/70   Pulse Readings from Last 3 Encounters:  05/25/21 (!) 55  05/20/21 62  05/08/21 65    Wt Readings from Last 3 Encounters:  05/25/21 187 lb (84.8 kg)  05/20/21 181 lb (82.1 kg)  05/08/21 186 lb (84.4 kg)     Kidney Function Lab Results  Component Value Date/Time   CREATININE 0.94 05/25/2021 09:46 AM   CREATININE 0.92 05/08/2021 09:34 AM   CREATININE 0.89 03/27/2020 09:17 AM   GFR 62.30 05/08/2021 09:34 AM   GFRNONAA 59 (L) 05/21/2020 11:21 AM   GFRNONAA >60 04/17/2020 12:30 PM   GFRNONAA 65 03/27/2020 09:17 AM   GFRAA 68 05/21/2020 11:21 AM   GFRAA 76 03/27/2020 09:17 AM    BMP Latest Ref Rng & Units 05/25/2021 05/08/2021 11/12/2020  Glucose 70 - 99 mg/dL 107(H) 99 122(H)  BUN 8 - 27 mg/dL 28(H) 20 16  Creatinine  0.57 - 1.00 mg/dL 0.94 0.92 0.92  BUN/Creat Ratio 12 - 28 30(H) - 17  Sodium 134 - 144 mmol/L 142 140 141  Potassium 3.5 - 5.2 mmol/L 3.7 4.0 3.6  Chloride 96 - 106 mmol/L 103 104 104  CO2 20 - 29 mmol/L 23 28 21   Calcium 8.7 - 10.3 mg/dL 9.5 10.1 8.8    Current antihypertensive regimen:  Lisinopril - HCTZ 20-12.5 1.5 tablets daily (rx said 2 tablets daily) - every 2 hours going to the bathroom Metoprolol succinate 50 mg 1/2 tablet daily   Reviewed chart for medication changes ahead of medication  coordination call.  No OVs, Consults, or hospital visits since last care coordination call/Pharmacist visit.No medication changes indicated OR if recent visit, treatment plan here.  BP Readings from Last 3 Encounters:  05/25/21 118/70  05/20/21 128/78  05/08/21 114/70    Lab Results  Component Value Date   HGBA1C 5.6 05/08/2021     Patient obtains medications through Adherence Packaging  30 Days   Last adherence delivery included:  Rosuvastatin (Crestor) 10 mg - Take one at Breakfast Flecainide (Tambocor) 50 mg - Take one at Breakfast and one at Bedtime Rivaroxaban (Xarelto) 20 mg - Take one at Evening Meal Fluoxetine (Prozac) 20 mg - Take one at Breakfast Metropolol Succinate (Toprolol-XL) 25 mg - Take one at Breakfast Montelukast (Singulair) 10 mg - Take one at Bedtime Lisinopril-HCTZ (Zestoric) 20-12.5 mg - Take two tablets at Breakfast Alprazolam (Xanax) - Take 1-2 tabs a day as needed Albuterol 2.5 mg - inhale contents of 1 vial every 6 hours via nebulizer as needed   Patient is due for next adherence delivery on: 06/22/21. Called patient and reviewed medications and coordinated delivery. Confirmed packaging for 30 DS  This delivery to include: Rosuvastatin (Crestor) 10 mg - Take one at Breakfast Flecainide (Tambocor) 50 mg - Take one at Breakfast and one at Bedtime Fluoxetine (Prozac) 20 mg - Take one at Breakfast Rivaroxaban (Xarelto) 20 mg - Take one at Evening Meal Montelukast (Singulair) 10 mg - Take one at Bedtime Metropolol Succinate (Toprolol-XL) 25 mg - Take one at Breakfast Lisinopril-HCTZ (Zestoric) 20-12.5 mg - Take two tablets at Breakfast Albuterol 2.5 mg - inhale contents of 1 vial every 6 hours via nebulizer as needed Alprazolam (Xanax) - Take 1-2 tabs a day as needed Patient will need a short fill of (med), prior to adherence delivery. (To align with sync date or if PRN med)  delivery date of 06/22/21,  Attempted to reach patient/ emergency contact both  numbers not taking calls several attempts to coordinate medication delivery made. CCM Deadline 06/11/21 5 pm  Care Gaps: BP - 118/70 (05/25/21) TDAP - Overdue Zoster vaccine - Overdue Pneumovax - Overdue CPP follow up- 6/23 Last AWV 09/29/20   Star Rating Drugs: Rosuvastatin 10 MG - Last filled 05/14/2021 30 DS at [facility] Lisinopril- HCTZ 20/12.5 mg - Last filled 05/14/2021 30 DS at [facility]   Patient Assistance: Symbicort - Approved for [place] Pharmacist Assistant [phone]

## 2021-06-12 ENCOUNTER — Telehealth (HOSPITAL_COMMUNITY): Payer: Self-pay | Admitting: *Deleted

## 2021-06-12 NOTE — Telephone Encounter (Signed)
Attempted to call patient regarding upcoming cardiac CT appointment. Unable to leave a message for patient to return call.  Gordy Clement RN Navigator Cardiac Imaging [facility] [phone] Office [phone] Cell

## 2021-06-15 ENCOUNTER — Other Ambulatory Visit: Payer: Self-pay | Admitting: Cardiovascular Disease

## 2021-06-15 ENCOUNTER — Encounter (HOSPITAL_COMMUNITY): Payer: Self-pay

## 2021-06-15 ENCOUNTER — Ambulatory Visit (HOSPITAL_COMMUNITY)
Admission: RE | Admit: 2021-06-15 | Discharge: 2021-06-15 | Disposition: A | Discharge status: Home / Self Care | Payer: Medicare Other | Source: Ambulatory Visit | Attending: Cardiology | Admitting: Cardiology

## 2021-06-15 ENCOUNTER — Other Ambulatory Visit: Payer: Self-pay

## 2021-06-15 DIAGNOSIS — I200 Unstable angina: Secondary | ICD-10-CM | POA: Insufficient documentation

## 2021-06-15 DIAGNOSIS — I2510 Atherosclerotic heart disease of native coronary artery without angina pectoris: Secondary | ICD-10-CM | POA: Diagnosis not present

## 2021-06-15 DIAGNOSIS — R0789 Other chest pain: Secondary | ICD-10-CM

## 2021-06-15 DIAGNOSIS — R931 Abnormal findings on diagnostic imaging of heart and coronary circulation: Secondary | ICD-10-CM | POA: Diagnosis not present

## 2021-06-15 MED ORDER — NITROGLYCERIN 0.4 MG SL SUBL
SUBLINGUAL_TABLET | SUBLINGUAL | Status: AC
  Filled 2021-06-15: qty 2

## 2021-06-15 MED ORDER — NITROGLYCERIN 0.4 MG SL SUBL
0.8000 mg | SUBLINGUAL_TABLET | Freq: Once | SUBLINGUAL | Status: AC
  Administered 2021-06-15: 0.8 mg via SUBLINGUAL

## 2021-06-15 MED ORDER — IOHEXOL 350 MG/ML SOLN
95.0000 mL | Freq: Once | INTRAVENOUS | Status: AC | PRN
  Administered 2021-06-15: 95 mL via INTRAVENOUS

## 2021-06-16 ENCOUNTER — Ambulatory Visit (HOSPITAL_COMMUNITY)
Admission: RE | Admit: 2021-06-16 | Discharge: 2021-06-16 | Disposition: A | Discharge status: Home / Self Care | Payer: Medicare Other | Source: Ambulatory Visit | Attending: Cardiovascular Disease | Admitting: Cardiovascular Disease

## 2021-06-16 DIAGNOSIS — R931 Abnormal findings on diagnostic imaging of heart and coronary circulation: Secondary | ICD-10-CM | POA: Diagnosis not present

## 2021-06-16 DIAGNOSIS — R0789 Other chest pain: Secondary | ICD-10-CM | POA: Insufficient documentation

## 2021-06-17 ENCOUNTER — Encounter: Payer: Self-pay | Admitting: Cardiology

## 2021-06-18 ENCOUNTER — Encounter: Payer: Self-pay | Admitting: Cardiology

## 2021-06-29 NOTE — Progress Notes (Signed)
Synopsis: Referred for excessive daytime sleepiness, fatigue by Glenda Peng, NP  Subjective:   PATIENT ID: Glenda Bauer GENDER: female DOB: 09/12/1950, MRN: [id number]  Chief Complaint  Patient presents with   Pulmonary Consult    Referred by Glenda Peng, NP. Pt c/o fatigue and SOB with exertion such as walking 1/4 mile. She states she has had asthma since age 71. Her breathing has been worse over the past 1 1/2 years. She rarely uses her albuterol.    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   She says she had covid last April, deconditioned afterward holding off on exercise for a while due to fatigue. Cough post covid has finally improved somewhat. Very sleepy as well, naps twice daily now. She does snore loudly. No PND but son thinks she does have it.    Has not needed steroids for asthma since her sinus surgery done with [facility] (done for sinusitis 5ya). She says she did have nasal polyps. 30 years ago was in asthma clinic in [place]. Did have allergy shots many years ago. Very steroid responsive in past.  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 She has lived in [place] for several years, [place] for 5y, [place] for 5y. No recent travel. Has a dog at home.    Otherwise pertinent review of systems is negative.    Past Medical History:  Diagnosis Date   Allergy    Anxiety    Asthma    Atrial fibrillation ([place])    Cancer (HCC)    Melanoma and Mycosis Fungosis   Cataract    Cutaneous T-cell lymphoma ([place]) 2011   Dermatologist treated in [place]; Dr. Hipolito Bayley   Depression    GERD  (gastroesophageal reflux disease)    Heart murmur    Hypertension    Neuromuscular disorder ([place])    Osteoarthritis    Sebaceous cyst 2022   right vulva   Thyroid nodule    Urinary tract infection      Family History  Problem Relation Age of Onset   Arthritis Mother    Ovarian cancer Mother        Metastatic   Breast cancer Mother 87   Thyroid disease Mother    Parkinson's disease Father    Crohn's disease Brother    Schizophrenia Brother    Colon cancer Neg Hx    Esophageal cancer Neg Hx    Rectal cancer Neg Hx    Stomach cancer Neg Hx      Past Surgical History:  Procedure Laterality Date   Cataracts Bilateral    CHOLECYSTECTOMY  2015   melenoma removal  1979   Sinus Polyps       Social History   Socioeconomic History   Marital status: Divorced    Spouse name: Not on file   Number of children: 3   Years of education: 16   Highest education level: Associate degree: academic program  Occupational History   Occupation: Medical sales representative   Tobacco Use   Smoking status: Former    Packs/day: 0.25    Years: 10.00    Pack years:  2.50    Types: Cigarettes    Quit date: 06/07/1981    Years since quitting: 40.0   Smokeless tobacco: Never  Vaping Use   Vaping Use: Never used  Substance and Sexual Activity   Alcohol use: No    Alcohol/week: 0.0 standard drinks   Drug use: No   Sexual activity: Not Currently    Birth control/protection: Post-menopausal  Other Topics Concern   Not on file  Social History Narrative   Separated    Three children ( one lives locally)    Social Determinants of Health   Financial Resource Strain: Low Risk    Difficulty of Paying Living Expenses: Not hard at all  Food Insecurity: No Food Insecurity   Worried About Charity fundraiser in the Last Year: Never true   Arboriculturist in the Last Year: Never true  Transportation Needs: No Transportation Needs   Lack of Transportation (Medical): No   Lack of Transportation (Non-Medical): No   Physical Activity: Insufficiently Active   Days of Exercise per Week: 5 days   Minutes of Exercise per Session: 20 min  Stress: Stress Concern Present   Feeling of Stress : Rather much  Social Connections: Moderately Isolated   Frequency of Communication with Friends and Family: More than three times a week   Frequency of Social Gatherings with Friends and Family: Twice a week   Attends Religious Services: Never   Marine scientist or Organizations: No   Attends Music therapist: 1 to 4 times per year   Marital Status: Divorced  Human resources officer Violence: Not At Risk   Fear of Current or Ex-Partner: No   Emotionally Abused: No   Physically Abused: No   Sexually Abused: No     No Known Allergies   Outpatient Medications Prior to Visit  Medication Sig Dispense Refill   albuterol (PROVENTIL) (2.5 MG/3ML) 0.083% nebulizer solution USE 1 VIAL VIA NEBULIZER EVERY 6 HOURS AS NEEDED FOR WHEEZING OR SHORTNESS OF BREATH 225 mL 0   albuterol (VENTOLIN HFA) 108 (90 Base) MCG/ACT inhaler Inhale 2 puffs into the lungs every 6 (six) hours as needed for wheezing or shortness of breath. 8 g 2   ALPRAZolam (XANAX) 0.25 MG tablet TAKE 1 TO 2 TABLETS BY MOUTH ONCE daily AS NEEDED 30 tablet 2   budesonide-formoterol (SYMBICORT) 160-4.5 MCG/ACT inhaler Inhale 2 puffs into the lungs 2 (two) times daily. 1 each 11   famotidine (PEPCID) 20 MG tablet Take 20 mg by mouth daily.     flecainide (TAMBOCOR) 50 MG tablet Take 1 tablet (50 mg total) by mouth 2 (two) times daily. 60 tablet 5   FLUoxetine (PROZAC) 20 MG capsule TAKE 1 CAPSULE(20 MG) BY MOUTH DAILY 90 capsule 1   fluticasone (FLONASE) 50 MCG/ACT nasal spray Place 2 sprays into both nostrils as needed.     levocetirizine (XYZAL) 5 MG tablet Take 5 mg by mouth as needed for allergies.     lisinopril-hydrochlorothiazide (ZESTORETIC) 20-12.5 MG tablet Take 2 tablets by mouth daily. 180 tablet 2   metoprolol succinate (TOPROL-XL) 25 MG  24 hr tablet Take 1 tablet (25 mg total) by mouth daily. Take with or immediately following a meal. 90 tablet 3   montelukast (SINGULAIR) 10 MG tablet Take 1 tablet (10 mg total) by mouth at bedtime. 90 tablet 3   Multiple Vitamin (MULTIVITAMIN) tablet Take 1 tablet by mouth daily.     Multiple Vitamins-Minerals (VISION FORMULA PO) Take  by mouth daily.      Omega-3 Fatty Acids (FISH OIL PO) Take 1 capsule by mouth daily.     Probiotic Product (ALIGN) 4 MG CAPS Take by mouth daily.     rivaroxaban (XARELTO) 20 MG TABS tablet TAKE 1 TABLET(20 MG) BY MOUTH DAILY WITH SUPPER 90 tablet 3   rosuvastatin (CRESTOR) 10 MG tablet Take 1 tablet (10 mg total) by mouth daily. 90 tablet 2   triamcinolone cream (KENALOG) 0.1 % APPLY TOPICALLY TWICE DAILY 454 g 0   No facility-administered medications prior to visit.       Objective:   Physical Exam:  General appearance: 71 y.o., female, NAD, conversant  Eyes: anicteric sclerae; PERRL, tracking appropriately HENT: NCAT; MMM Neck: Trachea midline; no lymphadenopathy, no JVD Lungs: CTAB, no crackles, no wheeze, with normal respiratory effort CV: RRR, no murmur  Abdomen: Soft, non-tender; non-distended, BS present  Extremities: No peripheral edema, warm Skin: Normal turgor and texture; no rash Psych: Appropriate affect Neuro: Alert and oriented to person and place, no focal deficit     Vitals:   07/01/21 0937  BP: 108/66  Pulse: 60  Temp: 98.2 F (36.8 C)  TempSrc: Oral  SpO2: 94%  Weight: 187 lb (84.8 kg)  Height: 5\' 6"  (1.676 m)   94% on RA BMI Readings from Last 3 Encounters:  07/01/21 30.18 kg/m  05/25/21 30.18 kg/m  05/20/21 29.21 kg/m   Wt Readings from Last 3 Encounters:  07/01/21 187 lb (84.8 kg)  05/25/21 187 lb (84.8 kg)  05/20/21 181 lb (82.1 kg)     CBC    Component Value Date/Time   WBC 5.1 05/08/2021 0934   RBC 4.80 05/08/2021 0934   HGB 14.4 05/08/2021 0934   HCT 42.4 05/08/2021 0934   PLT 229.0  05/08/2021 0934   MCV 88.4 05/08/2021 0934   MCH 30.8 03/27/2020 0917   MCHC 34.1 05/08/2021 0934   RDW 13.3 05/08/2021 0934   LYMPHSABS 1.5 05/08/2021 0934   MONOABS 0.5 05/08/2021 0934   EOSABS 0.3 05/08/2021 0934   BASOSABS 0.1 05/08/2021 0934   Eos 300  Chest Imaging:  Coronary CT 06/15/21 reviewed by me remarkable for hiatal hernia  Pulmonary Functions Testing Results: No flowsheet data found.   Echocardiogram:   TTE 04/2020 indeterminate for dd      Assessment & Plan:   # DOE # Mild persistent asthma # Mild eosinophilia # Nasal polyposis s/p sinus surgery Worse DOE could be multifactorial deconditioning post covid, untreated sleep-disordered breathing, symptomatic nCAD/CAD given CTA findings, chronotropic insufficiency on beta blockade, suboptimal symbicort use or inadequate asthma regimen.    # Excessive daytime sleepiness # Snoring # PND Needs sleep study.   Plan: - home sleep study - symbicort 2 puffs twice daily with spacer, rinsing mouth afterward, instructed in ideal use - PFTs. If has developed component of fixed obstruction could add LAMA.  - applauded her efforts at staying active - if no major unaddressed component of lung disease on PFT and sleep study either without OSA or shows just mild OSA then may need consideration of LHC   RTC 2-4 weeks with me or APP  [name], MD [facility] 07/01/2021 10:11 AM

## 2021-07-01 ENCOUNTER — Encounter: Payer: Self-pay | Admitting: Student

## 2021-07-01 ENCOUNTER — Other Ambulatory Visit: Payer: Self-pay

## 2021-07-01 ENCOUNTER — Ambulatory Visit: Payer: Medicare Other | Admitting: Student

## 2021-07-01 VITALS — BP 108/66 | HR 60 | Temp 98.2°F | Ht 66.0 in | Wt 187.0 lb

## 2021-07-01 DIAGNOSIS — R0683 Snoring: Secondary | ICD-10-CM | POA: Diagnosis not present

## 2021-07-01 DIAGNOSIS — R0609 Other forms of dyspnea: Secondary | ICD-10-CM

## 2021-07-01 NOTE — Patient Instructions (Addendum)
-   You will be called to schedule your sleep study - use your symbicort 2 puffs twice daily with a spacer - see you in 2-4 weeks

## 2021-07-08 ENCOUNTER — Other Ambulatory Visit: Payer: Self-pay | Admitting: Adult Health

## 2021-07-08 DIAGNOSIS — J4540 Moderate persistent asthma, uncomplicated: Secondary | ICD-10-CM

## 2021-07-09 ENCOUNTER — Telehealth: Payer: Self-pay | Admitting: Pharmacist

## 2021-07-09 NOTE — Chronic Care Management (AMB) (Addendum)
Chronic Care Management Pharmacy Assistant   Name: Glenda Bauer  MRN: [id number] DOB: 1951/05/05  Reason for Encounter: Medication Review Medication Coordination   Conditions to be addressed/monitored: HTN  Recent office visits:  None  Recent consult visits:  07/01/21 Glenda Hurter, MD (Pulmonology) - Patient presented for snoring and other concerns. No medication changes.  Hospital visits:  None in previous 6 months  Medications: Outpatient Encounter Medications as of 07/09/2021  Medication Sig   albuterol (PROVENTIL) (2.5 MG/3ML) 0.083% nebulizer solution INHALE THE contents of ONE vial via NEBULIZER EVERY 6 HOURS AS NEEDED FOR WHEEZING OR FOR SHORTNESS OF BREATH   albuterol (VENTOLIN HFA) 108 (90 Base) MCG/ACT inhaler Inhale 2 puffs into the lungs every 6 (six) hours as needed for wheezing or shortness of breath.   ALPRAZolam (XANAX) 0.25 MG tablet TAKE 1 TO 2 TABLETS BY MOUTH ONCE daily AS NEEDED   budesonide-formoterol (SYMBICORT) 160-4.5 MCG/ACT inhaler Inhale 2 puffs into the lungs 2 (two) times daily.   famotidine (PEPCID) 20 MG tablet Take 20 mg by mouth daily.   flecainide (TAMBOCOR) 50 MG tablet Take 1 tablet (50 mg total) by mouth 2 (two) times daily.   FLUoxetine (PROZAC) 20 MG capsule TAKE 1 CAPSULE(20 MG) BY MOUTH DAILY   fluticasone (FLONASE) 50 MCG/ACT nasal spray Place 2 sprays into both nostrils as needed.   levocetirizine (XYZAL) 5 MG tablet Take 5 mg by mouth as needed for allergies.   lisinopril-hydrochlorothiazide (ZESTORETIC) 20-12.5 MG tablet Take 2 tablets by mouth daily.   metoprolol succinate (TOPROL-XL) 25 MG 24 hr tablet Take 1 tablet (25 mg total) by mouth daily. Take with or immediately following a meal.   montelukast (SINGULAIR) 10 MG tablet Take 1 tablet (10 mg total) by mouth at bedtime.   Multiple Vitamin (MULTIVITAMIN) tablet Take 1 tablet by mouth daily.   Multiple Vitamins-Minerals (VISION FORMULA PO) Take by mouth daily.     Omega-3 Fatty Acids (FISH OIL PO) Take 1 capsule by mouth daily.   Probiotic Product (ALIGN) 4 MG CAPS Take by mouth daily.   rivaroxaban (XARELTO) 20 MG TABS tablet TAKE 1 TABLET(20 MG) BY MOUTH DAILY WITH SUPPER   rosuvastatin (CRESTOR) 10 MG tablet Take 1 tablet (10 mg total) by mouth daily.   triamcinolone cream (KENALOG) 0.1 % APPLY TOPICALLY TWICE DAILY   No facility-administered encounter medications on file as of 07/09/2021.  Reviewed chart prior to disease state call. Spoke with patient regarding BP  Recent Office Vitals: BP Readings from Last 3 Encounters:  07/01/21 108/66  06/15/21 100/60  05/25/21 118/70   Pulse Readings from Last 3 Encounters:  07/01/21 60  06/15/21 65  05/25/21 (!) 55    Wt Readings from Last 3 Encounters:  07/01/21 187 lb (84.8 kg)  05/25/21 187 lb (84.8 kg)  05/20/21 181 lb (82.1 kg)     Kidney Function Lab Results  Component Value Date/Time   CREATININE 0.94 05/25/2021 09:46 AM   CREATININE 0.92 05/08/2021 09:34 AM   CREATININE 0.89 03/27/2020 09:17 AM   GFR 62.30 05/08/2021 09:34 AM   GFRNONAA 59 (L) 05/21/2020 11:21 AM   GFRNONAA >60 04/17/2020 12:30 PM   GFRNONAA 65 03/27/2020 09:17 AM   GFRAA 68 05/21/2020 11:21 AM   GFRAA 76 03/27/2020 09:17 AM    BMP Latest Ref Rng & Units 05/25/2021 05/08/2021 11/12/2020  Glucose 70 - 99 mg/dL 107(H) 99 122(H)  BUN 8 - 27 mg/dL 28(H) 20 16  Creatinine 0.57 - 1.00  mg/dL 0.94 0.92 0.92  BUN/Creat Ratio 12 - 28 30(H) - 17  Sodium 134 - 144 mmol/L 142 140 141  Potassium 3.5 - 5.2 mmol/L 3.7 4.0 3.6  Chloride 96 - 106 mmol/L 103 104 104  CO2 20 - 29 mmol/L 23 28 21   Calcium 8.7 - 10.3 mg/dL 9.5 10.1 8.8    Current antihypertensive regimen:  Lisinopril - HCTZ 20-12.5 1.5 tablets daily (rx said 2 tablets daily) - every 2 hours going to the bathroom Metoprolol succinate 50 mg 1/2 tablet daily How often are you checking your Blood Pressure? weekly Current home BP readings: Patient reports she just had  it checked at the Dr and that is the range she runs at home also, denies any headache, dizziness, lightheadedness 108/66 What recent interventions/DTPs have been made by any provider to improve Blood Pressure control since last CPP Visit: Patient reports none Any recent hospitalizations or ED visits since last visit with CPP? No   Adherence Review: Is the patient currently on ACE/ARB medication? No Does the patient have >5 day gap between last estimated fill dates? No   Reviewed chart for medication changes ahead of medication coordination call.  No OVs, Consults, or hospital visits since last care coordination call/Pharmacist visit.   No medication changes indicated  BP Readings from Last 3 Encounters:  07/01/21 108/66  06/15/21 100/60  05/25/21 118/70    Lab Results  Component Value Date   HGBA1C 5.6 05/08/2021     Patient obtains medications through Adherence Packaging  30 Days   Last adherence delivery included:  Rosuvastatin (Crestor) 10 mg - Take one at Breakfast Flecainide (Tambocor) 50 mg - Take one at Breakfast and one at Bedtime Fluoxetine (Prozac) 20 mg - Take one at Breakfast Rivaroxaban (Xarelto) 20 mg - Take one at Evening Meal Montelukast (Singulair) 10 mg - Take one at Bedtime Metropolol Succinate (Toprolol-XL) 25 mg - Take one at Breakfast Lisinopril-HCTZ (Zestoric) 20-12.5 mg - Take two tablets at Breakfast Albuterol 2.5 mg - inhale contents of 1 vial every 6 hours via nebulizer as needed Alprazolam (Xanax) - Take 1-2 tabs a day as needed    Patient is due for next adherence delivery on: 07/21/21. Called patient and reviewed medications and coordinated delivery. Packs for 30 DS  This delivery to include:  Rosuvastatin (Crestor) 10 mg - Take one tablet at Breakfast Flecainide (Tambocor) 50 mg - Take one tablet at Breakfast and one tablet at Bedtime Rivaroxaban (Xarelto) 20 mg - Take one tablet at Evening Meal Montelukast (Singulair) 10 mg - Take one  tablet at Bedtime Metropolol Succinate (Toprolol-XL) 25 mg - Take one tablet at Breakfast Lisinopril-HCTZ (Zestoric) 20-12.5 mg - Take two tablets at Breakfast Fluoxetine (Prozac) 20 mg - Take one capsule at Breakfast Alprazolam (Xanax) - Take 1-2 tablets per day as needed Albuterol HFA inhaler - use as needed  Patient declined the following medications due to on hand supply Albuterol 2.5 mg - inhale contents of 1 vial every 6 hours via nebulizer as needed  Added: Albuterol Inhaler  Confirmed delivery date of 07/21/21, advised patient that pharmacy will contact them the morning of delivery.   Care Gaps: BP - 108/66  p 60 ( 07/01/21) TDAP - Overdue Zoster vaccine - Overdue Pneumovax - Overdue CPP follow up- 6/23 Last AWV 09/29/20   Star Rating Drugs: Rosuvastatin 10 MG - Last filled 06/17/21 30 DS at [facility] Lisinopril- HCTZ 20/12.5 mg - Last filled 06/17/21 30 DS at [facility]  Patient Assistance:  Symbicort - Approved for [place] Clinical Pharmacist Assistant [phone]

## 2021-07-13 ENCOUNTER — Ambulatory Visit: Payer: Medicare Other

## 2021-07-13 ENCOUNTER — Other Ambulatory Visit: Payer: Self-pay

## 2021-07-13 DIAGNOSIS — G4733 Obstructive sleep apnea (adult) (pediatric): Secondary | ICD-10-CM

## 2021-07-13 DIAGNOSIS — R0683 Snoring: Secondary | ICD-10-CM

## 2021-07-17 DIAGNOSIS — G4733 Obstructive sleep apnea (adult) (pediatric): Secondary | ICD-10-CM | POA: Diagnosis not present

## 2021-07-28 ENCOUNTER — Ambulatory Visit: Payer: Medicare Other | Admitting: Adult Health

## 2021-07-28 ENCOUNTER — Other Ambulatory Visit: Payer: Self-pay

## 2021-07-28 ENCOUNTER — Encounter: Payer: Self-pay | Admitting: Adult Health

## 2021-07-28 ENCOUNTER — Ambulatory Visit (INDEPENDENT_AMBULATORY_CARE_PROVIDER_SITE_OTHER): Payer: Medicare Other | Admitting: Student

## 2021-07-28 VITALS — BP 106/66 | HR 66 | Temp 98.2°F | Ht 66.0 in | Wt 186.0 lb

## 2021-07-28 DIAGNOSIS — G4733 Obstructive sleep apnea (adult) (pediatric): Secondary | ICD-10-CM | POA: Diagnosis not present

## 2021-07-28 DIAGNOSIS — J4540 Moderate persistent asthma, uncomplicated: Secondary | ICD-10-CM

## 2021-07-28 DIAGNOSIS — R0609 Other forms of dyspnea: Secondary | ICD-10-CM

## 2021-07-28 LAB — PULMONARY FUNCTION TEST
DL/VA % pred: 97 %
DL/VA: 3.94 ml/min/mmHg/L
DLCO cor % pred: 98 %
DLCO cor: 20.43 ml/min/mmHg
DLCO unc % pred: 98 %
DLCO unc: 20.43 ml/min/mmHg
FEF 25-75 Post: 1.61 L/sec
FEF 25-75 Pre: 1.15 L/sec
FEF2575-%Change-Post: 39 %
FEF2575-%Pred-Post: 83 %
FEF2575-%Pred-Pre: 59 %
FEV1-%Change-Post: 13 %
FEV1-%Pred-Post: 96 %
FEV1-%Pred-Pre: 85 %
FEV1-Post: 2.32 L
FEV1-Pre: 2.04 L
FEV1FVC-%Change-Post: 15 %
FEV1FVC-%Pred-Pre: 84 %
FEV6-%Change-Post: -2 %
FEV6-%Pred-Post: 102 %
FEV6-%Pred-Pre: 105 %
FEV6-Post: 3.11 L
FEV6-Pre: 3.2 L
FEV6FVC-%Change-Post: -1 %
FEV6FVC-%Pred-Post: 103 %
FEV6FVC-%Pred-Pre: 104 %
FVC-%Change-Post: -1 %
FVC-%Pred-Post: 99 %
FVC-%Pred-Pre: 100 %
FVC-Post: 3.15 L
FVC-Pre: 3.2 L
Post FEV1/FVC ratio: 74 %
Post FEV6/FVC ratio: 99 %
Pre FEV1/FVC ratio: 64 %
Pre FEV6/FVC Ratio: 100 %
RV % pred: 119 %
RV: 2.79 L
TLC % pred: 110 %
TLC: 5.89 L

## 2021-07-28 LAB — POCT EXHALED NITRIC OXIDE: FeNO level (ppb): 44

## 2021-07-28 NOTE — Progress Notes (Signed)
Full PFT performed today. °

## 2021-07-28 NOTE — Addendum Note (Signed)
Addended by: Vanessa Barbara on: 07/28/2021 01:49 PM   Modules accepted: Orders

## 2021-07-28 NOTE — Patient Instructions (Signed)
Full PFT performed today. °

## 2021-07-28 NOTE — Patient Instructions (Addendum)
Continue on Symbicort 2 puffs Twice daily  ,use with spacer.  Continue on Singulair 10mg  daily  Continue on Xyzal 5mg  1/2 At bedtime   Albuterol inhaler As needed   Activity as tolerated.   Begin CPAP At bedtime   Work on healthy weight loss.  Wear CPAP each night all night .  Do not drive if sleepy  Follow up with Dr. Verlee Monte or Clemente Dewey NP in 3 months and As needed

## 2021-07-28 NOTE — Assessment & Plan Note (Signed)
Mild persistent asthma with allergic phenotype  PFT show reversible mild airflow obstruction elevated FENO at 44ppb  And elevated eosinophils  Would continue on Symbicort Nile Riggs /xyzal   Plan  Patient Instructions  Continue on Symbicort 2 puffs Twice daily  ,use with spacer.  Continue on Singulair 10mg  daily  Continue on Xyzal 5mg  1/2 At bedtime   Albuterol inhaler As needed   Activity as tolerated.   Begin CPAP At bedtime   Work on healthy weight loss.  Wear CPAP each night all night .  Do not drive if sleepy  Follow up with Dr. Verlee Monte or Allani Reber NP in 3 months and As needed

## 2021-07-28 NOTE — Assessment & Plan Note (Signed)
Mild OSA -went over treatment options with patient education  - discussed how weight can impact sleep and risk for sleep disordered breathing - discussed options to assist with weight loss: combination of diet modification, cardiovascular and strength training exercises   - had an extensive discussion regarding the adverse health consequences related to untreated sleep disordered breathing - specifically discussed the risks for hypertension, coronary artery disease, cardiac dysrhythmias, cerebrovascular disease, and diabetes - lifestyle modification discussed   - discussed how sleep disruption can increase risk of accidents, particularly when driving - safe driving practices were discussed   -wants to begin CPAP -CPAP 5-15 cm H2O .    Plan  Patient Instructions  Continue on Symbicort 2 puffs Twice daily  ,use with spacer.  Continue on Singulair 10mg  daily  Continue on Xyzal 5mg  1/2 At bedtime   Albuterol inhaler As needed   Activity as tolerated.   Begin CPAP At bedtime   Work on healthy weight loss.  Wear CPAP each night all night .  Do not drive if sleepy  Follow up with Dr. Verlee Monte or Lilybelle Mayeda NP in 3 months and As needed

## 2021-07-28 NOTE — Progress Notes (Signed)
@Patient  ID: Glenda Bauer, female    DOB: [birth year], 71 y.o.   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  Patient was seen last month for pulmonary consult for asthma and shortness of breath.  Patient complained of ongoing shortness of breath after she had COVID-19 infection in April 2022.  She had ongoing fatigue, cough and shortness of breath after COVID infection. Patient was recommended to use Symbicort twice daily on regular basis .  Since last visit she is feeling some better but still gets winded with walking. No chest pain or palpitations. Pulmonary function testing showed mild reversible airflow obstruction.,  FEV1 96%, ratio 74, FVC 99%, positive bronchodilator response, DLCO 98%. Exhaled nitric oxide testing today is 44ppb .  Trying to start back to walking about 1/2 mile twice daily.  No cough and wheezing . On ACE inhibitor  Works partime at home   Patient was also set up for home sleep study for snoring and daytime fatigue/sleepiness.  Home sleep study done on July 13, 2021 showed mild obstructive sleep apnea with AHI at 11/hour and SPO2 low at 85%. We discussed her sleep study results and went over treatment options including weight loss, positional sleep , oral appliance and/or CPAP therapy. Says she does not want to use oral appliance.  Wants to proceed with CPAP .     No Known Allergies  Immunization History  Administered Date(s) Administered   Fluad Quad(high Dose 65+) 03/27/2020   Influenza, High Dose Seasonal PF 02/13/2016, 06/21/2018,  02/15/2019   Influenza,inj,Quad PF,6+ Mos 05/21/2014   PFIZER(Purple Top)SARS-COV-2 Vaccination 08/06/2019, 09/04/2019, 04/05/2020    Past Medical History:  Diagnosis Date   Allergy    Anxiety    Asthma    Atrial fibrillation ([place])    Cancer ([place])    Melanoma and Mycosis Fungosis   Cataract    Cutaneous T-cell lymphoma ([place]) 2011   Dermatologist treated in [place]; Dr. Hipolito Bayley   Depression    GERD (gastroesophageal reflux disease)    Heart murmur    Hypertension    Neuromuscular disorder ([place])    Osteoarthritis    Sebaceous cyst 2022   right vulva   Thyroid nodule    Urinary tract infection     Tobacco History: Social History   Tobacco Use  Smoking Status Former   Packs/day: 0.25   Years: 10.00   Pack years: 2.50   Types: Cigarettes   Quit date: 06/07/1981   Years since quitting: 40.1  Smokeless Tobacco Never   Counseling given: Not Answered   Outpatient Medications Prior to Visit  Medication Sig Dispense Refill   albuterol (PROVENTIL) (2.5 MG/3ML) 0.083% nebulizer solution INHALE THE contents of ONE vial via NEBULIZER EVERY 6 HOURS AS NEEDED FOR WHEEZING OR FOR SHORTNESS OF BREATH 225 mL 0   albuterol (VENTOLIN HFA) 108 (90 Base) MCG/ACT inhaler Inhale 2 puffs into the lungs every 6 (six) hours as needed for wheezing or shortness of breath. 8 g 2   ALPRAZolam (XANAX) 0.25 MG tablet TAKE 1 TO 2 TABLETS BY MOUTH ONCE daily AS NEEDED 30  tablet 2   budesonide-formoterol (SYMBICORT) 160-4.5 MCG/ACT inhaler Inhale 2 puffs into the lungs 2 (two) times daily. 1 each 11   famotidine (PEPCID) 20 MG tablet Take 20 mg by mouth daily.     flecainide (TAMBOCOR) 50 MG tablet Take 1 tablet (50 mg total) by mouth 2 (two) times daily. 60 tablet 5   FLUoxetine (PROZAC) 20 MG capsule TAKE 1 CAPSULE(20 MG) BY MOUTH DAILY 90 capsule 1   fluticasone (FLONASE) 50 MCG/ACT nasal spray Place 2 sprays into both nostrils as needed.     levocetirizine (XYZAL) 5 MG tablet Take 5 mg by  mouth as needed for allergies.     lisinopril-hydrochlorothiazide (ZESTORETIC) 20-12.5 MG tablet Take 2 tablets by mouth daily. 180 tablet 2   metoprolol succinate (TOPROL-XL) 25 MG 24 hr tablet Take 1 tablet (25 mg total) by mouth daily. Take with or immediately following a meal. 90 tablet 3   montelukast (SINGULAIR) 10 MG tablet Take 1 tablet (10 mg total) by mouth at bedtime. 90 tablet 3   Multiple Vitamin (MULTIVITAMIN) tablet Take 1 tablet by mouth daily.     Multiple Vitamins-Minerals (VISION FORMULA PO) Take by mouth daily.      Omega-3 Fatty Acids (FISH OIL PO) Take 1 capsule by mouth daily.     Probiotic Product (ALIGN) 4 MG CAPS Take by mouth daily.     rivaroxaban (XARELTO) 20 MG TABS tablet TAKE 1 TABLET(20 MG) BY MOUTH DAILY WITH SUPPER 90 tablet 3   rosuvastatin (CRESTOR) 10 MG tablet Take 1 tablet (10 mg total) by mouth daily. 90 tablet 2   triamcinolone cream (KENALOG) 0.1 % APPLY TOPICALLY TWICE DAILY 454 g 0   No facility-administered medications prior to visit.     Review of Systems:   Constitutional:   No  weight loss, night sweats,  Fevers, chills,  +fatigue, or  lassitude.  HEENT:   No headaches,  Difficulty swallowing,  Tooth/dental problems, or  Sore throat,                No sneezing, itching, ear ache, nasal congestion, post nasal drip,   CV:  No chest pain,  Orthopnea, PND, swelling in lower extremities, anasarca, dizziness, palpitations, syncope.   GI  No heartburn, indigestion, abdominal pain, nausea, vomiting, diarrhea, change in bowel habits, loss of appetite, bloody stools.   Resp: No excess mucus, no productive cough,  No non-productive cough,  No coughing up of blood.  No change in color of mucus.  No wheezing.  No chest wall deformity  Skin: no rash or lesions.  GU: no dysuria, change in color of urine, no urgency or frequency.  No flank pain, no hematuria   MS:  No joint pain or swelling.  No decreased range of motion.  No back  pain.    Physical Exam  BP 106/66 (BP Location: Left Arm, Patient Position: Sitting, Cuff Size: Normal)    Pulse 66    Temp 98.2 F (36.8 C) (Oral)    Ht 5\' 6"  (1.676 m)    Wt 186 lb (84.4 kg)    LMP 06/07/2000 (Approximate)    SpO2 95%    BMI 30.02 kg/m   GEN: A/Ox3; pleasant , NAD, well nourished    HEENT:  [place]/AT,   NOSE-clear, THROAT-clear, no lesions, no postnasal drip or exudate noted.  Class 2-3 MP airway   NECK:  Supple w/ fair ROM; no JVD; normal carotid impulses w/o bruits; no thyromegaly or nodules palpated; no  lymphadenopathy.    RESP  Clear  P & A; w/o, wheezes/ rales/ or rhonchi. no accessory muscle use, no dullness to percussion  CARD:  RRR, no m/r/g, no peripheral edema, pulses intact, no cyanosis or clubbing.  GI:   Soft & nt; nml bowel sounds; no organomegaly or masses detected.   Musco: Warm bil, no deformities or joint swelling noted.   Neuro: alert, no focal deficits noted.    Skin: Warm, no lesions or rashes    Lab Results:  CBC    Component Value Date/Time   WBC 5.1 05/08/2021 0934   RBC 4.80 05/08/2021 0934   HGB 14.4 05/08/2021 0934   HCT 42.4 05/08/2021 0934   PLT 229.0 05/08/2021 0934   MCV 88.4 05/08/2021 0934   MCH 30.8 03/27/2020 0917   MCHC 34.1 05/08/2021 0934   RDW 13.3 05/08/2021 0934   LYMPHSABS 1.5 05/08/2021 0934   MONOABS 0.5 05/08/2021 0934   EOSABS 0.3 05/08/2021 0934   BASOSABS 0.1 05/08/2021 0934    BMET    Component Value Date/Time   NA 142 05/25/2021 0946   K 3.7 05/25/2021 0946   CL 103 05/25/2021 0946   CO2 23 05/25/2021 0946   GLUCOSE 107 (H) 05/25/2021 0946   GLUCOSE 99 05/08/2021 0934   BUN 28 (H) 05/25/2021 0946   CREATININE 0.94 05/25/2021 0946   CREATININE 0.89 03/27/2020 0917   CALCIUM 9.5 05/25/2021 0946   GFRNONAA 59 (L) 05/21/2020 1121   GFRNONAA >60 04/17/2020 1230   GFRNONAA 65 03/27/2020 0917   GFRAA 68 05/21/2020 1121   GFRAA 76 03/27/2020 0917    BNP No results found for:  BNP  ProBNP No results found for: PROBNP  Imaging: No results found.    PFT Results Latest Ref Rng & Units 07/28/2021  FVC-Pre L 3.20  FVC-Predicted Pre % 100  FVC-Post L 3.15  FVC-Predicted Post % 99  Pre FEV1/FVC % % 64  Post FEV1/FCV % % 74  FEV1-Pre L 2.04  FEV1-Predicted Pre % 85  FEV1-Post L 2.32  DLCO uncorrected ml/min/mmHg 20.43  DLCO UNC% % 98  DLCO corrected ml/min/mmHg 20.43  DLCO COR %Predicted % 98  DLVA Predicted % 97  TLC L 5.89  TLC % Predicted % 110  RV % Predicted % 119    No results found for: NITRICOXIDE      Assessment & Plan:   Asthma, chronic Mild persistent asthma with allergic phenotype  PFT show reversible mild airflow obstruction elevated FENO at 44ppb  And elevated eosinophils  Would continue on Symbicort [place] /xyzal   Plan  Patient Instructions  Continue on Symbicort 2 puffs Twice daily  ,use with spacer.  Continue on Singulair 10mg  daily  Continue on Xyzal 5mg  1/2 At bedtime   Albuterol inhaler As needed   Activity as tolerated.   Begin CPAP At bedtime   Work on healthy weight loss.  Wear CPAP each night all night .  Do not drive if sleepy  Follow up with Dr. Verlee Monte or Surina Storts NP in 3 months and As needed         OSA (obstructive sleep apnea) Mild OSA -went over treatment options with patient education  - discussed how weight can impact sleep and risk for sleep disordered breathing - discussed options to assist with weight loss: combination of diet modification, cardiovascular and strength training exercises   - had an extensive discussion regarding the adverse health consequences related to untreated sleep disordered breathing - specifically discussed  the risks for hypertension, coronary artery disease, cardiac dysrhythmias, cerebrovascular disease, and diabetes - lifestyle modification discussed   - discussed how sleep disruption can increase risk of accidents, particularly when driving - safe driving  practices were discussed   -wants to begin CPAP -CPAP 5-15 cm H2O .    Plan  Patient Instructions  Continue on Symbicort 2 puffs Twice daily  ,use with spacer.  Continue on Singulair 10mg  daily  Continue on Xyzal 5mg  1/2 At bedtime   Albuterol inhaler As needed   Activity as tolerated.   Begin CPAP At bedtime   Work on healthy weight loss.  Wear CPAP each night all night .  Do not drive if sleepy  Follow up with Dr. Verlee Monte or Turrell Severt NP in 3 months and As needed            Rexene Edison, NP 07/28/2021

## 2021-08-10 ENCOUNTER — Other Ambulatory Visit: Payer: Self-pay | Admitting: Adult Health

## 2021-08-10 ENCOUNTER — Other Ambulatory Visit: Payer: Self-pay

## 2021-08-10 DIAGNOSIS — I10 Essential (primary) hypertension: Secondary | ICD-10-CM

## 2021-08-10 DIAGNOSIS — Z0189 Encounter for other specified special examinations: Secondary | ICD-10-CM

## 2021-08-10 DIAGNOSIS — Z79899 Other long term (current) drug therapy: Secondary | ICD-10-CM

## 2021-08-10 DIAGNOSIS — R918 Other nonspecific abnormal finding of lung field: Secondary | ICD-10-CM

## 2021-08-10 DIAGNOSIS — I77819 Aortic ectasia, unspecified site: Secondary | ICD-10-CM

## 2021-08-10 DIAGNOSIS — I480 Paroxysmal atrial fibrillation: Secondary | ICD-10-CM

## 2021-08-10 DIAGNOSIS — I712 Thoracic aortic aneurysm, without rupture, unspecified: Secondary | ICD-10-CM

## 2021-08-10 DIAGNOSIS — E785 Hyperlipidemia, unspecified: Secondary | ICD-10-CM

## 2021-08-10 MED ORDER — FLECAINIDE ACETATE 50 MG PO TABS: 50.0000 mg | ORAL_TABLET | Freq: Two times a day (BID) | ORAL | 9 refills | Status: DC

## 2021-08-10 MED ORDER — ROSUVASTATIN CALCIUM 10 MG PO TABS: 10.0000 mg | ORAL_TABLET | Freq: Every day | ORAL | 2 refills | Status: DC

## 2021-08-11 ENCOUNTER — Telehealth: Payer: Self-pay | Admitting: Pharmacist

## 2021-08-11 NOTE — Chronic Care Management (AMB) (Signed)
? ? ?Chronic Care Management ?Pharmacy Assistant  ? ?Name: Glenda Bauer  MRN: [id number] DOB: 1951/02/14 ? ?Reason for Encounter: Medication Review Medication Coordination  ?  ?Conditions to be addressed/monitored: ?HTN ? ?Recent office visits:  ?None ? ?Recent consult visits:  ?07/28/21 Parrett, Fonnie Mu, NP (Pulmonology) - Patient presented for moderate persistent chronic asthma without complication. No medication changes. ? ?Hospital visits:  ?None in previous 6 months ? ?Medications: ?Outpatient Encounter Medications as of 08/11/2021  ?Medication Sig  ? albuterol (PROVENTIL) (2.5 MG/3ML) 0.083% nebulizer solution INHALE THE contents of ONE vial via NEBULIZER EVERY 6 HOURS AS NEEDED FOR WHEEZING OR FOR SHORTNESS OF BREATH  ? albuterol (VENTOLIN HFA) 108 (90 Base) MCG/ACT inhaler Inhale 2 puffs into the lungs every 6 (six) hours as needed for wheezing or shortness of breath.  ? ALPRAZolam (XANAX) 0.25 MG tablet TAKE 1 TO 2 TABLETS BY MOUTH ONCE daily AS NEEDED  ? budesonide-formoterol (SYMBICORT) 160-4.5 MCG/ACT inhaler Inhale 2 puffs into the lungs 2 (two) times daily.  ? famotidine (PEPCID) 20 MG tablet Take 20 mg by mouth daily.  ? flecainide (TAMBOCOR) 50 MG tablet Take 1 tablet (50 mg total) by mouth 2 (two) times daily.  ? FLUoxetine (PROZAC) 20 MG capsule TAKE 1 CAPSULE(20 MG) BY MOUTH DAILY  ? fluticasone (FLONASE) 50 MCG/ACT nasal spray Place 2 sprays into both nostrils as needed.  ? levocetirizine (XYZAL) 5 MG tablet Take 5 mg by mouth as needed for allergies.  ? lisinopril-hydrochlorothiazide (ZESTORETIC) 20-12.5 MG tablet Take 2 tablets by mouth daily.  ? metoprolol succinate (TOPROL-XL) 25 MG 24 hr tablet Take 1 tablet (25 mg total) by mouth daily. Take with or immediately following a meal.  ? montelukast (SINGULAIR) 10 MG tablet Take 1 tablet (10 mg total) by mouth at bedtime.  ? Multiple Vitamin (MULTIVITAMIN) tablet Take 1 tablet by mouth daily.  ? Multiple Vitamins-Minerals (VISION FORMULA PO)  Take by mouth daily.   ? Omega-3 Fatty Acids (FISH OIL PO) Take 1 capsule by mouth daily.  ? Probiotic Product (ALIGN) 4 MG CAPS Take by mouth daily.  ? rivaroxaban (XARELTO) 20 MG TABS tablet TAKE 1 TABLET(20 MG) BY MOUTH DAILY WITH SUPPER  ? rosuvastatin (CRESTOR) 10 MG tablet Take 1 tablet (10 mg total) by mouth daily.  ? triamcinolone cream (KENALOG) 0.1 % APPLY TOPICALLY TWICE DAILY  ? ?No facility-administered encounter medications on file as of 08/11/2021.  ?Reviewed chart for medication changes ahead of medication coordination call. ? ?No OVs, Consults, or hospital visits since last care coordination call/Pharmacist visit ? ?No medication changes indicated . ? ?BP Readings from Last 3 Encounters:  ?07/28/21 106/66  ?07/01/21 108/66  ?06/15/21 100/60  ?  ?Lab Results  ?Component Value Date  ? HGBA1C 5.6 05/08/2021  ? Reviewed chart prior to disease state call. Spoke with patient regarding BP ? ?Recent Office Vitals: ?BP Readings from Last 3 Encounters:  ?07/28/21 106/66  ?07/01/21 108/66  ?06/15/21 100/60  ? ?Pulse Readings from Last 3 Encounters:  ?07/28/21 66  ?07/01/21 60  ?06/15/21 65  ?  ?Wt Readings from Last 3 Encounters:  ?07/28/21 186 lb (84.4 kg)  ?07/01/21 187 lb (84.8 kg)  ?05/25/21 187 lb (84.8 kg)  ?  ? ?Kidney Function ?Lab Results  ?Component Value Date/Time  ? CREATININE 0.94 05/25/2021 09:46 AM  ? CREATININE 0.92 05/08/2021 09:34 AM  ? CREATININE 0.89 03/27/2020 09:17 AM  ? GFR 62.30 05/08/2021 09:34 AM  ? GFRNONAA 59 (L) 05/21/2020 11:21 AM  ?  GFRNONAA >60 04/17/2020 12:30 PM  ? GFRNONAA 65 03/27/2020 09:17 AM  ? GFRAA 68 05/21/2020 11:21 AM  ? GFRAA 76 03/27/2020 09:17 AM  ? ? ?BMP Latest Ref Rng & Units 05/25/2021 05/08/2021 11/12/2020  ?Glucose 70 - 99 mg/dL 107(H) 99 122(H)  ?BUN 8 - 27 mg/dL 28(H) 20 16  ?Creatinine 0.57 - 1.00 mg/dL 0.94 0.92 0.92  ?BUN/Creat Ratio 12 - 28 30(H) - 17  ?Sodium 134 - 144 mmol/L 142 140 141  ?Potassium 3.5 - 5.2 mmol/L 3.7 4.0 3.6  ?Chloride 96 - 106 mmol/L  103 104 104  ?CO2 20 - 29 mmol/L '23 28 21  '$ ?Calcium 8.7 - 10.3 mg/dL 9.5 10.1 8.8  ? ? ?Current antihypertensive regimen:  ?Lisinopril - HCTZ 20-12.5 1.5 tablets daily (rx said 2 tablets daily) - every 2 hours going to the bathroom ?Metoprolol succinate 50 mg 1/2 tablet daily ?How often are you checking your Blood Pressure? infrequently ?Current home BP readings:  ?BP Readings from Last 3 Encounters:  ?07/28/21 106/66  ?07/01/21 108/66  ?06/15/21 100/60  ? ?What recent interventions/DTPs have been made by any provider to improve Blood Pressure control since last CPP Visit: Patient reports none ?Any recent hospitalizations or ED visits since last visit with CPP? No ?Patient reports her pressures have been remaining in the above ranges she denies any hypotensive symptoms and says she has been in communication with her Pulmonologist about the range of her readings. ?Adherence Review: ?Is the patient currently on ACE/ARB medication? Yes ?Does the patient have >5 day gap between last estimated fill dates? No ? ? ? ? ?Patient obtains medications through Adherence Packaging  30 Days  ? ?Last adherence delivery included:  ?Rosuvastatin (Crestor) 10 mg - Take one tablet at Breakfast ?Flecainide (Tambocor) 50 mg - Take one tablet at Breakfast and one tablet at Bedtime ?Rivaroxaban (Xarelto) 20 mg - Take one tablet at Evening Meal ?Montelukast (Singulair) 10 mg - Take one tablet at Bedtime ?Metropolol Succinate (Toprolol-XL) 25 mg - Take one tablet at Breakfast ?Lisinopril-HCTZ (Zestoric) 20-12.5 mg - Take two tablets at Breakfast ?Fluoxetine (Prozac) 20 mg - Take one capsule at Breakfast ?Alprazolam (Xanax) - Take 1-2 tablets per day as needed ?Albuterol HFA inhaler - use as needed ?  ?Patient declined the following medications due to on hand supply ?Albuterol 2.5 mg - inhale contents of 1 vial every 6 hours via nebulizer as needed ?  ?Added: ?Albuterol Inhaler ? ?Patient is due for next adherence delivery on:  08/20/21. ?Called patient and reviewed medications and coordinated delivery. ?Confirmed Packs for 30 DS ? ?This delivery to include: ?Rosuvastatin (Crestor) 10 mg - Take one tablet at Breakfast ?Flecainide (Tambocor) 50 mg - Take one tablet at Breakfast and one tablet at Bedtime ?Rivaroxaban (Xarelto) 20 mg - Take one tablet at Evening Meal ?Montelukast (Singulair) 10 mg - Take one tablet at Bedtime ?Metropolol Succinate (Toprolol-XL) 25 mg - Take one tablet at Breakfast ?Lisinopril-HCTZ (Zestoric) 20-12.5 mg - Take two tablets at Breakfast ?Fluoxetine (Prozac) 20 mg - Take one capsule at Breakfast ?Albuterol HFA inhaler - use as needed ?Alprazolam (Xanax) - Take 1-2 tablets per day as needed ? ?Confirmed delivery date of 08/20/21, advised patient that pharmacy will contact them the morning of delivery.  ? ?Care Gaps: ?TDAP-Overdue ?Zoster Vaccine - Overdue ?PNA Vaccine - Overdue ?COVID Booster - Overdue ?BP- 106/66 ( 07/28/21) ?AWV- 4/22 ?CCM- 8/23 ? ?Star Rating Drugs: ?Rosuvastatin 10 MG - Last filled 07/15/21 30 DS at [facility] ?  Lisinopril- HCTZ 20/12.5 mg - Last filled 07/15/21 30 DS at [facility] ?  ? ?Ned Clines CMA ?Clinical Pharmacist Assistant ?[phone] ? ?

## 2021-08-17 DIAGNOSIS — G4733 Obstructive sleep apnea (adult) (pediatric): Secondary | ICD-10-CM | POA: Diagnosis not present

## 2021-09-07 ENCOUNTER — Other Ambulatory Visit: Payer: Self-pay | Admitting: Adult Health

## 2021-09-07 DIAGNOSIS — F419 Anxiety disorder, unspecified: Secondary | ICD-10-CM

## 2021-09-07 DIAGNOSIS — F32A Depression, unspecified: Secondary | ICD-10-CM

## 2021-09-08 ENCOUNTER — Telehealth: Payer: Self-pay | Admitting: Pharmacist

## 2021-09-08 NOTE — Chronic Care Management (AMB) (Signed)
? ? ?Chronic Care Management ?Pharmacy Assistant  ? ?Name: Glenda Bauer  MRN: [id number] DOB: 1950/12/31 ? ?Reason for Encounter: Medication Review Medication Coordination ?  ?Conditions to be addressed/monitored: ?HTN ? ?Recent office visits:  ?None ? ?Recent consult visits:  ?None ? ?Hospital visits:  ?None in previous 6 months ? ?Medications: ?Outpatient Encounter Medications as of 09/08/2021  ?Medication Sig  ? albuterol (PROVENTIL) (2.5 MG/3ML) 0.083% nebulizer solution INHALE THE contents of ONE vial via NEBULIZER EVERY 6 HOURS AS NEEDED FOR WHEEZING OR FOR SHORTNESS OF BREATH  ? albuterol (VENTOLIN HFA) 108 (90 Base) MCG/ACT inhaler INHALE TWO PUFFS BY MOUTH INTO LUNGS EVERY 6 HOURS AS NEEDED FOR SHORTNESS OF BREATH OR wheezing  ? ALPRAZolam (XANAX) 0.25 MG tablet TAKE 1 TO 2 TABLETS BY MOUTH ONCE daily AS NEEDED  ? budesonide-formoterol (SYMBICORT) 160-4.5 MCG/ACT inhaler Inhale 2 puffs into the lungs 2 (two) times daily.  ? famotidine (PEPCID) 20 MG tablet Take 20 mg by mouth daily.  ? flecainide (TAMBOCOR) 50 MG tablet Take 1 tablet (50 mg total) by mouth 2 (two) times daily.  ? FLUoxetine (PROZAC) 20 MG capsule TAKE 1 CAPSULE(20 MG) BY MOUTH DAILY  ? fluticasone (FLONASE) 50 MCG/ACT nasal spray Place 2 sprays into both nostrils as needed.  ? levocetirizine (XYZAL) 5 MG tablet Take 5 mg by mouth as needed for allergies.  ? lisinopril-hydrochlorothiazide (ZESTORETIC) 20-12.5 MG tablet Take 2 tablets by mouth daily.  ? metoprolol succinate (TOPROL-XL) 25 MG 24 hr tablet Take 1 tablet (25 mg total) by mouth daily. Take with or immediately following a meal.  ? montelukast (SINGULAIR) 10 MG tablet Take 1 tablet (10 mg total) by mouth at bedtime.  ? Multiple Vitamin (MULTIVITAMIN) tablet Take 1 tablet by mouth daily.  ? Multiple Vitamins-Minerals (VISION FORMULA PO) Take by mouth daily.   ? Omega-3 Fatty Acids (FISH OIL PO) Take 1 capsule by mouth daily.  ? Probiotic Product (ALIGN) 4 MG CAPS Take by mouth  daily.  ? rivaroxaban (XARELTO) 20 MG TABS tablet TAKE 1 TABLET(20 MG) BY MOUTH DAILY WITH SUPPER  ? rosuvastatin (CRESTOR) 10 MG tablet Take 1 tablet (10 mg total) by mouth daily.  ? triamcinolone cream (KENALOG) 0.1 % APPLY TOPICALLY TWICE DAILY  ? ?No facility-administered encounter medications on file as of 09/08/2021.  ?Reviewed chart prior to disease state call. Spoke with patient regarding BP ? ?Recent Office Vitals: ?BP Readings from Last 3 Encounters:  ?07/28/21 106/66  ?07/01/21 108/66  ?06/15/21 100/60  ? ?Pulse Readings from Last 3 Encounters:  ?07/28/21 66  ?07/01/21 60  ?06/15/21 65  ?  ?Wt Readings from Last 3 Encounters:  ?07/28/21 186 lb (84.4 kg)  ?07/01/21 187 lb (84.8 kg)  ?05/25/21 187 lb (84.8 kg)  ?  ? ?Kidney Function ?Lab Results  ?Component Value Date/Time  ? CREATININE 0.94 05/25/2021 09:46 AM  ? CREATININE 0.92 05/08/2021 09:34 AM  ? CREATININE 0.89 03/27/2020 09:17 AM  ? GFR 62.30 05/08/2021 09:34 AM  ? GFRNONAA 59 (L) 05/21/2020 11:21 AM  ? GFRNONAA >60 04/17/2020 12:30 PM  ? GFRNONAA 65 03/27/2020 09:17 AM  ? GFRAA 68 05/21/2020 11:21 AM  ? GFRAA 76 03/27/2020 09:17 AM  ? ? ? ?  Latest Ref Rng & Units 05/25/2021  ?  9:46 AM 05/08/2021  ?  9:34 AM 11/12/2020  ? 11:05 AM  ?BMP  ?Glucose 70 - 99 mg/dL 107   99   122    ?BUN 8 - 27 mg/dL 28  20   16    ?Creatinine 0.57 - 1.00 mg/dL 0.94   0.92   0.92    ?BUN/Creat Ratio 12 - '28 30    17    '$ ?Sodium 134 - 144 mmol/L 142   140   141    ?Potassium 3.5 - 5.2 mmol/L 3.7   4.0   3.6    ?Chloride 96 - 106 mmol/L 103   104   104    ?CO2 20 - 29 mmol/L '23   28   21    '$ ?Calcium 8.7 - 10.3 mg/dL 9.5   10.1   8.8    ? ? ?Current antihypertensive regimen:  ?Lisinopril - HCTZ 20-12.5 1.5 tablets daily (rx said 2 tablets daily) - every 2 hours going to the bathroom ?Metoprolol succinate 25 mg 1 tablet daily ?How often are you checking your Blood Pressure? infrequently ?Current home BP readings:  ?BP Readings from Last 3 Encounters:  ?07/28/21 106/66   ?07/01/21 108/66  ?06/15/21 100/60  ? ?What recent interventions/DTPs have been made by any provider to improve Blood Pressure control since last CPP Visit: None  ? ?Reviewed chart for medication changes ahead of medication coordination call. ? ?No OVs, Consults, or hospital visits since last care coordination call/Pharmacist visit.  ? ?No medication changes indicated  ?BP Readings from Last 3 Encounters:  ?07/28/21 106/66  ?07/01/21 108/66  ?06/15/21 100/60  ?  ?Lab Results  ?Component Value Date  ? HGBA1C 5.6 05/08/2021  ?  ? ?Patient obtains medications through Adherence Packaging  30 Days  ? ?Last adherence delivery included:  ?Rosuvastatin (Crestor) 10 mg - Take one tablet at Breakfast ?Flecainide (Tambocor) 50 mg - Take one tablet at Breakfast and one tablet at Bedtime ?Rivaroxaban (Xarelto) 20 mg - Take one tablet at Evening Meal ?Montelukast (Singulair) 10 mg - Take one tablet at Bedtime ?Metropolol Succinate (Toprolol-XL) 25 mg - Take one tablet at Breakfast ?Lisinopril-HCTZ (Zestoric) 20-12.5 mg - Take two tablets at Breakfast ?Fluoxetine (Prozac) 20 mg - Take one capsule at Breakfast ?Albuterol HFA inhaler - use as needed ?Alprazolam (Xanax) - Take 1-2 tablets per day as needed ?  ?Patient is due for next adherence delivery on: 09/18/21. ?Called patient and reviewed medications and coordinated delivery. ?Packs for 30 DS ? ?This delivery to include: ?Flecainide (Tambocor) 50 mg - Take one tablet at Breakfast and one tablet at Bedtime ?Rosuvastatin (Crestor) 10 mg - Take one tablet at Breakfast ?Montelukast (Singulair) 10 mg - Take one tablet at Bedtime ?Rivaroxaban (Xarelto) 20 mg - Take one tablet at Evening Meal ?Metropolol Succinate (Toprolol-XL) 25 mg - Take one tablet at Breakfast ?Fluoxetine (Prozac) 20 mg - Take one capsule at Breakfast ?Lisinopril-HCTZ (Zestoric) 20-12.5 mg - Take two tablets at Breakfast ?Alprazolam (Xanax) - Take 1-2 tablets per day as needed ? ? ? ?Patient declined the following  medications (meds) due to (reason) ?Albuterol HFA inhaler - use as needed ? ? ?Confirmed delivery date of 09/18/21, advised patient that pharmacy will contact them the morning of delivery.  ? ?Care Gaps: ?TDAP - Overdue ?Zoster Vaccine - Overdue ?PNA Vaccine - Overdue ?COVID Booster - Overdue ?Mammogram - Overdue ?BP- 106/66 ( 07/28/21) ?AWV- 4/22 ?CCM- 8/23 ? ?Star Rating Drugs: ?Rosuvastatin 10 MG - Last filled 08/14/21 30 DS at [facility] ?Lisinopril- HCTZ 20/12.5 mg - Last filled 08/14/21 30 DS at [facility] ? ?Ned Clines CMA ?Clinical Pharmacist Assistant ?[phone] ? ?

## 2021-09-09 NOTE — Telephone Encounter (Signed)
Okay for refill?  ? ? ?LOV 05/08/2022 CPE ? ?ALPRAZolam (XANAX) 0.25 MG tablet 30 tablet 2 06/09/2021   ?Sig:   TAKE 1 TO 2 TABLETS BY MOUTH ONCE daily AS NEEDED      ? ? ? ?

## 2021-09-17 DIAGNOSIS — G4733 Obstructive sleep apnea (adult) (pediatric): Secondary | ICD-10-CM | POA: Diagnosis not present

## 2021-09-24 ENCOUNTER — Telehealth: Payer: Self-pay | Admitting: Adult Health

## 2021-09-24 NOTE — Telephone Encounter (Signed)
Left message for patient to call back and schedule Medicare Annual Wellness Visit (AWV) either virtually or in office. Left  my jabber number [phone] ? ? ?Last AWV ;09/29/20 ?please schedule at anytime with [facility] Nurse Health Advisor 1 or 2 ? ? ? ?

## 2021-10-08 ENCOUNTER — Ambulatory Visit (INDEPENDENT_AMBULATORY_CARE_PROVIDER_SITE_OTHER): Payer: Medicare Other

## 2021-10-08 ENCOUNTER — Telehealth: Payer: Self-pay | Admitting: Pharmacist

## 2021-10-08 VITALS — Ht 66.0 in | Wt 186.0 lb

## 2021-10-08 DIAGNOSIS — Z0000 Encounter for general adult medical examination without abnormal findings: Secondary | ICD-10-CM | POA: Diagnosis not present

## 2021-10-08 NOTE — Progress Notes (Signed)
? ?Subjective:  ? Glenda Bauer is a 71 y.o. female who presents for Medicare Annual (Subsequent) preventive examination. ? ?Review of Systems    ?Virtual Visit via Telephone Note ? ?I connected with  [name] on 10/08/21 at 10:00 AM EDT by telephone and verified that I am speaking with the correct person using two identifiers. ? ?Location: ?Patient: Home ?Provider: Office ?Persons participating in the virtual visit: patient/Nurse Health Advisor ?  ?I discussed the limitations, risks, security and privacy concerns of performing an evaluation and management service by telephone and the availability of in person appointments. The patient expressed understanding and agreed to proceed. ? ?Interactive audio and video telecommunications were attempted between this nurse and patient, however failed, due to patient having technical difficulties OR patient did not have access to video capability.  We continued and completed visit with audio only. ? ?Some vital signs may be absent or patient reported.  ? ?[name], LPN  ?Cardiac Risk Factors include: hypertension ? ?   ?Objective:  ?  ?Today's Vitals  ? 10/08/21 0959  ?Weight: 186 lb (84.4 kg)  ?Height: '5\' 6"'$  (1.676 m)  ? ?Body mass index is 30.02 kg/m?. ? ? ?  10/08/2021  ? 10:08 AM 11/14/2014  ?  8:52 AM  ?Advanced Directives  ?Does Patient Have a Medical Advance Directive? No No  ?Would patient like information on creating a medical advance directive? No - Patient declined   ? ? ?Current Medications (verified) ?Outpatient Encounter Medications as of 10/08/2021  ?Medication Sig  ? albuterol (PROVENTIL) (2.5 MG/3ML) 0.083% nebulizer solution INHALE THE contents of ONE vial via NEBULIZER EVERY 6 HOURS AS NEEDED FOR WHEEZING OR FOR SHORTNESS OF BREATH  ? albuterol (VENTOLIN HFA) 108 (90 Base) MCG/ACT inhaler INHALE TWO PUFFS BY MOUTH INTO LUNGS EVERY 6 HOURS AS NEEDED FOR SHORTNESS OF BREATH OR wheezing  ? ALPRAZolam (XANAX) 0.25 MG tablet TAKE 1 TO 2  TABLETS BY MOUTH ONCE daily AS NEEDED  ? budesonide-formoterol (SYMBICORT) 160-4.5 MCG/ACT inhaler Inhale 2 puffs into the lungs 2 (two) times daily.  ? famotidine (PEPCID) 20 MG tablet Take 20 mg by mouth daily.  ? flecainide (TAMBOCOR) 50 MG tablet Take 1 tablet (50 mg total) by mouth 2 (two) times daily.  ? FLUoxetine (PROZAC) 20 MG capsule TAKE 1 CAPSULE(20 MG) BY MOUTH DAILY  ? fluticasone (FLONASE) 50 MCG/ACT nasal spray Place 2 sprays into both nostrils as needed.  ? levocetirizine (XYZAL) 5 MG tablet Take 5 mg by mouth as needed for allergies.  ? lisinopril-hydrochlorothiazide (ZESTORETIC) 20-12.5 MG tablet Take 2 tablets by mouth daily.  ? metoprolol succinate (TOPROL-XL) 25 MG 24 hr tablet Take 1 tablet (25 mg total) by mouth daily. Take with or immediately following a meal.  ? montelukast (SINGULAIR) 10 MG tablet Take 1 tablet (10 mg total) by mouth at bedtime.  ? Multiple Vitamin (MULTIVITAMIN) tablet Take 1 tablet by mouth daily.  ? Multiple Vitamins-Minerals (VISION FORMULA PO) Take by mouth daily.   ? Omega-3 Fatty Acids (FISH OIL PO) Take 1 capsule by mouth daily.  ? Probiotic Product (ALIGN) 4 MG CAPS Take by mouth daily.  ? rivaroxaban (XARELTO) 20 MG TABS tablet TAKE 1 TABLET(20 MG) BY MOUTH DAILY WITH SUPPER  ? rosuvastatin (CRESTOR) 10 MG tablet Take 1 tablet (10 mg total) by mouth daily.  ? triamcinolone cream (KENALOG) 0.1 % APPLY TOPICALLY TWICE DAILY  ? ?No facility-administered encounter medications on file as of 10/08/2021.  ? ? ?Allergies (verified) ?  Patient has no known allergies.  ? ?History: ?Past Medical History:  ?Diagnosis Date  ? Allergy   ? Anxiety   ? Asthma   ? Atrial fibrillation ([place])   ? Cancer [facility])   ? Melanoma and Mycosis Fungosis  ? Cataract   ? 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 Dermatologist treated in [place]; Dr. Hipolito Bayley  ? Depression   ? GERD (gastroesophageal reflux disease)   ? Heart murmur   ? Hypertension   ? Neuromuscular disorder ([place])   ?  Osteoarthritis   ? Sebaceous cyst 2022  ? right vulva  ? Thyroid nodule   ? Urinary tract infection   ? ?Past Surgical History:  ?Procedure Laterality Date  ? Cataracts Bilateral   ? CHOLECYSTECTOMY  2015  ? melenoma removal  1979  ? Sinus Polyps     ? ?Family History  ?Problem Relation Age of Onset  ? Arthritis Mother   ? Ovarian cancer Mother   ?     Metastatic  ? Breast cancer Mother 43  ? Thyroid disease Mother   ? Parkinson's disease Father   ? Crohn's disease Brother   ? Schizophrenia Brother   ? Colon cancer Neg Hx   ? Esophageal cancer Neg Hx   ? Rectal cancer Neg Hx   ? Stomach cancer Neg Hx   ? ?Social History  ? ?Socioeconomic History  ? Marital status: Divorced  ?  Spouse name: Not on file  ? Number of children: 3  ? Years of education: 29  ? Highest education level: Associate degree: academic program  ?Occupational History  ? Occupation: Clerical   ?Tobacco Use  ? Smoking status: Former  ?  Packs/day: 0.25  ?  Years: 10.00  ?  Pack years: 2.50  ?  Types: Cigarettes  ?  Quit date: 06/07/1981  ?  Years since quitting: 40.3  ? Smokeless tobacco: Never  ?Vaping Use  ? Vaping Use: Never used  ?Substance and Sexual Activity  ? Alcohol use: No  ?  Alcohol/week: 0.0 standard drinks  ? Drug use: No  ? Sexual activity: Not Currently  ?  Birth control/protection: Post-menopausal  ?Other Topics Concern  ? Not on file  ?Social History Narrative  ? Separated   ? Three children ( one lives locally)   ? ?Social Determinants of Health  ? ?Financial Resource Strain: Low Risk   ? Difficulty of Paying Living Expenses: Not very hard  ?Food Insecurity: No Food Insecurity  ? Worried About Charity fundraiser in the Last Year: Never true  ? Ran Out of Food in the Last Year: Never true  ?Transportation Needs: No Transportation Needs  ? Lack of Transportation (Medical): No  ? Lack of Transportation (Non-Medical): No  ?Physical Activity: Insufficiently Active  ? Days of Exercise per Week: 3 days  ? Minutes of Exercise per Session:  20 min  ?Stress: Stress Concern Present  ? Feeling of Stress : To some extent  ?Social Connections: Socially Isolated  ? Frequency of Communication with Friends and Family: More than three times a week  ? Frequency of Social Gatherings with Friends and Family: More than three times a week  ? Attends Religious Services: Never  ? Active Member of Clubs or Organizations: No  ? Attends Archivist Meetings: Never  ? Marital Status: Divorced  ? ? ?Tobacco Counseling ?Counseling given: Not Answered ? ? ?Clinical Intake: ? ?Pre-visit preparation completed: Yes ? ?Pain : No/denies pain ? ?  ? ?  BMI - recorded: 30.04 ?Nutritional Status: BMI > 30  Obese ?Nutritional Risks: None ?Diabetes: No ? ?How often do you need to have someone help you when you read instructions, pamphlets, or other written materials from your doctor or pharmacy?: 1 - Never ? ?Diabetic?  No ? ? ?Activities of Daily Living ? ?  10/08/2021  ? 10:06 AM 10/08/2021  ?  9:55 AM  ?In your present state of health, do you have any difficulty performing the following activities:  ?Hearing? 0 0  ?Vision? 0 0  ?Difficulty concentrating or making decisions? 0 0  ?Walking or climbing stairs? 0 0  ?Dressing or bathing? 0 0  ?Doing errands, shopping? 0 0  ?Preparing Food and eating ? N N  ?Using the Toilet? N N  ?In the past six months, have you accidently leaked urine? Y Y  ?Comment Wears pads.   ?Do you have problems with loss of bowel control? N N  ?Managing your Medications? N N  ?Managing your Finances? N N  ?Housekeeping or managing your Housekeeping? N N  ? ? ?Patient Care Team: ?[name], NP as PCP - General (Family Medicine) ?[name], MD as PCP - Cardiology (Cardiology) ?Viona Gilmore, [facility] as Pharmacist (Pharmacist) ? ?Indicate any recent Medical Services you may have received from other than [facility] providers in the past year (date may be approximate). ? ?   ?Assessment:  ? This is a routine wellness examination for  S[place]. ? ?Hearing/Vision screen ?Hearing Screening - Comments:: No hearing difficulty ?Vision Screening - Comments:: No vision difficulty. Followed by [facility] ? ?Dietary issues and exercise activities discussed: ?Exercise lim

## 2021-10-08 NOTE — Patient Instructions (Addendum)
?[name] , ?Thank you for taking time to come for your Medicare Wellness Visit. I appreciate your ongoing commitment to your health goals. Please review the following plan we discussed and let me know if I can assist you in the future.  ? ?These are the goals we discussed: ? Goals   ? ?   Exercise 3x per week (30 min per time)   ?   Manage My Medicine   ?   Timeframe:  Short-Term Goal ?Priority:  High ?Start Date:                             ?Expected End Date:                      ? ?Follow Up Date 03/02/2021  ?  ?- keep a list of all the medicines I take; vitamins and herbals too ?- use an alarm clock or phone to remind me to take my medicine  ?  ?Why is this important?   ?These steps will help you keep on track with your medicines. ?  ?Notes:  ?  ?   Weight < 160 lb (72.6 kg) (pt-stated)   ?   Goal is to start walking again up to 3 miles. ? ?  ? ?  ?  ?This is a list of the screening recommended for you and due dates:  ?Health Maintenance  ?Topic Date Due  ? COVID-19 Vaccine (4 - Booster for Pfizer series) 10/24/2021*  ? Zoster (Shingles) Vaccine (1 of 2) 01/08/2022*  ? Pneumonia Vaccine (1 - PCV) 10/09/2022*  ? Mammogram  10/09/2022*  ? Tetanus Vaccine  10/09/2022*  ? Flu Shot  01/05/2022  ? DEXA scan (bone density measurement)  Completed  ? Hepatitis C Screening: USPSTF Recommendation to screen - Ages 59-79 yo.  Completed  ? HPV Vaccine  Aged Out  ? Colon Cancer Screening  Discontinued  ?*Topic was postponed. The date shown is not the original due date.  ? ?Advanced directives: No  ? ?Conditions/risks identified: None ? ?Next appointment: Follow up in one year for your annual wellness visit  ? ? ? ?Preventive Care 73 Years and Older, Female ?Preventive care refers to lifestyle choices and visits with your health care provider that can promote health and wellness. ?What does preventive care include? ?A yearly physical exam. This is also called an annual well check. ?Dental exams once or twice a  year. ?Routine eye exams. Ask your health care provider how often you should have your eyes checked. ?Personal lifestyle choices, including: ?Daily care of your teeth and gums. ?Regular physical activity. ?Eating a healthy diet. ?Avoiding tobacco and drug use. ?Limiting alcohol use. ?Practicing safe sex. ?Taking low-dose aspirin every day. ?Taking vitamin and mineral supplements as recommended by your health care provider. ?What happens during an annual well check? ?The services and screenings done by your health care provider during your annual well check will depend on your age, overall health, lifestyle risk factors, and family history of disease. ?Counseling  ?Your health care provider may ask you questions about your: ?Alcohol use. ?Tobacco use. ?Drug use. ?Emotional well-being. ?Home and relationship well-being. ?Sexual activity. ?Eating habits. ?History of falls. ?Memory and ability to understand (cognition). ?Work and work Statistician. ?Reproductive health. ?Screening  ?You may have the following tests or measurements: ?Height, weight, and BMI. ?Blood pressure. ?Lipid and cholesterol levels. These may be checked every 5 years, or  more frequently if you are over 73 years old. ?Skin check. ?Lung cancer screening. You may have this screening every year starting at age 48 if you have a 30-pack-year history of smoking and currently smoke or have quit within the past 15 years. ?Fecal occult blood test (FOBT) of the stool. You may have this test every year starting at age 50. ?Flexible sigmoidoscopy or colonoscopy. You may have a sigmoidoscopy every 5 years or a colonoscopy every 10 years starting at age 73. ?Hepatitis C blood test. ?Hepatitis B blood test. ?Sexually transmitted disease (STD) testing. ?Diabetes screening. This is done by checking your blood sugar (glucose) after you have not eaten for a while (fasting). You may have this done every 1-3 years. ?Bone density scan. This is done to screen for  osteoporosis. You may have this done starting at age 73. ?Mammogram. This may be done every 1-2 years. Talk to your health care provider about how often you should have regular mammograms. ?Talk with your health care provider about your test results, treatment options, and if necessary, the need for more tests. ?Vaccines  ?Your health care provider may recommend certain vaccines, such as: ?Influenza vaccine. This is recommended every year. ?Tetanus, diphtheria, and acellular pertussis (Tdap, Td) vaccine. You may need a Td booster every 10 years. ?Zoster vaccine. You may need this after age 73. ?Pneumococcal 13-valent conjugate (PCV13) vaccine. One dose is recommended after age 73. ?Pneumococcal polysaccharide (PPSV23) vaccine. One dose is recommended after age 73. ?Talk to your health care provider about which screenings and vaccines you need and how often you need them. ?This information is not intended to replace advice given to you by your health care provider. Make sure you discuss any questions you have with your health care provider. ?Document Released: 06/20/2015 Document Revised: 02/11/2016 Document Reviewed: 03/25/2015 ?Elsevier Interactive Patient Education ? 2017 [place]. ? ?Fall Prevention in the Home ?Falls can cause injuries. They can happen to people of all ages. There are many things you can do to make your home safe and to help prevent falls. ?What can I do on the outside of my home? ?Regularly fix the edges of walkways and driveways and fix any cracks. ?Remove anything that might make you trip as you walk through a door, such as a raised step or threshold. ?Trim any bushes or trees on the path to your home. ?Use bright outdoor lighting. ?Clear any walking paths of anything that might make someone trip, such as rocks or tools. ?Regularly check to see if handrails are loose or broken. Make sure that both sides of any steps have handrails. ?Any raised decks and porches should have guardrails on  the edges. ?Have any leaves, snow, or ice cleared regularly. ?Use sand or salt on walking paths during winter. ?Clean up any spills in your garage right away. This includes oil or grease spills. ?What can I do in the bathroom? ?Use night lights. ?Install grab bars by the toilet and in the tub and shower. Do not use towel bars as grab bars. ?Use non-skid mats or decals in the tub or shower. ?If you need to sit down in the shower, use a plastic, non-slip stool. ?Keep the floor dry. Clean up any water that spills on the floor as soon as it happens. ?Remove soap buildup in the tub or shower regularly. ?Attach bath mats securely with double-sided non-slip rug tape. ?Do not have throw rugs and other things on the floor that can make you trip. ?What  can I do in the bedroom? ?Use night lights. ?Make sure that you have a light by your bed that is easy to reach. ?Do not use any sheets or blankets that are too big for your bed. They should not hang down onto the floor. ?Have a firm chair that has side arms. You can use this for support while you get dressed. ?Do not have throw rugs and other things on the floor that can make you trip. ?What can I do in the kitchen? ?Clean up any spills right away. ?Avoid walking on wet floors. ?Keep items that you use a lot in easy-to-reach places. ?If you need to reach something above you, use a strong step stool that has a grab bar. ?Keep electrical cords out of the way. ?Do not use floor polish or wax that makes floors slippery. If you must use wax, use non-skid floor wax. ?Do not have throw rugs and other things on the floor that can make you trip. ?What can I do with my stairs? ?Do not leave any items on the stairs. ?Make sure that there are handrails on both sides of the stairs and use them. Fix handrails that are broken or loose. Make sure that handrails are as long as the stairways. ?Check any carpeting to make sure that it is firmly attached to the stairs. Fix any carpet that is loose  or worn. ?Avoid having throw rugs at the top or bottom of the stairs. If you do have throw rugs, attach them to the floor with carpet tape. ?Make sure that you have a light switch at the top of the stairs and the bott

## 2021-10-08 NOTE — Chronic Care Management (AMB) (Signed)
? ? ?Chronic Care Management ?Pharmacy Assistant  ? ?Name: Glenda Bauer  MRN: [id number] DOB: Jan 23, 1951 ? ?Reason for Encounter: Medication Review Medication Coordination ?  ?Conditions to be addressed/monitored: ?HTN ? ?Recent office visits:  ?10/08/21 Glenda Peaches, LPN - Patient presented via phone for [facility] Annual Wellness visit. No medication changes. Voiced goal of walking up to 3 miles. ? ?Recent consult visits:  ?None ? ?Hospital visits:  ?None in previous 6 months ? ?Medications: ?Outpatient Encounter Medications as of 10/08/2021  ?Medication Sig  ? albuterol (PROVENTIL) (2.5 MG/3ML) 0.083% nebulizer solution INHALE THE contents of ONE vial via NEBULIZER EVERY 6 HOURS AS NEEDED FOR WHEEZING OR FOR SHORTNESS OF BREATH  ? albuterol (VENTOLIN HFA) 108 (90 Base) MCG/ACT inhaler INHALE TWO PUFFS BY MOUTH INTO LUNGS EVERY 6 HOURS AS NEEDED FOR SHORTNESS OF BREATH OR wheezing  ? ALPRAZolam (XANAX) 0.25 MG tablet TAKE 1 TO 2 TABLETS BY MOUTH ONCE daily AS NEEDED  ? budesonide-formoterol (SYMBICORT) 160-4.5 MCG/ACT inhaler Inhale 2 puffs into the lungs 2 (two) times daily.  ? famotidine (PEPCID) 20 MG tablet Take 20 mg by mouth daily.  ? flecainide (TAMBOCOR) 50 MG tablet Take 1 tablet (50 mg total) by mouth 2 (two) times daily.  ? FLUoxetine (PROZAC) 20 MG capsule TAKE 1 CAPSULE(20 MG) BY MOUTH DAILY  ? fluticasone (FLONASE) 50 MCG/ACT nasal spray Place 2 sprays into both nostrils as needed.  ? levocetirizine (XYZAL) 5 MG tablet Take 5 mg by mouth as needed for allergies.  ? lisinopril-hydrochlorothiazide (ZESTORETIC) 20-12.5 MG tablet Take 2 tablets by mouth daily.  ? metoprolol succinate (TOPROL-XL) 25 MG 24 hr tablet Take 1 tablet (25 mg total) by mouth daily. Take with or immediately following a meal.  ? montelukast (SINGULAIR) 10 MG tablet Take 1 tablet (10 mg total) by mouth at bedtime.  ? Multiple Vitamin (MULTIVITAMIN) tablet Take 1 tablet by mouth daily.  ? Multiple Vitamins-Minerals (VISION  FORMULA PO) Take by mouth daily.   ? Omega-3 Fatty Acids (FISH OIL PO) Take 1 capsule by mouth daily.  ? Probiotic Product (ALIGN) 4 MG CAPS Take by mouth daily.  ? rivaroxaban (XARELTO) 20 MG TABS tablet TAKE 1 TABLET(20 MG) BY MOUTH DAILY WITH SUPPER  ? rosuvastatin (CRESTOR) 10 MG tablet Take 1 tablet (10 mg total) by mouth daily.  ? triamcinolone cream (KENALOG) 0.1 % APPLY TOPICALLY TWICE DAILY  ? ?No facility-administered encounter medications on file as of 10/08/2021.  ?Reviewed chart prior to disease state call. Spoke with patient regarding BP ? ?Recent Office Vitals: ?BP Readings from Last 3 Encounters:  ?07/28/21 106/66  ?07/01/21 108/66  ?06/15/21 100/60  ? ?Pulse Readings from Last 3 Encounters:  ?07/28/21 66  ?07/01/21 60  ?06/15/21 65  ?  ?Wt Readings from Last 3 Encounters:  ?10/08/21 186 lb (84.4 kg)  ?07/28/21 186 lb (84.4 kg)  ?07/01/21 187 lb (84.8 kg)  ?  ? ?Kidney Function ?Lab Results  ?Component Value Date/Time  ? CREATININE 0.94 05/25/2021 09:46 AM  ? CREATININE 0.92 05/08/2021 09:34 AM  ? CREATININE 0.89 03/27/2020 09:17 AM  ? GFR 62.30 05/08/2021 09:34 AM  ? GFRNONAA 59 (L) 05/21/2020 11:21 AM  ? GFRNONAA >60 04/17/2020 12:30 PM  ? GFRNONAA 65 03/27/2020 09:17 AM  ? GFRAA 68 05/21/2020 11:21 AM  ? GFRAA 76 03/27/2020 09:17 AM  ? ? ? ?  Latest Ref Rng & Units 05/25/2021  ?  9:46 AM 05/08/2021  ?  9:34 AM 11/12/2020  ? 11:05  AM  ?BMP  ?Glucose 70 - 99 mg/dL 107   99   122    ?BUN 8 - 27 mg/dL '28   20   16    '$ ?Creatinine 0.57 - 1.00 mg/dL 0.94   0.92   0.92    ?BUN/Creat Ratio 12 - '28 30    17    '$ ?Sodium 134 - 144 mmol/L 142   140   141    ?Potassium 3.5 - 5.2 mmol/L 3.7   4.0   3.6    ?Chloride 96 - 106 mmol/L 103   104   104    ?CO2 20 - 29 mmol/L '23   28   21    '$ ?Calcium 8.7 - 10.3 mg/dL 9.5   10.1   8.8    ? ? ?Current antihypertensive regimen:  ?Lisinopril - HCTZ 20-12.5 1.5 tablets daily  ?Metoprolol succinate 25 mg 1 tablet daily ?How often are you checking your Blood Pressure?  weekly ?Current home BP readings: 120/80 patient reports each time it has been no higher with her machine at home. She denies any hyper/hypotensive symptoms. ?What recent interventions/DTPs have been made by any provider to improve Blood Pressure control since last CPP Visit: Patient reports no changes ?Any recent hospitalizations or ED visits since last visit with CPP? No ?What exercise is being done to improve your Blood Pressure Control?  ?During AWV on today patient voiced goal of walking more ( 3 miles) ? ?Adherence Review: ?Is the patient currently on ACE/ARB medication? Yes ?Does the patient have >5 day gap between last estimated fill dates? No ? ?Reviewed chart for medication changes ahead of medication coordination call. ? ?No OVs, Consults, or hospital visits since last care coordination call/Pharmacist visit. ? ?No medication changes indicated  ?BP Readings from Last 3 Encounters:  ?07/28/21 106/66  ?07/01/21 108/66  ?06/15/21 100/60  ?  ?Lab Results  ?Component Value Date  ? HGBA1C 5.6 05/08/2021  ?  ? ?Patient obtains medications through Adherence Packaging  30 Days  ? ?Last adherence delivery included:  ?Flecainide (Tambocor) 50 mg - Take one tablet at Breakfast and one tablet at Bedtime ?Rosuvastatin (Crestor) 10 mg - Take one tablet at Breakfast ?Montelukast (Singulair) 10 mg - Take one tablet at Bedtime ?Rivaroxaban (Xarelto) 20 mg - Take one tablet at Evening Meal ?Metropolol Succinate (Toprolol-XL) 25 mg - Take one tablet at Breakfast ?Fluoxetine (Prozac) 20 mg - Take one capsule at Breakfast ?Lisinopril-HCTZ (Zestoric) 20-12.5 mg - Take two tablets at Breakfast ?Alprazolam (Xanax) - Take 1-2 tablets per day as needed ?  ? ?Patient declined the following medications (meds) due to (reason) ?Albuterol HFA inhaler - use as needed ?  ? ?Patient is due for next adherence delivery on: 10/20/21. ?Called patient and reviewed medications and coordinated delivery. ?Packs for 30 DS ? ?Flecainide (Tambocor) 50  mg - Take one tablet at Breakfast and one tablet at Bedtime ?Rosuvastatin (Crestor) 10 mg - Take one tablet at Breakfast ?Montelukast (Singulair) 10 mg - Take one tablet at Bedtime ?Rivaroxaban (Xarelto) 20 mg - Take one tablet at Evening Meal ?Metropolol Succinate (Toprolol-XL) 25 mg - Take one tablet at Breakfast ?Fluoxetine (Prozac) 20 mg - Take one capsule at Breakfast ?Lisinopril-HCTZ (Zestoric) 20-12.5 mg - Take two tablets at Breakfast ?Alprazolam (Xanax) - Take 1-2 tablets per day as needed ? ? ?Patient needs refills for Albuterol HFA inhaler - use as needed. ? ?Confirmed delivery date of 10/20/21, advised patient that pharmacy will contact them the  morning of delivery.  ? ? ? ?Care Gaps: ?AWV- 10/08/21 ?CCM- 8/23 ?BP- ? ?Star Rating Drugs: ?Rosuvastatin 10 MG - Last filled 09/15/21 30 DS at [facility] ?Lisinopril- HCTZ 20/12.5 mg - Last filled 09/15/21 30 DS at [facility] ? ? ? ?Ned Clines CMA ?Clinical Pharmacist Assistant ?[phone] ? ?

## 2021-10-17 DIAGNOSIS — G4733 Obstructive sleep apnea (adult) (pediatric): Secondary | ICD-10-CM | POA: Diagnosis not present

## 2021-11-05 ENCOUNTER — Other Ambulatory Visit: Payer: Self-pay

## 2021-11-05 ENCOUNTER — Other Ambulatory Visit: Payer: Self-pay | Admitting: Adult Health

## 2021-11-05 DIAGNOSIS — I4891 Unspecified atrial fibrillation: Secondary | ICD-10-CM

## 2021-11-05 DIAGNOSIS — Z0000 Encounter for general adult medical examination without abnormal findings: Secondary | ICD-10-CM

## 2021-11-05 DIAGNOSIS — J4540 Moderate persistent asthma, uncomplicated: Secondary | ICD-10-CM

## 2021-11-05 DIAGNOSIS — I10 Essential (primary) hypertension: Secondary | ICD-10-CM

## 2021-11-05 DIAGNOSIS — J4541 Moderate persistent asthma with (acute) exacerbation: Secondary | ICD-10-CM

## 2021-11-05 MED ORDER — LISINOPRIL-HYDROCHLOROTHIAZIDE 20-12.5 MG PO TABS: 2.0000 | ORAL_TABLET | Freq: Every day | ORAL | 1 refills | Status: DC

## 2021-11-06 ENCOUNTER — Telehealth: Payer: Medicare Other

## 2021-11-06 ENCOUNTER — Telehealth: Payer: Self-pay | Admitting: Pharmacist

## 2021-11-06 NOTE — Chronic Care Management (AMB) (Signed)
Chronic Care Management Pharmacy Assistant   Name: Glenda Bauer  MRN: [id number] DOB: [birth year]  Reason for Encounter: Medication Review Medication Coordination    Recent office visits:  None   Recent consult visits:  None   Hospital visits:  None in previous 6 months  Medications: Outpatient Encounter Medications as of 11/06/2021  Medication Sig   albuterol (PROVENTIL) (2.5 MG/3ML) 0.083% nebulizer solution INHALE THE contents of ONE vial via NEBULIZER EVERY 6 HOURS AS NEEDED FOR WHEEZING OR FOR SHORTNESS OF BREATH   albuterol (VENTOLIN HFA) 108 (90 Base) MCG/ACT inhaler INHALE TWO PUFFS BY MOUTH INTO LUNGS EVERY 6 HOURS AS NEEDED FOR SHORTNESS OF BREATH OR wheezing   ALPRAZolam (XANAX) 0.25 MG tablet TAKE 1 TO 2 TABLETS BY MOUTH ONCE daily AS NEEDED   budesonide-formoterol (SYMBICORT) 160-4.5 MCG/ACT inhaler Inhale 2 puffs into the lungs 2 (two) times daily.   famotidine (PEPCID) 20 MG tablet Take 20 mg by mouth daily.   flecainide (TAMBOCOR) 50 MG tablet Take 1 tablet (50 mg total) by mouth 2 (two) times daily.   FLUoxetine (PROZAC) 20 MG capsule TAKE 1 CAPSULE(20 MG) BY MOUTH DAILY   fluticasone (FLONASE) 50 MCG/ACT nasal spray Place 2 sprays into both nostrils as needed.   levocetirizine (XYZAL) 5 MG tablet Take 5 mg by mouth as needed for allergies.   lisinopril-hydrochlorothiazide (ZESTORETIC) 20-12.5 MG tablet Take 2 tablets by mouth daily.   metoprolol succinate (TOPROL-XL) 25 MG 24 hr tablet Take 1 tablet (25 mg total) by mouth daily. Take with or immediately following a meal.   montelukast (SINGULAIR) 10 MG tablet TAKE ONE TABLET BY MOUTH EVERYDAY AT BEDTIME   Multiple Vitamin (MULTIVITAMIN) tablet Take 1 tablet by mouth daily.   Multiple Vitamins-Minerals (VISION FORMULA PO) Take by mouth daily.    Omega-3 Fatty Acids (FISH OIL PO) Take 1 capsule by mouth daily.   Probiotic Product (ALIGN) 4 MG CAPS Take by mouth daily.   rosuvastatin (CRESTOR) 10 MG tablet  Take 1 tablet (10 mg total) by mouth daily.   triamcinolone cream (KENALOG) 0.1 % APPLY TOPICALLY TWICE DAILY   XARELTO 20 MG TABS tablet TAKE ONE TABLET BY MOUTH EVERY EVENING   No facility-administered encounter medications on file as of 11/06/2021.  Reviewed chart for medication changes ahead of medication coordination call.  No OVs, Consults, or hospital visits since last care coordination call/Pharmacist visit.   No medication changes indicated.  BP Readings from Last 3 Encounters:  07/28/21 106/66  07/01/21 108/66  06/15/21 100/60    Lab Results  Component Value Date   HGBA1C 5.6 05/08/2021     Patient obtains medications through Adherence Packaging  30 Days   Last adherence delivery included:  Flecainide (Tambocor) 50 mg - Take one tablet at Breakfast and one tablet at Bedtime Rosuvastatin (Crestor) 10 mg - Take one tablet at Breakfast Montelukast (Singulair) 10 mg - Take one tablet at Bedtime Rivaroxaban (Xarelto) 20 mg - Take one tablet at Evening Meal Metropolol Succinate (Toprolol-XL) 25 mg - Take one tablet at Breakfast Fluoxetine (Prozac) 20 mg - Take one capsule at Breakfast Lisinopril-HCTZ (Zestoric) 20-12.5 mg - Take two tablets at Breakfast Alprazolam (Xanax) - Take 1-2 tablets per day as needed  refills for Albuterol HFA inhaler - use as needed.  Patient is due for next adherence delivery on: 11/18/21. Called patient and reviewed medications and coordinated delivery. Packs 30 DS  This delivery to include: Flecainide (Tambocor) 50 mg - Take one tablet at  Breakfast and one tablet at Bedtime Rosuvastatin (Crestor) 10 mg - Take one tablet at Breakfast Montelukast (Singulair) 10 mg - Take one tablet at Bedtime Rivaroxaban (Xarelto) 20 mg - Take one tablet at Evening Meal Metropolol Succinate (Toprolol-XL) 25 mg - Take one tablet at Breakfast Fluoxetine (Prozac) 20 mg - Take one capsule at Breakfast Lisinopril-HCTZ (Zestoric) 20-12.5 mg - Take two tablets at  Breakfast Alprazolam (Xanax) - Take 1-2 tablets per day as needed Albuterol HFA inhaler - use as needed.    Patient will need a short fill of (med), prior to adherence delivery. (To align with sync date or if PRN med)  Coordinated acute fill for (med) to be delivered (date).  Patient declined the following medications (meds) due to (reason)  Patient needs refills for ***.  Confirmed delivery date of ***, advised patient that pharmacy will contact them the morning of delivery.   Care Gaps: AWV- 10/08/21 CCM- 8/23 BP- 120/80 10/08/21  Star Rating Drugs: Rosuvastatin 10 MG - Last filled 10/13/21 30 DS at [facility] Lisinopril- HCTZ 20/12.5 mg - Last filled 10/13/21 30 DS at [facility] Pharmacist Assistant [phone]

## 2021-11-09 DIAGNOSIS — J4541 Moderate persistent asthma with (acute) exacerbation: Secondary | ICD-10-CM | POA: Diagnosis not present

## 2021-11-09 DIAGNOSIS — J209 Acute bronchitis, unspecified: Secondary | ICD-10-CM | POA: Diagnosis not present

## 2021-11-10 ENCOUNTER — Other Ambulatory Visit: Payer: Self-pay | Admitting: Adult Health

## 2021-11-10 MED ORDER — LEVALBUTEROL HCL 0.63 MG/3ML IN NEBU: 0.6300 mg | INHALATION_SOLUTION | Freq: Four times a day (QID) | RESPIRATORY_TRACT | 6 refills | Status: AC | PRN

## 2021-11-11 ENCOUNTER — Other Ambulatory Visit: Payer: Self-pay

## 2021-11-11 MED ORDER — METOPROLOL SUCCINATE ER 25 MG PO TB24: 25.0000 mg | ORAL_TABLET | Freq: Every day | ORAL | 1 refills | Status: DC

## 2021-11-17 DIAGNOSIS — G4733 Obstructive sleep apnea (adult) (pediatric): Secondary | ICD-10-CM | POA: Diagnosis not present

## 2021-11-18 DIAGNOSIS — G4733 Obstructive sleep apnea (adult) (pediatric): Secondary | ICD-10-CM | POA: Diagnosis not present

## 2021-11-20 ENCOUNTER — Other Ambulatory Visit: Payer: Self-pay | Admitting: *Deleted

## 2021-11-20 ENCOUNTER — Other Ambulatory Visit: Payer: Medicare Other | Admitting: *Deleted

## 2021-11-20 DIAGNOSIS — I77819 Aortic ectasia, unspecified site: Secondary | ICD-10-CM | POA: Diagnosis not present

## 2021-11-20 DIAGNOSIS — I712 Thoracic aortic aneurysm, without rupture, unspecified: Secondary | ICD-10-CM | POA: Diagnosis not present

## 2021-11-20 DIAGNOSIS — Z0189 Encounter for other specified special examinations: Secondary | ICD-10-CM | POA: Diagnosis not present

## 2021-11-20 LAB — BASIC METABOLIC PANEL
BUN/Creatinine Ratio: 23 (ref 12–28)
BUN: 23 mg/dL (ref 8–27)
CO2: 24 mmol/L (ref 20–29)
Calcium: 9.2 mg/dL (ref 8.7–10.3)
Chloride: 103 mmol/L (ref 96–106)
Creatinine, Ser: 0.99 mg/dL (ref 0.57–1.00)
Glucose: 98 mg/dL (ref 70–99)
Potassium: 3.7 mmol/L (ref 3.5–5.2)
Sodium: 140 mmol/L (ref 134–144)
eGFR: 61 mL/min/{1.73_m2} (ref >59)

## 2021-11-26 NOTE — Progress Notes (Signed)
Cardiology Office Note:    Date:  11/27/2021   ID:  Glenda Bauer, DOB [birth year], MRN [id number]  PCP:  Shirline Frees, NP  [facility] Cardiologist:  Meriam Sprague, MD  [facility] Electrophysiologist:  None   Referring MD: Shirline Frees, NP    History of Present Illness:    Glenda Bauer is a 71 y.o. female with a hx of HTN, cutaneous T-cell lymphoma, ascending aortic aneurysm, and paroxysmal atrial fibrillation who presents to clinic for CV follow-up.  Per review of the record, the patient was initially diagnosed with atrial fibrillation 03/27/20 at her routine PCP visit. ECG showed afib HR 137. Patient was started on Xarelto for a CHADS2VASC score of 3 and metoprolol for rate control.   Followed by Afib clinic and was initiated on flecainide with return to NSR.   Last seen in clinic on 05/2022 where she was having more dyspnea and fatigue with exertion. We obtained a cardiac CTA on 06/15/21 which showed severe, soft plaque with >70% stenosis in mLAD. FFR negative. Was recommended for continued aggressive medical therapy.  Today, the patient feels overall well. She had a recent asthma exacerbation and was placed on prednisone and now feels a lot better. No chest pain, SOB, lightheadedness or dizziness. She is walking about 2.75miles per day without issues. Tolerating medications without issues. Blood pressure ranging 100-110s. No bleeding issues on xarelto.    Past Medical History:  Diagnosis Date   Allergy    Anxiety    Asthma    Atrial fibrillation (HCC)    Cancer (HCC)    Melanoma and Mycosis Fungosis   Cataract    Cutaneous T-cell lymphoma (HCC) 2011   Dermatologist treated in [place]; Dr. Maggie Schwalbe   Depression    GERD (gastroesophageal reflux disease)    Heart murmur    Hypertension    Neuromuscular disorder (HCC)    Osteoarthritis    Sebaceous cyst 2022   right vulva   Thyroid nodule    Urinary tract infection     Past Surgical  History:  Procedure Laterality Date   Cataracts Bilateral    CHOLECYSTECTOMY  2015   melenoma removal  1979   Sinus Polyps       Current Medications: Current Meds  Medication Sig   albuterol (VENTOLIN HFA) 108 (90 Base) MCG/ACT inhaler INHALE TWO PUFFS BY MOUTH INTO LUNGS EVERY 6 HOURS AS NEEDED FOR SHORTNESS OF BREATH OR wheezing   ALPRAZolam (XANAX) 0.25 MG tablet TAKE 1 TO 2 TABLETS BY MOUTH ONCE daily AS NEEDED   atorvastatin (LIPITOR) 20 MG tablet Take 1 tablet (20 mg total) by mouth daily.   budesonide-formoterol (SYMBICORT) 160-4.5 MCG/ACT inhaler Inhale 2 puffs into the lungs 2 (two) times daily.   famotidine (PEPCID) 20 MG tablet Take 20 mg by mouth daily.   FLUoxetine (PROZAC) 20 MG capsule TAKE 1 CAPSULE(20 MG) BY MOUTH DAILY   fluticasone (FLONASE) 50 MCG/ACT nasal spray Place 2 sprays into both nostrils as needed.   levalbuterol (XOPENEX) 0.63 MG/3ML nebulizer solution Take 3 mLs (0.63 mg total) by nebulization every 6 (six) hours as needed for wheezing or shortness of breath.   levocetirizine (XYZAL) 5 MG tablet Take 5 mg by mouth as needed for allergies.   lisinopril-hydrochlorothiazide (ZESTORETIC) 20-12.5 MG tablet Take 2 tablets by mouth daily.   metoprolol succinate (TOPROL-XL) 25 MG 24 hr tablet Take 1 tablet (25 mg total) by mouth daily. Take with or immediately following a meal.   montelukast (SINGULAIR)  10 MG tablet TAKE ONE TABLET BY MOUTH EVERYDAY AT BEDTIME   Multiple Vitamin (MULTIVITAMIN) tablet Take 1 tablet by mouth daily.   Multiple Vitamins-Minerals (VISION FORMULA PO) Take by mouth daily.    Omega-3 Fatty Acids (FISH OIL PO) Take 1 capsule by mouth daily.   triamcinolone cream (KENALOG) 0.1 % APPLY TOPICALLY TWICE DAILY   XARELTO 20 MG TABS tablet TAKE ONE TABLET BY MOUTH EVERY EVENING   [DISCONTINUED] flecainide (TAMBOCOR) 50 MG tablet Take 1 tablet (50 mg total) by mouth 2 (two) times daily.   [DISCONTINUED] rosuvastatin (CRESTOR) 10 MG tablet Take 1  tablet (10 mg total) by mouth daily.     Allergies:   Patient has no known allergies.   Social History   Socioeconomic History   Marital status: Divorced    Spouse name: Not on file   Number of children: 3   Years of education: 16   Highest education level: Associate degree: academic program  Occupational History   Occupation: Warehouse manager   Tobacco Use   Smoking status: Former    Packs/day: 0.25    Years: 10.00    Total pack years: 2.50    Types: Cigarettes    Quit date: 06/07/1981    Years since quitting: 40.5   Smokeless tobacco: Never  Vaping Use   Vaping Use: Never used  Substance and Sexual Activity   Alcohol use: No    Alcohol/week: 0.0 standard drinks of alcohol   Drug use: No   Sexual activity: Not Currently    Birth control/protection: Post-menopausal  Other Topics Concern   Not on file  Social History Narrative   Separated    Three children ( one lives locally)    Social Determinants of Health   Financial Resource Strain: Low Risk  (10/08/2021)   Overall Financial Resource Strain (CARDIA)    Difficulty of Paying Living Expenses: Not very hard  Food Insecurity: No Food Insecurity (10/08/2021)   Hunger Vital Sign    Worried About Running Out of Food in the Last Year: Never true    Ran Out of Food in the Last Year: Never true  Transportation Needs: No Transportation Needs (10/08/2021)   PRAPARE - Administrator, Civil Service (Medical): No    Lack of Transportation (Non-Medical): No  Physical Activity: Insufficiently Active (10/08/2021)   Exercise Vital Sign    Days of Exercise per Week: 3 days    Minutes of Exercise per Session: 20 min  Stress: Stress Concern Present (10/08/2021)   [employer] of Occupational Health - Occupational Stress Questionnaire    Feeling of Stress : To some extent  Social Connections: Socially Isolated (10/08/2021)   Social Connection and Isolation Panel [NHANES]    Frequency of Communication with Friends and Family: More  than three times a week    Frequency of Social Gatherings with Friends and Family: More than three times a week    Attends Religious Services: Never    Database administrator or Organizations: No    Attends Engineer, structural: Never    Marital Status: Divorced     Family History: The patient's family history includes Arthritis in her mother; Breast cancer (age of onset: 41) in her mother; Crohn's disease in her brother; Ovarian cancer in her mother; Parkinson's disease in her father; Schizophrenia in her brother; Thyroid disease in her mother. There is no history of Colon cancer, Esophageal cancer, Rectal cancer, or Stomach cancer.  ROS:   Please see  the history of present illness.    Review of Systems  Constitutional:  Positive for malaise/fatigue. Negative for chills and fever.  HENT:  Negative for nosebleeds and sinus pain.   Eyes:  Negative for blurred vision.  Respiratory:  Negative for sputum production and shortness of breath.   Cardiovascular:  Negative for chest pain, palpitations, orthopnea, claudication, leg swelling and PND.  Gastrointestinal:  Negative for blood in stool and melena.  Genitourinary:  Negative for hematuria.  Musculoskeletal:  Positive for myalgias.  Neurological:  Negative for dizziness and loss of consciousness.  Endo/Heme/Allergies:  Bruises/bleeds easily.    EKGs/Labs/Other Studies Reviewed:    The following studies were reviewed today: Cardiac CTR 06/2021: FINDINGS: A 120 kV prospective scan was triggered in the descending thoracic aorta at 111 HU's. Axial non-contrast 3 mm slices were carried out through the heart. The data set was analyzed on a dedicated work station and scored using the Agatson method. Gantry rotation speed was 250 msecs and collimation was .6 mm. No beta blockade and 0.8 mg of sl NTG was given. The 3D data set was reconstructed in 5% intervals of the 67-82 % of the R-R cycle. Diastolic phases were analyzed on a  dedicated work station using MPR, MIP and VRT modes. The patient received 80 cc of contrast.   Aorta: Ascending aorta moderately dilated. Aortic atherosclerosis. No dissection.   Aortic Valve:  Trileaflet.  No calcifications.   Coronary Arteries:  Normal coronary origin.  Right dominance.   RCA is a large dominant artery that gives rise to PDA and PLVB. There is no plaque.   Left main is a large artery that gives rise to LAD, and LCX arteries. There is minimal (<25%) calcified plaque.   LAD is a large vessel that has minimal (<25%) mixed plaque proximally. There is severe (>70%) soft plaque in the mid LAD. There are two small diagonal vessels without significant obstructive disease.   LCX is a non-dominant artery that gives rise to two small OM branches. There is no plaque.   Coronary Calcium Score:   Left main: 224   Left anterior descending artery: 319   Left circumflex artery: 0   Right coronary artery: 0   Total: 543   Percentile: 91st   Other findings:   Normal pulmonary vein drainage into the left atrium.   Normal let atrial appendage without a thrombus.   Normal size of the pulmonary artery.   Mild mitral annular calcification.   IMPRESSION: 1. Coronary calcium score of 543. This was 91st percentile for age-, race-, and sex-matched controls.   2. Normal coronary origin with right dominance.   3.  Ascending aorta aneurysm (4.4cm.)   4. Severe (>70%) soft plaque in the mid LAD. Minimal (<25%) plaque in LM and proximal LAD. CAD-RADS 4.   5.  Will send study for FFRct.  FFR: FINDINGS: FFRct analysis was performed on the original cardiac CT angiogram dataset. Diagrammatic representation of the FFRct analysis is provided in a separate PDF document in PACS. This dictation was created using the PDF document and an interactive 3D model of the results. 3D model is not available in the EMR/PACS. Normal FFR range is >0.80.   1. Left Main: FFRct 1.0    2. LAD: FFRct 0.97 proximally, 0.96 mid, 0.82 distal   3. LCX: FFRct 0.99 proximally, 096 mid   4. RCA: FFRct 0.99 proximally, 0.94 mid, 0.89 distal.   IMPRESSION: 1.  FFRct findings are consistent with non-obstructive disease.  2. Recommend aggressive risk factor modification, including LDL goal <70.  MRA of aorta 11/13/20: FINDINGS: VASCULAR   Aorta: Tortuous thoracic aorta with type 3 arch. No pedunculated plaque. No periaortic fluid. No dissection.   The estimated diameter of the ascending aorta is 45 mm. No significant atherosclerotic changes.   Heart: Heart size within normal limits.  No pericardial fluid.   Pulmonary Arteries: Flow signal of the pulmonary arteries unremarkable. Main pulmonary artery measures 32 mm.   NON-VASCULAR   Pleura/lungs: No pleural fluid.   Nodular changes at the bilateral lung apices, pleuroparenchymal interface, poorly characterized on MR.   No confluent airspace disease.   Mediastinum: No adenopathy. Unremarkable thoracic inlet. Hiatal hernia.   Musculoskeletal: Relatively uniform signal within the visualized spinal vertebral bodies. No narrowing of the spinal canal.   Unremarkable appearance of the chest wall with no adenopathy.   Upper abdomen: Unremarkable   IMPRESSION: Estimated maximum diameter of the ascending aorta 45 mm. Recommend semi-annual imaging followup by CTA or MRA and referral to cardiothoracic surgery if not already obtained. This recommendation follows 2010 ACCF/AHA/AATS/ACR/ASA/SCA/SCAI/SIR/STS/SVM Guidelines for the Diagnosis and Management of Patients With Thoracic Aortic Disease. Circulation. 2010; 121: W960-A540. Aortic aneurysm NOS (ICD10-I71.9)   Lung nodularity at the bilateral lung apices, poorly characterized by MR. If MR surveillance of the aorta is elected, suggest at least chest x-ray documentation of what is presumably benign scarring at the lung apices. Otherwise, this could be  characterized on surveillance CT angiogram.  TTE 04/08/20: IMPRESSIONS   1. Left ventricular ejection fraction, by estimation, is 50 to 55%. The  left ventricle has low normal function. The left ventricle has no regional  wall motion abnormalities. Left ventricular diastolic parameters are  indeterminate.   2. Right ventricular systolic function is normal. The right ventricular  size is normal. There is normal pulmonary artery systolic pressure.   3. The mitral valve is normal in structure. Mild to moderate mitral valve  regurgitation. No evidence of mitral stenosis.   4. The aortic valve is normal in structure. Aortic valve regurgitation is  mild to moderate. No aortic stenosis is present.   5. Aortic dilatation noted. There is mild to moderate dilatation of the  ascending aorta, measuring 44 mm.  EKG:  No new ECG today  Recent Labs: 05/08/2021: ALT 29; Hemoglobin 14.4; Platelets 229.0; TSH 0.86 11/20/2021: BUN 23; Creatinine, Ser 0.99; Potassium 3.7; Sodium 140  Recent Lipid Panel    Component Value Date/Time   CHOL 145 05/08/2021 0934   CHOL 122 01/09/2021 0911   TRIG 79.0 05/08/2021 0934   HDL 68.80 05/08/2021 0934   HDL 65 01/09/2021 0911   CHOLHDL 2 05/08/2021 0934   VLDL 15.8 05/08/2021 0934   LDLCALC 61 05/08/2021 0934   LDLCALC 45 01/09/2021 0911   LDLCALC 110 (H) 03/27/2020 0917     Risk Assessment/Calculations:    CHA2DS2-VASc Score = 4  This indicates a 4.8% annual risk of stroke. The patient's score is based upon: CHF History: 0 HTN History: 1 Diabetes History: 0 Stroke History: 0 Vascular Disease History: 1 Age Score: 1 Gender Score: 1     Physical Exam:    VS:  BP 108/68   Pulse (!) 57   Ht 5\' 6"  (1.676 m)   Wt 184 lb 9.6 oz (83.7 kg)   LMP 06/07/2000 (Approximate)   SpO2 97%   BMI 29.80 kg/m     Wt Readings from Last 3 Encounters:  11/27/21 184 lb 9.6 oz (83.7  kg)  10/08/21 186 lb (84.4 kg)  07/28/21 186 lb (84.4 kg)     GEN:   Comfortable, NAD HEENT: Normal NECK: No JVD; No carotid bruits CARDIAC: RRR, 2/6 systolic murmur. No rubs or gallops RESPIRATORY:  CTAB, no wheezes ABDOMEN: Soft, non-tender, non-distended MUSCULOSKELETAL:  Warm, no edema  SKIN: Warm and dry NEUROLOGIC:  Alert and oriented x 3 PSYCHIATRIC:  Normal affect   ASSESSMENT:    1. Coronary artery disease involving native coronary artery of native heart without angina pectoris   2. Statin intolerance   3. Hyperlipidemia, unspecified hyperlipidemia type   4. Medication management   5. Thoracic aortic aneurysm without rupture, unspecified part (HCC)   6. Paroxysmal atrial fibrillation (HCC)   7. Essential hypertension     PLAN:    In order of problems listed above:  #CAD: Coronary CTA 06/2021 with >70% mid LAD lesion with negative FFR. Ca score 543 (91%). Recommended for continued aggressive medical therapy. -Change crestor to lipitor 20mg  daily as is having myalgias with crestor -Not on ASA due to need for xarelto  #Paroxysmal Afib: CHADs-vasc 4. Maintaining sinus rhythm since starting flecainide, however, was found to have >70%mLAD lesion on CTA. On xarelto for [facility] which she is tolerating well. Will refer to EP for consideration of alternative rhythm controlling agents. Possibly a tikosyn load. -Stop flecainide due to significant LAD disease on CTA -Will refer to Afib clinic/EP for consideration of possible tikosyn load -Continue xarelto for [facility] -Continue metop 25mg  XL -Follow-up with Afib clinic as scheduled  #Mild-to-moderate ascending aortic dilation Measures 44mm on TTE and 45mm on MRA chest 11/12/20. CTA 06/15/21 with stable size 44mm. -Followed by Dr. Laneta Simmers -Aggressive blood pressure control -Avoid heavy lifting >30lbs; discussed with the patient -Okay for weight bearing strength exercises like pilates  #Mild-to-moderate MR: -Serial TTEs every 2-3 years (next 04/2022)  #HTN Well controlled at 120/70s. Initial  orthostatic symptoms that have since improved.  -Continue lisinopril-HTCZ 20mg -12.5mg  (2 pills per day) -Continue metop 25mg  XL  #HLD: Having myalgias with crestor. Goal LDL <70 given significant CAD on coronary CTA.  -Change crestor to lipitor 20mg  daily -Will refer to lipid clinic for consideration of PCSK9i    Medication Adjustments/Labs and Tests Ordered: Current medicines are reviewed at length with the patient today.  Concerns regarding medicines are outlined above.  Orders Placed This Encounter  Procedures   AMB Referral to Heartcare Pharm-D    Meds ordered this encounter  Medications   atorvastatin (LIPITOR) 20 MG tablet    Sig: Take 1 tablet (20 mg total) by mouth daily.    Dispense:  90 tablet    Refill:  1     Patient Instructions  Medication Instructions:   STOP TAKING CRESTOR  START TAKING ATORVASTATIN 20 MG BY MOUTH DAILY  STOP TAKING FLECAINIDE NOW    You have been referred to LIPID CLINIC TO SEE OUR PHARMACIST HERE IN THE OFFICE   Follow-Up: At [facility], you and your health needs are our priority.  As part of our continuing mission to provide you with exceptional heart care, we have created designated Provider Care Teams.  These Care Teams include your primary Cardiologist (physician) and Advanced Practice Providers (APPs -  Physician Assistants and Nurse Practitioners) who all work together to provide you with the care you need, when you need it.  We recommend signing up for the patient portal called "MyChart".  Sign up information is provided on this After Visit Summary.  MyChart is  used to connect with patients for Virtual Visits (Telemedicine).  Patients are able to view lab/test results, encounter notes, upcoming appointments, etc.  Non-urgent messages can be sent to your provider as well.   To learn more about what you can do with MyChart, go to [email or web address].    Your next appointment:   6 month(s)  The format for your next  appointment:   In Person  Provider:   Meriam Sprague, MD {   You will see afib clinic on 6/28 at 9 am and parking code is [id number]  Important Information About Sugar           Signed, Meriam Sprague, MD  11/27/2021 10:15 AM    [facility]

## 2021-11-27 ENCOUNTER — Encounter: Payer: Self-pay | Admitting: *Deleted

## 2021-11-27 ENCOUNTER — Encounter: Payer: Self-pay | Admitting: Cardiology

## 2021-11-27 ENCOUNTER — Ambulatory Visit: Payer: Medicare Other | Admitting: Cardiology

## 2021-11-27 VITALS — BP 108/68 | HR 57 | Ht 66.0 in | Wt 184.6 lb

## 2021-11-27 DIAGNOSIS — Z789 Other specified health status: Secondary | ICD-10-CM

## 2021-11-27 DIAGNOSIS — E785 Hyperlipidemia, unspecified: Secondary | ICD-10-CM

## 2021-11-27 DIAGNOSIS — I2510 Atherosclerotic heart disease of native coronary artery without angina pectoris: Secondary | ICD-10-CM

## 2021-11-27 DIAGNOSIS — Z79899 Other long term (current) drug therapy: Secondary | ICD-10-CM | POA: Diagnosis not present

## 2021-11-27 DIAGNOSIS — I712 Thoracic aortic aneurysm, without rupture, unspecified: Secondary | ICD-10-CM | POA: Diagnosis not present

## 2021-11-27 DIAGNOSIS — I10 Essential (primary) hypertension: Secondary | ICD-10-CM

## 2021-11-27 DIAGNOSIS — I480 Paroxysmal atrial fibrillation: Secondary | ICD-10-CM | POA: Diagnosis not present

## 2021-11-27 MED ORDER — ATORVASTATIN CALCIUM 20 MG PO TABS: 20.0000 mg | ORAL_TABLET | Freq: Every day | ORAL | 1 refills | Status: DC

## 2021-12-02 ENCOUNTER — Ambulatory Visit (HOSPITAL_COMMUNITY)
Admission: RE | Admit: 2021-12-02 | Discharge: 2021-12-02 | Disposition: A | Discharge status: Home / Self Care | Payer: Medicare Other | Source: Ambulatory Visit | Attending: Physician Assistant | Admitting: Physician Assistant

## 2021-12-02 ENCOUNTER — Encounter (HOSPITAL_COMMUNITY): Payer: Self-pay | Admitting: Physician Assistant

## 2021-12-02 VITALS — BP 110/70 | HR 56 | Ht 66.0 in | Wt 183.2 lb

## 2021-12-02 DIAGNOSIS — D6869 Other thrombophilia: Secondary | ICD-10-CM

## 2021-12-02 DIAGNOSIS — I2510 Atherosclerotic heart disease of native coronary artery without angina pectoris: Secondary | ICD-10-CM | POA: Diagnosis not present

## 2021-12-02 DIAGNOSIS — I10 Essential (primary) hypertension: Secondary | ICD-10-CM | POA: Diagnosis not present

## 2021-12-02 DIAGNOSIS — Z8572 Personal history of non-Hodgkin lymphomas: Secondary | ICD-10-CM | POA: Diagnosis not present

## 2021-12-02 DIAGNOSIS — Z79899 Other long term (current) drug therapy: Secondary | ICD-10-CM | POA: Diagnosis not present

## 2021-12-02 DIAGNOSIS — Z7902 Long term (current) use of antithrombotics/antiplatelets: Secondary | ICD-10-CM | POA: Insufficient documentation

## 2021-12-02 DIAGNOSIS — I480 Paroxysmal atrial fibrillation: Secondary | ICD-10-CM | POA: Insufficient documentation

## 2021-12-02 NOTE — Progress Notes (Signed)
12/02/21 Per [facility] received new prescription of Lipitor 20 mg once daily. Confirmed it is to replace her Rosuvastatin 20 mg due to myalgias in chart notes. Call to patient to see if she would like it sent to her now or when her packs are due to end on 12/20/21 did not reach. Left message of the above and my contact information for return call, pharmacy advised.    [place] Clinical Pharmacist Assistant [phone]

## 2021-12-02 NOTE — Progress Notes (Signed)
Primary Care Physician: [name], NP Primary Cardiologist: Dr Johney Frame  Primary Electrophysiologist: none Referring Physician: [name]   Glenda Bauer is a 71 y.o. female with a history of HTN, CAD, cutaneous T-cell lymphoma, and atrial fibrillation who presents for follow up in the [facility].  The patient was initially diagnosed with atrial fibrillation 03/27/20 at her routine PCP visit. She does admit that she had been having more frequent palpitations for a few months prior but these symptoms were brief. ECG showed afib HR 137. Patient was started on Xarelto for a CHADS2VASC score of 4 and metoprolol for rate control. She denies any significant snoring or alcohol use. Patient seen by Dr Johney Frame on 05/25/21 and was having ongoing fatigue and dyspnea with exertion. Cardiac CT ordered which did show nonobstructive CAD, flecainide was discontinued.   On follow up today, patient reports that she has done well since her last visit. She is in SR today. No bleeding issues on anticoagulation.   Today, she denies symptoms of palpitations, chest pain, shortness of breath, orthopnea, PND, lower extremity edema, dizziness, presyncope, syncope, snoring, daytime somnolence, bleeding, or neurologic sequela. The patient is tolerating medications without difficulties and is otherwise without complaint today.    Atrial Fibrillation Risk Factors:  she does not have symptoms or diagnosis of sleep apnea. she does not have a history of rheumatic fever. she does not have a history of alcohol use. The patient does not have a history of early familial atrial fibrillation or other arrhythmias.  she has a BMI of Body mass index is 29.57 kg/m.Marland Kitchen Filed Weights   12/02/21 0854  Weight: 83.1 kg     Family History  Problem Relation Age of Onset   Arthritis Mother    Ovarian cancer Mother        Metastatic   Breast cancer Mother 51   Thyroid disease Mother     Parkinson's disease Father    Crohn's disease Brother    Schizophrenia Brother    Colon cancer Neg Hx    Esophageal cancer Neg Hx    Rectal cancer Neg Hx    Stomach cancer Neg Hx      Atrial Fibrillation Management history:  Previous antiarrhythmic drugs: flecainide  Previous cardioversions: none Previous ablations: none CHADS2VASC score: 4 Anticoagulation history: Xarelto    Past Medical History:  Diagnosis Date   Allergy    Anxiety    Asthma    Atrial fibrillation ([place])    Cancer ([place])    Melanoma and Mycosis Fungosis   Cataract    Cutaneous T-cell lymphoma ([place]) 2011   Dermatologist treated in [place]; Dr. Hipolito Bayley   Depression    GERD (gastroesophageal reflux disease)    Heart murmur    Hypertension    Neuromuscular disorder ([place])    Osteoarthritis    Sebaceous cyst 2022   right vulva   Thyroid nodule    Urinary tract infection    Past Surgical History:  Procedure Laterality Date   Cataracts Bilateral    CHOLECYSTECTOMY  2015   melenoma removal  1979   Sinus Polyps       Current Outpatient Medications  Medication Sig Dispense Refill   albuterol (VENTOLIN HFA) 108 (90 Base) MCG/ACT inhaler INHALE TWO PUFFS BY MOUTH INTO LUNGS EVERY 6 HOURS AS NEEDED FOR SHORTNESS OF BREATH OR wheezing 8 g 2   ALPRAZolam (XANAX) 0.25 MG tablet TAKE 1 TO 2 TABLETS BY MOUTH ONCE daily AS NEEDED  30 tablet 2   atorvastatin (LIPITOR) 20 MG tablet Take 1 tablet (20 mg total) by mouth daily. 90 tablet 1   budesonide-formoterol (SYMBICORT) 160-4.5 MCG/ACT inhaler Inhale 2 puffs into the lungs 2 (two) times daily. 1 each 11   famotidine (PEPCID) 20 MG tablet Take 20 mg by mouth daily.     FLUoxetine (PROZAC) 20 MG capsule TAKE 1 CAPSULE(20 MG) BY MOUTH DAILY 90 capsule 1   fluticasone (FLONASE) 50 MCG/ACT nasal spray Place 2 sprays into both nostrils as needed.     levalbuterol (XOPENEX) 0.63 MG/3ML nebulizer solution Take 3 mLs (0.63 mg total) by nebulization every 6 (six)  hours as needed for wheezing or shortness of breath. 225 mL 6   levocetirizine (XYZAL) 5 MG tablet Take 5 mg by mouth as needed for allergies.     lisinopril-hydrochlorothiazide (ZESTORETIC) 20-12.5 MG tablet Take 2 tablets by mouth daily. 180 tablet 1   metoprolol succinate (TOPROL-XL) 25 MG 24 hr tablet Take 1 tablet (25 mg total) by mouth daily. Take with or immediately following a meal. 90 tablet 1   montelukast (SINGULAIR) 10 MG tablet TAKE ONE TABLET BY MOUTH EVERYDAY AT BEDTIME 90 tablet 3   Multiple Vitamin (MULTIVITAMIN) tablet Take 1 tablet by mouth daily.     Multiple Vitamins-Minerals (VISION FORMULA PO) Take by mouth daily.      Omega-3 Fatty Acids (FISH OIL PO) Take 1 capsule by mouth daily.     triamcinolone cream (KENALOG) 0.1 % APPLY TOPICALLY TWICE DAILY 454 g 0   XARELTO 20 MG TABS tablet TAKE ONE TABLET BY MOUTH EVERY EVENING 90 tablet 3   No current facility-administered medications for this encounter.    No Known Allergies  Social History   Socioeconomic History   Marital status: Divorced    Spouse name: Not on file   Number of children: 3   Years of education: 16   Highest education level: Associate degree: academic program  Occupational History   Occupation: Medical sales representative   Tobacco Use   Smoking status: Former    Packs/day: 0.25    Years: 10.00    Total pack years: 2.50    Types: Cigarettes    Quit date: 06/07/1981    Years since quitting: 40.5   Smokeless tobacco: Never   Tobacco comments:    Former smoker 12/02/21  Vaping Use   Vaping Use: Never used  Substance and Sexual Activity   Alcohol use: No    Alcohol/week: 0.0 standard drinks of alcohol   Drug use: No   Sexual activity: Not Currently    Birth control/protection: Post-menopausal  Other Topics Concern   Not on file  Social History Narrative   Separated    Three children ( one lives locally)    Social Determinants of Health   Financial Resource Strain: [place]  (10/08/2021)   Overall  Financial Resource Strain (CARDIA)    Difficulty of Paying Living Expenses: Not very hard  Food Insecurity: No Food Insecurity (10/08/2021)   Hunger Vital Sign    Worried About Running Out of Food in the Last Year: Never true    [place] in the Last Year: Never true  Transportation Needs: No Transportation Needs (10/08/2021)   PRAPARE - Hydrologist (Medical): No    Lack of Transportation (Non-Medical): No  Physical Activity: Insufficiently Active (10/08/2021)   Exercise Vital Sign    Days of Exercise per Week: 3 days    Minutes  of Exercise per Session: 20 min  Stress: Stress Concern Present (10/08/2021)   [place]    Feeling of Stress : To some extent  Social Connections: Socially Isolated (10/08/2021)   Social Connection and Isolation Panel [NHANES]    Frequency of Communication with Friends and Family: More than three times a week    Frequency of Social Gatherings with Friends and Family: More than three times a week    Attends Religious Services: Never    Marine scientist or Organizations: No    Attends Archivist Meetings: Never    Marital Status: Divorced  Human resources officer Violence: Not At Risk (10/08/2021)   Humiliation, Afraid, Rape, and Kick questionnaire    Fear of Current or Ex-Partner: No    Emotionally Abused: No    Physically Abused: No    Sexually Abused: No     ROS- All systems are reviewed and negative except as per the HPI above.  Physical Exam: Vitals:   12/02/21 0854  BP: 110/70  Pulse: (!) 56  Weight: 83.1 kg  Height: '5\' 6"'$  (1.676 m)     GEN- The patient is a well appearing female, alert and oriented x 3 today.   HEENT-head normocephalic, atraumatic, sclera clear, conjunctiva pink, hearing intact, trachea midline. Lungs- Clear to ausculation bilaterally, normal work of breathing Heart- Regular rate and rhythm, no murmurs, rubs or gallops   GI- soft, NT, ND, + BS Extremities- no clubbing, cyanosis, or edema MS- no significant deformity or atrophy Skin- no rash or lesion Psych- euthymic mood, full affect Neuro- strength and sensation are intact   Wt Readings from Last 3 Encounters:  12/02/21 83.1 kg  11/27/21 83.7 kg  10/08/21 84.4 kg    EKG today demonstrates  SB Vent. rate 56 BPM PR interval 182 ms QRS duration 84 ms QT/QTcB 468/451 ms  Echo 04/08/20 demonstrated 1. Left ventricular ejection fraction, by estimation, is 50 to 55%. The  left ventricle has low normal function. The left ventricle has no regional  wall motion abnormalities. Left ventricular diastolic parameters are  indeterminate.   2. Right ventricular systolic function is normal. The right ventricular  size is normal. There is normal pulmonary artery systolic pressure.   3. The mitral valve is normal in structure. Mild to moderate mitral valve  regurgitation. No evidence of mitral stenosis.   4. The aortic valve is normal in structure. Aortic valve regurgitation is  mild to moderate. No aortic stenosis is present.   5. Aortic dilatation noted. There is mild to moderate dilatation of the  ascending aorta, measuring 44 mm.   Epic records are reviewed at length today  CHA2DS2-VASc Score = 4  The patient's score is based upon: CHF History: 0 HTN History: 1 Diabetes History: 0 Stroke History: 0 Vascular Disease History: 1 Age Score: 1 Gender Score: 1      ASSESSMENT AND PLAN: 1. Paroxysmal Atrial Fibrillation (ICD10:  I48.0) The patient's CHA2DS2-VASc score is 4, indicating a 4.8% annual risk of stroke.   Patient in [place] today.  We discussed rhythm control options today. Class IC no longer an option with CAD. We specifically discussed alternate AAD (Multaq, dofetilide) vs ablation. Patient would like to avoid additional medication if possible and is interested in consultation with EP to discuss ablation, will refer. Check  echocardiogram. Continue Toprol 25 mg daily Continue Xarelto 20 mg daily  2. Secondary Hypercoagulable State (ICD10:  [id number]) The patient is  at significant risk for stroke/thromboembolism based upon her CHA2DS2-VASc Score of 4.  Continue Rivaroxaban (Xarelto).   3. HTN Stable, no changes today.  4. CAD CAC score 54 91st percentile with severe plaque in LAD (FFR negative) No anginal symptoms.   Follow up with EP to discuss possible ablation.    [facility] [address] [place],  [address] [phone] 12/02/2021 9:03 AM

## 2021-12-03 ENCOUNTER — Telehealth: Payer: Self-pay | Admitting: Pharmacist

## 2021-12-03 ENCOUNTER — Other Ambulatory Visit: Payer: Self-pay | Admitting: Adult Health

## 2021-12-03 DIAGNOSIS — F32A Depression, unspecified: Secondary | ICD-10-CM

## 2021-12-03 DIAGNOSIS — F419 Anxiety disorder, unspecified: Secondary | ICD-10-CM

## 2021-12-03 NOTE — Telephone Encounter (Signed)
Okay for refill?    LOV 05/08/2022 for CPE

## 2021-12-03 NOTE — Chronic Care Management (AMB) (Unsigned)
Chronic Care Management Pharmacy Assistant   Name: Glenda Bauer  MRN: [id number] DOB: April 28, 1951  Reason for Encounter: Disease State and Medication Review   Conditions to be addressed/monitored: HTN  Recent office visits:  None  Recent consult visits:  12/02/21 Glenda Barre, PA (Cardiology) - Patient presented for Paroxysmal atrial fibrillation and other concerns. No medication changes.  11/27/21 Glenda Bergeron, MD(Cardiology) - Patient presented for CAD involving native coronary artery of native heart without angina pectoris and other concerns. Prescribed Atorvastatin 20 mg. Stopped Flecainide Acetate. Stopped Probiotic. Stopped Rosuvastatin.  11/09/21 Glenda Pastures, PA  [facility]) - Patient presented for Acute bronchitis and other concerns. Prescribed Levalbuterol. Prescribed Prednisone. Prescribed Azithromycin.  Hospital visits:  None in previous 6 months  Medications: Outpatient Encounter Medications as of 12/03/2021  Medication Sig   albuterol (VENTOLIN HFA) 108 (90 Base) MCG/ACT inhaler INHALE TWO PUFFS BY MOUTH INTO LUNGS EVERY 6 HOURS AS NEEDED FOR SHORTNESS OF BREATH OR wheezing   ALPRAZolam (XANAX) 0.25 MG tablet TAKE 1 TO 2 TABLETS BY MOUTH ONCE daily AS NEEDED   atorvastatin (LIPITOR) 20 MG tablet Take 1 tablet (20 mg total) by mouth daily.   budesonide-formoterol (SYMBICORT) 160-4.5 MCG/ACT inhaler Inhale 2 puffs into the lungs 2 (two) times daily.   famotidine (PEPCID) 20 MG tablet Take 20 mg by mouth daily.   FLUoxetine (PROZAC) 20 MG capsule TAKE 1 CAPSULE(20 MG) BY MOUTH DAILY   fluticasone (FLONASE) 50 MCG/ACT nasal spray Place 2 sprays into both nostrils as needed.   levalbuterol (XOPENEX) 0.63 MG/3ML nebulizer solution Take 3 mLs (0.63 mg total) by nebulization every 6 (six) hours as needed for wheezing or shortness of breath.   levocetirizine (XYZAL) 5 MG tablet Take 5 mg by mouth as needed for allergies.   lisinopril-hydrochlorothiazide  (ZESTORETIC) 20-12.5 MG tablet Take 2 tablets by mouth daily.   metoprolol succinate (TOPROL-XL) 25 MG 24 hr tablet Take 1 tablet (25 mg total) by mouth daily. Take with or immediately following a meal.   montelukast (SINGULAIR) 10 MG tablet TAKE ONE TABLET BY MOUTH EVERYDAY AT BEDTIME   Multiple Vitamin (MULTIVITAMIN) tablet Take 1 tablet by mouth daily.   Multiple Vitamins-Minerals (VISION FORMULA PO) Take by mouth daily.    Omega-3 Fatty Acids (FISH OIL PO) Take 1 capsule by mouth daily.   triamcinolone cream (KENALOG) 0.1 % APPLY TOPICALLY TWICE DAILY   XARELTO 20 MG TABS tablet TAKE ONE TABLET BY MOUTH EVERY EVENING   No facility-administered encounter medications on file as of 12/03/2021.   Reviewed chart prior to disease state call. Spoke with patient regarding BP  Recent Office Vitals: BP Readings from Last 3 Encounters:  12/02/21 110/70  11/27/21 108/68  07/28/21 106/66   Pulse Readings from Last 3 Encounters:  12/02/21 (!) 56  11/27/21 (!) 57  07/28/21 66    Wt Readings from Last 3 Encounters:  12/02/21 183 lb 3.2 oz (83.1 kg)  11/27/21 184 lb 9.6 oz (83.7 kg)  10/08/21 186 lb (84.4 kg)     Kidney Function Lab Results  Component Value Date/Time   CREATININE 0.99 11/20/2021 08:47 AM   CREATININE 0.94 05/25/2021 09:46 AM   CREATININE 0.89 03/27/2020 09:17 AM   GFR 62.30 05/08/2021 09:34 AM   GFRNONAA 59 (L) 05/21/2020 11:21 AM   GFRNONAA >60 04/17/2020 12:30 PM   GFRNONAA 65 03/27/2020 09:17 AM   GFRAA 68 05/21/2020 11:21 AM   GFRAA 76 03/27/2020 09:17 AM  Latest Ref Rng & Units 11/20/2021    8:47 AM 05/25/2021    9:46 AM 05/08/2021    9:34 AM  BMP  Glucose 70 - 99 mg/dL 98  107  99   BUN 8 - 27 mg/dL '23  28  20   '$ Creatinine 0.57 - 1.00 mg/dL 0.99  0.94  0.92   BUN/Creat Ratio 12 - '28 23  30    '$ Sodium 134 - 144 mmol/L 140  142  140   Potassium 3.5 - 5.2 mmol/L 3.7  3.7  4.0   Chloride 96 - 106 mmol/L 103  103  104   CO2 20 - 29 mmol/L '24  23  28    '$ Calcium 8.7 - 10.3 mg/dL 9.2  9.5  10.1     Current antihypertensive regimen:  Lisinopril - HCTZ 20-12.5 1.5 tablets daily  Metoprolol succinate 25 mg 1 tablet daily How often are you checking your Blood Pressure? {CHL HP BP Monitoring Frequency:[phone]} Current home BP readings:  BP Readings from Last 3 Encounters:  12/02/21 110/70  11/27/21 108/68  07/28/21 106/66   What recent interventions/DTPs have been made by any provider to improve Blood Pressure control since last CPP Visit: *** Any recent hospitalizations or ED visits since last visit with CPP? {yes/no:20286} What diet changes have been made to improve Blood Pressure Control?  *** What exercise is being done to improve your Blood Pressure Control?  ***  Adherence Review: Is the patient currently on ACE/ARB medication? {yes/no:20286} Does the patient have >5 day gap between last estimated fill dates? {yes/no:20286}  Reviewed chart for medication changes ahead of medication coordination call.  BP Readings from Last 3 Encounters:  12/02/21 110/70  11/27/21 108/68  07/28/21 106/66    Lab Results  Component Value Date   HGBA1C 5.6 05/08/2021     Patient obtains medications through Adherence Packaging  30 Days   Last adherence delivery included:  Flecainide (Tambocor) 50 mg - Take one tablet at Breakfast and one tablet at Bedtime Rosuvastatin (Crestor) 10 mg - Take one tablet at Breakfast Montelukast (Singulair) 10 mg - Take one tablet at Bedtime Rivaroxaban (Xarelto) 20 mg - Take one tablet at Evening Meal Metropolol Succinate (Toprolol-XL) 25 mg - Take one tablet at Breakfast Fluoxetine (Prozac) 20 mg - Take one capsule at Breakfast Lisinopril-HCTZ (Zestoric) 20-12.5 mg - Take two tablets at Breakfast Alprazolam (Xanax) - Take 1-2 tablets per day as needed Albuterol HFA inhaler - use as needed.   Coordinated acute fill for Albuterol Nebulizer Soluton to be delivered on 6/6/230. Patient reports she has been  using with her bronchitis and has 2 vials left     Patient is due for next adherence delivery on: 12/17/21. Called patient and reviewed medications and coordinated delivery. Packs 30 DS   This delivery to include: Atorvastatin 20 mg : Take one tab daily at Breakfast Montelukast (Singulair) 10 mg - Take one tablet at Bedtime Rivaroxaban (Xarelto) 20 mg - Take one tablet at Evening Meal Metropolol Succinate (Toprolol-XL) 25 mg - Take one tablet at Breakfast Fluoxetine (Prozac) 20 mg - Take one capsule at Breakfast Lisinopril-HCTZ (Zestoric) 20-12.5 mg - Take two tablets at Breakfast Alprazolam (Xanax) - Take 1-2 tablets per day as needed Albuterol HFA inhaler - use as needed.   Patient reports she would like Atorvastatin before/at fill date and will remove/finish Rosuvastatin from packs.  Patient will need a short fill of (med), prior to adherence delivery. (To align with sync date or  if PRN med)  Coordinated acute fill for (med) to be delivered (date).  Patient declined the following medications (meds) due to (reason)  Patient needs refills for ***.  Confirmed delivery date of 12/17/21, advised patient that pharmacy will contact them the morning of delivery.    Care Gaps: COVID Booster - Overdue Zoster Vaccine - Postponed PNA Vaccine - Postponed Mammogram - Postponed TDAP - Postponed BP- 110/70 12/02/21 AWV- 5/23 CCM- 8/23  Star Rating Drugs: Atorvastatin 20 MG - Last filled 11/27/21 30 DS at [facility] Lisinopril- HCTZ 20/12.5 mg - Last filled 11/11/21 30 DS at [facility] Pharmacist Assistant [phone]

## 2021-12-09 ENCOUNTER — Encounter (HOSPITAL_COMMUNITY): Payer: Self-pay | Admitting: *Deleted

## 2021-12-09 ENCOUNTER — Ambulatory Visit (HOSPITAL_COMMUNITY)
Admission: RE | Admit: 2021-12-09 | Discharge: 2021-12-09 | Disposition: A | Discharge status: Home / Self Care | Payer: Medicare Other | Source: Ambulatory Visit | Attending: Physician Assistant | Admitting: Physician Assistant

## 2021-12-09 DIAGNOSIS — I480 Paroxysmal atrial fibrillation: Secondary | ICD-10-CM | POA: Insufficient documentation

## 2021-12-09 LAB — ECHOCARDIOGRAM COMPLETE
AR max vel: 1.71 cm2
AV Area VTI: 1.66 cm2
AV Area mean vel: 1.68 cm2
AV Mean grad: 6 mmHg
AV Peak grad: 11.2 mmHg
Ao pk vel: 1.67 m/s
Area-P 1/2: 3.17 cm2
P 1/2 time: 904 msec
S' Lateral: 3.3 cm

## 2021-12-09 NOTE — Progress Notes (Signed)
Echocardiogram 2D Echocardiogram has been performed.  Glenda Bauer 12/09/2021, 11:49 AM

## 2021-12-17 DIAGNOSIS — G4733 Obstructive sleep apnea (adult) (pediatric): Secondary | ICD-10-CM | POA: Diagnosis not present

## 2021-12-21 NOTE — Progress Notes (Unsigned)
Synopsis: Referred for excessive daytime sleepiness, fatigue by [name], NP  Subjective:   PATIENT ID: Glenda Bauer GENDER: female DOB: Oct 30, 1950, MRN: [id number]  No chief complaint on file.  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   She says she had covid last April, deconditioned afterward holding off on exercise for a while due to fatigue. Cough post covid has finally improved somewhat. Very sleepy as well, naps twice daily now. She does snore loudly. No PND but son thinks she does have it.    Has not needed steroids for asthma since her sinus surgery done with [facility] (done for sinusitis 5ya). She says she did have nasal polyps. 30 years ago was in asthma clinic in [place]. Did have allergy shots many years ago. Very steroid responsive in past.  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 She has lived in [place] for several years, [place] for 5y, [place] for 5y. No recent travel. Has a dog at home.    Interval HPI: At last visit FENO 44, PFT with reversible mild obstruction, HST with mild OSA AHI 11, nadir O2 85% - doesn't clearly have positional OSA - not much time spent sleeping on side.   Otherwise pertinent review of systems is negative.    Past Medical History:  Diagnosis Date   Allergy    Anxiety    Asthma    Atrial fibrillation ([place])    Cancer (HCC)    Melanoma and Mycosis Fungosis   Cataract    Cutaneous T-cell lymphoma ([place]) 2011   Dermatologist treated in [place]; Dr. Hipolito Bayley   Depression    GERD (gastroesophageal reflux disease)    Heart murmur    Hypertension     Neuromuscular disorder ([place])    Osteoarthritis    Sebaceous cyst 2022   right vulva   Thyroid nodule    Urinary tract infection      Family History  Problem Relation Age of Onset   Arthritis Mother    Ovarian cancer Mother        Metastatic   Breast cancer Mother 36   Thyroid disease Mother    Parkinson's disease Father    Crohn's disease Brother    Schizophrenia Brother    Colon cancer Neg Hx    Esophageal cancer Neg Hx    Rectal cancer Neg Hx    Stomach cancer Neg Hx      Past Surgical History:  Procedure Laterality Date   Cataracts Bilateral    CHOLECYSTECTOMY  2015   melenoma removal  1979   Sinus Polyps       Social History   Socioeconomic History   Marital status: Divorced    Spouse name: Not on file   Number of children: 3   Years of education: 16   Highest education level: Associate degree: academic program  Occupational History   Occupation: Medical sales representative   Tobacco Use   Smoking status: Former    Packs/day: 0.25    Years: 10.00    Total pack years: 2.50    Types: Cigarettes    Quit date: 06/07/1981  Years since quitting: 40.5   Smokeless tobacco: Never   Tobacco comments:    Former smoker 12/02/21  Vaping Use   Vaping Use: Never used  Substance and Sexual Activity   Alcohol use: No    Alcohol/week: 0.0 standard drinks of alcohol   Drug use: No   Sexual activity: Not Currently    Birth control/protection: Post-menopausal  Other Topics Concern   Not on file  Social History Narrative   Separated    Three children ( one lives locally)    Social Determinants of Health   Financial Resource Strain: Low Risk  (10/08/2021)   Overall Financial Resource Strain (CARDIA)    Difficulty of Paying Living Expenses: Not very hard  Food Insecurity: No Food Insecurity (10/08/2021)   Hunger Vital Sign    Worried About Running Out of Food in the Last Year: Never true    [place] in the Last Year: Never true  Transportation Needs: No Transportation Needs  (10/08/2021)   PRAPARE - Hydrologist (Medical): No    Lack of Transportation (Non-Medical): No  Physical Activity: Insufficiently Active (10/08/2021)   Exercise Vital Sign    Days of Exercise per Week: 3 days    Minutes of Exercise per Session: 20 min  Stress: Stress Concern Present (10/08/2021)   [place]    Feeling of Stress : To some extent  Social Connections: Socially Isolated (10/08/2021)   Social Connection and Isolation Panel [NHANES]    Frequency of Communication with Friends and Family: More than three times a week    Frequency of Social Gatherings with Friends and Family: More than three times a week    Attends Religious Services: Never    Marine scientist or Organizations: No    Attends Archivist Meetings: Never    Marital Status: Divorced  Human resources officer Violence: Not At Risk (10/08/2021)   Humiliation, Afraid, Rape, and Kick questionnaire    Fear of Current or Ex-Partner: No    Emotionally Abused: No    Physically Abused: No    Sexually Abused: No     No Known Allergies   Outpatient Medications Prior to Visit  Medication Sig Dispense Refill   albuterol (VENTOLIN HFA) 108 (90 Base) MCG/ACT inhaler INHALE TWO PUFFS BY MOUTH INTO LUNGS EVERY 6 HOURS AS NEEDED FOR SHORTNESS OF BREATH OR FOR WHEEZING 8.5 g 1   ALPRAZolam (XANAX) 0.25 MG tablet TAKE 1 TO 2 TABLETS BY MOUTH ONCE daily AS NEEDED 30 tablet 2   atorvastatin (LIPITOR) 20 MG tablet Take 1 tablet (20 mg total) by mouth daily. 90 tablet 1   budesonide-formoterol (SYMBICORT) 160-4.5 MCG/ACT inhaler Inhale 2 puffs into the lungs 2 (two) times daily. 1 each 11   famotidine (PEPCID) 20 MG tablet Take 20 mg by mouth daily.     FLUoxetine (PROZAC) 20 MG capsule TAKE ONE CAPSULE BY MOUTH ONCE DAILY 90 capsule 1   fluticasone (FLONASE) 50 MCG/ACT nasal spray Place 2 sprays into both nostrils as needed.      levalbuterol (XOPENEX) 0.63 MG/3ML nebulizer solution Take 3 mLs (0.63 mg total) by nebulization every 6 (six) hours as needed for wheezing or shortness of breath. 225 mL 6   levocetirizine (XYZAL) 5 MG tablet Take 5 mg by mouth as needed for allergies.     lisinopril-hydrochlorothiazide (ZESTORETIC) 20-12.5 MG tablet Take 2 tablets by mouth daily. 180 tablet 1  metoprolol succinate (TOPROL-XL) 25 MG 24 hr tablet Take 1 tablet (25 mg total) by mouth daily. Take with or immediately following a meal. 90 tablet 1   montelukast (SINGULAIR) 10 MG tablet TAKE ONE TABLET BY MOUTH EVERYDAY AT BEDTIME 90 tablet 3   Multiple Vitamin (MULTIVITAMIN) tablet Take 1 tablet by mouth daily.     Multiple Vitamins-Minerals (VISION FORMULA PO) Take by mouth daily.      Omega-3 Fatty Acids (FISH OIL PO) Take 1 capsule by mouth daily.     triamcinolone cream (KENALOG) 0.1 % APPLY TOPICALLY TWICE DAILY 454 g 0   XARELTO 20 MG TABS tablet TAKE ONE TABLET BY MOUTH EVERY EVENING 90 tablet 3   No facility-administered medications prior to visit.       Objective:   Physical Exam:  General appearance: 71 y.o., female, NAD, conversant  Eyes: anicteric sclerae; PERRL, tracking appropriately HENT: NCAT; MMM Neck: Trachea midline; no lymphadenopathy, no JVD Lungs: CTAB, no crackles, no wheeze, with normal respiratory effort CV: RRR, no murmur  Abdomen: Soft, non-tender; non-distended, BS present  Extremities: No peripheral edema, warm Skin: Normal turgor and texture; no rash Psych: Appropriate affect Neuro: Alert and oriented to person and place, no focal deficit     There were no vitals filed for this visit.    on RA BMI Readings from Last 3 Encounters:  12/02/21 29.57 kg/m  11/27/21 29.80 kg/m  10/08/21 30.02 kg/m   Wt Readings from Last 3 Encounters:  12/02/21 183 lb 3.2 oz (83.1 kg)  11/27/21 184 lb 9.6 oz (83.7 kg)  10/08/21 186 lb (84.4 kg)     CBC    Component Value Date/Time   WBC 5.1  05/08/2021 0934   RBC 4.80 05/08/2021 0934   HGB 14.4 05/08/2021 0934   HCT 42.4 05/08/2021 0934   PLT 229.0 05/08/2021 0934   MCV 88.4 05/08/2021 0934   MCH 30.8 03/27/2020 0917   MCHC 34.1 05/08/2021 0934   RDW 13.3 05/08/2021 0934   LYMPHSABS 1.5 05/08/2021 0934   MONOABS 0.5 05/08/2021 0934   EOSABS 0.3 05/08/2021 0934   BASOSABS 0.1 05/08/2021 0934   Eos 300  Chest Imaging:  Coronary CT 06/15/21 reviewed by me remarkable for hiatal hernia  Pulmonary Functions Testing Results:    Latest Ref Rng & Units 07/28/2021   10:49 AM  PFT Results  FVC-Pre L 3.20   FVC-Predicted Pre % 100   FVC-Post L 3.15   FVC-Predicted Post % 99   Pre FEV1/FVC % % 64   Post FEV1/FCV % % 74   FEV1-Pre L 2.04   FEV1-Predicted Pre % 85   FEV1-Post L 2.32   DLCO uncorrected ml/min/mmHg 20.43   DLCO UNC% % 98   DLCO corrected ml/min/mmHg 20.43   DLCO COR %Predicted % 98   DLVA Predicted % 97   TLC L 5.89   TLC % Predicted % 110   RV % Predicted % 119      Echocardiogram:   TTE 04/2020 indeterminate for dd      Assessment & Plan:   # DOE # Mild persistent asthma # Mild eosinophilia # Nasal polyposis s/p sinus surgery Worse DOE could be multifactorial deconditioning post covid, untreated sleep-disordered breathing, symptomatic nCAD/CAD given CTA findings, chronotropic insufficiency on beta blockade, suboptimal symbicort use or inadequate asthma regimen.    # Excessive daytime sleepiness # Snoring # PND Needs sleep study.   Plan:  - symbicort 2 puffs twice daily with spacer, rinsing  mouth afterward, instructed in ideal use - PFTs. If has developed component of fixed obstruction could add LAMA.  - applauded her efforts at staying active - if no major unaddressed component of lung disease on PFT and sleep study either without OSA or shows just mild OSA then may need consideration of LHC   RTC 2-4 weeks with me or APP  [name], MD [facility] 12/21/2021 10:20 AM

## 2021-12-22 ENCOUNTER — Ambulatory Visit: Payer: Medicare Other

## 2021-12-23 ENCOUNTER — Encounter: Payer: Self-pay | Admitting: Student

## 2021-12-23 ENCOUNTER — Ambulatory Visit: Payer: Medicare Other | Admitting: Student

## 2021-12-23 VITALS — BP 210/64 | HR 53 | Temp 97.9°F | Ht 66.0 in | Wt 180.6 lb

## 2021-12-23 DIAGNOSIS — Z9989 Dependence on other enabling machines and devices: Secondary | ICD-10-CM

## 2021-12-23 DIAGNOSIS — J4540 Moderate persistent asthma, uncomplicated: Secondary | ICD-10-CM | POA: Diagnosis not present

## 2021-12-23 DIAGNOSIS — G4733 Obstructive sleep apnea (adult) (pediatric): Secondary | ICD-10-CM | POA: Diagnosis not present

## 2021-12-23 NOTE — Patient Instructions (Addendum)
Continue on Symbicort 2 puffs Twice daily  ,use with spacer. Rinse mouth after use Continue on Singulair '10mg'$  daily  Continue on Xyzal '5mg'$  1/2 At bedtime   Albuterol inhaler As needed   Activity as tolerated.  Keep up the great work with CPAP, consider mask liner to decrease leakage with side sleeping

## 2022-01-04 ENCOUNTER — Ambulatory Visit: Payer: Medicare Other | Admitting: Pharmacist

## 2022-01-04 DIAGNOSIS — E785 Hyperlipidemia, unspecified: Secondary | ICD-10-CM | POA: Diagnosis not present

## 2022-01-04 MED ORDER — EZETIMIBE 10 MG PO TABS: 10.0000 mg | ORAL_TABLET | Freq: Every day | ORAL | 3 refills | Status: DC

## 2022-01-04 NOTE — Patient Instructions (Addendum)
Start ezetimibe 10 mg daily Follow up for labs in 2 months

## 2022-01-04 NOTE — Progress Notes (Unsigned)
Patient ID: Glenda Bauer                 DOB: [birth year]                    MRN: [id number]     HPI: Glenda Bauer is a 71 y.o. female patient referred to lipid clinic by Dr. Johney Frame. PMH is significant for HTN, cutaneous T-cell lymphoma, ascending aortic aneurysm, and paroxysmal atrial fibrillation. Cardiac CTR January 2023 noted severe (>70%) plaque in the LAD with total CAC score of 543, placing her in the 92st percentile. Patient was last seen by Dr. Johney Frame on 11/27/21 where she reported myalgias on Crestor and was changed to Lipitor 20 mg and referred to lipid clinic for consideration of PCSK9i.   Pt presents today in good spirits. Pt denies any chest pain, palpitations, headache, or fatigue. When providing history of statin intolerance, pt reported experiencing myalgias, dizziness, fatigue, and nausea promptly after starting rosuvastatin that decreased somewhat after several months. She continued Crestor, however it began to interfere with her ability to exercise and her comfort with ADLs. Symptoms stopped within a few days after stopping Crestor. Pt reported taking atorvastatin for 3 days after it was prescribed by Dr. Johney Frame, however, she experienced the same symptoms as Crestor but more intense. Pt stopped taking atorvastatin after 3 days of treatment. Baseline LDL is 99. Pt noted today that she received a letter from Medicare last week stating that she has entered the donut hole. She reports that it will be difficult to afford another branded medication in addition to her Xarelto.    Current Medications: none Intolerances: rosuvastatin 10 mg daily, atorvastatin 20 mg daily, simvastatin 10 mg daily (myalgias, dizziness, fatigue, nausea) Risk Factors: CAD on cardiac CT, MESA score 7.8% LDL goal: <70 mg/dL  Diet: Reports trying to follow mediterranean diet and only strays from it 2 times per week.   Exercise: Reports walking 2 miles per day at moderate intensity. Reports  previously walking 5 miles per day before her myopathy became unbearable.  Family History:  Family History  Problem Relation Age of Onset   Arthritis Mother    Ovarian cancer Mother        Metastatic   Breast cancer Mother 57   Thyroid disease Mother    Parkinson's disease Father    Crohn's disease Brother    Schizophrenia Brother    Colon cancer Neg Hx    Esophageal cancer Neg Hx    Rectal cancer Neg Hx    Stomach cancer Neg Hx     Social History:  Social History   Socioeconomic History   Marital status: Divorced    Spouse name: Not on file   Number of children: 3   Years of education: 16   Highest education level: Associate degree: academic program  Occupational History   Occupation: Medical sales representative   Tobacco Use   Smoking status: Former    Packs/day: 0.25    Years: 10.00    Total pack years: 2.50    Types: Cigarettes    Quit date: 06/07/1981    Years since quitting: 40.6    Passive exposure: Past   Smokeless tobacco: Never   Tobacco comments:    Former smoker 12/02/21  Vaping Use   Vaping Use: Never used  Substance and Sexual Activity   Alcohol use: No    Alcohol/week: 0.0 standard drinks of alcohol   Drug use: No   Sexual activity: Not Currently  Birth control/protection: Post-menopausal  Other Topics Concern   Not on file  Social History Narrative   Separated    Three children ( one lives locally)    Social Determinants of Health   Financial Resource Strain: Low Risk  (10/08/2021)   Overall Financial Resource Strain (CARDIA)    Difficulty of Paying Living Expenses: Not very hard  Food Insecurity: No Food Insecurity (10/08/2021)   Hunger Vital Sign    Worried About Running Out of Food in the Last Year: Never true    Ran Out of Food in the Last Year: Never true  Transportation Needs: No Transportation Needs (10/08/2021)   PRAPARE - Hydrologist (Medical): No    Lack of Transportation (Non-Medical): No  Physical Activity:  Insufficiently Active (10/08/2021)   Exercise Vital Sign    Days of Exercise per Week: 3 days    Minutes of Exercise per Session: 20 min  Stress: Stress Concern Present (10/08/2021)   [place]    Feeling of Stress : To some extent  Social Connections: Socially Isolated (10/08/2021)   Social Connection and Isolation Panel [NHANES]    Frequency of Communication with Friends and Family: More than three times a week    Frequency of Social Gatherings with Friends and Family: More than three times a week    Attends Religious Services: Never    Marine scientist or Organizations: No    Attends Archivist Meetings: Never    Marital Status: Divorced  Human resources officer Violence: Not At Risk (10/08/2021)   Humiliation, Afraid, Rape, and Kick questionnaire    Fear of Current or Ex-Partner: No    Emotionally Abused: No    Physically Abused: No    Sexually Abused: No    Labs: 05/08/21 Lipid Panel on Crestor 10 mg TC 145, TG 79, HDL 68.8, VLDL 15.8, LDL 61, NonHDL 76.52  Past Medical History:  Diagnosis Date   Allergy    Anxiety    Asthma    Atrial fibrillation ([place])    Cancer ([place])    Melanoma and Mycosis Fungosis   Cataract    Cutaneous T-cell lymphoma ([place]) 2011   Dermatologist treated in [place]; Dr. Hipolito Bayley   Depression    GERD (gastroesophageal reflux disease)    Heart murmur    Hypertension    Neuromuscular disorder ([place])    Osteoarthritis    Sebaceous cyst 2022   right vulva   Thyroid nodule    Urinary tract infection     Current Outpatient Medications on File Prior to Visit  Medication Sig Dispense Refill   albuterol (VENTOLIN HFA) 108 (90 Base) MCG/ACT inhaler INHALE TWO PUFFS BY MOUTH INTO LUNGS EVERY 6 HOURS AS NEEDED FOR SHORTNESS OF BREATH OR FOR WHEEZING 8.5 g 1   ALPRAZolam (XANAX) 0.25 MG tablet TAKE 1 TO 2 TABLETS BY MOUTH ONCE daily AS NEEDED 30 tablet 2   atorvastatin (LIPITOR) 20  MG tablet Take 1 tablet (20 mg total) by mouth daily. 90 tablet 1   budesonide-formoterol (SYMBICORT) 160-4.5 MCG/ACT inhaler Inhale 2 puffs into the lungs 2 (two) times daily. 1 each 11   famotidine (PEPCID) 20 MG tablet Take 20 mg by mouth daily.     FLUoxetine (PROZAC) 20 MG capsule TAKE ONE CAPSULE BY MOUTH ONCE DAILY 90 capsule 1   fluticasone (FLONASE) 50 MCG/ACT nasal spray Place 2 sprays into both nostrils as needed.  levalbuterol (XOPENEX) 0.63 MG/3ML nebulizer solution Take 3 mLs (0.63 mg total) by nebulization every 6 (six) hours as needed for wheezing or shortness of breath. 225 mL 6   levocetirizine (XYZAL) 5 MG tablet Take 5 mg by mouth as needed for allergies.     lisinopril-hydrochlorothiazide (ZESTORETIC) 20-12.5 MG tablet Take 2 tablets by mouth daily. 180 tablet 1   metoprolol succinate (TOPROL-XL) 25 MG 24 hr tablet Take 1 tablet (25 mg total) by mouth daily. Take with or immediately following a meal. 90 tablet 1   montelukast (SINGULAIR) 10 MG tablet TAKE ONE TABLET BY MOUTH EVERYDAY AT BEDTIME 90 tablet 3   Multiple Vitamin (MULTIVITAMIN) tablet Take 1 tablet by mouth daily.     Multiple Vitamins-Minerals (VISION FORMULA PO) Take by mouth daily.      Omega-3 Fatty Acids (FISH OIL PO) Take 1 capsule by mouth daily.     triamcinolone cream (KENALOG) 0.1 % APPLY TOPICALLY TWICE DAILY 454 g 0   XARELTO 20 MG TABS tablet TAKE ONE TABLET BY MOUTH EVERY EVENING 90 tablet 3   No current facility-administered medications on file prior to visit.    No Known Allergies  Assessment/Plan:  1. Hyperlipidemia - LDL of 61 on rosuvastatin 10 mg was at goal of <70 mg/dL, however pt is no longer on any anti-lipid medication. Discussed with patient the options to try pravastatin, however pt is not interested in trialing another statin at this time given her experience of severe symptoms on 3 statins. The patient is interested in Repatha 140 mg every 14 days, however feels she will be  unable to afford it currently since she has entered the donut hole. Pt is willing to start Zetia 10 mg daily and check labs in 2 months. Pt is unlikely to achieve goal LDL on Zetia monotherapy. May need to discuss trialing pravastatin again if labs show LDL above goal. Pt was educated on healthy diet and exercise. Will go ahead and submit Repatha PA to determine the cost. Pt may qualify for [employer] safety net foundation to aid with affordability in the donut hole. PT was educated on Repatha administration, storage, and adverse effects. Follow up for Lipid/hepatic panels and ApoB in 2 months.  Thanks,  Reatha Harps, PharmD PGY2 Cardiology Pharmacy Resident 01/04/2022 3:22 PM

## 2022-01-05 ENCOUNTER — Telehealth: Payer: Self-pay | Admitting: Pharmacist

## 2022-01-05 DIAGNOSIS — E785 Hyperlipidemia, unspecified: Secondary | ICD-10-CM | POA: Insufficient documentation

## 2022-01-05 NOTE — Telephone Encounter (Signed)
Called pt to see if she was interested in apply for SNF for Repatha. LVM for pt to call back- Called pt 7/31

## 2022-01-06 ENCOUNTER — Telehealth: Payer: Self-pay | Admitting: Pharmacist

## 2022-01-06 NOTE — Chronic Care Management (AMB) (Addendum)
Chronic Care Management Pharmacy Assistant   Name: Teckla Christiansen  MRN: [id number] DOB: 01-06-1951  Reason for Encounter: Medication Review Medication Coordination  Recent office visits:  None  Recent consult visits:  01/04/22 Ursula Beath, [facility] ([place]) - Patient presented for HLD. Prescribed Ezetimibe 10 mg. Stopped Atorvastatin 20 mg.   12/23/21 [name], MD ([place]) - Patient presented for moderate persistent asthma without complication and other concerns. No medication changes.  12/09/21 Patient presented to [facility] for ECG.  Hospital visits:  None in previous 6 months  Medications: Outpatient Encounter Medications as of 01/06/2022  Medication Sig   albuterol (VENTOLIN HFA) 108 (90 Base) MCG/ACT inhaler INHALE TWO PUFFS BY MOUTH INTO LUNGS EVERY 6 HOURS AS NEEDED FOR SHORTNESS OF BREATH OR FOR WHEEZING   ALPRAZolam (XANAX) 0.25 MG tablet TAKE 1 TO 2 TABLETS BY MOUTH ONCE daily AS NEEDED   budesonide-formoterol (SYMBICORT) 160-4.5 MCG/ACT inhaler Inhale 2 puffs into the lungs 2 (two) times daily.   ezetimibe (ZETIA) 10 MG tablet Take 1 tablet (10 mg total) by mouth daily.   famotidine (PEPCID) 20 MG tablet Take 20 mg by mouth daily.   FLUoxetine (PROZAC) 20 MG capsule TAKE ONE CAPSULE BY MOUTH ONCE DAILY   fluticasone (FLONASE) 50 MCG/ACT nasal spray Place 2 sprays into both nostrils as needed.   levalbuterol (XOPENEX) 0.63 MG/3ML nebulizer solution Take 3 mLs (0.63 mg total) by nebulization every 6 (six) hours as needed for wheezing or shortness of breath.   levocetirizine (XYZAL) 5 MG tablet Take 5 mg by mouth as needed for allergies.   lisinopril-hydrochlorothiazide (ZESTORETIC) 20-12.5 MG tablet Take 2 tablets by mouth daily.   metoprolol succinate (TOPROL-XL) 25 MG 24 hr tablet Take 1 tablet (25 mg total) by mouth daily. Take with or immediately following a meal.   montelukast (SINGULAIR) 10 MG tablet TAKE ONE TABLET BY MOUTH  EVERYDAY AT BEDTIME   Multiple Vitamin (MULTIVITAMIN) tablet Take 1 tablet by mouth daily.   Multiple Vitamins-Minerals (VISION FORMULA PO) Take by mouth daily.    Omega-3 Fatty Acids (FISH OIL PO) Take 1 capsule by mouth daily.   triamcinolone cream (KENALOG) 0.1 % APPLY TOPICALLY TWICE DAILY   XARELTO 20 MG TABS tablet TAKE ONE TABLET BY MOUTH EVERY EVENING   No facility-administered encounter medications on file as of 01/06/2022.   Reviewed chart for medication changes ahead of medication coordination call.   BP Readings from Last 3 Encounters:  12/23/21 (!) 210/64  12/02/21 110/70  11/27/21 108/68    Lab Results  Component Value Date   HGBA1C 5.6 05/08/2021    Reviewed chart prior to disease state call. Spoke with patient regarding BP  Recent Office Vitals: BP Readings from Last 3 Encounters:  12/23/21 (!) 210/64  12/02/21 110/70  11/27/21 108/68   Pulse Readings from Last 3 Encounters:  12/23/21 (!) 53  12/02/21 (!) 56  11/27/21 (!) 57    Wt Readings from Last 3 Encounters:  12/23/21 180 lb 9.6 oz (81.9 kg)  12/02/21 183 lb 3.2 oz (83.1 kg)  11/27/21 184 lb 9.6 oz (83.7 kg)     Kidney Function Lab Results  Component Value Date/Time   CREATININE 0.99 11/20/2021 08:47 AM   CREATININE 0.94 05/25/2021 09:46 AM   CREATININE 0.89 03/27/2020 09:17 AM   GFR 62.30 05/08/2021 09:34 AM   GFRNONAA 59 (L) 05/21/2020 11:21 AM   GFRNONAA >60 04/17/2020 12:30 PM   GFRNONAA 65 03/27/2020 09:17  AM   GFRAA 68 05/21/2020 11:21 AM   GFRAA 76 03/27/2020 09:17 AM       Latest Ref Rng & Units 11/20/2021    8:47 AM 05/25/2021    9:46 AM 05/08/2021    9:34 AM  BMP  Glucose 70 - 99 mg/dL 98  107  99   BUN 8 - 27 mg/dL '23  28  20   '$ Creatinine 0.57 - 1.00 mg/dL 0.99  0.94  0.92   BUN/Creat Ratio 12 - '28 23  30    '$ Sodium 134 - 144 mmol/L 140  142  140   Potassium 3.5 - 5.2 mmol/L 3.7  3.7  4.0   Chloride 96 - 106 mmol/L 103  103  104   CO2 20 - 29 mmol/L '24  23  28   '$ Calcium  8.7 - 10.3 mg/dL 9.2  9.5  10.1     Current antihypertensive regimen:  Lisinopril - HCTZ 20-12.5 2 tablets daily  Metoprolol succinate 25 mg 1 tablet daily How often are you checking your Blood Pressure? weekly Current home BP readings: 110/70 patient reports some dizziness and lightheadedness at times, pressures remain under 120 on top she reports she has discussed this with her provider and was advised the Xarelto may be causing.  Adherence Review: Is the patient currently on ACE/ARB medication? Yes Does the patient have >5 day gap between last estimated fill dates? No   Patient obtains medications through Adherence Packaging  30 Days   Last adherence delivery included: Montelukast (Singulair) 10 mg - Take one tablet at Bedtime Rivaroxaban (Xarelto) 20 mg - Take one tablet at Evening Meal Metropolol Succinate (Toprolol-XL) 25 mg - Take one tablet at Breakfast Fluoxetine (Prozac) 20 mg - Take one capsule at Breakfast Lisinopril-HCTZ (Zestoric) 20-12.5 mg - Take two tablets at Breakfast Alprazolam (Xanax) - Take 1-2 tablets per day as needed Albuterol HFA inhaler - use as needed.     Patient reports she would like Atorvastatin before fill date and has been removing Rosuvastatin from packs. She further reports she is to follow up with this prescriber on 12/22/21   Confirmed delivery date of 12/17/21, advised patient that pharmacy will contact them the morning of delivery.   Patient is due for next adherence delivery on: 01/18/22. Called patient and reviewed medications and coordinated delivery. Packs 30 DS  This delivery to include: Ezetimibe (Zetia) 10 mg: Take one tab daily at breakfast Montelukast (Singulair) 10 mg - Take one tablet at Bedtime Metropolol Succinate (Toprolol-XL) 25 mg - Take one tablet at Breakfast Fluoxetine (Prozac) 20 mg - Take one capsule at Breakfast Lisinopril-HCTZ (Zestoric) 20-12.5 mg - Take two tablets at Breakfast Alprazolam (Xanax) - Take 1-2 tablets  per day as needed   Patient declined the following medications  Atorvastatin 20 mg - Take one tablet at Breakfast ( patient reports not taking is in process of PAP for repatha with another office) Albuterol HFA inhaler - use as needed. ( Does not need this month) Rivaroxaban (Xarelto) 20 mg -  will provide samples in the office for 1 month   Confirmed delivery date of 01/18/22, advised patient that pharmacy will contact them the morning of delivery.   Care Gaps: COVID Booster - Overdue Flu Vaccine - Overdue Zoster Vaccine - Postponed PNA Vaccine - Postponed Mammogram - Postponed TDAP - Postponed CCM- 01/22/22 AWV- 5/23  Star Rating Drugs: Atorvastatin 20 MG - Last filled 12/10/21 30 DS at [facility] Lisinopril- HCTZ 20/12.5 mg - Last  filled 12/10/21 30 DS at [facility] Pharmacist Assistant [phone]

## 2022-01-08 NOTE — Telephone Encounter (Signed)
Called and spoke with patient. She is interested in applying to pt assistance for Repatha. She has an apt on Tue w/ Dr. Curt Bears. I will leave the pt application for her to fill out. She knows we need proof of income.

## 2022-01-11 ENCOUNTER — Other Ambulatory Visit: Payer: Self-pay | Admitting: *Deleted

## 2022-01-11 DIAGNOSIS — I4891 Unspecified atrial fibrillation: Secondary | ICD-10-CM

## 2022-01-11 DIAGNOSIS — Z0000 Encounter for general adult medical examination without abnormal findings: Secondary | ICD-10-CM

## 2022-01-11 DIAGNOSIS — I10 Essential (primary) hypertension: Secondary | ICD-10-CM

## 2022-01-11 DIAGNOSIS — J4540 Moderate persistent asthma, uncomplicated: Secondary | ICD-10-CM

## 2022-01-11 MED ORDER — RIVAROXABAN 20 MG PO TABS: 20.0000 mg | ORAL_TABLET | Freq: Every evening | ORAL | 0 refills | Status: DC

## 2022-01-11 NOTE — Progress Notes (Signed)
Per [name] call to patient to advise she may pick up a month supply of Xarelto samples from the office and also gave her the number to contact Ranelle Oyster for the Patient assistance program that will limit her co-pay to 85 dollars a month if she qualifies. Patient was in agreement will pick up samples on Wed and call Ranelle Oyster at [place] [place] Pharmacist Assistant [phone]

## 2022-01-12 ENCOUNTER — Ambulatory Visit: Payer: Medicare Other | Admitting: Cardiology

## 2022-01-12 ENCOUNTER — Encounter: Payer: Self-pay | Admitting: Cardiology

## 2022-01-12 VITALS — BP 110/74 | HR 59 | Ht 66.0 in | Wt 181.2 lb

## 2022-01-12 DIAGNOSIS — I480 Paroxysmal atrial fibrillation: Secondary | ICD-10-CM

## 2022-01-12 DIAGNOSIS — D6869 Other thrombophilia: Secondary | ICD-10-CM

## 2022-01-12 NOTE — Patient Instructions (Addendum)
Medication Instructions:  Your physician recommends that you continue on your current medications as directed. Please refer to the Current Medication list given to you today.  Labwork: None ordered.  Testing/Procedures: Your physician has recommended that you have an ablation. Catheter ablation is a medical procedure used to treat some cardiac arrhythmias (irregular heartbeats). During catheter ablation, a long, thin, flexible tube is put into a blood vessel in your groin (upper thigh), or neck. This tube is called an ablation catheter. It is then guided to your heart through the blood vessel. Radio frequency waves destroy small areas of heart tissue where abnormal heartbeats may cause an arrhythmia to start. Please see the instruction sheet given to you today.  Follow-Up:  SEE INSTRUCTION LETTER  Cardiac Ablation Cardiac ablation is a procedure to destroy, or ablate, a small amount of heart tissue in very specific places. The heart has many electrical connections. Sometimes these connections are abnormal and can cause the heart to beat very fast or irregularly. Ablating some of the areas that cause problems can improve the heart's rhythm or return it to normal. Ablation may be done for people who: Have Wolff-Parkinson-White syndrome. Have fast heart rhythms (tachycardia). Have taken medicines for an abnormal heart rhythm (arrhythmia) that were not effective or caused side effects. Have a high-risk heartbeat that may be life-threatening. During the procedure, a small incision is made in the neck or the groin, and a long, thin tube (catheter) is inserted into the incision and moved to the heart. Small devices (electrodes) on the tip of the catheter will send out electrical currents. A type of X-ray (fluoroscopy) will be used to help guide the catheter and to provide images of the heart. Tell a health care provider about: Any allergies you have. All medicines you are taking, including vitamins,  herbs, eye drops, creams, and over-the-counter medicines. Any problems you or family members have had with anesthetic medicines. Any blood disorders you have. Any surgeries you have had. Any medical conditions you have, such as kidney failure. Whether you are pregnant or may be pregnant. What are the risks? Generally, this is a safe procedure. However, problems may occur, including: Infection. Bruising and bleeding at the catheter insertion site. Bleeding into the chest, especially into the sac that surrounds the heart. This is a serious complication. Stroke or blood clots. Damage to nearby structures or organs. Allergic reaction to medicines or dyes. Need for a permanent pacemaker if the normal electrical system is damaged. A pacemaker is a small computer that sends electrical signals to the heart and helps your heart beat normally. The procedure not being fully effective. This may not be recognized until months later. Repeat ablation procedures are sometimes done. What happens before the procedure? Medicines Ask your health care provider about: Changing or stopping your regular medicines. This is especially important if you are taking diabetes medicines or blood thinners. Taking medicines such as aspirin and ibuprofen. These medicines can thin your blood. Do not take these medicines unless your health care provider tells you to take them. Taking over-the-counter medicines, vitamins, herbs, and supplements. General instructions Follow instructions from your health care provider about eating or drinking restrictions. Plan to have someone take you home from the hospital or clinic. If you will be going home right after the procedure, plan to have someone with you for 24 hours. Ask your health care provider what steps will be taken to prevent infection. What happens during the procedure?  An IV will be inserted into  one of your veins. You will be given a medicine to help you relax  (sedative). The skin on your neck or groin will be numbed. An incision will be made in your neck or your groin. A needle will be inserted through the incision and into a large vein in your neck or groin. A catheter will be inserted into the needle and moved to your heart. Dye may be injected through the catheter to help your surgeon see the area of the heart that needs treatment. Electrical currents will be sent from the catheter to ablate heart tissue in desired areas. There are three types of energy that may be used to do this: Heat (radiofrequency energy). Laser energy. Extreme cold (cryoablation). When the tissue has been ablated, the catheter will be removed. Pressure will be held on the insertion area to prevent a lot of bleeding. A bandage (dressing) will be placed over the insertion area. The exact procedure may vary among health care providers and hospitals. What happens after the procedure? Your blood pressure, heart rate, breathing rate, and blood oxygen level will be monitored until you leave the hospital or clinic. Your insertion area will be monitored for bleeding. You will need to lie still for a few hours to ensure that you do not bleed from the insertion area. Do not drive for 24 hours or as long as told by your health care provider. Summary Cardiac ablation is a procedure to destroy, or ablate, a small amount of heart tissue using an electrical current. This procedure can improve the heart rhythm or return it to normal. Tell your health care provider about any medical conditions you may have and all medicines you are taking to treat them. This is a safe procedure, but problems may occur. Problems may include infection, bruising, damage to nearby organs or structures, or allergic reactions to medicines. Follow your health care provider's instructions about eating and drinking before the procedure. You may also be told to change or stop some of your medicines. After the  procedure, do not drive for 24 hours or as long as told by your health care provider. This information is not intended to replace advice given to you by your health care provider. Make sure you discuss any questions you have with your health care provider. Document Revised: 08/14/2021 Document Reviewed: 04/02/2019 Elsevier Patient Education  [place].

## 2022-01-12 NOTE — Progress Notes (Signed)
Electrophysiology Office Note   Date:  01/12/2022   ID:  [name], DOB 03-Mar-1951, MRN [id number]  PCP:  [name], NP  Cardiologist:  Johney Frame Primary Electrophysiologist:  Mckenley Birenbaum Meredith Leeds, MD    Chief Complaint: AF   History of Present Illness: Glenda Bauer is a 71 y.o. female who is being seen today for the evaluation of AF at the request of Fenton, Clint R, PA. Presenting today for electrophysiology evaluation.  She has a history seen for hypertension, coronary artery disease, cutaneous T-cell lymphoma, atrial fibrillation.  She was diagnosed with atrial fibrillation in 2021.  She has been having more frequent palpitations.  She is currently on Xarelto.  Her symptoms are fatigue, dyspnea on exertion.  Coronary CT shows nonobstructive coronary artery disease and thus flecainide was discontinued.   Today, she denies symptoms of palpitations, chest pain, shortness of breath, orthopnea, PND, lower extremity edema, claudication, dizziness, presyncope, syncope, bleeding, or neurologic sequela. The patient is tolerating medications without difficulties.    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 Hipolito Bayley   Depression    GERD (gastroesophageal reflux disease)    Heart murmur    Hyperlipidemia    Hypertension    Neuromuscular disorder (HCC)    Osteoarthritis    Sebaceous cyst 2022   right vulva   Thyroid nodule    Urinary tract infection    Past Surgical History:  Procedure Laterality Date   Cataracts Bilateral    CHOLECYSTECTOMY  2015   melenoma removal  1979   Sinus Polyps        Current Outpatient Medications  Medication Sig Dispense Refill   albuterol (VENTOLIN HFA) 108 (90 Base) MCG/ACT inhaler INHALE TWO PUFFS BY MOUTH INTO LUNGS  EVERY 6 HOURS AS NEEDED FOR SHORTNESS OF BREATH OR FOR WHEEZING 8.5 g 1   ALPRAZolam (XANAX) 0.25 MG tablet TAKE 1 TO 2 TABLETS BY MOUTH ONCE daily AS NEEDED 30 tablet 2   budesonide-formoterol (SYMBICORT) 160-4.5 MCG/ACT inhaler Inhale 2 puffs into the lungs 2 (two) times daily. 1 each 11   ezetimibe (ZETIA) 10 MG tablet Take 1 tablet (10 mg total) by mouth daily. 90 tablet 3   famotidine (PEPCID) 20 MG tablet Take 20 mg by mouth daily.     FLUoxetine (PROZAC) 20 MG capsule TAKE ONE CAPSULE BY MOUTH ONCE DAILY 90 capsule 1   fluticasone (FLONASE) 50 MCG/ACT nasal spray Place 2 sprays into both nostrils as needed.     levalbuterol (XOPENEX) 0.63 MG/3ML nebulizer solution Take 3 mLs (0.63 mg total) by nebulization every 6 (six) hours as needed for wheezing or shortness of breath. 225 mL 6   levocetirizine (XYZAL) 5 MG tablet Take 5 mg by mouth as needed for allergies.     lisinopril-hydrochlorothiazide (ZESTORETIC) 20-12.5 MG tablet Take 2 tablets by mouth daily. 180 tablet 1   metoprolol succinate (TOPROL-XL) 25 MG 24 hr tablet Take 1 tablet (25 mg total) by mouth daily. Take with or immediately following a meal. 90 tablet 1   montelukast (SINGULAIR) 10 MG tablet TAKE ONE TABLET BY MOUTH EVERYDAY AT BEDTIME 90 tablet 3   Multiple Vitamin (MULTIVITAMIN) tablet Take 1 tablet by mouth daily.     Multiple Vitamins-Minerals (  VISION FORMULA PO) Take by mouth daily.      Omega-3 Fatty Acids (FISH OIL PO) Take 1 capsule by mouth daily.     rivaroxaban (XARELTO) 20 MG TABS tablet Take 1 tablet (20 mg total) by mouth every evening. 28 tablet 0   triamcinolone cream (KENALOG) 0.1 % APPLY TOPICALLY TWICE DAILY 454 g 0   No current facility-administered medications for this visit.    Allergies:   Patient has no known allergies.   Social History:  The patient  reports that she quit smoking about 40 years ago. Her smoking use included cigarettes. She has a 2.50 pack-year smoking history. She has been exposed  to tobacco smoke. She has never used smokeless tobacco. She reports that she does not drink alcohol and does not use drugs.   Family History:  The patient's family history includes Arthritis in her mother; Breast cancer (age of onset: 9) in her mother; Crohn's disease in her brother; Ovarian cancer in her mother; Parkinson's disease in her father; Schizophrenia in her brother; Thyroid disease in her mother.    ROS:  Please see the history of present illness.   Otherwise, review of systems is positive for none.   All other systems are reviewed and negative.    PHYSICAL EXAM: VS:  BP 110/74   Pulse (!) 59   Ht '5\' 6"'$  (1.676 m)   Wt 181 lb 3.2 oz (82.2 kg)   LMP 06/07/2000 (Approximate)   SpO2 96%   BMI 29.25 kg/m  , BMI Body mass index is 29.25 kg/m. GEN: Well nourished, well developed, in no acute distress  HEENT: normal  Neck: no JVD, carotid bruits, or masses Cardiac: RRR; no murmurs, rubs, or gallops,no edema  Respiratory:  clear to auscultation bilaterally, normal work of breathing GI: soft, nontender, nondistended, + BS MS: no deformity or atrophy  Skin: warm and dry Neuro:  Strength and sensation are intact Psych: euthymic mood, full affect  EKG:  EKG is not ordered today. Personal review of the ekg ordered 12/02/21 shows sinus rhythm, rate 56  Recent Labs: 05/08/2021: ALT 29; Hemoglobin 14.4; Platelets 229.0; TSH 0.86 11/20/2021: BUN 23; Creatinine, Ser 0.99; Potassium 3.7; Sodium 140    Lipid Panel     Component Value Date/Time   CHOL 145 05/08/2021 0934   CHOL 122 01/09/2021 0911   TRIG 79.0 05/08/2021 0934   HDL 68.80 05/08/2021 0934   HDL 65 01/09/2021 0911   CHOLHDL 2 05/08/2021 0934   VLDL 15.8 05/08/2021 0934   LDLCALC 61 05/08/2021 0934   LDLCALC 45 01/09/2021 0911   LDLCALC 110 (H) 03/27/2020 0917     Wt Readings from Last 3 Encounters:  01/12/22 181 lb 3.2 oz (82.2 kg)  12/23/21 180 lb 9.6 oz (81.9 kg)  12/02/21 183 lb 3.2 oz (83.1 kg)       Other studies Reviewed: Additional studies/ records that were reviewed today include: TTE 12/09/21  Review of the above records today demonstrates:   1. Left ventricular ejection fraction, by estimation, is 60 to 65%. The  left ventricle has normal function. The left ventricle has no regional  wall motion abnormalities. Left ventricular diastolic parameters are  consistent with Grade I diastolic  dysfunction (impaired relaxation).   2. Right ventricular systolic function is normal. The right ventricular  size is normal.   3. The mitral valve is grossly normal. Mild mitral valve regurgitation.  No evidence of mitral stenosis.   4. The aortic valve is tricuspid. There  is mild calcification of the  aortic valve. There is mild thickening of the aortic valve. Aortic valve  regurgitation is mild to moderate. Aortic valve sclerosis/calcification is  present, without any evidence of  aortic stenosis.   5. Aortic dilatation noted. There is mild-to-moderate dilatation of the  ascending aorta, measuring 45 mm. There is borderline dilatation of the  aortic arch, measuring 39 mm.   6. The inferior vena cava is normal in size with greater than 50%  respiratory variability, suggesting right atrial pressure of 3 mmHg.   Coronary CTA 1. Coronary calcium score of 543. This was 91st percentile for age-, race-, and sex-matched controls.   2. Normal coronary origin with right dominance.   3.  Ascending aorta aneurysm (4.4cm.)   4. Severe (>70%) soft plaque in the mid LAD. Minimal (<25%) plaque in LM and proximal LAD. CAD-RADS 4.  1.  FFRct findings are consistent with non-obstructive disease.   2. Recommend aggressive risk factor modification, including LDL goal <70.  ASSESSMENT AND PLAN:  1.  Paroxysmal atrial fibrillation: CHA2DS2-VASc of 4.  Currently on Xarelto 20 mg daily, Toprol-XL 25 mg daily.  She would benefit from rhythm control.  We discussed medication management including Multaq  and dofetilide.  She would prefer to avoid medications.  Due to that, we Froilan Mclean plan for ablation.  Risk, benefits, and alternatives to EP study and radiofrequency ablation for afib were also discussed in detail today. These risks include but are not limited to stroke, bleeding, vascular damage, tamponade, perforation, damage to the esophagus, lungs, and other structures, pulmonary vein stenosis, worsening renal function, and death. The patient understands these risk and wishes to proceed.  We Maiana Hennigan therefore proceed with catheter ablation at the next available time.  Carto, ICE, anesthesia are requested for the procedure.  Dashan Chizmar also obtain CT PV protocol prior to the procedure to exclude LAA thrombus and further evaluate atrial anatomy.   2.  Secondary hypercoagulable state: Currently on Xarelto for atrial fibrillation as above  3.  Hypertension: well controlled  4.  Coronary artery disease: Elevated calcium score with moderate disease.  No current chest pain.  Plan per primary cardiology.  6.  Hyperlipidemia: Continue Zetia per primary cardiology    Current medicines are reviewed at length with the patient today.   The patient does not have concerns regarding her medicines.  The following changes were made today:  none  Labs/ tests ordered today include:  Orders Placed This Encounter  Procedures   CT CARDIAC MORPH/PULM VEIN W/CM&W/O CA SCORE   Basic Metabolic Panel (BMET)   CBC w/Diff     Disposition:   FU with Enora Trillo 3 months  Signed, Durga Saldarriaga Meredith Leeds, MD  01/12/2022 9:25 AM     [facility] [address] [place] [address] [phone] (office) [phone] (fax)

## 2022-01-17 DIAGNOSIS — G4733 Obstructive sleep apnea (adult) (pediatric): Secondary | ICD-10-CM | POA: Diagnosis not present

## 2022-01-21 ENCOUNTER — Telehealth: Payer: Self-pay | Admitting: Pharmacist

## 2022-01-21 NOTE — Chronic Care Management (AMB) (Signed)
    Chronic Care Management Pharmacy Assistant   Name: Glenda Bauer  MRN: [id number] DOB: July 06, 1950  01/21/22 APPOINTMENT REMINDER    Patient was reminded to have all medications, supplements and any blood glucose and blood pressure readings available for review with [name], Pharm. D, for telephone visit on 01/22/22 at 8:30.    Care Gaps: Zoster Vaccine - Overdue COVID Booster - Overdue Flu Vaccine - Overdue PNA Vaccine - Postponed Mammogram - Postponed  TDAP - Postponed  Star Rating Drug: Atorvastatin 20 MG - Last filled 12/16/21 30 DS at [facility] Lisinopril- HCTZ 20/12.5 mg - Last filled 12/10/21 30 DS at [facility]   Medications: Outpatient Encounter Medications as of 01/21/2022  Medication Sig   albuterol (VENTOLIN HFA) 108 (90 Base) MCG/ACT inhaler INHALE TWO PUFFS BY MOUTH INTO LUNGS EVERY 6 HOURS AS NEEDED FOR SHORTNESS OF BREATH OR FOR WHEEZING   ALPRAZolam (XANAX) 0.25 MG tablet TAKE 1 TO 2 TABLETS BY MOUTH ONCE daily AS NEEDED   budesonide-formoterol (SYMBICORT) 160-4.5 MCG/ACT inhaler Inhale 2 puffs into the lungs 2 (two) times daily.   ezetimibe (ZETIA) 10 MG tablet Take 1 tablet (10 mg total) by mouth daily.   famotidine (PEPCID) 20 MG tablet Take 20 mg by mouth daily.   FLUoxetine (PROZAC) 20 MG capsule TAKE ONE CAPSULE BY MOUTH ONCE DAILY   fluticasone (FLONASE) 50 MCG/ACT nasal spray Place 2 sprays into both nostrils as needed.   levalbuterol (XOPENEX) 0.63 MG/3ML nebulizer solution Take 3 mLs (0.63 mg total) by nebulization every 6 (six) hours as needed for wheezing or shortness of breath.   levocetirizine (XYZAL) 5 MG tablet Take 5 mg by mouth as needed for allergies.   lisinopril-hydrochlorothiazide (ZESTORETIC) 20-12.5 MG tablet Take 2 tablets by mouth daily.   metoprolol succinate (TOPROL-XL) 25 MG 24 hr tablet Take 1 tablet (25 mg total) by mouth daily. Take with or immediately following a meal.   montelukast (SINGULAIR) 10 MG tablet TAKE ONE  TABLET BY MOUTH EVERYDAY AT BEDTIME   Multiple Vitamin (MULTIVITAMIN) tablet Take 1 tablet by mouth daily.   Multiple Vitamins-Minerals (VISION FORMULA PO) Take by mouth daily.    Omega-3 Fatty Acids (FISH OIL PO) Take 1 capsule by mouth daily.   rivaroxaban (XARELTO) 20 MG TABS tablet Take 1 tablet (20 mg total) by mouth every evening.   triamcinolone cream (KENALOG) 0.1 % APPLY TOPICALLY TWICE DAILY   No facility-administered encounter medications on file as of 01/21/2022.     [place] Clinical Pharmacist Assistant [phone]

## 2022-01-21 NOTE — Progress Notes (Unsigned)
Chronic Care Management Pharmacy Note  01/28/2022 Name:  Melisa Donofrio MRN:  [id number] DOB:  December 25, 1950  Summary: BP is at goal < 140/90 per home readings Pt is having difficulty affording some medications LDL not at goal < 70   Recommendations/Changes made from today's visit: -Recommended Symbicort for maintenance and reliever inhaler -Provided information for applying to [place] select program for Xarelto -Recommend repeat lipid panel   Plan: Follow up BP assessment in 3 months   Subjective: Kaly Mcquary is an 71 y.o. year old female who is a primary patient of Shirline Frees, NP.  The CCM team was consulted for assistance with disease management and care coordination needs.    Engaged with patient by telephone for follow up visit in response to provider referral for pharmacy case management and/or care coordination services.   Consent to Services:  The patient was given information about Chronic Care Management services, agreed to services, and gave verbal consent prior to initiation of services.  Please see initial visit note for detailed documentation.   Patient Care Team: Shirline Frees, NP as PCP - General (Family Medicine) Meriam Sprague, MD as PCP - Cardiology (Cardiology) Verner Chol, [facility] as Pharmacist (Pharmacist)  Recent office visits: 10/08/21 [name], LPN - Patient presented via phone for Medicare Annual Wellness visit. No medication changes. Voiced goal of walking up to 3 miles.  05/08/21 Shirline Frees, NP - Patient presented for Routine general medical exam and other concerns. No medication changes.  Recent consult visits: 01/12/22 Loman Brooklyn, MD (cardiology): Patient presented for Afib follow up. Plan for ablation.  01/04/22 Marygrace Drought, RPH [facility]) - Patient presented for HLD. Prescribed Ezetimibe 10 mg. Stopped Atorvastatin 20 mg.    12/23/21 Omar Person, MD (Pulm) - Patient presented for moderate  persistent asthma without complication and other concerns. No medication changes.   12/09/21 Patient presented to [facility] for ECG.  12/02/21 Danice Goltz, PA (Cardiology) - Patient presented for Paroxysmal atrial fibrillation and other concerns. No medication changes.   11/27/21 Meriam Sprague, MD(Cardiology) - Patient presented for CAD involving native coronary artery of native heart without angina pectoris and other concerns. Prescribed Atorvastatin 20 mg. Stopped Flecainide Acetate. Stopped Probiotic. Stopped Rosuvastatin.   11/09/21 Reggy Eye, PA  [facility]) - Patient presented for Acute bronchitis and other concerns. Prescribed Levalbuterol. Prescribed Prednisone. Prescribed Azithromycin.  07/28/21 Parrett, Virgel Bouquet, NP (Pulmonology) - Patient presented for moderate persistent chronic asthma without complication. No medication changes.  Hospital visits: None in previous 6 months  Objective:  Lab Results  Component Value Date   CREATININE 0.99 11/20/2021   BUN 23 11/20/2021   GFR 62.30 05/08/2021   GFRNONAA 59 (L) 05/21/2020   GFRAA 68 05/21/2020   NA 140 11/20/2021   K 3.7 11/20/2021   CALCIUM 9.2 11/20/2021   CO2 24 11/20/2021   GLUCOSE 98 11/20/2021    Lab Results  Component Value Date/Time   HGBA1C 5.6 05/08/2021 09:34 AM   GFR 62.30 05/08/2021 09:34 AM   GFR 66.11 02/06/2019 07:48 AM    Last diabetic Eye exam: No results found for: "HMDIABEYEEXA"  Last diabetic Foot exam: No results found for: "HMDIABFOOTEX"   Lab Results  Component Value Date   CHOL 145 05/08/2021   HDL 68.80 05/08/2021   LDLCALC 61 05/08/2021   TRIG 79.0 05/08/2021   CHOLHDL 2 05/08/2021       Latest Ref Rng & Units 05/08/2021  9:34 AM 03/27/2020    9:17 AM 02/06/2019    7:48 AM  Hepatic Function  Total Protein 6.0 - 8.3 g/dL 7.1  6.6  6.3   Albumin 3.5 - 5.2 g/dL 4.2   4.0   AST 0 - 37 U/L $Remo'21  18  23   'hJNNU$ ALT 0 - 35 U/L 29  20  36   Alk Phosphatase 39 -  117 U/L 50   54   Total Bilirubin 0.2 - 1.2 mg/dL 0.8  0.7  0.8     Lab Results  Component Value Date/Time   TSH 0.86 05/08/2021 09:34 AM   TSH 1.26 03/27/2020 09:17 AM       Latest Ref Rng & Units 05/08/2021    9:34 AM 03/27/2020    9:17 AM 02/06/2019    7:48 AM  CBC  WBC 4.0 - 10.5 K/uL 5.1  5.3  4.3   Hemoglobin 12.0 - 15.0 g/dL 14.4  16.7  14.8   Hematocrit 36.0 - 46.0 % 42.4  48.3  42.9   Platelets 150.0 - 400.0 K/uL 229.0  243  215.0     No results found for: "VD25OH"  Clinical ASCVD: No  The 10-year ASCVD risk score (Arnett DK, et al., 2019) is: 12.1%   Values used to calculate the score:     Age: 71 years     Sex: Female     Is Non-Hispanic African American: No     Diabetic: No     Tobacco smoker: No     Systolic Blood Pressure: 627 mmHg     Is BP treated: Yes     HDL Cholesterol: 68.8 mg/dL     Total Cholesterol: 145 mg/dL       10/08/2021   10:03 AM 05/08/2021    9:04 AM 09/29/2020    9:22 AM  Depression screen PHQ 2/9  Decreased Interest 0 1 0  Down, Depressed, Hopeless 0 1 0  PHQ - 2 Score 0 2 0  Altered sleeping  1   Tired, decreased energy  2   Change in appetite  0   Feeling bad or failure about yourself   0   Trouble concentrating  0   Moving slowly or fidgety/restless  0   Suicidal thoughts  0   PHQ-9 Score  5      CHA2DS2/VAS Stroke Risk Points  Current as of just now     3 >= 2 Points: High Risk  1 - 1.99 Points: Medium Risk  0 Points: Low Risk    No Change      Details    This score determines the patient's risk of having a stroke if the  patient has atrial fibrillation.       Points Metrics  0 Has Congestive Heart Failure:  No    Current as of just now  0 Has Vascular Disease:  No    Current as of just now  1 Has Hypertension:  Yes    Current as of just now  1 Age:  71    Current as of just now  0 Has Diabetes:  No    Current as of just now  0 Had Stroke:  No  Had TIA:  No  Had Thromboembolism:  No    Current as of just now   1 Female:  Yes    Current as of just now     Social History   Tobacco Use  Smoking Status Former  Packs/day: 0.25   Years: 10.00   Total pack years: 2.50   Types: Cigarettes   Quit date: 06/07/1981   Years since quitting: 40.6   Passive exposure: Past  Smokeless Tobacco Never  Tobacco Comments   Former smoker 12/02/21   BP Readings from Last 3 Encounters:  01/12/22 110/74  12/23/21 (!) 210/64  12/02/21 110/70   Pulse Readings from Last 3 Encounters:  01/12/22 (!) 59  12/23/21 (!) 53  12/02/21 (!) 56   Wt Readings from Last 3 Encounters:  01/12/22 181 lb 3.2 oz (82.2 kg)  12/23/21 180 lb 9.6 oz (81.9 kg)  12/02/21 183 lb 3.2 oz (83.1 kg)   BMI Readings from Last 3 Encounters:  01/12/22 29.25 kg/m  12/23/21 29.15 kg/m  12/02/21 29.57 kg/m    Assessment/Interventions: Review of patient past medical history, allergies, medications, health status, including review of consultants reports, laboratory and other test data, was performed as part of comprehensive evaluation and provision of chronic care management services.   SDOH:  (Social Determinants of Health) assessments and interventions performed: Yes    SDOH Screenings   Alcohol Screen: Low Risk  (10/08/2021)   Alcohol Screen    Last Alcohol Screening Score (AUDIT): 0  Depression (PHQ2-9): Low Risk  (10/08/2021)   Depression (PHQ2-9)    PHQ-2 Score: 0  Financial Resource Strain: Low Risk  (10/08/2021)   Overall Financial Resource Strain (CARDIA)    Difficulty of Paying Living Expenses: Not very hard  Food Insecurity: No Food Insecurity (10/08/2021)   Hunger Vital Sign    Worried About Running Out of Food in the Last Year: Never true    Ran Out of Food in the Last Year: Never true  Housing: Low Risk  (10/08/2021)   Housing    Last Housing Risk Score: 0  Physical Activity: Insufficiently Active (10/08/2021)   Exercise Vital Sign    Days of Exercise per Week: 3 days    Minutes of Exercise per Session: 20 min   Social Connections: Socially Isolated (10/08/2021)   Social Connection and Isolation Panel [NHANES]    Frequency of Communication with Friends and Family: More than three times a week    Frequency of Social Gatherings with Friends and Family: More than three times a week    Attends Religious Services: Never    Marine scientist or Organizations: No    Attends Archivist Meetings: Never    Marital Status: Divorced  Stress: Stress Concern Present (10/08/2021)   [employer] [place]    Feeling of Stress : To some extent  Tobacco Use: Medium Risk (01/12/2022)   Patient History    Smoking Tobacco Use: Former    Smokeless Tobacco Use: Never    Passive Exposure: Past  Transportation Needs: No Transportation Needs (10/08/2021)   PRAPARE - Hydrologist (Medical): No    Lack of Transportation (Non-Medical): No    CCM Care Plan  No Known Allergies  Medications Reviewed Today     Reviewed by Viona Gilmore, [facility] (Pharmacist) on 01/22/22 at [phone]  Med List Status: <None>   Medication Order Taking? Sig Documenting Provider Last Dose Status Informant  albuterol (VENTOLIN HFA) 108 (90 Base) MCG/ACT inhaler [id number]  INHALE TWO PUFFS BY MOUTH INTO LUNGS EVERY 6 HOURS AS NEEDED FOR SHORTNESS OF BREATH OR FOR WHEEZING Nafziger, Tommi Rumps, NP  Active   ALPRAZolam (XANAX) 0.25 MG tablet [id number]  TAKE 1 TO  2 TABLETS BY MOUTH ONCE daily AS NEEDED Nafziger, Tommi Rumps, NP  Active   budesonide-formoterol [facility]) 160-4.5 MCG/ACT inhaler [id number]  Inhale 2 puffs into the lungs 2 (two) times daily. Nafziger, Tommi Rumps, NP  Active   ezetimibe (ZETIA) 10 MG tablet [id number]  Take 1 tablet (10 mg total) by mouth daily. [name], MD  Active   famotidine (PEPCID) 20 MG tablet [id number]  Take 20 mg by mouth daily. [provider]  Active   FLUoxetine (PROZAC) 20 MG capsule [id number]  TAKE ONE CAPSULE BY MOUTH  ONCE DAILY Nafziger, Tommi Rumps, NP  Active   fluticasone (FLONASE) 50 MCG/ACT nasal spray [id number]  Place 2 sprays into both nostrils as needed. [provider]  Active   levalbuterol Penne Lash) 0.63 MG/3ML nebulizer solution [id number]  Take 3 mLs (0.63 mg total) by nebulization every 6 (six) hours as needed for wheezing or shortness of breath. Nafziger, Tommi Rumps, NP  Active   levocetirizine (XYZAL) 5 MG tablet [id number]  Take 5 mg by mouth as needed for allergies. [provider]  Active   lisinopril-hydrochlorothiazide (ZESTORETIC) 20-12.5 MG tablet [id number]  Take 2 tablets by mouth daily. [name], MD  Active   metoprolol succinate (TOPROL-XL) 25 MG 24 hr tablet [id number]  Take 1 tablet (25 mg total) by mouth daily. Take with or immediately following a meal. [name], MD  Active   montelukast (SINGULAIR) 10 MG tablet [id number]  TAKE ONE TABLET BY MOUTH EVERYDAY AT BEDTIME Nafziger, Tommi Rumps, NP  Active   Multiple Vitamin (MULTIVITAMIN) tablet [id number]  Take 1 tablet by mouth daily. [provider]  Active   Multiple Vitamins-Minerals (VISION FORMULA PO) [id number]  Take by mouth daily.  [provider]  Active   Omega-3 Fatty Acids (FISH OIL PO) [id number]  Take 1 capsule by mouth daily. [provider]  Active   rivaroxaban (XARELTO) 20 MG TABS tablet [id number]  Take 1 tablet (20 mg total) by mouth every evening. Nafziger, Tommi Rumps, NP  Active   triamcinolone cream (KENALOG) 0.1 % [id number]  APPLY TOPICALLY TWICE DAILY [name], NP  Active             Patient Active Problem List   Diagnosis Date Noted   Hyperlipidemia 01/05/2022   OSA (obstructive sleep apnea) 07/28/2021   Thoracic aortic aneurysm without rupture ([place]) 01/08/2021   Sebaceous cyst 2022   Paroxysmal atrial fibrillation ([place]) 04/03/2020   Secondary hypercoagulable state ([place]) 04/03/2020   Thyroid nodule    Osteoarthritis    Cyst of knee joint 02/13/2016    Sinusitis 10/30/2015   Asthma with exacerbation 10/30/2015   Sciatica 03/06/2015   Abdominal pain, right upper quadrant 02/12/2015   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   Patient has been busy with lots of medical appointments and patient has an ablation scheduled for December.   Patient reports breathing has been ok. She has been getting back into walking. She was getting 2 miles a day  previously before the hot weather. Patient was not needed the nebulizer at all lately. Patient does use the albuterol every couple of days. She brings it with her all of the time. Patient has a nice supply of the Symbicort.   Conditions to be addressed/monitored:  Hypertension, Hyperlipidemia, Atrial Fibrillation, Asthma, Depression, Anxiety, Osteoarthritis and Allergic Rhinitis  Conditions addressed this visit: Asthma, hypertension   Care Plan : CCM Pharmacy Care Plan  Updates made by Viona Gilmore, [place] since 01/28/2022 12:00 AM     Problem: Problem: Hypertension, Hyperlipidemia, Atrial Fibrillation, Asthma, Depression, Anxiety, Osteoarthritis and Allergic Rhinitis      Long-Range Goal: Patient-Specific Goal   Start Date: 10/15/2020  Expected End Date: 10/15/2021  Recent Progress: On track  Priority: High  Note:   Current Barriers:  Unable to independently afford treatment  regimen  Pharmacist Clinical Goal(s):  Patient will achieve adherence to monitoring guidelines and medication adherence to achieve therapeutic efficacy through collaboration with PharmD and provider.   Interventions: 1:1 collaboration with [name], NP regarding development and update of comprehensive plan of care as evidenced by provider attestation and co-signature Inter-disciplinary care team collaboration (see longitudinal plan of care) Comprehensive medication review performed; medication list updated in electronic medical record  Hypertension (BP goal <140/90) -Controlled -Current treatment: Lisinopril - HCTZ 20-12.5 mg 2 tablets daily - Appropriate, Effective, Safe, Accessible Metoprolol succinate 25 mg 1 tablet daily - Appropriate, Effective, Safe, Accessible -Medications previously tried: none  -Current home readings: highest 120/80, lowest 80; 114/78 (twice a week); pulse: 63, 72, 65, upper 60s (arm cuff) -Current dietary habits: cut down on salt and looking at sodium content; eats out about 50% of the time -Current exercise habits: walking dogs -Denies hypotensive/hypertensive symptoms -Educated on Exercise goal of 150 minutes per week; Importance of home blood pressure monitoring; Proper BP monitoring technique; -Counseled to monitor BP at home weekly, document, and provide log at future appointments -Counseled on diet and exercise extensively Recommended to continue current medication  Hyperlipidemia: (LDL goal < 70) -Uncontrolled -Current treatment: Ezetimibe 10 mg 1 tablet daily - Appropriate, Query effective, Safe, Accessible -Medications previously tried: rosuvastatin, atorvastatin (myalgias) -Current dietary patterns: eats out about 50% of the time; uses an air fryer -Current exercise habits: walking dogs -Educated on Cholesterol goals;  Benefits of statin for ASCVD risk reduction; Importance of limiting foods high in cholesterol; Exercise goal of 150  minutes per week; -Counseled on diet and exercise extensively Recommended to continue current medication Patient is working on [place] patient assistance as well.  Atrial Fibrillation (Goal: prevent stroke and major bleeding) -Controlled -CHADSVASC: 3 -Current treatment: Rate/rhythm control: flecainide 50 mg twice daily, metoprolol succinate 50 mg 1 tablet daily - Appropriate, Effective, Safe, Accessible Anticoagulation: Xarelto 20 mg 1 tablet with supper  - Appropriate, Effective, Safe, Query accessible -Medications previously tried: none -Home BP and HR readings: provided above  -Counseled on increased risk of stroke due to Afib and benefits of anticoagulation for stroke prevention; importance of adherence to anticoagulant exactly as prescribed; avoidance of NSAIDs due to increased bleeding risk with anticoagulants; -Counseled on diet and exercise extensively Recommended to continue current medication  Asthma (Goal: control symptoms) -Controlled -Current treatment  Symbicort inhale 2 puffs twice daily  - Appropriate, Effective, Safe, Accessible Albuterol nebulizer as needed - Appropriate, Effective, Safe, Accessible Proair respiclick 2 puffs as needed - Appropriate, Effective, Safe, Accessible Montelukast 10 mg 1 tablet daily - Appropriate, Effective, Safe, Accessible -Medications previously tried: [facility] (cost), Advair -Exacerbations  requiring treatment in last 6 months: none -Patient reports consistent use of maintenance inhaler -Frequency of rescue inhaler use: once a week (during walks) -Counseled on Proper inhaler technique; Benefits of consistent maintenance inhaler use When to use rescue inhaler Differences between maintenance and rescue inhalers -Recommended to continue current medication  Depression/Anxiety (Goal: minimize symptoms) -Controlled -Current treatment: Alprazolam 0.25 mg 1-2 tablets daily as needed - Appropriate, Effective, Query Safe, Accessible   Fluoxetine 20 mg 1 capsule daily - Appropriate, Effective, Safe, Accessible -Medications previously tried/failed: citalopram (ineffective, fatigue) -PHQ9: 3 -GAD7: n/a -Connected with [place] for mental health support -Educated on Benefits of medication for symptom control Benefits of cognitive-behavioral therapy with or without medication -Recommended to continue current medication  Allergic rhinitis (Goal: minimize symptoms) -Controlled -Current treatment  Levocetirizine 5 mg 1 tablet every evening as needed - Appropriate, Effective, Safe, Accessible Fluticasone 50 mcg/act 2 sprays daily as needed - Appropriate, Effective, Safe, Accessible -Medications previously tried: none  -Recommended trial of Flonase sensimist if regular Flonase is causing nosebleeds   GERD (Goal: minimize symptoms) -Controlled -Current treatment  Famotidine 20 mg 1 tablet daily - Appropriate, Effective, Safe, Accessible -Medications previously tried: none  -Counseled on non-pharmacologic management of symptoms such as elevating the head of your bed, avoiding eating 2-3 hours before bed, avoiding triggering foods such as acidic, spicy, or fatty foods, eating smaller meals, and wearing clothes that are loose around the waist  Health Maintenance -Vaccine gaps: shingles, tetanus, pneumonia ([facility]) -Current therapy:  Multivitamin 1 tablet daily (gummy) Vision vitamin daily  Align probiotic daily  Triamcinolone 0.1% cream twice daily as needed -Educated on Cost vs benefit of each product must be carefully weighed by individual consumer -Patient is satisfied with current therapy and denies issues -Recommended to continue current medication  Patient Goals/Self-Care Activities Patient will:  - take medications as prescribed check blood pressure weekly, document, and provide at future appointments target a minimum of 150 minutes of moderate intensity exercise weekly  Follow Up Plan:  Telephone follow up appointment with care management team member scheduled for: 6 months      Medication Assistance:  Symbicort obtained through [place] medication [place].  Enrollment ends 05/2022  Compliance/Adherence/Medication fill history: Care Gaps: Shingrix, tetanus, COVID booster, mammogram BP- 110/74 (01/12/22)   Star-Rating Drugs: Atorvastatin 20 MG - Last filled 12/16/21 30 DS at [facility] Lisinopril- HCTZ 20/12.5 mg - Last filled 12/10/21 30 DS at [facility]  Patient's preferred pharmacy is:  [facility] - [place], [place] - [address] [place] [address] Phone: [phone] Fax: [phone]   Uses pill box? Yes - weekly  Pt endorses 100% compliance - wasn't sure if she took it  We discussed: Benefits of medication synchronization, packaging and delivery as well as enhanced pharmacist oversight with [facility]. Patient decided to: Utilize [facility] pharmacy for medication synchronization, packaging and delivery  Care Plan and Follow Up Patient Decision:  Patient agrees to Care Plan and Follow-up.  Plan: Telephone follow up appointment with care management team member scheduled for:  6 months  [name], PharmD [place] Pharmacist [place] at [place] [phone]

## 2022-01-22 ENCOUNTER — Telehealth: Payer: Medicare Other

## 2022-01-22 ENCOUNTER — Ambulatory Visit (INDEPENDENT_AMBULATORY_CARE_PROVIDER_SITE_OTHER): Payer: Medicare Other | Admitting: Pharmacist

## 2022-01-22 DIAGNOSIS — I10 Essential (primary) hypertension: Secondary | ICD-10-CM

## 2022-01-22 DIAGNOSIS — I480 Paroxysmal atrial fibrillation: Secondary | ICD-10-CM

## 2022-01-27 ENCOUNTER — Telehealth: Payer: Self-pay | Admitting: *Deleted

## 2022-01-27 NOTE — Telephone Encounter (Signed)
-----   Message from Armando Gang sent at 01/27/2022  4:18 PM EDT ----- Regarding: RE: due for MRA OF CHEST IN DEC 2023 PER DR. Johney Frame Deferred until 05/07/22 ----- Message ----- From: Nuala Alpha, LPN Sent: 3/50/0938   1:27 PM EDT To: Armando Gang; [facility] Subject: due for MRA OF CHEST IN DEC 2023 PER DR. PEM#  This pt is due soon (Dec 2023) for her MRA OF CHEST per Dr. Johney Frame.  Order is in.  Pt aware you will be calling soon to arrange.  Can you please schedule and shoot me the date thereafter?  Thanks a million [employer]

## 2022-01-28 NOTE — Patient Instructions (Signed)
Hi Pam,  It was great to catch up with you again! Please keep [place] posted as you work on financial assistance with the Xarelto and if we need to provide more samples, we can.  Please reach out to me if you have any questions or need anything before our follow up!  Best, [name]  [name], PharmD, [facility]   Visit Information   Goals Addressed   None    Patient Care Plan: CCM Pharmacy Care Plan     Problem Identified: Problem: Hypertension, Hyperlipidemia, Atrial Fibrillation, Asthma, Depression, Anxiety, Osteoarthritis and Allergic Rhinitis      Long-Range Goal: Patient-Specific Goal   Start Date: 10/15/2020  Expected End Date: 10/15/2021  Recent Progress: On track  Priority: High  Note:   Current Barriers:  Unable to independently afford treatment regimen  Pharmacist Clinical Goal(s):  Patient will achieve adherence to monitoring guidelines and medication adherence to achieve therapeutic efficacy through collaboration with PharmD and provider.   Interventions: 1:1 collaboration with [name], NP regarding development and update of comprehensive plan of care as evidenced by provider attestation and co-signature Inter-disciplinary care team collaboration (see longitudinal plan of care) Comprehensive medication review performed; medication list updated in electronic medical record  Hypertension (BP goal <140/90) -Controlled -Current treatment: Lisinopril - HCTZ 20-12.5 mg 2 tablets daily - Appropriate, Effective, Safe, Accessible Metoprolol succinate 25 mg 1 tablet daily - Appropriate, Effective, Safe, Accessible -Medications previously tried: none  -Current home readings: highest 120/80, lowest 80; 114/78 (twice a week); pulse: 63, 72, 65, upper 60s (arm cuff) -Current dietary habits: cut down on salt and looking at sodium content; eats out about 50% of the time -Current exercise habits: walking  dogs -Denies hypotensive/hypertensive symptoms -Educated on Exercise goal of 150 minutes per week; Importance of home blood pressure monitoring; Proper BP monitoring technique; -Counseled to monitor BP at home weekly, document, and provide log at future appointments -Counseled on diet and exercise extensively Recommended to continue current medication  Hyperlipidemia: (LDL goal < 70) -Uncontrolled -Current treatment: Ezetimibe 10 mg 1 tablet daily - Appropriate, Query effective, Safe, Accessible -Medications previously tried: rosuvastatin, atorvastatin (myalgias) -Current dietary patterns: eats out about 50% of the time; uses an air fryer -Current exercise habits: walking dogs -Educated on Cholesterol goals;  Benefits of statin for ASCVD risk reduction; Importance of limiting foods high in cholesterol; Exercise goal of 150 minutes per week; -Counseled on diet and exercise extensively Recommended to continue current medication Patient is working on [place] patient assistance as well.  Atrial Fibrillation (Goal: prevent stroke and major bleeding) -Controlled -CHADSVASC: 3 -Current treatment: Rate/rhythm control: flecainide 50 mg twice daily, metoprolol succinate 50 mg 1 tablet daily - Appropriate, Effective, Safe, Accessible Anticoagulation: Xarelto 20 mg 1 tablet with supper  - Appropriate, Effective, Safe, Query accessible -Medications previously tried: none -Home BP and HR readings: provided above  -Counseled on increased risk of stroke due to Afib and benefits of anticoagulation for stroke prevention; importance of adherence to anticoagulant exactly as prescribed; avoidance of NSAIDs due to increased bleeding risk with anticoagulants; -Counseled on diet and exercise extensively Recommended to continue current medication  Asthma (Goal: control symptoms) -Controlled -Current treatment  Symbicort inhale 2 puffs twice daily  - Appropriate, Effective, Safe, Accessible Albuterol  nebulizer as needed - Appropriate, Effective, Safe, Accessible Proair respiclick 2 puffs as needed - Appropriate, Effective, Safe, Accessible Montelukast 10 mg 1 tablet daily - Appropriate, Effective, Safe, Accessible -Medications previously tried: [employer]  ellipta (cost), Advair -Exacerbations requiring treatment in last 6 months: none -Patient reports consistent use of maintenance inhaler -Frequency of rescue inhaler use: once a week (during walks) -Counseled on Proper inhaler technique; Benefits of consistent maintenance inhaler use When to use rescue inhaler Differences between maintenance and rescue inhalers -Recommended to continue current medication  Depression/Anxiety (Goal: minimize symptoms) -Controlled -Current treatment: Alprazolam 0.25 mg 1-2 tablets daily as needed - Appropriate, Effective, Query Safe, Accessible  Fluoxetine 20 mg 1 capsule daily - Appropriate, Effective, Safe, Accessible -Medications previously tried/failed: citalopram (ineffective, fatigue) -PHQ9: 3 -GAD7: n/a -Connected with [place] for mental health support -Educated on Benefits of medication for symptom control Benefits of cognitive-behavioral therapy with or without medication -Recommended to continue current medication  Allergic rhinitis (Goal: minimize symptoms) -Controlled -Current treatment  Levocetirizine 5 mg 1 tablet every evening as needed - Appropriate, Effective, Safe, Accessible Fluticasone 50 mcg/act 2 sprays daily as needed - Appropriate, Effective, Safe, Accessible -Medications previously tried: none  -Recommended trial of Flonase sensimist if regular Flonase is causing nosebleeds   GERD (Goal: minimize symptoms) -Controlled -Current treatment  Famotidine 20 mg 1 tablet daily - Appropriate, Effective, Safe, Accessible -Medications previously tried: none  -Counseled on non-pharmacologic management of symptoms such as elevating the head of your bed, avoiding eating  2-3 hours before bed, avoiding triggering foods such as acidic, spicy, or fatty foods, eating smaller meals, and wearing clothes that are loose around the waist  Health Maintenance -Vaccine gaps: shingles, tetanus, pneumonia ([facility]) -Current therapy:  Multivitamin 1 tablet daily (gummy) Vision vitamin daily  Align probiotic daily  Triamcinolone 0.1% cream twice daily as needed -Educated on Cost vs benefit of each product must be carefully weighed by individual consumer -Patient is satisfied with current therapy and denies issues -Recommended to continue current medication  Patient Goals/Self-Care Activities Patient will:  - take medications as prescribed check blood pressure weekly, document, and provide at future appointments target a minimum of 150 minutes of moderate intensity exercise weekly  Follow Up Plan: Telephone follow up appointment with care management team member scheduled for: 6 months       Patient verbalizes understanding of instructions and care plan provided today and agrees to view in [place]. Active MyChart status and patient understanding of how to access instructions and care plan via MyChart confirmed with patient.    Telephone follow up appointment with pharmacy team member scheduled for: 6 months  Viona Gilmore, [facility]

## 2022-02-02 ENCOUNTER — Telehealth: Payer: Self-pay | Admitting: Pharmacist

## 2022-02-02 NOTE — Telephone Encounter (Signed)
Called SNF. They said they need pt to call them to verify insurance. They also need new Rx as her name is crossed off and rewritten. Has to be same provider who originally signed. Dr. Lovena Le wont be back until later next week. Called pt and LVM

## 2022-02-03 ENCOUNTER — Telehealth: Payer: Self-pay | Admitting: Pharmacist

## 2022-02-03 NOTE — Telephone Encounter (Signed)
Patient called back.  Explained message from [place]. Patient will call [employer] to give updated insurance info

## 2022-02-03 NOTE — Chronic Care Management (AMB) (Signed)
Chronic Care Management Pharmacy Assistant   Name: Mai Longnecker  MRN: [id number] DOB: December 31, 1950  Reason for Encounter: Medication Review Medication Coordination   Recent office visits:  None  Recent consult visits:  None  Hospital visits:  None in previous 6 months  Medications: Outpatient Encounter Medications as of 02/03/2022  Medication Sig   albuterol (VENTOLIN HFA) 108 (90 Base) MCG/ACT inhaler INHALE TWO PUFFS BY MOUTH INTO LUNGS EVERY 6 HOURS AS NEEDED FOR SHORTNESS OF BREATH OR FOR WHEEZING   ALPRAZolam (XANAX) 0.25 MG tablet TAKE 1 TO 2 TABLETS BY MOUTH ONCE daily AS NEEDED   budesonide-formoterol (SYMBICORT) 160-4.5 MCG/ACT inhaler Inhale 2 puffs into the lungs 2 (two) times daily.   ezetimibe (ZETIA) 10 MG tablet Take 1 tablet (10 mg total) by mouth daily.   famotidine (PEPCID) 20 MG tablet Take 20 mg by mouth daily.   FLUoxetine (PROZAC) 20 MG capsule TAKE ONE CAPSULE BY MOUTH ONCE DAILY   fluticasone (FLONASE) 50 MCG/ACT nasal spray Place 2 sprays into both nostrils as needed.   levalbuterol (XOPENEX) 0.63 MG/3ML nebulizer solution Take 3 mLs (0.63 mg total) by nebulization every 6 (six) hours as needed for wheezing or shortness of breath.   levocetirizine (XYZAL) 5 MG tablet Take 5 mg by mouth as needed for allergies.   lisinopril-hydrochlorothiazide (ZESTORETIC) 20-12.5 MG tablet Take 2 tablets by mouth daily.   metoprolol succinate (TOPROL-XL) 25 MG 24 hr tablet Take 1 tablet (25 mg total) by mouth daily. Take with or immediately following a meal.   montelukast (SINGULAIR) 10 MG tablet TAKE ONE TABLET BY MOUTH EVERYDAY AT BEDTIME   Multiple Vitamin (MULTIVITAMIN) tablet Take 1 tablet by mouth daily.   Multiple Vitamins-Minerals (VISION FORMULA PO) Take by mouth daily.    Omega-3 Fatty Acids (FISH OIL PO) Take 1 capsule by mouth daily.   rivaroxaban (XARELTO) 20 MG TABS tablet Take 1 tablet (20 mg total) by mouth every evening.   triamcinolone cream  (KENALOG) 0.1 % APPLY TOPICALLY TWICE DAILY   No facility-administered encounter medications on file as of 02/03/2022.   Reviewed chart for medication changes ahead of medication coordination call.   BP Readings from Last 3 Encounters:  01/12/22 110/74  12/23/21 (!) 210/64  12/02/21 110/70    Lab Results  Component Value Date   HGBA1C 5.6 05/08/2021     Patient obtains medications through Adherence Packaging  30 Days   Last adherence delivery included:  Ezetimibe (Zetia) 10 mg: Take one tab daily at breakfast Montelukast (Singulair) 10 mg - Take one tablet at Bedtime Metropolol Succinate (Toprolol-XL) 25 mg - Take one tablet at Breakfast Fluoxetine (Prozac) 20 mg - Take one capsule at Breakfast Lisinopril-HCTZ (Zestoric) 20-12.5 mg - Take two tablets at Breakfast Alprazolam (Xanax) - Take 1-2 tablets per day as needed     Patient declined the following medications  Atorvastatin 20 mg - Take one tablet at Breakfast ( patient reports not taking is in process of PAP for repatha with another office) Albuterol HFA inhaler - use as needed. ( Does not need this month) Rivaroxaban (Xarelto) 20 mg -  will provide samples in the office for 1 month    Patient is due for next adherence delivery on: 02/16/22. Called patient and reviewed medications and coordinated delivery. Packs 30 DS  This delivery to include: Ezetimibe (Zetia) 10 mg: Take one tab daily at breakfast Montelukast (Singulair) 10 mg - Take one tablet at Bedtime Metropolol Succinate (Toprolol-XL) 25 mg - Take  one tablet at Breakfast Fluoxetine (Prozac) 20 mg - Take one capsule at Breakfast Lisinopril-HCTZ (Zestoric) 20-12.5 mg - Take two tablets at Breakfast Alprazolam (Xanax) - Take 1-2 tablets per day as needed Xarelto    Patient declined the following medications due to abundance Albuterol HFA inhaler - use as needed  Notes: Xarelto co-pay card info from PT as follows forwarded to pharm BIN: [id number] PCN:  PDMI ID: [id number] Grp: Xarelto 01  Confirmed delivery date of 02/16/22, advised patient that pharmacy will contact them the morning of delivery.   Care Gaps: Zoster Vaccine - Overdue COVID Booster - Overdue Flu Vaccine - Overdue Mammogram - Postponed TDAP - Postponed BP- 110/74 01/12/22 AWV- 5/23 CCM- 2/23  Star Rating Drugs: Atorvastatin 20 MG - Last filled 12/10/21 30 DS at [facility] Lisinopril- HCTZ 20/12.5 mg - Last filled 12/10/21 30 DS at [facility] Pharmacist Assistant [phone]

## 2022-02-04 DIAGNOSIS — I4891 Unspecified atrial fibrillation: Secondary | ICD-10-CM | POA: Diagnosis not present

## 2022-02-04 DIAGNOSIS — J45909 Unspecified asthma, uncomplicated: Secondary | ICD-10-CM | POA: Diagnosis not present

## 2022-02-04 DIAGNOSIS — E785 Hyperlipidemia, unspecified: Secondary | ICD-10-CM

## 2022-02-04 DIAGNOSIS — I10 Essential (primary) hypertension: Secondary | ICD-10-CM | POA: Diagnosis not present

## 2022-02-04 DIAGNOSIS — F32A Depression, unspecified: Secondary | ICD-10-CM | POA: Diagnosis not present

## 2022-02-16 ENCOUNTER — Other Ambulatory Visit: Payer: Self-pay | Admitting: *Deleted

## 2022-02-16 DIAGNOSIS — Z0000 Encounter for general adult medical examination without abnormal findings: Secondary | ICD-10-CM

## 2022-02-16 DIAGNOSIS — I4891 Unspecified atrial fibrillation: Secondary | ICD-10-CM

## 2022-02-16 DIAGNOSIS — J4540 Moderate persistent asthma, uncomplicated: Secondary | ICD-10-CM

## 2022-02-16 DIAGNOSIS — I10 Essential (primary) hypertension: Secondary | ICD-10-CM

## 2022-02-16 MED ORDER — RIVAROXABAN 20 MG PO TABS: 20.0000 mg | ORAL_TABLET | Freq: Every evening | ORAL | 0 refills | Status: DC

## 2022-02-17 DIAGNOSIS — G4733 Obstructive sleep apnea (adult) (pediatric): Secondary | ICD-10-CM | POA: Diagnosis not present

## 2022-02-23 DIAGNOSIS — G4733 Obstructive sleep apnea (adult) (pediatric): Secondary | ICD-10-CM | POA: Diagnosis not present

## 2022-02-24 NOTE — Telephone Encounter (Signed)
Called [employer] to follow up on patient assistance. They stated pt still needs to call in so they can 3 way with her insurance. They also stated they did not get my new rx I sent. I will refax. Called pt and LVM Pt can call [employer] at [phone]

## 2022-03-04 NOTE — Telephone Encounter (Signed)
Spoke with patient. She tried to call [employer] a few times but was on hold too long. She will try again. She did get approved for LIS but level 4. Her premium went down but drug cost still the same.

## 2022-03-08 ENCOUNTER — Other Ambulatory Visit: Payer: Self-pay | Admitting: Adult Health

## 2022-03-08 ENCOUNTER — Telehealth: Payer: Self-pay | Admitting: Pharmacist

## 2022-03-08 ENCOUNTER — Other Ambulatory Visit: Payer: Medicare Other

## 2022-03-08 DIAGNOSIS — F419 Anxiety disorder, unspecified: Secondary | ICD-10-CM

## 2022-03-08 NOTE — Chronic Care Management (AMB) (Signed)
Chronic Care Management Pharmacy Assistant   Name: Mylah Baynes  MRN: [id number] DOB: 1950-11-13  Reason for Encounter: Medication Review Medication Coordination   Recent office visits:  None  Recent consult visits:  None  Hospital visits:  None in previous 6 months  Medications: Outpatient Encounter Medications as of 03/08/2022  Medication Sig   albuterol (VENTOLIN HFA) 108 (90 Base) MCG/ACT inhaler INHALE TWO PUFFS BY MOUTH INTO LUNGS EVERY 6 HOURS AS NEEDED FOR SHORTNESS OF BREATH OR FOR WHEEZING   ALPRAZolam (XANAX) 0.25 MG tablet TAKE 1 TO 2 TABLETS BY MOUTH ONCE daily AS NEEDED   budesonide-formoterol (SYMBICORT) 160-4.5 MCG/ACT inhaler Inhale 2 puffs into the lungs 2 (two) times daily.   ezetimibe (ZETIA) 10 MG tablet Take 1 tablet (10 mg total) by mouth daily.   famotidine (PEPCID) 20 MG tablet Take 20 mg by mouth daily.   FLUoxetine (PROZAC) 20 MG capsule TAKE ONE CAPSULE BY MOUTH ONCE DAILY   fluticasone (FLONASE) 50 MCG/ACT nasal spray Place 2 sprays into both nostrils as needed.   levalbuterol (XOPENEX) 0.63 MG/3ML nebulizer solution Take 3 mLs (0.63 mg total) by nebulization every 6 (six) hours as needed for wheezing or shortness of breath.   levocetirizine (XYZAL) 5 MG tablet Take 5 mg by mouth as needed for allergies.   lisinopril-hydrochlorothiazide (ZESTORETIC) 20-12.5 MG tablet Take 2 tablets by mouth daily.   metoprolol succinate (TOPROL-XL) 25 MG 24 hr tablet Take 1 tablet (25 mg total) by mouth daily. Take with or immediately following a meal.   montelukast (SINGULAIR) 10 MG tablet TAKE ONE TABLET BY MOUTH EVERYDAY AT BEDTIME   Multiple Vitamin (MULTIVITAMIN) tablet Take 1 tablet by mouth daily.   Multiple Vitamins-Minerals (VISION FORMULA PO) Take by mouth daily.    Omega-3 Fatty Acids (FISH OIL PO) Take 1 capsule by mouth daily.   rivaroxaban (XARELTO) 20 MG TABS tablet Take 1 tablet (20 mg total) by mouth every evening.   triamcinolone cream  (KENALOG) 0.1 % APPLY TOPICALLY TWICE DAILY   No facility-administered encounter medications on file as of 03/08/2022.  Reviewed chart for medication changes ahead of medication coordination call.   BP Readings from Last 3 Encounters:  01/12/22 110/74  12/23/21 (!) 210/64  12/02/21 110/70    Lab Results  Component Value Date   HGBA1C 5.6 05/08/2021     Patient obtains medications through Adherence Packaging  30 Days   Last adherence delivery included:  Ezetimibe (Zetia) 10 mg: Take one tab daily at breakfast Montelukast (Singulair) 10 mg - Take one tablet at Bedtime Metropolol Succinate (Toprolol-XL) 25 mg - Take one tablet at Breakfast Fluoxetine (Prozac) 20 mg - Take one capsule at Breakfast Lisinopril-HCTZ (Zestoric) 20-12.5 mg - Take two tablets at Breakfast Alprazolam (Xanax) - Take 1-2 tablets per day as needed Xarelto   Patient declined the following medications due to abundance Albuterol HFA inhaler - use as needed  Patient is due for next adherence delivery on: 03/18/22. Called patient and reviewed medications and coordinated delivery. Packs 30 DS  This delivery to include: Ezetimibe (Zetia) 10 mg: Take one tab daily at breakfast Xarelto 20 mg: Take one tab at dinner Montelukast (Singulair) 10 mg :Take one tablet at Bedtime Metropolol Succinate (Toprolol-XL) 25 mg - Take one tablet at Breakfast Fluoxetine (Prozac) 20 mg : Take one capsule at Breakfast Lisinopril-HCTZ (Zestoric) 20-12.5 mg : Take two tablets at Breakfast Alprazolam (Xanax) .25 mg :Take 1-2 tablets per day as needed   Confirmed delivery date  of 03/18/22, advised patient that pharmacy will contact them the morning of delivery.  Notes: Patient reports she has been approved for medicare Xtra Help  Care Gaps: Zoster Vaccine - Overdue COVID Booster - Overdue Flu Vaccine - Overdue Mammogram - Postponed TDAP - Postponed BP- 110/74 01/12/22 CCM- 2/24  Star Rating Drugs: Lisinopril- HCTZ 20/12.5  mg - Last filled 02/10/22 30 DS at [facility] Pharmacist Assistant [phone]

## 2022-03-09 NOTE — Telephone Encounter (Signed)
Last OV 05/08/21

## 2022-03-15 NOTE — Telephone Encounter (Signed)
Patient states she is returning a call to [facility]. [employer] needs another copy of [place] patient assistance formbecause something was scratched out on the original copy.

## 2022-03-19 DIAGNOSIS — G4733 Obstructive sleep apnea (adult) (pediatric): Secondary | ICD-10-CM | POA: Diagnosis not present

## 2022-03-19 NOTE — Telephone Encounter (Signed)
Called patient letting her know we have received forms from [facility] 03-18-2022 @ 2:51p.m and that we need her to come and sign release of information sheet and also pay the 29.00 fee. She stated she will be in, no specific day.

## 2022-03-19 NOTE — Telephone Encounter (Signed)
Form faxed again. Received fax that patient is eligible through 06/06/22

## 2022-03-22 ENCOUNTER — Telehealth: Payer: Self-pay | Admitting: Cardiology

## 2022-03-22 DIAGNOSIS — Z0279 Encounter for issue of other medical certificate: Secondary | ICD-10-CM

## 2022-03-22 NOTE — Telephone Encounter (Signed)
03-22-22- Patient signed and paid for FMLA/DISABILITY forms. Made a copy and placed in [place] box today 03-22-22

## 2022-03-24 NOTE — Telephone Encounter (Signed)
Patient made aware of approval. She already received medication from [place] and gave first injection. Will do labs same day as pt with Dr. Johney Frame.

## 2022-03-25 ENCOUNTER — Telehealth: Payer: Self-pay | Admitting: Cardiology

## 2022-03-25 NOTE — Telephone Encounter (Signed)
Completed [employer] form scanned into patient's chart.

## 2022-04-06 ENCOUNTER — Telehealth: Payer: Self-pay | Admitting: Pharmacist

## 2022-04-06 NOTE — Chronic Care Management (AMB) (Signed)
Chronic Care Management Pharmacy Assistant   Name: Glenda Bauer  MRN: [id number] DOB: [birth year]  Reason for Encounter: Medication Review Medication Coordination   Conditions to be addressed/monitored: HTN  Recent office visits:  None  Recent consult visits:  None  Hospital visits:  None in previous 6 months  Medications: Outpatient Encounter Medications as of 04/06/2022  Medication Sig   albuterol (VENTOLIN HFA) 108 (90 Base) MCG/ACT inhaler INHALE TWO PUFFS BY MOUTH INTO LUNGS EVERY 6 HOURS AS NEEDED FOR SHORTNESS OF BREATH OR FOR WHEEZING   ALPRAZolam (XANAX) 0.25 MG tablet TAKE 1 TO 2 TABLETS BY MOUTH ONCE daily AS NEEDED   budesonide-formoterol (SYMBICORT) 160-4.5 MCG/ACT inhaler Inhale 2 puffs into the lungs 2 (two) times daily.   ezetimibe (ZETIA) 10 MG tablet Take 1 tablet (10 mg total) by mouth daily.   famotidine (PEPCID) 20 MG tablet Take 20 mg by mouth daily.   FLUoxetine (PROZAC) 20 MG capsule TAKE ONE CAPSULE BY MOUTH ONCE DAILY   fluticasone (FLONASE) 50 MCG/ACT nasal spray Place 2 sprays into both nostrils as needed.   levalbuterol (XOPENEX) 0.63 MG/3ML nebulizer solution Take 3 mLs (0.63 mg total) by nebulization every 6 (six) hours as needed for wheezing or shortness of breath.   levocetirizine (XYZAL) 5 MG tablet Take 5 mg by mouth as needed for allergies.   lisinopril-hydrochlorothiazide (ZESTORETIC) 20-12.5 MG tablet Take 2 tablets by mouth daily.   metoprolol succinate (TOPROL-XL) 25 MG 24 hr tablet Take 1 tablet (25 mg total) by mouth daily. Take with or immediately following a meal.   montelukast (SINGULAIR) 10 MG tablet TAKE ONE TABLET BY MOUTH EVERYDAY AT BEDTIME   Multiple Vitamin (MULTIVITAMIN) tablet Take 1 tablet by mouth daily.   Multiple Vitamins-Minerals (VISION FORMULA PO) Take by mouth daily.    Omega-3 Fatty Acids (FISH OIL PO) Take 1 capsule by mouth daily.   rivaroxaban (XARELTO) 20 MG TABS tablet Take 1 tablet (20 mg total) by  mouth every evening.   triamcinolone cream (KENALOG) 0.1 % APPLY TOPICALLY TWICE DAILY   No facility-administered encounter medications on file as of 04/06/2022.   Reviewed chart for medication changes ahead of medication coordination call.   BP Readings from Last 3 Encounters:  01/12/22 110/74  12/23/21 (!) 210/64  12/02/21 110/70    Lab Results  Component Value Date   HGBA1C 5.6 05/08/2021   Reviewed chart prior to disease state call. Spoke with patient regarding BP  Recent Office Vitals: BP Readings from Last 3 Encounters:  01/12/22 110/74  12/23/21 (!) 210/64  12/02/21 110/70   Pulse Readings from Last 3 Encounters:  01/12/22 (!) 59  12/23/21 (!) 53  12/02/21 (!) 56    Wt Readings from Last 3 Encounters:  01/12/22 181 lb 3.2 oz (82.2 kg)  12/23/21 180 lb 9.6 oz (81.9 kg)  12/02/21 183 lb 3.2 oz (83.1 kg)     Kidney Function Lab Results  Component Value Date/Time   CREATININE 0.99 11/20/2021 08:47 AM   CREATININE 0.94 05/25/2021 09:46 AM   CREATININE 0.89 03/27/2020 09:17 AM   GFR 62.30 05/08/2021 09:34 AM   GFRNONAA 59 (L) 05/21/2020 11:21 AM   GFRNONAA >60 04/17/2020 12:30 PM   GFRNONAA 65 03/27/2020 09:17 AM   GFRAA 68 05/21/2020 11:21 AM   GFRAA 76 03/27/2020 09:17 AM       Latest Ref Rng & Units 11/20/2021    8:47 AM 05/25/2021    9:46 AM 05/08/2021    9:34 AM  BMP  Glucose 70 - 99 mg/dL 98  107  99   BUN 8 - 27 mg/dL '23  28  20   '$ Creatinine 0.57 - 1.00 mg/dL 0.99  0.94  0.92   BUN/Creat Ratio 12 - '28 23  30    '$ Sodium 134 - 144 mmol/L 140  142  140   Potassium 3.5 - 5.2 mmol/L 3.7  3.7  4.0   Chloride 96 - 106 mmol/L 103  103  104   CO2 20 - 29 mmol/L '24  23  28   '$ Calcium 8.7 - 10.3 mg/dL 9.2  9.5  10.1     Current antihypertensive regimen:  Lisinopril - HCTZ 20-12.5 mg 2 tablets daily - Appropriate, Effective, Safe, Accessible Metoprolol succinate 25 mg 1 tablet daily - Appropriate, Effective, Safe, Accessible How often are you checking your  Blood Pressure? 1-2x per week Current home BP readings: 120/80, she denies any hypotensive symptoms she reports she will get a little dizzy if she tries to get up too quickly but is working on taking time doing so. She also states she feels like her skin is drying out and has some blood work scheduled soon to see what may be causing it. What recent interventions/DTPs have been made by any provider to improve Blood Pressure control since last CPP Visit: Patient reports none   Adherence Review: Is the patient currently on ACE/ARB medication? Yes Does the patient have >5 day gap between last estimated fill dates? No    Patient obtains medications through Adherence Packaging  30 Days   Last adherence delivery included:  Ezetimibe (Zetia) 10 mg: Take one tab daily at breakfast Xarelto 20 mg: Take one tab at dinner Montelukast (Singulair) 10 mg :Take one tablet at Bedtime Metropolol Succinate (Toprolol-XL) 25 mg - Take one tablet at Breakfast Fluoxetine (Prozac) 20 mg : Take one capsule at Breakfast Lisinopril-HCTZ (Zestoric) 20-12.5 mg : Take two tablets at Breakfast Alprazolam (Xanax) .25 mg :Take 1-2 tablets per day as needed     Confirmed delivery date of 03/18/22, advised patient that pharmacy will contact them the morning of delivery.   Notes: Patient reports she has been approved for medicare Xtra Help   Patient is due for next adherence delivery on: 04/16/22 . Called patient and reviewed medications and coordinated delivery. Packs 30 DS  This delivery to include: Ezetimibe (Zetia) 10 mg: Take one tab daily at breakfast Xarelto 20 mg: Take one tab at dinner Montelukast (Singulair) 10 mg :Take one tablet at Bedtime Metropolol Succinate (Toprolol-XL) 25 mg - Take one tablet at Breakfast Fluoxetine (Prozac) 20 mg : Take one capsule at Breakfast Lisinopril-HCTZ (Zestoric) 20-12.5 mg : Take two tablets at Breakfast Alprazolam (Xanax) .25 mg :Take 1-2 tablets per day as  needed  Confirmed delivery date of 04/16/22, advised patient that pharmacy will contact them the morning of delivery.   Care Gaps: Zoster Vaccine - Overdue COVID Booster - Overdue Flu Vaccine - Overdue TDAP - Postponed Mammogram - Postponed CCM- 2/24 BP- 120/80 04/06/22 (home)  Star Rating Drugs: Lisinopril- HCTZ 20/12.5 mg - Last filled 03/16/22 30 DS at [facility] Pharmacist Assistant [phone]

## 2022-04-12 ENCOUNTER — Other Ambulatory Visit: Payer: Self-pay | Admitting: Surgery

## 2022-04-12 DIAGNOSIS — I7121 Aneurysm of the ascending aorta, without rupture: Secondary | ICD-10-CM

## 2022-04-19 DIAGNOSIS — G4733 Obstructive sleep apnea (adult) (pediatric): Secondary | ICD-10-CM | POA: Diagnosis not present

## 2022-04-21 ENCOUNTER — Other Ambulatory Visit: Payer: Self-pay | Admitting: *Deleted

## 2022-04-21 ENCOUNTER — Ambulatory Visit: Payer: Medicare Other | Attending: Cardiology

## 2022-04-21 DIAGNOSIS — D6869 Other thrombophilia: Secondary | ICD-10-CM | POA: Diagnosis not present

## 2022-04-21 DIAGNOSIS — I480 Paroxysmal atrial fibrillation: Secondary | ICD-10-CM

## 2022-04-21 DIAGNOSIS — Z01812 Encounter for preprocedural laboratory examination: Secondary | ICD-10-CM | POA: Diagnosis not present

## 2022-04-21 LAB — BASIC METABOLIC PANEL
BUN/Creatinine Ratio: 23 (ref 12–28)
BUN: 21 mg/dL (ref 8–27)
CO2: 25 mmol/L (ref 20–29)
Calcium: 9.8 mg/dL (ref 8.7–10.3)
Chloride: 102 mmol/L (ref 96–106)
Creatinine, Ser: 0.9 mg/dL (ref 0.57–1.00)
Glucose: 102 mg/dL — ABNORMAL HIGH (ref 70–99)
Potassium: 4 mmol/L (ref 3.5–5.2)
Sodium: 140 mmol/L (ref 134–144)
eGFR: 68 mL/min/{1.73_m2} (ref >59)

## 2022-04-22 ENCOUNTER — Telehealth: Payer: Self-pay | Admitting: Cardiology

## 2022-04-22 LAB — CBC WITH DIFFERENTIAL/PLATELET
Basophils Absolute: 0.1 10*3/uL (ref 0.0–0.2)
Basos: 1 %
EOS (ABSOLUTE): 0.2 10*3/uL (ref 0.0–0.4)
Eos: 4 %
Hematocrit: 40.4 % (ref 34.0–46.6)
Hemoglobin: 14.2 g/dL (ref 11.1–15.9)
Immature Grans (Abs): 0 10*3/uL (ref 0.0–0.1)
Immature Granulocytes: 1 %
Lymphocytes Absolute: 1.5 10*3/uL (ref 0.7–3.1)
Lymphs: 35 %
MCH: 30.7 pg (ref 26.6–33.0)
MCHC: 35.1 g/dL (ref 31.5–35.7)
MCV: 87 fL (ref 79–97)
Monocytes Absolute: 0.6 10*3/uL (ref 0.1–0.9)
Monocytes: 13 %
Neutrophils Absolute: 2 10*3/uL (ref 1.4–7.0)
Neutrophils: 46 %
Platelets: 278 10*3/uL (ref 150–450)
RBC: 4.63 x10E6/uL (ref 3.77–5.28)
RDW: 12 % (ref 11.7–15.4)
WBC: 4.4 10*3/uL (ref 3.4–10.8)

## 2022-04-22 NOTE — Telephone Encounter (Signed)
Calling to say the dr to fill out her forms correctly for [place].Please advise

## 2022-04-22 NOTE — Telephone Encounter (Signed)
Called pt in regards to paperwork.  Reports Question #4 was not filled out.  Advised I will send message to RN to follow up.

## 2022-04-23 NOTE — Telephone Encounter (Signed)
Apologized for missing #4. Aware it will be filled in and faxed back to [place] appreciates the help.

## 2022-04-27 NOTE — Telephone Encounter (Signed)
Form has been re-faxed. Confirmation received

## 2022-04-27 NOTE — Telephone Encounter (Signed)
Calling to ask that we refax info over. The paperwork came across blurry

## 2022-05-05 ENCOUNTER — Ambulatory Visit (HOSPITAL_BASED_OUTPATIENT_CLINIC_OR_DEPARTMENT_OTHER)
Admission: RE | Admit: 2022-05-05 | Discharge: 2022-05-05 | Disposition: A | Discharge status: Home / Self Care | Payer: Medicare Other | Source: Ambulatory Visit | Attending: Cardiology | Admitting: Cardiology

## 2022-05-05 ENCOUNTER — Other Ambulatory Visit: Payer: Self-pay

## 2022-05-05 ENCOUNTER — Encounter (HOSPITAL_BASED_OUTPATIENT_CLINIC_OR_DEPARTMENT_OTHER): Payer: Self-pay

## 2022-05-05 DIAGNOSIS — I480 Paroxysmal atrial fibrillation: Secondary | ICD-10-CM

## 2022-05-05 MED ORDER — IOHEXOL 350 MG/ML SOLN
100.0000 mL | Freq: Once | INTRAVENOUS | Status: AC | PRN
  Administered 2022-05-05: 80 mL via INTRAVENOUS

## 2022-05-05 MED ORDER — METOPROLOL SUCCINATE ER 25 MG PO TB24: 25.0000 mg | ORAL_TABLET | Freq: Every day | ORAL | 1 refills | Status: DC

## 2022-05-05 MED ORDER — LISINOPRIL-HYDROCHLOROTHIAZIDE 20-12.5 MG PO TABS: 2.0000 | ORAL_TABLET | Freq: Every day | ORAL | 1 refills | Status: DC

## 2022-05-05 NOTE — Addendum Note (Signed)
Addended by: Carter Kitten D on: 05/05/2022 01:28 PM   Modules accepted: Orders

## 2022-05-06 ENCOUNTER — Telehealth: Payer: Self-pay | Admitting: Pharmacist

## 2022-05-06 NOTE — Progress Notes (Signed)
Chronic Care Management Pharmacy Assistant   Name: Glenda Bauer  MRN: [id number] DOB: September 02, 1950  Reason for Encounter: Medication Review Medication Coordination   Recent office visits:  None  Recent consult visits:  05/05/22 Constance Haw, MD - Patient presented to [place] for CT Cardiac Morph/Pulm Vein W/C. No medication changes.   Hospital visits:  None in previous 6 months  Medications: Outpatient Encounter Medications as of 05/06/2022  Medication Sig   albuterol (VENTOLIN HFA) 108 (90 Base) MCG/ACT inhaler INHALE TWO PUFFS BY MOUTH INTO LUNGS EVERY 6 HOURS AS NEEDED FOR SHORTNESS OF BREATH OR FOR WHEEZING   ALPRAZolam (XANAX) 0.25 MG tablet TAKE 1 TO 2 TABLETS BY MOUTH ONCE daily AS NEEDED   budesonide-formoterol (SYMBICORT) 160-4.5 MCG/ACT inhaler Inhale 2 puffs into the lungs 2 (two) times daily.   ezetimibe (ZETIA) 10 MG tablet Take 1 tablet (10 mg total) by mouth daily.   famotidine (PEPCID) 20 MG tablet Take 20 mg by mouth daily.   FLUoxetine (PROZAC) 20 MG capsule TAKE ONE CAPSULE BY MOUTH ONCE DAILY   fluticasone (FLONASE) 50 MCG/ACT nasal spray Place 2 sprays into both nostrils as needed.   levalbuterol (XOPENEX) 0.63 MG/3ML nebulizer solution Take 3 mLs (0.63 mg total) by nebulization every 6 (six) hours as needed for wheezing or shortness of breath.   levocetirizine (XYZAL) 5 MG tablet Take 5 mg by mouth as needed for allergies.   lisinopril-hydrochlorothiazide (ZESTORETIC) 20-12.5 MG tablet Take 2 tablets by mouth daily.   metoprolol succinate (TOPROL-XL) 25 MG 24 hr tablet Take 1 tablet (25 mg total) by mouth daily. Take with or immediately following a meal.   montelukast (SINGULAIR) 10 MG tablet TAKE ONE TABLET BY MOUTH EVERYDAY AT BEDTIME   Multiple Vitamin (MULTIVITAMIN) tablet Take 1 tablet by mouth daily.   Multiple Vitamins-Minerals (VISION FORMULA PO) Take by mouth daily.    Omega-3 Fatty Acids (FISH OIL PO) Take 1  capsule by mouth daily.   rivaroxaban (XARELTO) 20 MG TABS tablet Take 1 tablet (20 mg total) by mouth every evening.   triamcinolone cream (KENALOG) 0.1 % APPLY TOPICALLY TWICE DAILY   No facility-administered encounter medications on file as of 05/06/2022.   Reviewed chart for medication changes ahead of medication coordination call.  No OVs, Consults, or hospital visits since last care coordination call/Pharmacist visit. (If appropriate, list visit date, provider name)  No medication changes indicated OR if recent visit, treatment plan here.  BP Readings from Last 3 Encounters:  01/12/22 110/74  12/23/21 (!) 210/64  12/02/21 110/70    Lab Results  Component Value Date   HGBA1C 5.6 05/08/2021     Patient obtains medications through Adherence Packaging  30 Days   Last adherence delivery included: Ezetimibe (Zetia) 10 mg: Take one tab daily at breakfast Xarelto 20 mg: Take one tab at dinner Montelukast (Singulair) 10 mg :Take one tablet at Bedtime Metropolol Succinate (Toprolol-XL) 25 mg - Take one tablet at Breakfast Fluoxetine (Prozac) 20 mg : Take one capsule at Breakfast Lisinopril-HCTZ (Zestoric) 20-12.5 mg : Take two tablets at Breakfast Alprazolam (Xanax) .25 mg :Take 1-2 tablets per day as needed   Confirmed delivery date of 04/16/22, advised patient that pharmacy will contact them the morning of delivery.     Patient is due for next adherence delivery on: 05/18/22. Called patient and reviewed medications and coordinated delivery. Packs 30 DS  This delivery to include: Ezetimibe (Zetia) 10 mg: Take one tab daily at breakfast  Xarelto 20 mg: Take one tab at dinner Montelukast (Singulair) 10 mg :Take one tablet at Bedtime Metropolol Succinate (Toprolol-XL) 25 mg - Take one tablet at Breakfast Fluoxetine (Prozac) 20 mg : Take one capsule at Breakfast Lisinopril-HCTZ (Zestoric) 20-12.5 mg : Take two tablets at Breakfast Alprazolam (Xanax) .25 mg :Take 1-2 tablets per  day as needed    Confirmed delivery date of 05/18/22 , advised patient that pharmacy will contact them the morning of delivery.   Care Gaps: TDAP - Overdue Zoster Vaccine - Overdue Flu Vaccine - Overdue COVID Booster - Overdue Mammogram - Postponed BP-120/80 04/06/22 (home)  AWV- 10/08/21 CCM- 07/12/22   Star Rating Drugs: Lisinopril- HCTZ 20/12.5 mg - Last filled 03/16/22 30 DS at [place] Pharmacist Assistant [phone]

## 2022-05-07 NOTE — Progress Notes (Unsigned)
Cardiology Office Note:    Date:  05/07/2022   ID:  Glenda Bauer, DOB 29-May-1951, MRN [id number]  PCP:  [name], NP  [facility] Cardiologist:  [name], MD  [facility] Electrophysiologist:  None   Referring MD: [name], NP    History of Present Illness:    Glenda Bauer is a 71 y.o. female with a hx of HTN, cutaneous T-cell lymphoma, ascending aortic aneurysm, and paroxysmal atrial fibrillation who presents to clinic for CV follow-up.  Per review of the record, the patient was initially diagnosed with atrial fibrillation 03/27/20 at her routine PCP visit. ECG showed afib HR 137. Patient was started on Xarelto for a CHADS2VASC score of 3 and metoprolol for rate control.   Followed by Afib clinic and was initiated on flecainide with return to NSR.   Seen in clinic on 05/2022 where she was having more dyspnea and fatigue with exertion. We obtained a cardiac CTA on 06/15/21 which showed severe, soft plaque with >70% stenosis in mLAD. FFR negative. Was recommended for continued aggressive medical therapy.  Was last seen in clinic on 01/2022 by [facility] where she was suffering from more frequent palpitations. Wished to pursue ablation.  Today, ***   Past Medical History:  Diagnosis Date   Allergy    Anxiety    Asthma    Atrial fibrillation ([place])    Cancer (HCC)    Melanoma and Mycosis Fungosis   Cataract    Cutaneous T-cell lymphoma ([place]) 2011   Dermatologist treated in [place]; Dr. Hipolito Bayley   Depression    GERD (gastroesophageal reflux disease)    Heart murmur    Hyperlipidemia    Hypertension    Neuromuscular disorder ([place])    Osteoarthritis    Sebaceous cyst 2022   right vulva   Thyroid nodule    Urinary tract infection     Past Surgical History:  Procedure Laterality Date   Cataracts Bilateral    CHOLECYSTECTOMY  2015   melenoma removal  1979   Sinus Polyps       Current Medications: No outpatient  medications have been marked as taking for the 05/11/22 encounter (Appointment) with [name], MD.     Allergies:   Patient has no known allergies.   Social History   Socioeconomic History   Marital status: Divorced    Spouse name: Not on file   Number of children: 3   Years of education: 16   Highest education level: Associate degree: academic program  Occupational History   Occupation: Medical sales representative   Tobacco Use   Smoking status: Former    Packs/day: 0.25    Years: 10.00    Total pack years: 2.50    Types: Cigarettes    Quit date: 06/07/1981    Years since quitting: 40.9    Passive exposure: Past   Smokeless tobacco: Never   Tobacco comments:    Former smoker 12/02/21  Vaping Use   Vaping Use: Never used  Substance and Sexual Activity   Alcohol use: No    Alcohol/week: 0.0 standard drinks of alcohol   Drug use: No   Sexual activity: Not Currently    Birth control/protection: Post-menopausal  Other Topics Concern   Not on file  Social History Narrative   Separated    Three children ( one lives locally)    Social Determinants of Health   Financial Resource Strain: Medium Risk (01/28/2022)   Overall Financial Resource Strain (CARDIA)    Difficulty  of Paying Living Expenses: Somewhat hard  Food Insecurity: No Food Insecurity (10/08/2021)   Hunger Vital Sign    Worried About Running Out of Food in the Last Year: Never true    Ran Out of Food in the Last Year: Never true  Transportation Needs: No Transportation Needs (10/08/2021)   PRAPARE - Hydrologist (Medical): No    Lack of Transportation (Non-Medical): No  Physical Activity: Insufficiently Active (10/08/2021)   Exercise Vital Sign    Days of Exercise per Week: 3 days    Minutes of Exercise per Session: 20 min  Stress: Stress Concern Present (10/08/2021)   [place]    Feeling of Stress : To some extent  Social  Connections: Socially Isolated (10/08/2021)   Social Connection and Isolation Panel [NHANES]    Frequency of Communication with Friends and Family: More than three times a week    Frequency of Social Gatherings with Friends and Family: More than three times a week    Attends Religious Services: Never    Marine scientist or Organizations: No    Attends Music therapist: Never    Marital Status: Divorced     Family History: The patient's family history includes Arthritis in her mother; Breast cancer (age of onset: 54) in her mother; Crohn's disease in her brother; Ovarian cancer in her mother; Parkinson's disease in her father; Schizophrenia in her brother; Thyroid disease in her mother. There is no history of Colon cancer, Esophageal cancer, Rectal cancer, or Stomach cancer.  ROS:   Please see the history of present illness.    Review of Systems  Constitutional:  Positive for malaise/fatigue. Negative for chills and fever.  HENT:  Negative for nosebleeds and sinus pain.   Eyes:  Negative for blurred vision.  Respiratory:  Negative for sputum production and shortness of breath.   Cardiovascular:  Negative for chest pain, palpitations, orthopnea, claudication, leg swelling and PND.  Gastrointestinal:  Negative for blood in stool and melena.  Genitourinary:  Negative for hematuria.  Musculoskeletal:  Positive for myalgias.  Neurological:  Negative for dizziness and loss of consciousness.  Endo/Heme/Allergies:  Bruises/bleeds easily.    EKGs/Labs/Other Studies Reviewed:    The following studies were reviewed today: Cardiac CTR 06/2021: FINDINGS: A 120 kV prospective scan was triggered in the descending thoracic aorta at 111 HU's. Axial non-contrast 3 mm slices were carried out through the heart. The data set was analyzed on a dedicated work station and scored using the [place]. Gantry rotation speed was 250 msecs and collimation was .6 mm. No beta blockade and  0.8 mg of sl NTG was given. The 3D data set was reconstructed in 5% intervals of the 67-82 % of the R-R cycle. Diastolic phases were analyzed on a dedicated work station using MPR, MIP and VRT modes. The patient received 80 cc of contrast.   Aorta: Ascending aorta moderately dilated. Aortic atherosclerosis. No dissection.   Aortic Valve:  Trileaflet.  No calcifications.   Coronary Arteries:  Normal coronary origin.  Right dominance.   RCA is a large dominant artery that gives rise to PDA and PLVB. There is no plaque.   Left main is a large artery that gives rise to LAD, and LCX arteries. There is minimal (<25%) calcified plaque.   LAD is a large vessel that has minimal (<25%) mixed plaque proximally. There is severe (>70%) soft plaque in the mid  LAD. There are two small diagonal vessels without significant obstructive disease.   LCX is a non-dominant artery that gives rise to two small OM branches. There is no plaque.   Coronary Calcium Score:   Left main: 224   Left anterior descending artery: 319   Left circumflex artery: 0   Right coronary artery: 0   Total: 543   Percentile: 91st   Other findings:   Normal pulmonary vein drainage into the left atrium.   Normal let atrial appendage without a thrombus.   Normal size of the pulmonary artery.   Mild mitral annular calcification.   IMPRESSION: 1. Coronary calcium score of 543. This was 91st percentile for age-, race-, and sex-matched controls.   2. Normal coronary origin with right dominance.   3.  Ascending aorta aneurysm (4.4cm.)   4. Severe (>70%) soft plaque in the mid LAD. Minimal (<25%) plaque in LM and proximal LAD. CAD-RADS 4.   5.  Will send study for FFRct.  FFR: FINDINGS: FFRct analysis was performed on the original cardiac CT angiogram dataset. Diagrammatic representation of the FFRct analysis is provided in a separate PDF document in PACS. This dictation was created using the PDF  document and an interactive 3D model of the results. 3D model is not available in the EMR/PACS. Normal FFR range is >0.80.   1. Left Main: FFRct 1.0   2. LAD: FFRct 0.97 proximally, 0.96 mid, 0.82 distal   3. LCX: FFRct 0.99 proximally, 096 mid   4. RCA: FFRct 0.99 proximally, 0.94 mid, 0.89 distal.   IMPRESSION: 1.  FFRct findings are consistent with non-obstructive disease.   2. Recommend aggressive risk factor modification, including LDL goal <70.  MRA of aorta 11/13/20: FINDINGS: VASCULAR   Aorta: Tortuous thoracic aorta with type 3 arch. No pedunculated plaque. No periaortic fluid. No dissection.   The estimated diameter of the ascending aorta is 45 mm. No significant atherosclerotic changes.   Heart: Heart size within normal limits.  No pericardial fluid.   Pulmonary Arteries: Flow signal of the pulmonary arteries unremarkable. Main pulmonary artery measures 32 mm.   NON-VASCULAR   Pleura/lungs: No pleural fluid.   Nodular changes at the bilateral lung apices, pleuroparenchymal interface, poorly characterized on MR.   No confluent airspace disease.   Mediastinum: No adenopathy. Unremarkable thoracic inlet. Hiatal hernia.   Musculoskeletal: Relatively uniform signal within the visualized spinal vertebral bodies. No narrowing of the spinal canal.   Unremarkable appearance of the chest wall with no adenopathy.   Upper abdomen: Unremarkable   IMPRESSION: Estimated maximum diameter of the ascending aorta 45 mm. Recommend semi-annual imaging followup by CTA or MRA and referral to cardiothoracic surgery if not already obtained. This recommendation follows 2010 ACCF/AHA/AATS/ACR/ASA/SCA/SCAI/SIR/STS/SVM Guidelines for the Diagnosis and Management of Patients With Thoracic Aortic Disease. Circulation. 2010; 121: O841-Y606. Aortic aneurysm NOS (ICD10-I71.9)   Lung nodularity at the bilateral lung apices, poorly characterized by MR. If MR surveillance of the  aorta is elected, suggest at least chest x-ray documentation of what is presumably benign scarring at the lung apices. Otherwise, this could be characterized on surveillance CT angiogram.  TTE 04/08/20: IMPRESSIONS   1. Left ventricular ejection fraction, by estimation, is 50 to 55%. The  left ventricle has low normal function. The left ventricle has no regional  wall motion abnormalities. Left ventricular diastolic parameters are  indeterminate.   2. Right ventricular systolic function is normal. The right ventricular  size is normal. There is normal pulmonary artery systolic  pressure.   3. The mitral valve is normal in structure. Mild to moderate mitral valve  regurgitation. No evidence of mitral stenosis.   4. The aortic valve is normal in structure. Aortic valve regurgitation is  mild to moderate. No aortic stenosis is present.   5. Aortic dilatation noted. There is mild to moderate dilatation of the  ascending aorta, measuring 44 mm.  EKG:  No new ECG today  Recent Labs: 05/08/2021: ALT 29; TSH 0.86 04/21/2022: BUN 21; Creatinine, Ser 0.90; Hemoglobin 14.2; Platelets 278; Potassium 4.0; Sodium 140  Recent Lipid Panel    Component Value Date/Time   CHOL 145 05/08/2021 0934   CHOL 122 01/09/2021 0911   TRIG 79.0 05/08/2021 0934   HDL 68.80 05/08/2021 0934   HDL 65 01/09/2021 0911   CHOLHDL 2 05/08/2021 0934   VLDL 15.8 05/08/2021 0934   LDLCALC 61 05/08/2021 0934   LDLCALC 45 01/09/2021 0911   LDLCALC 110 (H) 03/27/2020 0917     Risk Assessment/Calculations:    CHA2DS2-VASc Score = 4  This indicates a 4.8% annual risk of stroke. The patient's score is based upon: CHF History: 0 HTN History: 1 Diabetes History: 0 Stroke History: 0 Vascular Disease History: 1 Age Score: 1 Gender Score: 1     Physical Exam:    VS:  LMP 06/07/2000 (Approximate)     Wt Readings from Last 3 Encounters:  01/12/22 181 lb 3.2 oz (82.2 kg)  12/23/21 180 lb 9.6 oz (81.9 kg)   12/02/21 183 lb 3.2 oz (83.1 kg)     GEN:  Comfortable, NAD HEENT: Normal NECK: No JVD; No carotid bruits CARDIAC: RRR, 2/6 systolic murmur. No rubs or gallops RESPIRATORY:  CTAB, no wheezes ABDOMEN: Soft, non-tender, non-distended MUSCULOSKELETAL:  Warm, no edema  SKIN: Warm and dry NEUROLOGIC:  Alert and oriented x 3 PSYCHIATRIC:  Normal affect   ASSESSMENT:    No diagnosis found.   PLAN:    In order of problems listed above:  #CAD: Coronary CTA 06/2021 with >70% mid LAD lesion with negative FFR. Ca score 543 (91%). Recommended for continued aggressive medical therapy. -Did not tolerate statin therapy -Now on zetia '10mg'$  and repatha '140mg'$  every 2 weeks -Not on ASA due to need for xarelto  #Paroxysmal Afib: CHADs-vasc 4. Was maintaining sinus rhythm since starting flecainide, however, was found to have >70%mLAD lesion on CTA and thus the medication was stopped. She now wishes to proceed with ablation with Dr. Curt Bears. On xarelto for [facility] which she is tolerating well. -Plan for ablation with Dr. Curt Bears -Flec stopped due to significant LAD disease on CTA -Continue xarelto for [facility] -Continue metop '25mg'$  XL -Follow-up with Afib clinic as scheduled  #Mild-to-moderate ascending aortic dilation Measures 22m on TTE and 470mon MRA chest 11/12/20. CTA 06/15/21 with stable size 4418m-Followed by Dr. BarCyndia Bentggressive blood pressure control as detailed above -Avoid heavy lifting >30lbs; discussed with the patient -Okay for weight bearing strength exercises like pilates  #Mild-to-moderate MR: Mild on TTE 12/2021. -Continue serial monitoring with TTE 12/2023  #HTN Well controlled at 120/70s. Initial orthostatic symptoms that have since improved.  -Decrease lisinopril-HTCZ '20mg'$ -12.'5mg'$  to 1 pill daily -Continue metop '25mg'$  XL  #HLD: Had myalgias with crestor. Goal LDL <70 given significant CAD on coronary CTA.  -Continue zetia '10mg'$  daily -Continue repatha '140mg'$  every 2  weeks -Lipids today -Follows with Dr. MacPrentiss BellsMedication Adjustments/Labs and Tests Ordered: Current medicines are reviewed at length with the patient today.  Concerns  regarding medicines are outlined above.  No orders of the defined types were placed in this encounter.   No orders of the defined types were placed in this encounter.    There are no Patient Instructions on file for this visit.     Signed, [name], MD  05/07/2022 10:01 AM    [place]

## 2022-05-11 ENCOUNTER — Other Ambulatory Visit: Payer: Medicare Other

## 2022-05-11 ENCOUNTER — Encounter: Payer: Self-pay | Admitting: Cardiology

## 2022-05-11 ENCOUNTER — Ambulatory Visit: Payer: Medicare Other | Attending: Cardiology | Admitting: Cardiology

## 2022-05-11 VITALS — BP 100/60 | HR 71 | Ht 66.0 in | Wt 172.6 lb

## 2022-05-11 DIAGNOSIS — I77819 Aortic ectasia, unspecified site: Secondary | ICD-10-CM | POA: Diagnosis not present

## 2022-05-11 DIAGNOSIS — I712 Thoracic aortic aneurysm, without rupture, unspecified: Secondary | ICD-10-CM

## 2022-05-11 DIAGNOSIS — I2510 Atherosclerotic heart disease of native coronary artery without angina pectoris: Secondary | ICD-10-CM

## 2022-05-11 DIAGNOSIS — I10 Essential (primary) hypertension: Secondary | ICD-10-CM

## 2022-05-11 DIAGNOSIS — D6869 Other thrombophilia: Secondary | ICD-10-CM

## 2022-05-11 DIAGNOSIS — I480 Paroxysmal atrial fibrillation: Secondary | ICD-10-CM

## 2022-05-11 DIAGNOSIS — E785 Hyperlipidemia, unspecified: Secondary | ICD-10-CM | POA: Diagnosis not present

## 2022-05-11 DIAGNOSIS — Z789 Other specified health status: Secondary | ICD-10-CM

## 2022-05-11 LAB — LIPID PANEL
Chol/HDL Ratio: 2.3 ratio (ref 0.0–4.4)
Cholesterol, Total: 124 mg/dL (ref 100–199)
HDL: 54 mg/dL (ref >39)
LDL Chol Calc (NIH): 52 mg/dL (ref 0–99)
Triglycerides: 97 mg/dL (ref 0–149)
VLDL Cholesterol Cal: 18 mg/dL (ref 5–40)

## 2022-05-11 MED ORDER — LISINOPRIL-HYDROCHLOROTHIAZIDE 20-12.5 MG PO TABS: 1.0000 | ORAL_TABLET | Freq: Every day | ORAL | 3 refills | Status: DC

## 2022-05-11 NOTE — Progress Notes (Signed)
Cardiology Office Note:    Date:  05/11/2022   ID:  Glenda Bauer, DOB 05/05/1951, MRN [id number]  PCP:  [name], NP  [facility] Cardiologist:  [name], MD  [facility] Electrophysiologist:  None   Referring MD: [name], NP    History of Present Illness:    Glenda Bauer is a 71 y.o. female with a hx of HTN, cutaneous T-cell lymphoma, ascending aortic aneurysm, and paroxysmal atrial fibrillation who presents to clinic for CV follow-up.  Per review of the record, the patient was initially diagnosed with atrial fibrillation 03/27/20 at her routine PCP visit. ECG showed afib HR 137. Patient was started on Xarelto for a CHADS2VASC score of 3 and metoprolol for rate control.   Followed by Afib clinic and was initiated on flecainide with return to NSR.   Seen in clinic on 05/2022 where she was having more dyspnea and fatigue with exertion. We obtained a cardiac CTA on 06/15/21 which showed severe, soft plaque with >70% stenosis in mLAD. FFR negative. Was recommended for continued aggressive medical therapy.  Was last seen in clinic on 01/2022 by [facility] where she was suffering from more frequent palpitations. Wished to pursue ablation.  Today, she is feeling overall well and is planned for ablation tomorrow. She is overall stable from a CV standpoint. No significant palpitations, chest pain, or SOB. Has occasional dizziness when going from a seated to a standing position but no falls or syncope. Endorses swelling in her right ankle which she usually feels towards the end of the day. She is open to trying compression socks to help with the swelling and to avoid vericose veins.   She is compliantly taking her Xarelto, reporting she has not missed any doses. She also denies any bleeding.   She has been adhering to her Mediterranean style diet which consists  of roasted vegetables and chicken.  She denies any palpitations, chest pain, shortness of  breath, or peripheral edema. No lightheadedness, headaches, syncope, orthopnea, or PND.  Past Medical History:  Diagnosis Date   Allergy    Anxiety    Asthma    Atrial fibrillation ([place])    Cancer (HCC)    Melanoma and Mycosis Fungosis   Cataract    Cutaneous T-cell lymphoma ([place]) 2011   Dermatologist treated in [place]; Dr. Hipolito Bayley   Depression    GERD (gastroesophageal reflux disease)    Heart murmur    Hyperlipidemia    Hypertension    Neuromuscular disorder (HCC)    Osteoarthritis    Sebaceous cyst 2022   right vulva   Thyroid nodule    Urinary tract infection     Past Surgical History:  Procedure Laterality Date   Cataracts Bilateral    CHOLECYSTECTOMY  2015   melenoma removal  1979   Sinus Polyps       Current Medications: Current Meds  Medication Sig   albuterol (VENTOLIN HFA) 108 (90 Base) MCG/ACT inhaler INHALE TWO PUFFS BY MOUTH INTO LUNGS EVERY 6 HOURS AS NEEDED FOR SHORTNESS OF BREATH OR FOR WHEEZING   ALPRAZolam (XANAX) 0.25 MG tablet TAKE 1 TO 2 TABLETS BY MOUTH ONCE daily AS NEEDED   budesonide-formoterol (SYMBICORT) 160-4.5 MCG/ACT inhaler Inhale 2 puffs into the lungs 2 (two) times daily.   ezetimibe (ZETIA) 10 MG tablet Take 1 tablet (10 mg total) by mouth daily.   famotidine (PEPCID) 20 MG tablet Take 20 mg by mouth daily as needed for indigestion or heartburn.   FLUoxetine (  PROZAC) 20 MG capsule TAKE ONE CAPSULE BY MOUTH ONCE DAILY   fluticasone (FLONASE SENSIMIST) 27.5 MCG/SPRAY nasal spray Place 1 spray into the nose in the morning.   levalbuterol (XOPENEX) 0.63 MG/3ML nebulizer solution Take 3 mLs (0.63 mg total) by nebulization every 6 (six) hours as needed for wheezing or shortness of breath.   levocetirizine (XYZAL) 5 MG tablet Take 5 mg by mouth every other day. In the evening.   lisinopril-hydrochlorothiazide (ZESTORETIC) 20-12.5 MG tablet Take 1 tablet by mouth daily.   metoprolol succinate (TOPROL-XL) 25 MG 24 hr tablet Take 1 tablet  (25 mg total) by mouth daily. Take with or immediately following a meal.   montelukast (SINGULAIR) 10 MG tablet TAKE ONE TABLET BY MOUTH EVERYDAY AT BEDTIME   Multiple Vitamin (MULTIVITAMIN) tablet Take 1 tablet by mouth every evening.   Multiple Vitamins-Minerals (VISION FORMULA PO) Take 1 capsule by mouth every evening.   Omega-3 Fatty Acids (FISH OIL PO) Take 1 capsule by mouth daily.   rivaroxaban (XARELTO) 20 MG TABS tablet Take 1 tablet (20 mg total) by mouth every evening.   triamcinolone cream (KENALOG) 0.1 % APPLY TOPICALLY TWICE DAILY (Patient taking differently: Apply 1 Application topically 2 (two) times daily as needed (skin irritation/flares.).)   [DISCONTINUED] lisinopril-hydrochlorothiazide (ZESTORETIC) 20-12.5 MG tablet Take 2 tablets by mouth daily.     Allergies:   Patient has no known allergies.   Social History   Socioeconomic History   Marital status: Divorced    Spouse name: Not on file   Number of children: 3   Years of education: 16   Highest education level: Associate degree: academic program  Occupational History   Occupation: Medical sales representative   Tobacco Use   Smoking status: Former    Packs/day: 0.25    Years: 10.00    Total pack years: 2.50    Types: Cigarettes    Quit date: 06/07/1981    Years since quitting: 40.9    Passive exposure: Past   Smokeless tobacco: Never   Tobacco comments:    Former smoker 12/02/21  Vaping Use   Vaping Use: Never used  Substance and Sexual Activity   Alcohol use: No    Alcohol/week: 0.0 standard drinks of alcohol   Drug use: No   Sexual activity: Not Currently    Birth control/protection: Post-menopausal  Other Topics Concern   Not on file  Social History Narrative   Separated    Three children ( one lives locally)    Social Determinants of Health   Financial Resource Strain: [place] (01/28/2022)   Overall Financial Resource Strain (CARDIA)    Difficulty of Paying Living Expenses: Somewhat hard  Food Insecurity:  No Food Insecurity (10/08/2021)   Hunger Vital Sign    Worried About Running Out of Food in the Last Year: Never true    [place] in the Last Year: Never true  Transportation Needs: No Transportation Needs (10/08/2021)   PRAPARE - Hydrologist (Medical): No    Lack of Transportation (Non-Medical): No  Physical Activity: Insufficiently Active (10/08/2021)   Exercise Vital Sign    Days of Exercise per Week: 3 days    Minutes of Exercise per Session: 20 min  Stress: Stress Concern Present (10/08/2021)   [place]    Feeling of Stress : To some extent  Social Connections: Socially Isolated (10/08/2021)   Social Connection and Isolation Panel [NHANES]  Frequency of Communication with Friends and Family: More than three times a week    Frequency of Social Gatherings with Friends and Family: More than three times a week    Attends Religious Services: Never    Marine scientist or Organizations: No    Attends Music therapist: Never    Marital Status: Divorced     Family History: The patient's family history includes Arthritis in her mother; Breast cancer (age of onset: 8) in her mother; Crohn's disease in her brother; Ovarian cancer in her mother; Parkinson's disease in her father; Schizophrenia in her brother; Thyroid disease in her mother. There is no history of Colon cancer, Esophageal cancer, Rectal cancer, or Stomach cancer.  ROS:   Please see the history of present illness.    Review of Systems  Constitutional:  Negative for chills, fever and malaise/fatigue.  HENT:  Negative for nosebleeds and sinus pain.   Eyes:  Negative for blurred vision.  Respiratory:  Negative for sputum production and shortness of breath.   Cardiovascular:  Positive for leg swelling (right ankle swells by end of the day). Negative for chest pain, palpitations, orthopnea, claudication and PND.   Gastrointestinal:  Negative for blood in stool and melena.  Genitourinary:  Negative for hematuria.  Musculoskeletal:  Negative for myalgias.  Neurological:  Negative for dizziness and loss of consciousness.  Endo/Heme/Allergies:  Does not bruise/bleed easily.    EKGs/Labs/Other Studies Reviewed:    The following studies were reviewed today:  Echo 12/09/2021: IMPRESSIONS   1. Left ventricular ejection fraction, by estimation, is 60 to 65%. The  left ventricle has normal function. The left ventricle has no regional  wall motion abnormalities. Left ventricular diastolic parameters are  consistent with Grade I diastolic  dysfunction (impaired relaxation).   2. Right ventricular systolic function is normal. The right ventricular  size is normal.   3. The mitral valve is grossly normal. Mild mitral valve regurgitation.  No evidence of mitral stenosis.   4. The aortic valve is tricuspid. There is mild calcification of the  aortic valve. There is mild thickening of the aortic valve. Aortic valve  regurgitation is mild to moderate. Aortic valve sclerosis/calcification is  present, without any evidence of  aortic stenosis.   5. Aortic dilatation noted. There is mild-to-moderate dilatation of the  ascending aorta, measuring 45 mm. There is borderline dilatation of the  aortic arch, measuring 39 mm.   6. The inferior vena cava is normal in size with greater than 50%  respiratory variability, suggesting right atrial pressure of 3 mmHg.   Comparison(s): Compared to prior TTE on 04/08/2020, the LVEF appears more vigorous and the MR appears less (prior EF 50-55% with mild-to-moderate MR). There continues to be mild-to-moderate AI.    Cardiac CTR 06/2021: FINDINGS: A 120 kV prospective scan was triggered in the descending thoracic aorta at 111 HU's. Axial non-contrast 3 mm slices were carried out through the heart. The data set was analyzed on a dedicated work station and scored using the  Maury. Gantry rotation speed was 250 msecs and collimation was .6 mm. No beta blockade and 0.8 mg of sl NTG was given. The 3D data set was reconstructed in 5% intervals of the 67-82 % of the R-R cycle. Diastolic phases were analyzed on a dedicated work station using MPR, MIP and VRT modes. The patient received 80 cc of contrast.   Aorta: Ascending aorta moderately dilated. Aortic atherosclerosis. No dissection.   Aortic Valve:  Trileaflet.  No calcifications.   Coronary Arteries:  Normal coronary origin.  Right dominance.   RCA is a large dominant artery that gives rise to PDA and PLVB. There is no plaque.   Left main is a large artery that gives rise to LAD, and LCX arteries. There is minimal (<25%) calcified plaque.   LAD is a large vessel that has minimal (<25%) mixed plaque proximally. There is severe (>70%) soft plaque in the mid LAD. There are two small diagonal vessels without significant obstructive disease.   LCX is a non-dominant artery that gives rise to two small OM branches. There is no plaque.   Coronary Calcium Score:   Left main: 224   Left anterior descending artery: 319   Left circumflex artery: 0   Right coronary artery: 0   Total: 543   Percentile: 91st   Other findings:   Normal pulmonary vein drainage into the left atrium.   Normal let atrial appendage without a thrombus.   Normal size of the pulmonary artery.   Mild mitral annular calcification.   IMPRESSION: 1. Coronary calcium score of 543. This was 91st percentile for age-, race-, and sex-matched controls.   2. Normal coronary origin with right dominance.   3.  Ascending aorta aneurysm (4.4cm.)   4. Severe (>70%) soft plaque in the mid LAD. Minimal (<25%) plaque in LM and proximal LAD. CAD-RADS 4.   5.  Will send study for FFRct.  FFR: FINDINGS: FFRct analysis was performed on the original cardiac CT angiogram dataset. Diagrammatic representation of the FFRct  analysis is provided in a separate PDF document in PACS. This dictation was created using the PDF document and an interactive 3D model of the results. 3D model is not available in the EMR/PACS. Normal FFR range is >0.80.   1. Left Main: FFRct 1.0   2. LAD: FFRct 0.97 proximally, 0.96 mid, 0.82 distal   3. LCX: FFRct 0.99 proximally, 096 mid   4. RCA: FFRct 0.99 proximally, 0.94 mid, 0.89 distal.   IMPRESSION: 1.  FFRct findings are consistent with non-obstructive disease.   2. Recommend aggressive risk factor modification, including LDL goal <70.  MRA of aorta 11/13/20: FINDINGS: VASCULAR   Aorta: Tortuous thoracic aorta with type 3 arch. No pedunculated plaque. No periaortic fluid. No dissection.   The estimated diameter of the ascending aorta is 45 mm. No significant atherosclerotic changes.   Heart: Heart size within normal limits.  No pericardial fluid.   Pulmonary Arteries: Flow signal of the pulmonary arteries unremarkable. Main pulmonary artery measures 32 mm.   NON-VASCULAR   Pleura/lungs: No pleural fluid.   Nodular changes at the bilateral lung apices, pleuroparenchymal interface, poorly characterized on MR.   No confluent airspace disease.   Mediastinum: No adenopathy. Unremarkable thoracic inlet. Hiatal hernia.   Musculoskeletal: Relatively uniform signal within the visualized spinal vertebral bodies. No narrowing of the spinal canal.   Unremarkable appearance of the chest wall with no adenopathy.   Upper abdomen: Unremarkable   IMPRESSION: Estimated maximum diameter of the ascending aorta 45 mm. Recommend semi-annual imaging followup by CTA or MRA and referral to cardiothoracic surgery if not already obtained. This recommendation follows 2010 ACCF/AHA/AATS/ACR/ASA/SCA/SCAI/SIR/STS/SVM Guidelines for the Diagnosis and Management of Patients With Thoracic Aortic Disease. Circulation. 2010; 121: W237-S283. Aortic aneurysm NOS (ICD10-I71.9)   Lung  nodularity at the bilateral lung apices, poorly characterized by MR. If MR surveillance of the aorta is elected, suggest at least chest x-ray documentation of what is presumably  benign scarring at the lung apices. Otherwise, this could be characterized on surveillance CT angiogram.  TTE 04/08/20: IMPRESSIONS   1. Left ventricular ejection fraction, by estimation, is 50 to 55%. The  left ventricle has low normal function. The left ventricle has no regional  wall motion abnormalities. Left ventricular diastolic parameters are  indeterminate.   2. Right ventricular systolic function is normal. The right ventricular  size is normal. There is normal pulmonary artery systolic pressure.   3. The mitral valve is normal in structure. Mild to moderate mitral valve  regurgitation. No evidence of mitral stenosis.   4. The aortic valve is normal in structure. Aortic valve regurgitation is  mild to moderate. No aortic stenosis is present.   5. Aortic dilatation noted. There is mild to moderate dilatation of the  ascending aorta, measuring 44 mm.  EKG:  EKG is personally reviewed. 05/11/2022: EKG was not ordered. 12/02/2021: SB. Vent rate 56 BPM. PR interval 182 ms. QRS duration 84 ms. QT/QTcB 468/451 ms.     Recent Labs: 04/21/2022: BUN 21; Creatinine, Ser 0.90; Hemoglobin 14.2; Platelets 278; Potassium 4.0; Sodium 140  Recent Lipid Panel    Component Value Date/Time   CHOL 145 05/08/2021 0934   CHOL 122 01/09/2021 0911   TRIG 79.0 05/08/2021 0934   HDL 68.80 05/08/2021 0934   HDL 65 01/09/2021 0911   CHOLHDL 2 05/08/2021 0934   VLDL 15.8 05/08/2021 0934   LDLCALC 61 05/08/2021 0934   LDLCALC 45 01/09/2021 0911   LDLCALC 110 (H) 03/27/2020 0917     Risk Assessment/Calculations:    CHA2DS2-VASc Score = 4  This indicates a 4.8% annual risk of stroke. The patient's score is based upon: CHF History: 0 HTN History: 1 Diabetes History: 0 Stroke History: 0 Vascular Disease History:  1 Age Score: 1 Gender Score: 1     Physical Exam:    VS:  BP 100/60   Pulse 71   Ht '5\' 6"'$  (1.676 m)   Wt 172 lb 9.6 oz (78.3 kg)   LMP 06/07/2000 (Approximate)   SpO2 98%   BMI 27.86 kg/m     Wt Readings from Last 3 Encounters:  05/11/22 172 lb 9.6 oz (78.3 kg)  01/12/22 181 lb 3.2 oz (82.2 kg)  12/23/21 180 lb 9.6 oz (81.9 kg)     GEN:  Comfortable, well appearing HEENT: Normal NECK: No JVD; No carotid bruits CARDIAC: RRR,  2/6 systolic murmur. No rubs or gallops RESPIRATORY:  CTAB, no wheezes ABDOMEN: Soft, non-tender, non-distended MUSCULOSKELETAL:  Warm, no edema  SKIN: Warm and dry NEUROLOGIC:  Alert and oriented x 3 PSYCHIATRIC:  Normal affect   ASSESSMENT:    1. Coronary artery disease involving native coronary artery of native heart without angina pectoris   2. Hyperlipidemia, unspecified hyperlipidemia type   3. Statin intolerance   4. Paroxysmal atrial fibrillation (HCC)   5. Thoracic aortic aneurysm without rupture, unspecified part ([place])   6. Essential hypertension   7. Secondary hypercoagulable state ([place])   8. Dilation of aorta (HCC)      PLAN:    In order of problems listed above:  #CAD: Coronary CTA 06/2021 with >70% mid LAD lesion with negative FFR. Ca score 543 (91%). Currently denies any anginal symptoms. Recommended for continued aggressive medical therapy. -Did not tolerate statin therapy -Now on zetia '10mg'$  and repatha '140mg'$  every 2 weeks -Not on ASA due to need for xarelto   #Paroxysmal Afib: CHADs-vasc 4. Was maintaining sinus rhythm since  starting flecainide, however, was found to have >70%mLAD lesion on CTA and thus the medication was stopped. She now wishes to proceed with ablation with Dr. Curt Bears. On xarelto for [facility] which she is tolerating well. -Plan for ablation with Dr. Curt Bears -Flec stopped due to significant LAD disease on CTA -Continue xarelto for [facility] -Continue metop '25mg'$  XL -Follow-up with Afib clinic as scheduled    #Mild-to-moderate ascending aortic dilation Measures 34m on TTE and 461mon MRA chest 11/12/20. CTA 06/15/21 with stable size 4428m-Followed by Dr. BarCyndia Bentggressive blood pressure control as detailed above -Avoid heavy lifting >30lbs; discussed with the patient -Okay for weight bearing strength exercises like pilates   #Mild-to-moderate MR: Mild on TTE 12/2021. -Continue serial monitoring with TTE 12/2023   #HTN Well controlled at 120/70s. Continues to have mild orthostatic symptoms -Decrease lisinopril-HTCZ '20mg'$ -12.'5mg'$  to 1 pill daily -Continue metop '25mg'$  XL   #HLD: Had myalgias with crestor and lipitor now on PCKS9i. Goal LDL <70 given significant CAD on coronary CTA.  -Continue zetia '10mg'$  daily -Continue repatha '140mg'$  every 2 weeks -Lipids today -Follows with Dr. MacPrentiss BellsFollow up in 6 months.    Medication Adjustments/Labs and Tests Ordered: Current medicines are reviewed at length with the patient today.  Concerns regarding medicines are outlined above.  Orders Placed This Encounter  Procedures   Lipid panel   Lipid panel   Meds ordered this encounter  Medications   lisinopril-hydrochlorothiazide (ZESTORETIC) 20-12.5 MG tablet    Sig: Take 1 tablet by mouth daily.    Dispense:  90 tablet    Refill:  3   Patient Instructions  Medication Instructions:  Your physician recommends that you continue on your current medications as directed. Please refer to the Current Medication list given to you today.  *If you need a refill on your cardiac medications before your next appointment, please call your pharmacy*   Lab Work: BMET 6 months If you have labs (blood work) drawn today and your tests are completely normal, you will receive your results only by: [place] you have MyChart) OR A paper copy in the mail If you have any lab test that is abnormal or we need to change your treatment, we will call you to review the results.    Follow-Up: [facility] and your health needs are our priority.  As part of our continuing mission to provide you with exceptional heart care, we have created designated Provider Care Teams.  These Care Teams include your primary Cardiologist (physician) and Advanced Practice Providers (APPs -  Physician Assistants and Nurse Practitioners) who all work together to provide you with the care you need, when you need it.   Your next appointment:   6 month(s)  The format for your next appointment:   In Person  Provider:   HeaFreada BergeronD      Important Information About Sugar         I,Rachel Rivera,acting as a scribe for HeaFreada BergeronD.,have documented all relevant documentation on the behalf of HeaFreada BergeronD,as directed by  HeaFreada BergeronD while in the presence of HeaFreada BergeronD.  I, HeaFreada BergeronD, have reviewed all documentation for this visit. The documentation on 05/11/22 for the exam, diagnosis, procedures, and orders are all accurate and complete.     Signed, HeaFreada BergeronD  05/11/2022 9:39 AM    [place]

## 2022-05-11 NOTE — Patient Instructions (Signed)
Medication Instructions:  Your physician recommends that you continue on your current medications as directed. Please refer to the Current Medication list given to you today.  *If you need a refill on your cardiac medications before your next appointment, please call your pharmacy*   Lab Work: BMET 6 months If you have labs (blood work) drawn today and your tests are completely normal, you will receive your results only by: [place] (if you have MyChart) OR A paper copy in the mail If you have any lab test that is abnormal or we need to change your treatment, we will call you to review the results.    Follow-Up: At [facility], you and your health needs are our priority.  As part of our continuing mission to provide you with exceptional heart care, we have created designated Provider Care Teams.  These Care Teams include your primary Cardiologist (physician) and Advanced Practice Providers (APPs -  Physician Assistants and Nurse Practitioners) who all work together to provide you with the care you need, when you need it.   Your next appointment:   6 month(s)  The format for your next appointment:   In Person  Provider:   [name], MD      Important Information About Sugar

## 2022-05-11 NOTE — Pre-Procedure Instructions (Signed)
Instructed patient on the following items: Arrival time 0900 Nothing to eat or drink after midnight No meds AM of procedure Responsible person to drive you home and stay with you for 24 hrs  Have you missed any doses of anti-coagulant Xarelto- hasn't missed any doses

## 2022-05-12 ENCOUNTER — Encounter (HOSPITAL_COMMUNITY)
Admission: RE | Disposition: A | Discharge status: Home / Self Care | Payer: Medicare Other | Source: Home / Self Care | Attending: Cardiology

## 2022-05-12 ENCOUNTER — Ambulatory Visit (HOSPITAL_COMMUNITY): Payer: Medicare Other | Admitting: Anesthesiology

## 2022-05-12 ENCOUNTER — Ambulatory Visit (HOSPITAL_BASED_OUTPATIENT_CLINIC_OR_DEPARTMENT_OTHER): Payer: Medicare Other | Admitting: Anesthesiology

## 2022-05-12 ENCOUNTER — Ambulatory Visit (HOSPITAL_COMMUNITY)
Admission: RE | Admit: 2022-05-12 | Discharge: 2022-05-12 | Disposition: A | Discharge status: Home / Self Care | Payer: Medicare Other | Attending: Cardiology | Admitting: Cardiology

## 2022-05-12 DIAGNOSIS — I480 Paroxysmal atrial fibrillation: Secondary | ICD-10-CM | POA: Insufficient documentation

## 2022-05-12 DIAGNOSIS — J45909 Unspecified asthma, uncomplicated: Secondary | ICD-10-CM | POA: Insufficient documentation

## 2022-05-12 DIAGNOSIS — Z87891 Personal history of nicotine dependence: Secondary | ICD-10-CM

## 2022-05-12 DIAGNOSIS — Z7901 Long term (current) use of anticoagulants: Secondary | ICD-10-CM | POA: Insufficient documentation

## 2022-05-12 DIAGNOSIS — G4730 Sleep apnea, unspecified: Secondary | ICD-10-CM | POA: Insufficient documentation

## 2022-05-12 DIAGNOSIS — I4891 Unspecified atrial fibrillation: Secondary | ICD-10-CM

## 2022-05-12 DIAGNOSIS — I10 Essential (primary) hypertension: Secondary | ICD-10-CM | POA: Insufficient documentation

## 2022-05-12 DIAGNOSIS — R0609 Other forms of dyspnea: Secondary | ICD-10-CM | POA: Diagnosis not present

## 2022-05-12 DIAGNOSIS — I2510 Atherosclerotic heart disease of native coronary artery without angina pectoris: Secondary | ICD-10-CM | POA: Diagnosis not present

## 2022-05-12 HISTORY — PX: ATRIAL FIBRILLATION ABLATION: EP1191

## 2022-05-12 LAB — POCT ACTIVATED CLOTTING TIME
Activated Clotting Time: 331 seconds
Activated Clotting Time: 358 seconds

## 2022-05-12 SURGERY — ATRIAL FIBRILLATION ABLATION
Anesthesia: General

## 2022-05-12 MED ORDER — HEPARIN (PORCINE) IN NACL 1000-0.9 UT/500ML-% IV SOLN
INTRAVENOUS | Status: AC
  Filled 2022-05-12: qty 500

## 2022-05-12 MED ORDER — HEPARIN SODIUM (PORCINE) 1000 UNIT/ML IJ SOLN
INTRAMUSCULAR | Status: DC | PRN
  Administered 2022-05-12: 1000 [IU] via INTRAVENOUS

## 2022-05-12 MED ORDER — PROPOFOL 10 MG/ML IV BOLUS
INTRAVENOUS | Status: DC | PRN
  Administered 2022-05-12: 100 mg via INTRAVENOUS

## 2022-05-12 MED ORDER — LIDOCAINE 2% (20 MG/ML) 5 ML SYRINGE
INTRAMUSCULAR | Status: DC | PRN
  Administered 2022-05-12: 80 mg via INTRAVENOUS

## 2022-05-12 MED ORDER — PROTAMINE SULFATE 10 MG/ML IV SOLN: INTRAVENOUS | Status: DC | PRN

## 2022-05-12 MED ORDER — FENTANYL CITRATE (PF) 250 MCG/5ML IJ SOLN
INTRAMUSCULAR | Status: DC | PRN
  Administered 2022-05-12: 50 ug via INTRAVENOUS

## 2022-05-12 MED ORDER — HEPARIN SODIUM (PORCINE) 1000 UNIT/ML IJ SOLN
INTRAMUSCULAR | Status: AC
  Filled 2022-05-12: qty 10

## 2022-05-12 MED ORDER — DOBUTAMINE INFUSION FOR EP/ECHO/NUC (1000 MCG/ML)
INTRAVENOUS | Status: DC | PRN
  Administered 2022-05-12: 20 ug/kg/min via INTRAVENOUS

## 2022-05-12 MED ORDER — PHENYLEPHRINE HCL (PRESSORS) 10 MG/ML IV SOLN
INTRAVENOUS | Status: DC | PRN
  Administered 2022-05-12: 120 ug via INTRAVENOUS
  Administered 2022-05-12: 80 ug via INTRAVENOUS
  Administered 2022-05-12: 120 ug via INTRAVENOUS
  Administered 2022-05-12: 80 ug via INTRAVENOUS

## 2022-05-12 MED ORDER — DOBUTAMINE INFUSION FOR EP/ECHO/NUC (1000 MCG/ML)
INTRAVENOUS | Status: AC
  Filled 2022-05-12: qty 250

## 2022-05-12 MED ORDER — PHENYLEPHRINE HCL-NACL 20-0.9 MG/250ML-% IV SOLN
INTRAVENOUS | Status: DC | PRN
  Administered 2022-05-12: 25 ug/min via INTRAVENOUS

## 2022-05-12 MED ORDER — DEXAMETHASONE SODIUM PHOSPHATE 10 MG/ML IJ SOLN
INTRAMUSCULAR | Status: DC | PRN
  Administered 2022-05-12: 5 mg via INTRAVENOUS

## 2022-05-12 MED ORDER — HEPARIN SODIUM (PORCINE) 1000 UNIT/ML IJ SOLN
INTRAMUSCULAR | Status: DC | PRN
  Administered 2022-05-12: 2000 [IU] via INTRAVENOUS
  Administered 2022-05-12: 14000 [IU] via INTRAVENOUS

## 2022-05-12 MED ORDER — PROTAMINE SULFATE 10 MG/ML IV SOLN
INTRAVENOUS | Status: DC | PRN
  Administered 2022-05-12: 10 mg via INTRAVENOUS
  Administered 2022-05-12: 20 mg via INTRAVENOUS
  Administered 2022-05-12: 10 mg via INTRAVENOUS

## 2022-05-12 MED ORDER — SUGAMMADEX SODIUM 200 MG/2ML IV SOLN
INTRAVENOUS | Status: DC | PRN
  Administered 2022-05-12: 150 mg via INTRAVENOUS

## 2022-05-12 MED ORDER — HEPARIN (PORCINE) IN NACL 1000-0.9 UT/500ML-% IV SOLN
INTRAVENOUS | Status: DC | PRN
  Administered 2022-05-12 (×3): 500 mL

## 2022-05-12 MED ORDER — PHENYLEPHRINE HCL (PRESSORS) 10 MG/ML IV SOLN: INTRAVENOUS | Status: DC | PRN

## 2022-05-12 MED ORDER — PHENYLEPHRINE HCL-NACL 20-0.9 MG/250ML-% IV SOLN: INTRAVENOUS | Status: DC | PRN

## 2022-05-12 MED ORDER — ROCURONIUM BROMIDE 10 MG/ML (PF) SYRINGE
PREFILLED_SYRINGE | INTRAVENOUS | Status: DC | PRN
  Administered 2022-05-12: 60 mg via INTRAVENOUS

## 2022-05-12 MED ORDER — SODIUM CHLORIDE 0.9 % IV SOLN: INTRAVENOUS | Status: DC

## 2022-05-12 MED ORDER — ONDANSETRON HCL 4 MG/2ML IJ SOLN
INTRAMUSCULAR | Status: DC | PRN
  Administered 2022-05-12: 4 mg via INTRAVENOUS

## 2022-05-12 SURGICAL SUPPLY — 19 items
CATH 8FR REPROCESSED SOUNDSTAR (CATHETERS) ×1 IMPLANT
CATH 8FR SOUNDSTAR REPROCESSED (CATHETERS) IMPLANT
CATH ABLAT QDOT MICRO BI TC DF (CATHETERS) IMPLANT
CATH OCTARAY 2.0 F 3-3-3-3-3 (CATHETERS) IMPLANT
CATH PIGTAIL STEERABLE D1 8.7 (WIRE) IMPLANT
CATH S-M CIRCA TEMP PROBE (CATHETERS) IMPLANT
CATH WEB BI DIR CSDF CRV REPRO (CATHETERS) IMPLANT
CLOSURE PERCLOSE PROSTYLE (VASCULAR PRODUCTS) IMPLANT
COVER SWIFTLINK CONNECTOR (BAG) ×1 IMPLANT
PACK EP LATEX FREE (CUSTOM PROCEDURE TRAY) ×1
PACK EP LF (CUSTOM PROCEDURE TRAY) ×1 IMPLANT
PAD DEFIB RADIO PHYSIO CONN (PAD) ×1 IMPLANT
PATCH CARTO3 (PAD) IMPLANT
SHEATH CARTO VIZIGO SM CVD (SHEATH) IMPLANT
SHEATH PINNACLE 7F 10CM (SHEATH) IMPLANT
SHEATH PINNACLE 8F 10CM (SHEATH) IMPLANT
SHEATH PINNACLE 9F 10CM (SHEATH) IMPLANT
SHEATH PROBE COVER 6X72 (BAG) IMPLANT
TUBING SMART ABLATE COOLFLOW (TUBING) IMPLANT

## 2022-05-12 NOTE — Anesthesia Preprocedure Evaluation (Addendum)
Anesthesia Evaluation  Patient identified by MRN, date of birth, ID band Patient awake    Reviewed: Allergy & Precautions, NPO status , Patient's Chart, lab work & pertinent test results  Airway Mallampati: II  TM Distance: >3 FB Neck ROM: Full    Dental no notable dental hx. (+) Dental Advisory Given, Teeth Intact   Pulmonary asthma , sleep apnea , former smoker   Pulmonary exam normal breath sounds clear to auscultation       Cardiovascular hypertension, Pt. on medications + dysrhythmias Atrial Fibrillation + Valvular Problems/Murmurs AI and MR  Rhythm:Regular Rate:Normal  Echo 12/2021  1. Left ventricular ejection fraction, by estimation, is 60 to 65%. The left ventricle has normal function. The left ventricle has no regional wall motion abnormalities. Left ventricular diastolic parameters are consistent with Grade I diastolic dysfunction (impaired relaxation).   2. Right ventricular systolic function is normal. The right ventricular size is normal.   3. The mitral valve is grossly normal. Mild mitral valve regurgitation. No evidence of mitral stenosis.   4. The aortic valve is tricuspid. There is mild calcification of the aortic valve. There is mild thickening of the aortic valve. Aortic valve regurgitation is mild to moderate. Aortic valve sclerosis/calcification is present, without any evidence of aortic stenosis.   5. Aortic dilatation noted. There is mild-to-moderate dilatation of the ascending aorta, measuring 45 mm. There is borderline dilatation of the aortic arch, measuring 39 mm.   6. The inferior vena cava is normal in size with greater than 50% respiratory variability, suggesting right atrial pressure of 3 mmHg.   Comparison(s): Compared to prior TTE on 04/08/2020, the LVEF appears more  vigorous and the MR appears less (prior EF 50-55% with mild-to-moderate MR). There continues to be mild-to-moderate AI.     Neuro/Psych   PSYCHIATRIC DISORDERS Anxiety Depression     Neuromuscular disease    GI/Hepatic ,GERD  Medicated and Controlled,,  Endo/Other    Renal/GU      Musculoskeletal  (+) Arthritis ,    Abdominal   Peds  Hematology   Anesthesia Other Findings   Reproductive/Obstetrics                             Anesthesia Physical Anesthesia Plan  ASA: 3  Anesthesia Plan: General   Post-op Pain Management:    Induction: Intravenous  PONV Risk Score and Plan: 3 and Treatment may vary due to age or medical condition, Ondansetron and Dexamethasone  Airway Management Planned: Oral ETT  Additional Equipment:   Intra-op Plan:   Post-operative Plan: Extubation in OR  Informed Consent: I have reviewed the patients History and Physical, chart, labs and discussed the procedure including the risks, benefits and alternatives for the proposed anesthesia with the patient or authorized representative who has indicated his/her understanding and acceptance.     Dental advisory given  Plan Discussed with: CRNA  Anesthesia Plan Comments:         Anesthesia Quick Evaluation

## 2022-05-12 NOTE — Discharge Instructions (Signed)

## 2022-05-12 NOTE — Transfer of Care (Signed)
Immediate Anesthesia Transfer of Care Note  Patient: Glenda Bauer  Procedure(s) Performed: ATRIAL FIBRILLATION ABLATION  Patient Location: Cath Lab  Anesthesia Type:General  Level of Consciousness: awake, alert , patient cooperative, and responds to stimulation  Airway & Oxygen Therapy: Patient Spontanous Breathing and Patient connected to nasal cannula oxygen  Post-op Assessment: Report given to RN and Post -op Vital signs reviewed and stable  Post vital signs: Reviewed and stable  Last Vitals:  Vitals Value Taken Time  BP    Temp 36.7 C 05/12/22 1315  Pulse 68 05/12/22 1317  Resp 19 05/12/22 1317  SpO2 96 % 05/12/22 1317  Vitals shown include unvalidated device data.  Last Pain:  Vitals:   05/12/22 1315  TempSrc: Temporal  PainSc:          Complications: There were no known notable events for this encounter.

## 2022-05-12 NOTE — Progress Notes (Signed)
Patient and son was given discharge instructions. Both verbalized understanding. 

## 2022-05-12 NOTE — H&P (Signed)
Electrophysiology Office Note   Date:  05/12/2022   ID:  Glenda Bauer, DOB January 26, 1951, MRN [id number]  PCP:  [name], NP  Cardiologist:  Johney Frame Primary Electrophysiologist:  Bodhi Stenglein Meredith Leeds, MD    Chief Complaint: AF   History of Present Illness: Glenda Bauer is a 71 y.o. female who is being seen today for the evaluation of AF at the request of No ref. provider found. Presenting today for electrophysiology evaluation.  She has a history seen for hypertension, coronary artery disease, cutaneous T-cell lymphoma, atrial fibrillation.  She was diagnosed with atrial fibrillation in 2021.  She has been having more frequent palpitations.  She is currently on Xarelto.  Her symptoms are fatigue, dyspnea on exertion.  Coronary CT shows nonobstructive coronary artery disease and thus flecainide was discontinued.  Today, denies symptoms of palpitations, chest pain, shortness of breath, orthopnea, PND, lower extremity edema, claudication, dizziness, presyncope, syncope, bleeding, or neurologic sequela. The patient is tolerating medications without difficulties. Plan ablation today.    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 Hipolito Bayley   Depression    GERD (gastroesophageal reflux disease)    Heart murmur    Hyperlipidemia    Hypertension    Neuromuscular disorder ([place])    Osteoarthritis    Sebaceous cyst 2022   right vulva   Thyroid nodule    Urinary tract infection    Past Surgical History:  Procedure Laterality Date   Cataracts Bilateral    CHOLECYSTECTOMY  2015   melenoma removal  1979   Sinus Polyps        Current Facility-Administered Medications  Medication Dose Route Frequency Provider Last Rate Last Admin   0.9 %  sodium chloride infusion    Intravenous Continuous Constance Haw, MD 50 mL/hr at 05/12/22 1000 New Bag at 05/12/22 1000    Allergies:   Patient has no known allergies.   Social History:  The patient  reports that she quit smoking about 40 years ago. Her smoking use included cigarettes. She has a 2.50 pack-year smoking history. She has been exposed to tobacco smoke. She has never used smokeless tobacco. She reports that she does not drink alcohol and does not use drugs.   Family History:  The patient's family history includes Arthritis in her mother; Breast cancer (age of onset: 67) in her mother; Crohn's disease in her brother; Ovarian cancer in her mother; Parkinson's disease in her father; Schizophrenia in her brother; Thyroid disease in her mother.    ROS:  Please see the history of present illness.   Otherwise, review of systems is positive for none.   All other systems are reviewed and negative.   PHYSICAL EXAM: VS:  LMP 06/07/2000 (Approximate)  , BMI There is no height or weight on file to calculate BMI. GEN: Well nourished, well developed, in no acute distress  HEENT: normal  Neck: no JVD, carotid bruits, or masses Cardiac: RRR; no murmurs, rubs, or gallops,no edema  Respiratory:  clear to auscultation bilaterally, normal work of breathing GI: soft, nontender, nondistended, + BS MS: no deformity or atrophy  Skin: warm and dry Neuro:  Strength and sensation are intact Psych: euthymic mood, full affect  Recent Labs: 04/21/2022: BUN 21;  Creatinine, Ser 0.90; Hemoglobin 14.2; Platelets 278; Potassium 4.0; Sodium 140    Lipid Panel     Component Value Date/Time   CHOL 124 05/11/2022 0923   TRIG 97 05/11/2022 0923   HDL 54 05/11/2022 0923   CHOLHDL 2.3 05/11/2022 0923   CHOLHDL 2 05/08/2021 0934   VLDL 15.8 05/08/2021 0934   LDLCALC 52 05/11/2022 0923   LDLCALC 110 (H) 03/27/2020 0917     Wt Readings from Last 3 Encounters:  05/11/22 78.3 kg  01/12/22 82.2 kg  12/23/21 81.9 kg       Other studies Reviewed: Additional studies/ records that were reviewed today include: TTE 12/09/21  Review of the above records today demonstrates:   1. Left ventricular ejection fraction, by estimation, is 60 to 65%. The  left ventricle has normal function. The left ventricle has no regional  wall motion abnormalities. Left ventricular diastolic parameters are  consistent with Grade I diastolic  dysfunction (impaired relaxation).   2. Right ventricular systolic function is normal. The right ventricular  size is normal.   3. The mitral valve is grossly normal. Mild mitral valve regurgitation.  No evidence of mitral stenosis.   4. The aortic valve is tricuspid. There is mild calcification of the  aortic valve. There is mild thickening of the aortic valve. Aortic valve  regurgitation is mild to moderate. Aortic valve sclerosis/calcification is  present, without any evidence of  aortic stenosis.   5. Aortic dilatation noted. There is mild-to-moderate dilatation of the  ascending aorta, measuring 45 mm. There is borderline dilatation of the  aortic arch, measuring 39 mm.   6. The inferior vena cava is normal in size with greater than 50%  respiratory variability, suggesting right atrial pressure of 3 mmHg.   Coronary CTA 1. Coronary calcium score of 543. This was 91st percentile for age-, race-, and sex-matched controls.   2. Normal coronary origin with right dominance.   3.  Ascending aorta aneurysm (4.4cm.)   4. Severe (>70%) soft plaque in the mid LAD. Minimal (<25%) plaque in LM and proximal LAD. CAD-RADS 4.  1.  FFRct findings are consistent with non-obstructive disease.   2. Recommend aggressive risk factor modification, including LDL goal <70.  ASSESSMENT AND PLAN:  1.  Paroxysmal atrial fibrillation: Glenda Bauer has presented today for surgery, with the diagnosis of AF.  The various methods of treatment have been discussed with the patient and family. After  consideration of risks, benefits and other options for treatment, the patient has consented to  Procedure(s): Catheter ablation as a surgical intervention .  Risks include but not limited to complete heart block, stroke, esophageal damage, nerve damage, bleeding, vascular damage, tamponade, perforation, MI, and death. The patient's history has been reviewed, patient examined, no change in status, stable for surgery.  I have reviewed the patient's chart and labs.  Questions were answered to the patient's satisfaction.    Glenda Wassenaar Curt Bears, MD 05/12/2022 10:44 AM

## 2022-05-13 ENCOUNTER — Encounter (HOSPITAL_COMMUNITY): Payer: Self-pay | Admitting: Cardiology

## 2022-05-13 NOTE — Anesthesia Postprocedure Evaluation (Signed)
Anesthesia Post Note  Patient: Glenda Bauer  Procedure(s) Performed: ATRIAL FIBRILLATION ABLATION     Patient location during evaluation: Cath Lab Anesthesia Type: General Level of consciousness: sedated and patient cooperative Pain management: pain level controlled Vital Signs Assessment: post-procedure vital signs reviewed and stable Respiratory status: spontaneous breathing Cardiovascular status: stable Anesthetic complications: no   There were no known notable events for this encounter.  Last Vitals:  Vitals:   05/12/22 1530 05/12/22 1600  BP: 113/70 116/78  Pulse: 67 69  Resp: 16 17  Temp:    SpO2: 93% 92%    Last Pain:  Vitals:   05/12/22 1345  TempSrc: Temporal  PainSc:                  Nolon Nations

## 2022-05-17 ENCOUNTER — Other Ambulatory Visit: Payer: Medicare Other

## 2022-05-19 DIAGNOSIS — G4733 Obstructive sleep apnea (adult) (pediatric): Secondary | ICD-10-CM | POA: Diagnosis not present

## 2022-05-26 ENCOUNTER — Ambulatory Visit: Payer: Medicare Other | Admitting: Surgery

## 2022-06-03 ENCOUNTER — Other Ambulatory Visit: Payer: Self-pay | Admitting: Adult Health

## 2022-06-03 DIAGNOSIS — F419 Anxiety disorder, unspecified: Secondary | ICD-10-CM

## 2022-06-04 ENCOUNTER — Telehealth: Payer: Self-pay | Admitting: Pharmacist

## 2022-06-04 NOTE — Chronic Care Management (AMB) (Signed)
Chronic Care Management Pharmacy Assistant   Name: Glenda Bauer  MRN: [id number] DOB: [birth year]  Reason for Encounter: Medication Review / Medication Coordination Call   Recent office visits:  None  Recent consult visits:  05/11/2022 Gwyndolyn Kaufman MD (cardiology) - Patient was seen for Coronary artery disease involving native coronary artery of native heart without angina pectoris and additional concerns. Decreased Lisinopril HCTZ 20/12.5 mg to 1 daily. Follow up in 6 months.  Hospital visits:  Patient was seen at [facility] on 05/12/2022 (6 hours) due to atrial fibrillation ablation.   New?Medications Started at [facility] Discharge:?? -No medications started Medication Changes at Hospital Discharge: No medication changes Medications Discontinued at Hospital Discharge: No medications discontinued Medications that remain the same after Hospital Discharge:??  -All other medications will remain the same.    Medications: Outpatient Encounter Medications as of 06/04/2022  Medication Sig Note   albuterol (VENTOLIN HFA) 108 (90 Base) MCG/ACT inhaler INHALE TWO PUFFS BY MOUTH INTO LUNGS EVERY 6 HOURS AS NEEDED FOR SHORTNESS OF BREATH OR FOR WHEEZING    ALPRAZolam (XANAX) 0.25 MG tablet TAKE 1 TO 2 TABLETS BY MOUTH ONCE daily AS NEEDED    budesonide-formoterol (SYMBICORT) 160-4.5 MCG/ACT inhaler Inhale 2 puffs into the lungs 2 (two) times daily.    ezetimibe (ZETIA) 10 MG tablet Take 1 tablet (10 mg total) by mouth daily.    famotidine (PEPCID) 20 MG tablet Take 20 mg by mouth daily as needed for indigestion or heartburn.    FLUoxetine (PROZAC) 20 MG capsule TAKE ONE CAPSULE BY MOUTH ONCE DAILY    fluticasone (FLONASE SENSIMIST) 27.5 MCG/SPRAY nasal spray Place 1 spray into the nose in the morning.    levalbuterol (XOPENEX) 0.63 MG/3ML nebulizer solution Take 3 mLs (0.63 mg total) by nebulization every 6 (six) hours as needed for wheezing or shortness of  breath.    levocetirizine (XYZAL) 5 MG tablet Take 5 mg by mouth every other day. In the evening.    lisinopril-hydrochlorothiazide (ZESTORETIC) 20-12.5 MG tablet Take 1 tablet by mouth daily.    metoprolol succinate (TOPROL-XL) 25 MG 24 hr tablet Take 1 tablet (25 mg total) by mouth daily. Take with or immediately following a meal.    montelukast (SINGULAIR) 10 MG tablet TAKE ONE TABLET BY MOUTH EVERYDAY AT BEDTIME    Multiple Vitamin (MULTIVITAMIN) tablet Take 1 tablet by mouth every evening.    Multiple Vitamins-Minerals (VISION FORMULA PO) Take 1 capsule by mouth every evening.    Omega-3 Fatty Acids (FISH OIL PO) Take 1 capsule by mouth daily. 05/07/2022: On  hold per patient preference   rivaroxaban (XARELTO) 20 MG TABS tablet Take 1 tablet (20 mg total) by mouth every evening.    triamcinolone cream (KENALOG) 0.1 % APPLY TOPICALLY TWICE DAILY (Patient taking differently: Apply 1 Application topically 2 (two) times daily as needed (skin irritation/flares.).)    No facility-administered encounter medications on file as of 06/04/2022.   Reviewed chart for medication changes ahead of medication coordination call.  BP Readings from Last 3 Encounters:  05/12/22 116/78  05/11/22 100/60  01/12/22 110/74    Lab Results  Component Value Date   HGBA1C 5.6 05/08/2021     Patient obtains medications through Adherence Packaging  30 Days    Last adherence delivery included: Ezetimibe (Zetia) 10 mg: Take one tab daily at breakfast Xarelto 20 mg: Take one tab at dinner Montelukast (Singulair) 10 mg :Take one tablet at Bedtime Metropolol Succinate (Toprolol-XL) 25 mg -  Take one tablet at Breakfast Fluoxetine (Prozac) 20 mg : Take one capsule at Breakfast Lisinopril-HCTZ (Zestoric) 20-12.5 mg : Take two tablets at Breakfast Alprazolam (Xanax) .25 mg :Take 1-2 tablets per day as needed      Patient is due for next adherence delivery on: 06/16/2022  Called patient and reviewed medications and  coordinated delivery. Packs 30    This delivery to include: Ezetimibe 10 mg: Take one tab daily at breakfast Xarelto 20 mg: Take one tab at dinner Montelukast 10 mg :Take one tablet at Bedtime Lisinopril-HCTZ 20-12.5 mg : Take one tablet at Breakfast Metropolol Succinate 25 mg - Take one tablet at Breakfast Fluoxetine 20 mg : Take one capsule at Breakfast Alprazolam 0.25 mg :Take 1-2 tablets per day as needed  Unable to reach patient to confirmed medication order and delivery date of 06/16/2022  Care Gaps: AWV - scheduled 10/11/2022 Last BP - 100/60 on 05/11/2022 Tdap - never done Shingrix - never done Flu - overdue Covid - overdue Mammogram - postponed  Star Rating Drugs: Lisinopril- HCTZ 20/12.5 mg - last filled 05/12/2022 30 DS at [facility] Pharmacist Assistant [phone]

## 2022-06-08 NOTE — Telephone Encounter (Signed)
Okay for refill?  

## 2022-06-09 ENCOUNTER — Ambulatory Visit (HOSPITAL_COMMUNITY): Payer: Medicare Other | Admitting: Physician Assistant

## 2022-06-16 ENCOUNTER — Ambulatory Visit
Admission: RE | Admit: 2022-06-16 | Discharge: 2022-06-16 | Disposition: A | Discharge status: Home / Self Care | Payer: Medicare Other | Source: Ambulatory Visit | Attending: Surgery | Admitting: Surgery

## 2022-06-16 DIAGNOSIS — I712 Thoracic aortic aneurysm, without rupture, unspecified: Secondary | ICD-10-CM | POA: Diagnosis not present

## 2022-06-16 DIAGNOSIS — K449 Diaphragmatic hernia without obstruction or gangrene: Secondary | ICD-10-CM | POA: Diagnosis not present

## 2022-06-16 DIAGNOSIS — I7121 Aneurysm of the ascending aorta, without rupture: Secondary | ICD-10-CM

## 2022-06-16 MED ORDER — GADOPICLENOL 0.5 MMOL/ML IV SOLN
8.0000 mL | Freq: Once | INTRAVENOUS | Status: AC | PRN
  Administered 2022-06-16: 8 mL via INTRAVENOUS

## 2022-06-21 ENCOUNTER — Encounter (HOSPITAL_COMMUNITY): Payer: Self-pay | Admitting: Physician Assistant

## 2022-06-21 ENCOUNTER — Ambulatory Visit (HOSPITAL_COMMUNITY)
Admission: RE | Admit: 2022-06-21 | Discharge: 2022-06-21 | Disposition: A | Discharge status: Home / Self Care | Payer: Medicare Other | Source: Ambulatory Visit | Attending: Physician Assistant | Admitting: Physician Assistant

## 2022-06-21 VITALS — BP 138/88 | HR 55 | Ht 66.0 in | Wt 173.8 lb

## 2022-06-21 DIAGNOSIS — I2510 Atherosclerotic heart disease of native coronary artery without angina pectoris: Secondary | ICD-10-CM | POA: Diagnosis not present

## 2022-06-21 DIAGNOSIS — I10 Essential (primary) hypertension: Secondary | ICD-10-CM | POA: Insufficient documentation

## 2022-06-21 DIAGNOSIS — D6869 Other thrombophilia: Secondary | ICD-10-CM | POA: Insufficient documentation

## 2022-06-21 DIAGNOSIS — I480 Paroxysmal atrial fibrillation: Secondary | ICD-10-CM | POA: Insufficient documentation

## 2022-06-21 DIAGNOSIS — C84A0 Cutaneous T-cell lymphoma, unspecified, unspecified site: Secondary | ICD-10-CM | POA: Diagnosis not present

## 2022-06-21 NOTE — Progress Notes (Signed)
Primary Care Physician: [name], NP Primary Cardiologist: Dr Johney Frame  Primary Electrophysiologist: Dr Curt Bears Referring Physician: [name]   Glenda Bauer is a 72 y.o. female with a history of HTN, CAD, cutaneous T-cell lymphoma, and atrial fibrillation who presents for follow up in the [facility].  The patient was initially diagnosed with atrial fibrillation 03/27/20 at her routine PCP visit. She does admit that she had been having more frequent palpitations for a few months prior but these symptoms were brief. ECG showed afib HR 137. Patient was started on Xarelto for a CHADS2VASC score of 4 and metoprolol for rate control. She denies any significant snoring or alcohol use. Patient seen by Dr Johney Frame on 05/25/21 and was having ongoing fatigue and dyspnea with exertion. Cardiac CT ordered which did show nonobstructive CAD, flecainide was discontinued.   On follow up today, patient is s/p afib ablation with Dr Curt Bears on 05/12/22. She reports that she has done well since the procedure with only one brief episode of elevated heart rate. She denies chest pain, swallowing pain, or groin issues.   Today, she denies symptoms of palpitations, chest pain, shortness of breath, orthopnea, PND, lower extremity edema, dizziness, presyncope, syncope, snoring, daytime somnolence, bleeding, or neurologic sequela. The patient is tolerating medications without difficulties and is otherwise without complaint today.    Atrial Fibrillation Risk Factors:  she does not have symptoms or diagnosis of sleep apnea. she does not have a history of rheumatic fever. she does not have a history of alcohol use. The patient does not have a history of early familial atrial fibrillation or other arrhythmias.  she has a BMI of Body mass index is 28.05 kg/m.Marland Kitchen Filed Weights   06/21/22 1412  Weight: 78.8 kg    Family History  Problem Relation Age of Onset   Arthritis  Mother    Ovarian cancer Mother        Metastatic   Breast cancer Mother 63   Thyroid disease Mother    Parkinson's disease Father    Crohn's disease Brother    Schizophrenia Brother    Colon cancer Neg Hx    Esophageal cancer Neg Hx    Rectal cancer Neg Hx    Stomach cancer Neg Hx      Atrial Fibrillation Management history:  Previous antiarrhythmic drugs: flecainide  Previous cardioversions: none Previous ablations: 05/12/22 CHADS2VASC score: 4 Anticoagulation history: Xarelto    Past Medical History:  Diagnosis Date   Allergy    Anxiety    Asthma    Atrial fibrillation ([place])    Cancer ([place])    Melanoma and Mycosis Fungosis   Cataract    Cutaneous T-cell lymphoma ([place]) 2011   Dermatologist treated in [place]; Dr. Hipolito Bayley   Depression    GERD (gastroesophageal reflux disease)    Heart murmur    Hyperlipidemia    Hypertension    Neuromuscular disorder ([place])    Osteoarthritis    Sebaceous cyst 2022   right vulva   Thyroid nodule    Urinary tract infection    Past Surgical History:  Procedure Laterality Date   ATRIAL FIBRILLATION ABLATION N/A 05/12/2022   Procedure: ATRIAL FIBRILLATION ABLATION;  Surgeon: Constance Haw, MD;  Location: [facility];  Service: Cardiovascular;  Laterality: N/A;   Cataracts Bilateral    CHOLECYSTECTOMY  2015   melenoma removal  1979   Sinus Polyps       Current Outpatient Medications  Medication  Sig Dispense Refill   albuterol (VENTOLIN HFA) 108 (90 Base) MCG/ACT inhaler INHALE TWO PUFFS BY MOUTH INTO LUNGS EVERY 6 HOURS AS NEEDED FOR SHORTNESS OF BREATH OR FOR WHEEZING 8.5 g 1   ALPRAZolam (XANAX) 0.25 MG tablet TAKE 1 TO 2 TABLETS BY MOUTH ONCE daily AS NEEDED 30 tablet 2   budesonide-formoterol (SYMBICORT) 160-4.5 MCG/ACT inhaler Inhale 2 puffs into the lungs 2 (two) times daily. 1 each 11   Evolocumab (REPATHA SURECLICK) 657 MG/ML SOAJ Inject into the skin.     ezetimibe (ZETIA) 10 MG tablet Take 1 tablet  (10 mg total) by mouth daily. 90 tablet 3   famotidine (PEPCID) 20 MG tablet Take 20 mg by mouth daily as needed for indigestion or heartburn.     FLUoxetine (PROZAC) 20 MG capsule TAKE ONE CAPSULE BY MOUTH ONCE DAILY 90 capsule 1   fluticasone (FLONASE SENSIMIST) 27.5 MCG/SPRAY nasal spray Place 1 spray into the nose in the morning.     levalbuterol (XOPENEX) 0.63 MG/3ML nebulizer solution Take 3 mLs (0.63 mg total) by nebulization every 6 (six) hours as needed for wheezing or shortness of breath. 225 mL 6   levocetirizine (XYZAL) 5 MG tablet Take 5 mg by mouth every other day. In the evening.     lisinopril-hydrochlorothiazide (ZESTORETIC) 20-12.5 MG tablet Take 1 tablet by mouth daily. 90 tablet 3   metoprolol succinate (TOPROL-XL) 25 MG 24 hr tablet Take 1 tablet (25 mg total) by mouth daily. Take with or immediately following a meal. 90 tablet 1   montelukast (SINGULAIR) 10 MG tablet TAKE ONE TABLET BY MOUTH EVERYDAY AT BEDTIME 90 tablet 3   Multiple Vitamin (MULTIVITAMIN) tablet Take 1 tablet by mouth every evening.     Multiple Vitamins-Minerals (VISION FORMULA PO) Take 1 capsule by mouth every evening.     Omega-3 Fatty Acids (FISH OIL PO) Take 1 capsule by mouth daily.     rivaroxaban (XARELTO) 20 MG TABS tablet Take 1 tablet (20 mg total) by mouth every evening. 28 tablet 0   triamcinolone cream (KENALOG) 0.1 % APPLY TOPICALLY TWICE DAILY (Patient taking differently: Apply 1 Application topically 2 (two) times daily as needed (skin irritation/flares.).) 454 g 0   No current facility-administered medications for this encounter.    No Known Allergies  Social History   Socioeconomic History   Marital status: Divorced    Spouse name: Not on file   Number of children: 3   Years of education: 16   Highest education level: Associate degree: academic program  Occupational History   Occupation: Medical sales representative   Tobacco Use   Smoking status: Former    Packs/day: 0.25    Years: 10.00     Total pack years: 2.50    Types: Cigarettes    Quit date: 06/07/1981    Years since quitting: 41.0    Passive exposure: Past   Smokeless tobacco: Never   Tobacco comments:    Former smoker 12/02/21  Vaping Use   Vaping Use: Never used  Substance and Sexual Activity   Alcohol use: No    Alcohol/week: 0.0 standard drinks of alcohol   Drug use: No   Sexual activity: Not Currently    Birth control/protection: Post-menopausal  Other Topics Concern   Not on file  Social History Narrative   Separated    Three children ( one lives locally)    Social Determinants of Health   Financial Resource Strain: Medium Risk (01/28/2022)   Overall Financial Resource Strain ([place])  Difficulty of Paying Living Expenses: Somewhat hard  Food Insecurity: No Food Insecurity (10/08/2021)   Hunger Vital Sign    Worried About Running Out of Food in the Last Year: Never true    Ran Out of Food in the Last Year: Never true  Transportation Needs: No Transportation Needs (10/08/2021)   PRAPARE - Hydrologist (Medical): No    Lack of Transportation (Non-Medical): No  Physical Activity: Insufficiently Active (10/08/2021)   Exercise Vital Sign    Days of Exercise per Week: 3 days    Minutes of Exercise per Session: 20 min  Stress: Stress Concern Present (10/08/2021)   [place]    Feeling of Stress : To some extent  Social Connections: Socially Isolated (10/08/2021)   Social Connection and Isolation Panel [NHANES]    Frequency of Communication with Friends and Family: More than three times a week    Frequency of Social Gatherings with Friends and Family: More than three times a week    Attends Religious Services: Never    Marine scientist or Organizations: No    Attends Archivist Meetings: Never    Marital Status: Divorced  Human resources officer Violence: Not At Risk (10/08/2021)   Humiliation, Afraid, Rape,  and Kick questionnaire    Fear of Current or Ex-Partner: No    Emotionally Abused: No    Physically Abused: No    Sexually Abused: No     ROS- All systems are reviewed and negative except as per the HPI above.  Physical Exam: Vitals:   06/21/22 1412  BP: 138/88  Pulse: (!) 55  Weight: 78.8 kg  Height: '5\' 6"'$  (1.676 m)     GEN- The patient is a well appearing female, alert and oriented x 3 today.   HEENT-head normocephalic, atraumatic, sclera clear, conjunctiva pink, hearing intact, trachea midline. Lungs- Clear to ausculation bilaterally, normal work of breathing Heart- Regular rate and rhythm, no rubs or gallops, 1/6 systolic murmur  GI- soft, NT, ND, + BS Extremities- no clubbing, cyanosis, or edema MS- no significant deformity or atrophy Skin- no rash or lesion Psych- euthymic mood, full affect Neuro- strength and sensation are intact   Wt Readings from Last 3 Encounters:  06/21/22 78.8 kg  05/12/22 78 kg  05/11/22 78.3 kg    EKG today demonstrates  SB Vent. rate 55 BPM PR interval 162 ms QRS duration 82 ms QT/QTcB 494/472 ms   Echo 12/09/21 demonstrated  1. Left ventricular ejection fraction, by estimation, is 60 to 65%. The  left ventricle has normal function. The left ventricle has no regional  wall motion abnormalities. Left ventricular diastolic parameters are  consistent with Grade I diastolic dysfunction (impaired relaxation).   2. Right ventricular systolic function is normal. The right ventricular  size is normal.   3. The mitral valve is grossly normal. Mild mitral valve regurgitation.  No evidence of mitral stenosis.   4. The aortic valve is tricuspid. There is mild calcification of the  aortic valve. There is mild thickening of the aortic valve. Aortic valve  regurgitation is mild to moderate. Aortic valve sclerosis/calcification is  present, without any evidence of  aortic stenosis.   5. Aortic dilatation noted. There is mild-to-moderate  dilatation of the  ascending aorta, measuring 45 mm. There is borderline dilatation of the  aortic arch, measuring 39 mm.   6. The inferior vena cava is normal in size with  greater than 50%  respiratory variability, suggesting right atrial pressure of 3 mmHg.   Comparison(s): Compared to prior TTE on 04/08/2020, the LVEF appears more vigorous and the MR appears less (prior EF 50-55% with mild-to-moderate MR). There continues to be mild-to-moderate AI.   Epic records are reviewed at length today  CHA2DS2-VASc Score = 4  The patient's score is based upon: CHF History: 0 HTN History: 1 Diabetes History: 0 Stroke History: 0 Vascular Disease History: 1 Age Score: 1 Gender Score: 1      ASSESSMENT AND PLAN: 1. Paroxysmal Atrial Fibrillation (ICD10:  I48.0) The patient's CHA2DS2-VASc score is 4, indicating a 4.8% annual risk of stroke.   S/p afib ablation 05/12/22 Patient appears to be maintaining SR. Continue Toprol 25 mg daily Continue Xarelto 20 mg daily with no missed doses for 3 months post ablation.   2. Secondary Hypercoagulable State (ICD10:  D68.69) The patient is at significant risk for stroke/thromboembolism based upon her CHA2DS2-VASc Score of 4.  Continue Rivaroxaban (Xarelto).   3. HTN Stable, no changes today.  4. CAD CAC score 52 91st percentile with severe plaque in LAD (FFR negative) Did not tolerate statin, on Repatha No anginal symptoms.   Follow up with Dr Curt Bears as scheduled.    [facility] [address] [place], [place] [address] [phone] 06/21/2022 2:38 PM

## 2022-06-23 ENCOUNTER — Ambulatory Visit: Payer: Medicare Other | Admitting: Surgery

## 2022-06-23 ENCOUNTER — Encounter: Payer: Self-pay | Admitting: Surgery

## 2022-06-23 VITALS — BP 122/75 | HR 56 | Resp 20 | Ht 66.0 in | Wt 173.0 lb

## 2022-06-23 DIAGNOSIS — I7121 Aneurysm of the ascending aorta, without rupture: Secondary | ICD-10-CM | POA: Diagnosis not present

## 2022-06-23 NOTE — Progress Notes (Signed)
HPI:  The patient is a 72 year old woman who returns for follow-up of a 4.5 cm fusiform ascending aortic aneurysm.  She has a history of atrial fibrillation, hypertension, hyperlipidemia, previous smoking, asthma, anxiety and depression.  She is followed by Dr. Johney Frame for her cardiology care.  She has been feeling well without chest pain or shortness of breath.  Her most recent echocardiogram in July 2023 showed a normal left ventricular ejection fraction of 60 to 65%.  Aortic valve is trileaflet with mild calcification and thickening with mild to moderate aortic insufficiency.  Current Outpatient Medications  Medication Sig Dispense Refill   albuterol (VENTOLIN HFA) 108 (90 Base) MCG/ACT inhaler INHALE TWO PUFFS BY MOUTH INTO LUNGS EVERY 6 HOURS AS NEEDED FOR SHORTNESS OF BREATH OR FOR WHEEZING 8.5 g 1   ALPRAZolam (XANAX) 0.25 MG tablet TAKE 1 TO 2 TABLETS BY MOUTH ONCE daily AS NEEDED 30 tablet 2   budesonide-formoterol (SYMBICORT) 160-4.5 MCG/ACT inhaler Inhale 2 puffs into the lungs 2 (two) times daily. 1 each 11   Evolocumab (REPATHA SURECLICK) 937 MG/ML SOAJ Inject into the skin.     ezetimibe (ZETIA) 10 MG tablet Take 1 tablet (10 mg total) by mouth daily. 90 tablet 3   famotidine (PEPCID) 20 MG tablet Take 20 mg by mouth daily as needed for indigestion or heartburn.     FLUoxetine (PROZAC) 20 MG capsule TAKE ONE CAPSULE BY MOUTH ONCE DAILY 90 capsule 1   fluticasone (FLONASE SENSIMIST) 27.5 MCG/SPRAY nasal spray Place 1 spray into the nose in the morning.     levalbuterol (XOPENEX) 0.63 MG/3ML nebulizer solution Take 3 mLs (0.63 mg total) by nebulization every 6 (six) hours as needed for wheezing or shortness of breath. 225 mL 6   levocetirizine (XYZAL) 5 MG tablet Take 5 mg by mouth every other day. In the evening.     lisinopril-hydrochlorothiazide (ZESTORETIC) 20-12.5 MG tablet Take 1 tablet by mouth daily. 90 tablet 3   metoprolol succinate (TOPROL-XL) 25 MG 24 hr tablet Take 1  tablet (25 mg total) by mouth daily. Take with or immediately following a meal. 90 tablet 1   montelukast (SINGULAIR) 10 MG tablet TAKE ONE TABLET BY MOUTH EVERYDAY AT BEDTIME 90 tablet 3   Multiple Vitamin (MULTIVITAMIN) tablet Take 1 tablet by mouth every evening.     Multiple Vitamins-Minerals (VISION FORMULA PO) Take 1 capsule by mouth every evening.     Omega-3 Fatty Acids (FISH OIL PO) Take 1 capsule by mouth daily.     rivaroxaban (XARELTO) 20 MG TABS tablet Take 1 tablet (20 mg total) by mouth every evening. 28 tablet 0   triamcinolone cream (KENALOG) 0.1 % APPLY TOPICALLY TWICE DAILY (Patient taking differently: Apply 1 Application topically 2 (two) times daily as needed (skin irritation/flares.).) 454 g 0   No current facility-administered medications for this visit.     Physical Exam: BP 122/75 (BP Location: Left Arm, Patient Position: Sitting)   Pulse (!) 56   Resp 20   Ht '5\' 6"'$  (1.676 m)   Wt 173 lb (78.5 kg)   LMP 06/07/2000 (Approximate)   SpO2 96% Comment: RA  BMI 27.92 kg/m  She looks well. Cardiac exam shows a regular rate and rhythm with normal heart sounds.  There is no murmur. Lungs are clear. There is no peripheral edema.  Diagnostic Tests:  Narrative & Impression  CLINICAL DATA:  Thoracic aortic aneurysm follow-up.   EXAM: MRA CHEST WITH OR WITHOUT CONTRAST   TECHNIQUE:  Angiographic images of the chest were obtained using MRA technique without and with intravenous contrast.   CONTRAST:  8 mL [place]way   COMPARISON:  Prior MRA chest 05/13/2021   FINDINGS: VASCULAR   Aorta: Stable mild fusiform aneurysmal dilation the tubular portion of the ascending thoracic aorta with a maximal diameter of 4.4-4.5 cm. The aortic root remains normal in caliber as does the transverse and descending thoracic aorta. Conventional 3 vessel arch anatomy.   Heart: Normal in size.  No pericardial effusion.   Pulmonary Arteries: Normal in size. No evidence of  pulmonary embolus.   Other: None   NON-VASCULAR   The lungs are clear. No signal abnormality or abnormal enhancement. Moderately large sliding hiatal hernia. No focal signal abnormality or abnormal enhancement in the visualized upper abdomen or musculoskeletal tissues.   IMPRESSION: Continued stability of mild fusiform aneurysmal dilation of the ascending thoracic aorta with a maximal diameter of 4.4-4.5 cm. Recommend annual imaging followup by CTA or MRA. This recommendation follows 2010 ACCF/AHA/AATS/ACR/ASA/SCA/SCAI/SIR/STS/SVM Guidelines for the Diagnosis and Management of Patients with Thoracic Aortic Disease. Circulation. 2010; 121: W808-U110. Aortic aneurysm NOS (ICD10-I71.9)   Signed,   [name], MD, [place]   [facility]   [facility]     Electronically Signed   By: Jacqulynn Cadet M.D.   On: 06/16/2022 12:23      Impression:  She has a stable 4.5 cm fusiform ascending aortic aneurysm with a trileaflet aortic valve with mild to moderate aortic insufficiency that has been stable.  Left ventricular ejection fraction is normal with a normal diastolic LV diameter.  Her aneurysm is well below the surgical threshold of 5.5 cm.  I reviewed the echo and MRA images with her and answered her questions.  I stressed the importance of continued good blood pressure control in preventing further enlargement and acute aortic dissection.  I advised her against doing any heavy lifting that may require a Valsalva maneuver and could suddenly raise her blood pressure to high levels.  Plan:  She will continue to follow-up with her PCP and Dr. Johney Frame.  She should have a yearly echocardiogram.  I will plan to see her back in 1 year with a MRA of the chest.  I spent 20 minutes performing this established patient evaluation and > 50% of this time was spent face to face counseling and coordinating the care of this patient's aortic  aneurysm.    Gaye Pollack, MD [facility] [phone]

## 2022-06-30 ENCOUNTER — Telehealth: Payer: Self-pay

## 2022-06-30 NOTE — Telephone Encounter (Signed)
Pharmacy Patient Advocate Encounter  Prior Authorization for REPATHA 140 MG/ML INJ has been approved.    PA# [id number] Effective dates: 06/30/22 through 06/07/23   Received notification from [place] that prior authorization for REPATHA 140 MG/ML INJ is needed.    PA submitted on 06/30/22 Key [id number] Status is pending  Karie Soda, [place] Patient Advocate Specialist Direct Number: [phone] Fax: [phone]

## 2022-07-02 ENCOUNTER — Telehealth: Payer: Self-pay | Admitting: Pharmacist

## 2022-07-02 NOTE — Progress Notes (Unsigned)
Patient ID: Glenda Bauer, female   DOB: Sep 29, 1950, 72 y.o.   MRN: [id number]  Care Management & Coordination Services Pharmacy Team  Reason for Encounter: Medication coordination and delivery  Contacted patient to discuss medications and coordinate delivery from [facility]. {US HC Outreach:28874} Cycle dispensing form sent to *** for review.   Last adherence delivery date:06/16/22      Patient is due for next adherence delivery on: 07/15/22  This delivery to include: Adherence Packaging  30 Days  Ezetimibe 10 mg: Take one tab daily at breakfast Xarelto 20 mg: Take one tab at dinner Montelukast 10 mg :Take one tablet at Bedtime Lisinopril-HCTZ 20-12.5 mg : Take one tablet at Breakfast Metropolol Succinate 25 mg - Take one tablet at Breakfast Fluoxetine 20 mg : Take one capsule at Breakfast Alprazolam 0.25 mg :Take 1-2 tablets per day as needed   Patient declined the following medications this month: ***  {refills needed:25320}  {Delivery OQHU:76546}   Any concerns about your medications? {yes/no:20286}  How often do you forget or accidentally miss a dose? {Missed doses:25554}  Do you use a pillbox? {yes/no:20286}  Is patient in packaging {yes/no:20286}  If yes  What is the date on your next pill pack?  Any concerns or issues with your packaging?   Recent blood pressure readings are as follows: BP Readings from Last 3 Encounters:  06/23/22 122/75  06/21/22 138/88  05/12/22 116/78     Chart review: Recent office visits:  None  Recent consult visits:  06/23/22 Gaye Pollack, MD (Cardiology) - Patient presented for Aneurysm of ascending aorta without rupture. No medication changes.   06/21/22 Fenton, Doloris Hall, PA (Cardiology) - Patient presented for Paroxysmal atrial fibrillation and other concerns. No medication changes.   Hospital visits:  Patient was seen at [facility] on 05/12/2022 (6 hours) due to atrial fibrillation ablation.    New?Medications Started at [facility] Discharge:?? -No medications started Medication Changes at Hospital Discharge: No medication changes Medications Discontinued at Hospital Discharge: No medications discontinued Medications that remain the same after Hospital Discharge:??  -All other medications will remain the same.    Medications: Outpatient Encounter Medications as of 07/02/2022  Medication Sig Note   albuterol (VENTOLIN HFA) 108 (90 Base) MCG/ACT inhaler INHALE TWO PUFFS BY MOUTH INTO LUNGS EVERY 6 HOURS AS NEEDED FOR SHORTNESS OF BREATH OR FOR WHEEZING    ALPRAZolam (XANAX) 0.25 MG tablet TAKE 1 TO 2 TABLETS BY MOUTH ONCE daily AS NEEDED    budesonide-formoterol (SYMBICORT) 160-4.5 MCG/ACT inhaler Inhale 2 puffs into the lungs 2 (two) times daily.    Evolocumab (REPATHA SURECLICK) 503 MG/ML SOAJ Inject into the skin.    ezetimibe (ZETIA) 10 MG tablet Take 1 tablet (10 mg total) by mouth daily.    famotidine (PEPCID) 20 MG tablet Take 20 mg by mouth daily as needed for indigestion or heartburn.    FLUoxetine (PROZAC) 20 MG capsule TAKE ONE CAPSULE BY MOUTH ONCE DAILY    fluticasone (FLONASE SENSIMIST) 27.5 MCG/SPRAY nasal spray Place 1 spray into the nose in the morning.    levalbuterol (XOPENEX) 0.63 MG/3ML nebulizer solution Take 3 mLs (0.63 mg total) by nebulization every 6 (six) hours as needed for wheezing or shortness of breath.    levocetirizine (XYZAL) 5 MG tablet Take 5 mg by mouth every other day. In the evening.    lisinopril-hydrochlorothiazide (ZESTORETIC) 20-12.5 MG tablet Take 1 tablet by mouth daily.    metoprolol succinate (TOPROL-XL) 25 MG 24 hr  tablet Take 1 tablet (25 mg total) by mouth daily. Take with or immediately following a meal.    montelukast (SINGULAIR) 10 MG tablet TAKE ONE TABLET BY MOUTH EVERYDAY AT BEDTIME    Multiple Vitamin (MULTIVITAMIN) tablet Take 1 tablet by mouth every evening.    Multiple Vitamins-Minerals (VISION FORMULA PO) Take 1 capsule by  mouth every evening.    Omega-3 Fatty Acids (FISH OIL PO) Take 1 capsule by mouth daily. 05/07/2022: On  hold per patient preference   rivaroxaban (XARELTO) 20 MG TABS tablet Take 1 tablet (20 mg total) by mouth every evening.    triamcinolone cream (KENALOG) 0.1 % APPLY TOPICALLY TWICE DAILY (Patient taking differently: Apply 1 Application topically 2 (two) times daily as needed (skin irritation/flares.).)    No facility-administered encounter medications on file as of 07/02/2022.   BP Readings from Last 3 Encounters:  06/23/22 122/75  06/21/22 138/88  05/12/22 116/78    Pulse Readings from Last 3 Encounters:  06/23/22 (!) 56  06/21/22 (!) 55  05/12/22 69    Lab Results  Component Value Date/Time   HGBA1C 5.6 05/08/2021 09:34 AM   Lab Results  Component Value Date   CREATININE 0.90 04/21/2022   BUN 21 04/21/2022   GFR 62.30 05/08/2021   GFRNONAA 59 (L) 05/21/2020   GFRAA 68 05/21/2020   NA 140 04/21/2022   K 4.0 04/21/2022   CALCIUM 9.8 04/21/2022   CO2 25 04/21/2022   Care Gaps TDAP - Overdue Zoster Vaccine - Overdue Flu Vaccine - Overdue COVID Booster - Overdue Mammogram- Postponed AWV- 10/08/21  Star Rated Medications Lisinopril- HCTZ 20/12.5 mg - last filled 06/11/22 30 DS at [facility] Pharmacist Assistant [phone]

## 2022-07-08 NOTE — Progress Notes (Addendum)
Care Management & Coordination Services Pharmacy Note  07/08/2022 Name:  Glenda Bauer MRN:  [id number] DOB:  [birth year]  Summary: -Pt has no new health concerns, reports home BP/HR routinely checked and WNL -Cost concern with Symbicort, PAP program discontinued. Checking copay at pharmacies, may need to consider switchback to [facility] and pursuance of their PAP if unaffordable. Will discuss with PCP if needed to switch.  Recommendations/Changes made from today's visit: -Continue medication therapy -Continue low-sodium diet for mgmt of HTN and routine home BP/HR montoring -Continue daily exercise  Follow up plan: Confirm symbicort copay for this year with pharmacy; pursue PAP for Breo with PCP approval if symbicort is unaffordable F/u visit with pharmacist in 6 months   Subjective: Glenda Bauer is an 72 y.o. year old female who is a primary patient of Glenda Peng, NP.  The care coordination team was consulted for assistance with disease management and care coordination needs.    Engaged with patient by telephone for follow up visit.  Recent office visits: None  Recent consult visits: 06/23/22: Cardiology, Glenda Pollack, Bauer: No medication changes. AAA deemed stable and not requiring surgery, recommended yearly ECHO 06/21/22: Cardiology, Glenda R Fenton, PA-C: No medication changes. F/U from ablation, patient doing well. 05/11/2022 Glenda Bauer (cardiology) - Patient was seen for Coronary artery disease involving native coronary artery of native heart without angina pectoris and additional concerns. Decreased Lisinopril HCTZ 20/12.5 mg to 1 daily. Follow up in 6 months.   Hospital visits: 05/12/22: For scheduled ablation 2/2 a-fib. No medication changes, patient discharged same day.   Objective:  Lab Results  Component Value Date   CREATININE 0.90 04/21/2022   BUN 21 04/21/2022   GFR 62.30 05/08/2021   EGFR 68 04/21/2022   GFRNONAA 59 (L) 05/21/2020   GFRAA  68 05/21/2020   NA 140 04/21/2022   K 4.0 04/21/2022   CALCIUM 9.8 04/21/2022   CO2 25 04/21/2022   GLUCOSE 102 (H) 04/21/2022    Lab Results  Component Value Date/Time   HGBA1C 5.6 05/08/2021 09:34 AM   GFR 62.30 05/08/2021 09:34 AM   GFR 66.11 02/06/2019 07:48 AM    Last diabetic Eye exam: No results found for: "HMDIABEYEEXA"  Last diabetic Foot exam: No results found for: "HMDIABFOOTEX"   Lab Results  Component Value Date   CHOL 124 05/11/2022   HDL 54 05/11/2022   LDLCALC 52 05/11/2022   TRIG 97 05/11/2022   CHOLHDL 2.3 05/11/2022       Latest Ref Rng & Units 05/08/2021    9:34 AM 03/27/2020    9:17 AM 02/06/2019    7:48 AM  Hepatic Function  Total Protein 6.0 - 8.3 g/dL 7.1  6.6  6.3   Albumin 3.5 - 5.2 g/dL 4.2   4.0   AST 0 - 37 U/L '21  18  23   '$ ALT 0 - 35 U/L 29  20  36   Alk Phosphatase 39 - 117 U/L 50   54   Total Bilirubin 0.2 - 1.2 mg/dL 0.8  0.7  0.8     Lab Results  Component Value Date/Time   TSH 0.86 05/08/2021 09:34 AM   TSH 1.26 03/27/2020 09:17 AM       Latest Ref Rng & Units 04/21/2022   11:13 AM 05/08/2021    9:34 AM 03/27/2020    9:17 AM  CBC  WBC 3.4 - 10.8 x10E3/uL 4.4  5.1  5.3   Hemoglobin 11.1 - 15.9 g/dL 14.2  14.4  16.7   Hematocrit 34.0 - 46.6 % 40.4  42.4  48.3   Platelets 150 - 450 x10E3/uL 278  229.0  243     No results found for: "VD25OH", "VITAMINB12"  Clinical ASCVD: No  The ASCVD Risk score (Arnett DK, et al., 2019) failed to calculate for the following reasons:   The valid total cholesterol range is 130 to 320 mg/dL    CHADS2VASc: 3      10/08/2021   10:03 AM 05/08/2021    9:04 AM 09/29/2020    9:22 AM  Depression screen PHQ 2/9  Decreased Interest 0 1 0  Down, Depressed, Hopeless 0 1 0  PHQ - 2 Score 0 2 0  Altered sleeping  1   Tired, decreased energy  2   Change in appetite  0   Feeling bad or failure about yourself   0   Trouble concentrating  0   Moving slowly or fidgety/restless  0   Suicidal thoughts   0   PHQ-9 Score  5      Social History   Tobacco Use  Smoking Status Former   Packs/day: 0.25   Years: 10.00   Total pack years: 2.50   Types: Cigarettes   Quit date: 06/07/1981   Years since quitting: 41.1   Passive exposure: Past  Smokeless Tobacco Never  Tobacco Comments   Former smoker 12/02/21   BP Readings from Last 3 Encounters:  06/23/22 122/75  06/21/22 138/88  05/12/22 116/78   Pulse Readings from Last 3 Encounters:  06/23/22 (!) 56  06/21/22 (!) 55  05/12/22 69   Wt Readings from Last 3 Encounters:  06/23/22 173 lb (78.5 kg)  06/21/22 173 lb 12.8 oz (78.8 kg)  05/12/22 172 lb (78 kg)   BMI Readings from Last 3 Encounters:  06/23/22 27.92 kg/m  06/21/22 28.05 kg/m  05/12/22 27.76 kg/m    No Known Allergies  Medications Reviewed Today     Reviewed by Glenda Pollack, Bauer (Physician) on 06/23/22 at 1138  Med List Status: <None>   Medication Order Taking? Sig Documenting Provider Last Dose Status Informant  albuterol (VENTOLIN HFA) 108 (90 Base) MCG/ACT inhaler [id number] Yes INHALE TWO PUFFS BY MOUTH INTO LUNGS EVERY 6 HOURS AS NEEDED FOR SHORTNESS OF BREATH OR FOR WHEEZING Nafziger, Tommi Rumps, NP Taking Active Self  ALPRAZolam (XANAX) 0.25 MG tablet [id number] Yes TAKE 1 TO 2 TABLETS BY MOUTH ONCE daily AS NEEDED Nafziger, Tommi Rumps, NP Taking Active   budesonide-formoterol (SYMBICORT) 160-4.5 MCG/ACT inhaler [id number] Yes Inhale 2 puffs into the lungs 2 (two) times daily. Glenda Peng, NP Taking Active Self  Evolocumab (Natalia) 283 MG/ML SOAJ [id number] Yes Inject into the skin. Provider, Historical, Bauer Taking Active   ezetimibe (ZETIA) 10 MG tablet [id number] Yes Take 1 tablet (10 mg total) by mouth daily. Glenda Bergeron, Bauer Taking Active Self  famotidine (PEPCID) 20 MG tablet [id number] Yes Take 20 mg by mouth daily as needed for indigestion or heartburn. Provider, Historical, Bauer Taking Active Self  FLUoxetine (PROZAC) 20 MG capsule [id number] Yes  TAKE ONE CAPSULE BY MOUTH ONCE DAILY Nafziger, Tommi Rumps, NP Taking Active   fluticasone (FLONASE SENSIMIST) 27.5 MCG/SPRAY nasal spray [id number] Yes Place 1 spray into the nose in the morning. Provider, Historical, Bauer Taking Active Self  levalbuterol Penne Lash) 0.63 MG/3ML nebulizer solution [id number] Yes Take 3 mLs (0.63 mg total) by nebulization every 6 (six) hours as needed for wheezing or shortness of breath. Glenda Peng, NP  Taking Active Self  levocetirizine (XYZAL) 5 MG tablet [id number] Yes Take 5 mg by mouth every other day. In the evening. Provider, Historical, Bauer Taking Active Self  lisinopril-hydrochlorothiazide (ZESTORETIC) 20-12.5 MG tablet [id number] Yes Take 1 tablet by mouth daily. Glenda Bergeron, Bauer Taking Active   metoprolol succinate (TOPROL-XL) 25 MG 24 hr tablet [id number] Yes Take 1 tablet (25 mg total) by mouth daily. Take with or immediately following a meal. Glenda Bergeron, Bauer Taking Active Self  montelukast (SINGULAIR) 10 MG tablet [id number] Yes TAKE ONE TABLET BY MOUTH EVERYDAY AT BEDTIME Nafziger, Tommi Rumps, NP Taking Active Self  Multiple Vitamin (MULTIVITAMIN) tablet [id number] Yes Take 1 tablet by mouth every evening. Provider, Historical, Bauer Taking Active Self  Multiple Vitamins-Minerals (VISION FORMULA PO) [id number] Yes Take 1 capsule by mouth every evening. Provider, Historical, Bauer Taking Active Self  Omega-3 Fatty Acids (FISH OIL PO) [id number] Yes Take 1 capsule by mouth daily. Provider, Historical, Bauer Taking Active Self           Med Note Kenton Kingfisher, Earley Favor   Fri May 07, 2022 11:37 AM) On  hold per patient preference  rivaroxaban (XARELTO) 20 MG TABS tablet [id number] Yes Take 1 tablet (20 mg total) by mouth every evening. Nafziger, Tommi Rumps, NP Taking Active Self  triamcinolone cream (KENALOG) 0.1 % [id number] Yes APPLY TOPICALLY TWICE DAILY  Patient taking differently: Apply 1 Application topically 2 (two) times daily as needed (skin irritation/flares.).   Nafziger,  Tommi Rumps, NP Taking Active Father, Self            SDOH:  (Social Determinants of Health) assessments and interventions performed: Yes SDOH Interventions    Flowsheet Row Chronic Care Management from 01/22/2022 in [place] at [place] from 10/08/2021 in [place] at [place] Management from 10/15/2020 in [place] at [place] from 09/29/2020 in [place] at [place] Interventions -- Intervention Not Indicated -- Intervention Not Indicated  Housing Interventions -- Intervention Not Indicated -- Intervention Not Indicated  Transportation Interventions -- Intervention Not Indicated Intervention Not Indicated Intervention Not Indicated  Financial Strain Interventions Other (Comment)  [working on Xarelto assistance] Intervention Not Indicated Intervention Not Indicated --  Physical Activity Interventions -- Intervention Not Indicated -- Intervention Not Indicated  Stress Interventions -- Patient Refused -- Intervention Not Indicated  Social Connections Interventions -- Intervention Not Indicated -- Intervention Not Indicated       Medication Assistance:  PAP program for Symbicort has been d/c. Pt states recently approved for Low-Income Subsidy, pending rx being sent to pharmacy to check copay. If unaffordable, will need to consider switching pt back to [facility] and applying for PAP.  Medication Access: Within the past 30 days, how often has patient missed a dose of medication? None Is a pillbox or other method used to improve adherence? Yes -pill packaging Factors that may affect medication adherence? financial need Are meds synced by current pharmacy? Yes  Are meds delivered by current pharmacy? Yes  Does patient experience delays in picking up medications due to transportation concerns? No   [facility]: (Patient  currently enrolled) [facility] - [place], [place] - [address] [place] [address] Phone: [phone] Fax: [phone]   Compliance/Adherence/Medication fill history: Care Gaps: TDAP - Overdue Zoster Vaccine - Overdue Flu Vaccine - Overdue COVID Booster - Overdue Mammogram-  Postponed AWV- 10/08/21  Star-Rating Drugs: Lisinopril-HCTZ 20-12.'5mg'$  tablet: PDC 99%   Assessment/Plan   Hypertension (BP goal <130/80) -Controlled -Current treatment: Lisinopril/HCTZ 20/12.'5mg'$  1 tab daily Appropriate, Effective, Safe, Accessible Metoprolol XL '25mg'$  1 tab daily Appropriate, Effective, Safe, Accessible -Medications previously tried: None  -Current home readings: checks qod 117/84 HR 63 (07/10/21) -Current dietary habits: Watches salt -Current exercise habits: hand-weights daily, out walker daily -Denies hypotensive/hypertensive symptoms -Educated on BP goals and benefits of medications for prevention of heart attack, stroke and kidney damage; Exercise goal of 150 minutes per week; Importance of home blood pressure monitoring; Proper BP monitoring technique; -Counseled to monitor BP at home at least weekly, document, and provide log at future appointments -Counseled on diet and exercise extensively Recommended to continue current medication  Atrial Fibrillation (Goal: prevent stroke and major bleeding) -Controlled -CHADSVASC: 3 -Current treatment: Rate control: Metoprolol '25mg'$  1 tab daily Appropriate, Effective, Safe, Accessible Anticoagulation: Xarelto '20mg'$  1 tab every evening Appropriate, Effective, Safe, Accessible -Medications previously tried: Flecainide  -Home BP and HR readings: see above  -Counseled on increased risk of stroke due to Afib and benefits of anticoagulation for stroke prevention; importance of adherence to anticoagulant exactly as prescribed; seeking medical attention after a head injury or if there is  blood in the urine/stool; -Recommended to continue current medication Assessed patient finances. No issues obtaining xarelto at this time.  [place] Pharmacist [phone]

## 2022-07-09 ENCOUNTER — Telehealth: Payer: Self-pay

## 2022-07-09 NOTE — Progress Notes (Signed)
Patient ID: Glenda Bauer, female   DOB: 1950-06-27, 72 y.o.   MRN: [id number]  Care Management & Coordination Services Pharmacy Team  Reason for Encounter: Appointment Reminder  Contacted patient to confirm telephone appointment with Theo Dills, PharmD on 07/12/22 at 58. Spoke with patient on 07/09/2022   Do you have any problems getting your medications? Yes If yes what types of problems are you experiencing? Financial barriers   What is your top health concern you would like to discuss at your upcoming visit?  Patient was approved for Symbicort PAP last year but is cancelled with [employer] for 2024, still has 1 inhaler left but may need a different medication in its place as it is not avail for renewal.    Care Gaps TDAP - Overdue Zoster Vaccine - Overdue Flu Vaccine - Overdue COVID Booster - Overdue Mammogram- Postponed AWV- 10/08/21   Star Rated Medications Lisinopril- HCTZ 20/12.5 mg - last filled 06/11/22 30 DS at [facility]    Patient Assistance: [place] and me Symbicort discont.  [place] Clinical Pharmacist Assistant [phone]

## 2022-07-12 ENCOUNTER — Ambulatory Visit: Payer: Medicare Other

## 2022-07-13 ENCOUNTER — Other Ambulatory Visit: Payer: Self-pay

## 2022-07-13 ENCOUNTER — Telehealth: Payer: Self-pay

## 2022-07-13 MED ORDER — BUDESONIDE-FORMOTEROL FUMARATE 160-4.5 MCG/ACT IN AERO: 2.0000 | INHALATION_SPRAY | Freq: Two times a day (BID) | RESPIRATORY_TRACT | 2 refills | Status: DC

## 2022-07-13 MED ORDER — BUDESONIDE-FORMOTEROL FUMARATE 160-4.5 MCG/ACT IN AERO: 2.0000 | INHALATION_SPRAY | Freq: Two times a day (BID) | RESPIRATORY_TRACT | 11 refills | Status: DC

## 2022-07-13 NOTE — Telephone Encounter (Signed)
-----   Message from Maren Reamer, [facility] sent at 07/12/2022 10:33 AM EST ----- Pt needs refills for her Symbicort 160-4.5 inhaler sent to her pharmacy. Was previously getting through PAP but program was discontinued.  Preferred Pharmacy: [facility]   Thank you! [place] Pharmacist [phone]

## 2022-07-13 NOTE — Telephone Encounter (Signed)
-----   Message from Maren Reamer, [facility] sent at 07/12/2022 10:33 AM EST ----- Pt needs refills for her Symbicort 160-4.5 inhaler sent to her pharmacy. Was previously getting through PAP but program was discontinued.  Preferred Pharmacy: [facility]   Thank you! [place] Pharmacist [phone]

## 2022-07-13 NOTE — Telephone Encounter (Signed)
Rx refilled electronically °

## 2022-07-22 DIAGNOSIS — G4733 Obstructive sleep apnea (adult) (pediatric): Secondary | ICD-10-CM | POA: Diagnosis not present

## 2022-08-04 ENCOUNTER — Telehealth: Payer: Self-pay

## 2022-08-04 NOTE — Progress Notes (Unsigned)
Patient ID: Glenda Bauer, female   DOB: Sep 22, 1950, 72 y.o.   MRN: [id number]  Care Management & Coordination Services Pharmacy Team  Reason for Encounter: Medication coordination and delivery  Contacted patient to discuss medications and coordinate delivery from [facility]. {US HC Outreach:28874} Cycle dispensing form sent to *** for review.   Last adherence delivery date:07/15/22       Patient is due for next adherence delivery on: 08/16/22  This delivery to include: Adherence Packaging  30 Days  ***  Patient declined the following medications this month: Ezetimibe 10 mg: Take one tab daily at breakfast Xarelto 20 mg: Take one tab at dinner Montelukast 10 mg :Take one tablet at Bedtime Lisinopril-HCTZ 20-12.5 mg : Take one tablet at Breakfast Metropolol Succinate 25 mg - Take one tablet at Breakfast Fluoxetine 20 mg : Take one capsule at Breakfast Alprazolam 0.25 mg :Take 1-2 tablets per day as needed Symbicort?   {refills needed:25320}  {Delivery BK:1911189   Any concerns about your medications? {yes/no:20286}  How often do you forget or accidentally miss a dose? {Missed doses:25554}  Do you use a pillbox? {yes/no:20286}  Is patient in packaging {yes/no:20286}  If yes  What is the date on your next pill pack?  Any concerns or issues with your packaging?   Recent blood pressure readings are as follows:***  Recent blood glucose readings are as follows:***   Chart review: Recent office visits:  None  Recent consult visits:  None  Hospital visits:  Patient was seen at [facility] on 05/12/2022 (6 hours) due to atrial fibrillation ablation.   New?Medications Started at [facility] Discharge:?? -No medications started Medication Changes at Hospital Discharge: No medication changes Medications Discontinued at Hospital Discharge: No medications discontinued Medications that remain the same after Hospital Discharge:??  -All other  medications will remain the same.    Medications: Outpatient Encounter Medications as of 08/04/2022  Medication Sig Note   albuterol (VENTOLIN HFA) 108 (90 Base) MCG/ACT inhaler INHALE TWO PUFFS BY MOUTH INTO LUNGS EVERY 6 HOURS AS NEEDED FOR SHORTNESS OF BREATH OR FOR WHEEZING    ALPRAZolam (XANAX) 0.25 MG tablet TAKE 1 TO 2 TABLETS BY MOUTH ONCE daily AS NEEDED    budesonide-formoterol (SYMBICORT) 160-4.5 MCG/ACT inhaler Inhale 2 puffs into the lungs 2 (two) times daily.    Evolocumab (REPATHA SURECLICK) [id number] MG/ML SOAJ Inject into the skin.    ezetimibe (ZETIA) 10 MG tablet Take 1 tablet (10 mg total) by mouth daily.    famotidine (PEPCID) 20 MG tablet Take 20 mg by mouth daily as needed for indigestion or heartburn.    FLUoxetine (PROZAC) 20 MG capsule TAKE ONE CAPSULE BY MOUTH ONCE DAILY    fluticasone (FLONASE SENSIMIST) 27.5 MCG/SPRAY nasal spray Place 1 spray into the nose in the morning.    levalbuterol (XOPENEX) 0.63 MG/3ML nebulizer solution Take 3 mLs (0.63 mg total) by nebulization every 6 (six) hours as needed for wheezing or shortness of breath.    levocetirizine (XYZAL) 5 MG tablet Take 5 mg by mouth every other day. In the evening.    lisinopril-hydrochlorothiazide (ZESTORETIC) 20-12.5 MG tablet Take 1 tablet by mouth daily.    metoprolol succinate (TOPROL-XL) 25 MG 24 hr tablet Take 1 tablet (25 mg total) by mouth daily. Take with or immediately following a meal.    montelukast (SINGULAIR) 10 MG tablet TAKE ONE TABLET BY MOUTH EVERYDAY AT BEDTIME    Multiple Vitamin (MULTIVITAMIN) tablet Take 1 tablet by mouth every evening.  Multiple Vitamins-Minerals (VISION FORMULA PO) Take 1 capsule by mouth every evening.    Omega-3 Fatty Acids (FISH OIL PO) Take 1 capsule by mouth daily. 05/07/2022: On  hold per patient preference   rivaroxaban (XARELTO) 20 MG TABS tablet Take 1 tablet (20 mg total) by mouth every evening.    triamcinolone cream (KENALOG) 0.1 % APPLY TOPICALLY TWICE DAILY  (Patient taking differently: Apply 1 Application topically 2 (two) times daily as needed (skin irritation/flares.).)    No facility-administered encounter medications on file as of 08/04/2022.   BP Readings from Last 3 Encounters:  06/23/22 122/75  06/21/22 138/88  05/12/22 116/78    Pulse Readings from Last 3 Encounters:  06/23/22 (!) 56  06/21/22 (!) 55  05/12/22 69    Lab Results  Component Value Date/Time   HGBA1C 5.6 05/08/2021 09:34 AM   Lab Results  Component Value Date   CREATININE 0.90 04/21/2022   BUN 21 04/21/2022   GFR 62.30 05/08/2021   GFRNONAA 59 (L) 05/21/2020   GFRAA 68 05/21/2020   NA 140 04/21/2022   K 4.0 04/21/2022   CALCIUM 9.8 04/21/2022   CO2 25 04/21/2022      [place] Clinical Pharmacist Assistant [phone]

## 2022-08-11 ENCOUNTER — Encounter: Payer: Self-pay | Admitting: Cardiology

## 2022-08-11 ENCOUNTER — Ambulatory Visit: Payer: Medicare Other | Attending: Cardiology | Admitting: Cardiology

## 2022-08-11 VITALS — BP 130/82 | HR 61 | Ht 66.0 in | Wt 176.0 lb

## 2022-08-11 DIAGNOSIS — I2510 Atherosclerotic heart disease of native coronary artery without angina pectoris: Secondary | ICD-10-CM | POA: Diagnosis not present

## 2022-08-11 DIAGNOSIS — I480 Paroxysmal atrial fibrillation: Secondary | ICD-10-CM

## 2022-08-11 DIAGNOSIS — D6869 Other thrombophilia: Secondary | ICD-10-CM

## 2022-08-11 NOTE — Patient Instructions (Signed)
Medication Instructions:  Your physician recommends that you continue on your current medications as directed. Please refer to the Current Medication list given to you today.  *If you need a refill on your cardiac medications before your next appointment, please call your pharmacy*   Lab Work: None ordered   Testing/Procedures: None ordered   Follow-Up: At [facility], you and your health needs are our priority.  As part of our continuing mission to provide you with exceptional heart care, we have created designated Provider Care Teams.  These Care Teams include your primary Cardiologist (physician) and Advanced Practice Providers (APPs -  Physician Assistants and Nurse Practitioners) who all work together to provide you with the care you need, when you need it.  Your next appointment:   6 month(s)  The format for your next appointment:   In Person  Provider:   You will follow up in the [facility] located at [facility]. Your provider will be: Luther Bradley, PA    Thank you for choosing [facility]!!   Trinidad Curet, RN [phone]

## 2022-08-11 NOTE — Progress Notes (Signed)
Electrophysiology Office Note   Date:  08/11/2022   ID:  Glenda Bauer, DOB 12/09/1950, MRN [id number]  PCP:  [name], NP  Cardiologist:  Johney Frame Primary Electrophysiologist:  Anitria Andon Meredith Leeds, MD    Chief Complaint: AF   History of Present Illness: Glenda Bauer is a 72 y.o. female who is being seen today for the evaluation of AF at the request of [name], NP. Presenting today for electrophysiology evaluation.  She has a history seen for hypertension, coronary artery disease, cutaneous T-cell lymphoma, atrial fibrillation.  She was diagnosed with atrial fibrillation in 2021. She is currently on Xarelto.  Her symptoms are fatigue, dyspnea on exertion.  Coronary CT shows nonobstructive coronary artery disease and thus flecainide was discontinued.  She is now s/p ablation on 05/12/22 and did well without complication. She was in SR upon discharge. She states she possibly had a brief episode of Afib 4 days after the ablation but it was very brief.   Seen in the Afib clinic on 06/21/22 and appears to be maintaining SR. She notes to have more energy. Compliant with Xarelto.   On follow up today, she is doing well overall. She notes improvement in energy and denies any episodes of AF. She has been compliant with Xarelto and Toprol daily. She is trying to walk more and is currently able to do about 1.5 miles daily. No bleeding concerns.  Today, she denies symptoms of palpitations, chest pain, shortness of breath, orthopnea, PND, lower extremity edema, claudication, dizziness, presyncope, syncope, bleeding, or neurologic sequela. The patient is tolerating medications without difficulties.    Past Medical History:  Diagnosis Date   Allergy    Anxiety    Asthma    Atrial fibrillation ([place])    Cancer (HCC)    Melanoma and Mycosis Fungosis   Cataract    Cutaneous T-cell lymphoma ([place]) 2011   Dermatologist treated in [place]; Dr. Hipolito Bayley   Depression     GERD (gastroesophageal reflux disease)    Heart murmur    Hyperlipidemia    Hypertension    Neuromuscular disorder ([place])    Osteoarthritis    Sebaceous cyst 2022   right vulva   Thyroid nodule    Urinary tract infection    Past Surgical History:  Procedure Laterality Date   ATRIAL FIBRILLATION ABLATION N/A 05/12/2022   Procedure: ATRIAL FIBRILLATION ABLATION;  Surgeon: Constance Haw, MD;  Location: [facility];  Service: Cardiovascular;  Laterality: N/A;   Cataracts Bilateral    CHOLECYSTECTOMY  2015   melenoma removal  1979   Sinus Polyps        Current Outpatient Medications  Medication Sig Dispense Refill   albuterol (VENTOLIN HFA) 108 (90 Base) MCG/ACT inhaler INHALE TWO PUFFS BY MOUTH INTO LUNGS EVERY 6 HOURS AS NEEDED FOR SHORTNESS OF BREATH OR FOR WHEEZING 8.5 g 1   ALPRAZolam (XANAX) 0.25 MG tablet TAKE 1 TO 2 TABLETS BY MOUTH ONCE daily AS NEEDED 30 tablet 2   budesonide-formoterol (SYMBICORT) 160-4.5 MCG/ACT inhaler Inhale 2 puffs into the lungs 2 (two) times daily. 1 each 2   Evolocumab (REPATHA SURECLICK) XX123456 MG/ML SOAJ Inject into the skin.     ezetimibe (ZETIA) 10 MG tablet Take 1 tablet (10 mg total) by mouth daily. 90 tablet 3   famotidine (PEPCID) 20 MG tablet Take 20 mg by mouth daily as needed for indigestion or heartburn.     FLUAD QUADRIVALENT 0.5 ML injection Inject 0.5 mLs into the  muscle once.     FLUoxetine (PROZAC) 20 MG capsule TAKE ONE CAPSULE BY MOUTH ONCE DAILY 90 capsule 1   fluticasone (FLONASE SENSIMIST) 27.5 MCG/SPRAY nasal spray Place 1 spray into the nose in the morning.     levalbuterol (XOPENEX) 0.63 MG/3ML nebulizer solution Take 3 mLs (0.63 mg total) by nebulization every 6 (six) hours as needed for wheezing or shortness of breath. 225 mL 6   levocetirizine (XYZAL) 5 MG tablet Take 5 mg by mouth every other day. In the evening.     lisinopril-hydrochlorothiazide (ZESTORETIC) 20-12.5 MG tablet Take 1 tablet by mouth daily. 90  tablet 3   metoprolol succinate (TOPROL-XL) 25 MG 24 hr tablet Take 1 tablet (25 mg total) by mouth daily. Take with or immediately following a meal. 90 tablet 1   montelukast (SINGULAIR) 10 MG tablet TAKE ONE TABLET BY MOUTH EVERYDAY AT BEDTIME 90 tablet 3   Multiple Vitamin (MULTIVITAMIN) tablet Take 1 tablet by mouth every evening.     Multiple Vitamins-Minerals (VISION FORMULA PO) Take 1 capsule by mouth every evening.     Omega-3 Fatty Acids (FISH OIL PO) Take 1 capsule by mouth daily.     rivaroxaban (XARELTO) 20 MG TABS tablet Take 1 tablet (20 mg total) by mouth every evening. 28 tablet 0   triamcinolone cream (KENALOG) 0.1 % APPLY TOPICALLY TWICE DAILY (Patient taking differently: Apply 1 Application topically 2 (two) times daily as needed (skin irritation/flares.).) 454 g 0   No current facility-administered medications for this visit.    Allergies:   Patient has no known allergies.   Social History:  The patient  reports that she quit smoking about 41 years ago. Her smoking use included cigarettes. She has a 2.50 pack-year smoking history. She has been exposed to tobacco smoke. She has never used smokeless tobacco. She reports that she does not drink alcohol and does not use drugs.   Family History:  The patient's family history includes Arthritis in her mother; Breast cancer (age of onset: 40) in her mother; Crohn's disease in her brother; Ovarian cancer in her mother; Parkinson's disease in her father; Schizophrenia in her brother; Thyroid disease in her mother.    ROS:  Please see the history of present illness.   Otherwise, review of systems is positive for none.   All other systems are reviewed and negative.    PHYSICAL EXAM: VS:  BP 130/82   Pulse 61   Ht '5\' 6"'$  (1.676 m)   Wt 176 lb (79.8 kg)   LMP 06/07/2000 (Approximate)   SpO2 96%   BMI 28.41 kg/m  , BMI Body mass index is 28.41 kg/m. GEN: Well nourished, well developed, in no acute distress  HEENT: normal  Neck:  no JVD, carotid bruits, or masses Cardiac: RRR; no murmurs, rubs, or gallops,no edema  Respiratory:  clear to auscultation bilaterally, normal work of breathing GI: soft, nontender, nondistended, + BS MS: no deformity or atrophy  Skin: warm and dry Neuro:  Strength and sensation are intact Psych: euthymic mood, full affect  EKG:  EKG  is ordered today. Personal review of the ekg ordered today shows sinus rhythm, rate 61  Recent Labs: 04/21/2022: BUN 21; Creatinine, Ser 0.90; Hemoglobin 14.2; Platelets 278; Potassium 4.0; Sodium 140    Lipid Panel     Component Value Date/Time   CHOL 124 05/11/2022 0923   TRIG 97 05/11/2022 0923   HDL 54 05/11/2022 0923   CHOLHDL 2.3 05/11/2022 0923   CHOLHDL  2 05/08/2021 0934   VLDL 15.8 05/08/2021 0934   LDLCALC 52 05/11/2022 0923   LDLCALC 110 (H) 03/27/2020 0917     Wt Readings from Last 3 Encounters:  08/11/22 176 lb (79.8 kg)  06/23/22 173 lb (78.5 kg)  06/21/22 173 lb 12.8 oz (78.8 kg)      Other studies Reviewed: Additional studies/ records that were reviewed today include: TTE 12/09/21  Review of the above records today demonstrates:   1. Left ventricular ejection fraction, by estimation, is 60 to 65%. The  left ventricle has normal function. The left ventricle has no regional  wall motion abnormalities. Left ventricular diastolic parameters are  consistent with Grade I diastolic  dysfunction (impaired relaxation).   2. Right ventricular systolic function is normal. The right ventricular  size is normal.   3. The mitral valve is grossly normal. Mild mitral valve regurgitation.  No evidence of mitral stenosis.   4. The aortic valve is tricuspid. There is mild calcification of the  aortic valve. There is mild thickening of the aortic valve. Aortic valve  regurgitation is mild to moderate. Aortic valve sclerosis/calcification is  present, without any evidence of  aortic stenosis.   5. Aortic dilatation noted. There is  mild-to-moderate dilatation of the  ascending aorta, measuring 45 mm. There is borderline dilatation of the  aortic arch, measuring 39 mm.   6. The inferior vena cava is normal in size with greater than 50%  respiratory variability, suggesting right atrial pressure of 3 mmHg.   Coronary CTA 1. Coronary calcium score of 543. This was 91st percentile for age-, race-, and sex-matched controls.   2. Normal coronary origin with right dominance.   3.  Ascending aorta aneurysm (4.4cm.)   4. Severe (>70%) soft plaque in the mid LAD. Minimal (<25%) plaque in LM and proximal LAD. CAD-RADS 4.  1.  FFRct findings are consistent with non-obstructive disease.   2. Recommend aggressive risk factor modification, including LDL goal <70.  ASSESSMENT AND PLAN:  1.  Paroxysmal atrial fibrillation: CHA2DS2-VASc of 4.  Currently on Xarelto 20 mg daily, Toprol-XL 25 mg daily. She is s/p ablation on 05/12/22 and doing well overall.  She is in SR today and feels she is doing much better. We discussed her medication regimen and at this time Glenda Bauer make no changes.  She Glenda Bauer continue on Xarelto and Toprol daily.   2.  Secondary hypercoagulable state: Currently on Xarelto for atrial fibrillation as above  3.  Hypertension: well controlled  4.  Coronary artery disease: Elevated calcium score with moderate disease.  No current chest pain.  Plan per primary cardiology.  6.  Hyperlipidemia: Continue Zetia per primary cardiology   Current medicines are reviewed at length with the patient today.   The patient does not have concerns regarding her medicines.  The following changes were made today:  none  Labs/ tests ordered today include:  Orders Placed This Encounter  Procedures   EKG 12-Lead     Disposition:   FU with Afib clinic in 6 months.  Signed, Glenda Butrick Meredith Leeds, MD  08/11/2022 3:44 PM     [place] [address] [place] [address] [phone]  (office) [phone] (fax)  I have seen and examined this patient with Glenda Bauer.  Agree with above, note added to reflect my findings.  She is currently feeling well.  She has no chest pain or shortness of breath.  She has not had any further episodes of  atrial fibrillation since her ablation.  She has more energy and less fatigue and weakness.  Happy with her control.  GEN: Well nourished, well developed, in no acute distress  HEENT: normal  Neck: no JVD, carotid bruits, or masses Cardiac: RRR; no murmurs, rubs, or gallops,no edema  Respiratory:  clear to auscultation bilaterally, normal work of breathing GI: soft, nontender, nondistended, + BS MS: no deformity or atrophy  Skin: warm and dry Neuro:  Strength and sensation are intact Psych: euthymic mood, full affect   Paroxysmal atrial fibrillation: Remains in sinus rhythm.  CHA2DS2-VASc of 4.  Currently on Xarelto and metoprolol.  Has had no further episodes since ablation.  Glenda Bauer continue with current management.   Second hypercoagulable state: Currently on Xarelto for atrial fibrillation Hypertension: Currently well-controlled  Glenda Millhouse M. Glenda Price MD 08/11/2022 3:44 PM

## 2022-09-01 ENCOUNTER — Telehealth: Payer: Self-pay

## 2022-09-01 NOTE — Progress Notes (Unsigned)
Patient ID: Glenda Bauer, female   DOB: 1950-06-11, 72 y.o.   MRN: [id number]  Care Management & Coordination Services Pharmacy Team  Reason for Encounter: Medication coordination and delivery  Contacted patient to discuss medications and coordinate delivery from [facility]. {US HC Outreach:28874} Cycle dispensing form sent to *** for review.   Last adherence delivery date: 08/16/22  Patient is due for next adherence delivery on: 09/14/22  This delivery to include: Adherence Packaging  30 Days  Ezetimibe 10 mg: Take one tab daily at breakfast Xarelto 20 mg: Take one tab at dinner Montelukast 10 mg :Take one tablet at Bedtime Lisinopril-HCTZ 20-12.5 mg : Take one tablet at Breakfast Metropolol Succinate 25 mg - Take one tablet at Breakfast Fluoxetine 20 mg : Take one capsule at Breakfast Alprazolam 0.25 mg :Take 1-2 tablets per day as needed Symbicort    Patient declined the following medications this month: ***  {refills needed:25320}  {Delivery UJ:6107908   Any concerns about your medications? {yes/no:20286}  How often do you forget or accidentally miss a dose? {Missed doses:25554}  Do you use a pillbox? {yes/no:20286}  Is patient in packaging {yes/no:20286}  If yes  What is the date on your next pill pack?  Any concerns or issues with your packaging?   Recent blood pressure readings are as follows:***  Recent blood glucose readings are as follows:***   Chart review: Recent office visits:  None  Recent consult visits:  08/11/22 Constance Haw, MD (Cardiology) - Patient presented for Paroxysmal atrial fibrillation and other concerns. No medication changes.   Hospital visits:  Patient was seen at [facility] on 05/12/2022 (6 hours) due to atrial fibrillation ablation.   New?Medications Started at [facility] Discharge:?? -No medications started Medication Changes at Hospital Discharge: No medication changes Medications  Discontinued at Hospital Discharge: No medications discontinued Medications that remain the same after Hospital Discharge:??  -All other medications will remain the same.      Medications: Outpatient Encounter Medications as of 09/01/2022  Medication Sig Note   albuterol (VENTOLIN HFA) 108 (90 Base) MCG/ACT inhaler INHALE TWO PUFFS BY MOUTH INTO LUNGS EVERY 6 HOURS AS NEEDED FOR SHORTNESS OF BREATH OR FOR WHEEZING    ALPRAZolam (XANAX) 0.25 MG tablet TAKE 1 TO 2 TABLETS BY MOUTH ONCE daily AS NEEDED    budesonide-formoterol (SYMBICORT) 160-4.5 MCG/ACT inhaler Inhale 2 puffs into the lungs 2 (two) times daily.    Evolocumab (REPATHA SURECLICK) XX123456 MG/ML SOAJ Inject into the skin.    ezetimibe (ZETIA) 10 MG tablet Take 1 tablet (10 mg total) by mouth daily.    famotidine (PEPCID) 20 MG tablet Take 20 mg by mouth daily as needed for indigestion or heartburn.    FLUAD QUADRIVALENT 0.5 ML injection Inject 0.5 mLs into the muscle once.    FLUoxetine (PROZAC) 20 MG capsule TAKE ONE CAPSULE BY MOUTH ONCE DAILY    fluticasone (FLONASE SENSIMIST) 27.5 MCG/SPRAY nasal spray Place 1 spray into the nose in the morning.    levalbuterol (XOPENEX) 0.63 MG/3ML nebulizer solution Take 3 mLs (0.63 mg total) by nebulization every 6 (six) hours as needed for wheezing or shortness of breath.    levocetirizine (XYZAL) 5 MG tablet Take 5 mg by mouth every other day. In the evening.    lisinopril-hydrochlorothiazide (ZESTORETIC) 20-12.5 MG tablet Take 1 tablet by mouth daily.    metoprolol succinate (TOPROL-XL) 25 MG 24 hr tablet Take 1 tablet (25 mg total) by mouth daily. Take with or immediately  following a meal.    montelukast (SINGULAIR) 10 MG tablet TAKE ONE TABLET BY MOUTH EVERYDAY AT BEDTIME    Multiple Vitamin (MULTIVITAMIN) tablet Take 1 tablet by mouth every evening.    Multiple Vitamins-Minerals (VISION FORMULA PO) Take 1 capsule by mouth every evening.    Omega-3 Fatty Acids (FISH OIL PO) Take 1 capsule  by mouth daily. 05/07/2022: On  hold per patient preference   rivaroxaban (XARELTO) 20 MG TABS tablet Take 1 tablet (20 mg total) by mouth every evening.    triamcinolone cream (KENALOG) 0.1 % APPLY TOPICALLY TWICE DAILY (Patient taking differently: Apply 1 Application topically 2 (two) times daily as needed (skin irritation/flares.).)    No facility-administered encounter medications on file as of 09/01/2022.   BP Readings from Last 3 Encounters:  08/11/22 130/82  06/23/22 122/75  06/21/22 138/88    Pulse Readings from Last 3 Encounters:  08/11/22 61  06/23/22 (!) 56  06/21/22 (!) 55    Lab Results  Component Value Date/Time   HGBA1C 5.6 05/08/2021 09:34 AM   Lab Results  Component Value Date   CREATININE 0.90 04/21/2022   BUN 21 04/21/2022   GFR 62.30 05/08/2021   GFRNONAA 59 (L) 05/21/2020   GFRAA 68 05/21/2020   NA 140 04/21/2022   K 4.0 04/21/2022   CALCIUM 9.8 04/21/2022   CO2 25 04/21/2022      [place] Clinical Pharmacist Assistant [phone]

## 2022-09-02 ENCOUNTER — Other Ambulatory Visit: Payer: Self-pay | Admitting: Adult Health

## 2022-09-02 DIAGNOSIS — F32A Depression, unspecified: Secondary | ICD-10-CM

## 2022-09-02 MED ORDER — ALPRAZOLAM 0.25 MG PO TABS: ORAL_TABLET | ORAL | 2 refills | Status: DC

## 2022-09-30 ENCOUNTER — Telehealth: Payer: Self-pay

## 2022-09-30 NOTE — Progress Notes (Signed)
Patient ID: Glenda Bauer, female   DOB: Nov 08, 1950, 72 y.o.   MRN: [id number] Care Management & Coordination Services Pharmacy Team  Reason for Encounter: Medication coordination and delivery  Contacted patient to discuss medications and coordinate delivery from [facility]. Spoke with patient on 10/05/2022  Cycle dispensing form sent to [facility] for review.   Last adherence delivery date:09/14/22      Patient is due for next adherence delivery on: 10/14/22  This delivery to include: Adherence Packaging  30 Days  Ezetimibe 10 mg: Take one tab daily at breakfast Xarelto 20 mg: Take one tab at dinner Montelukast 10 mg :Take one tablet at Bedtime Lisinopril-HCTZ 20-12.5 mg : Take one tablet at Breakfast Metropolol Succinate 25 mg - Take one tablet at Breakfast Fluoxetine 20 mg : Take one capsule at Breakfast Alprazolam 0.25 mg :Take 1-2 tablets per day as needed Symbicort    Refills requested from providers include: Triamcinolone cream (PCP)  Confirmed delivery date of 10/14/22, advised patient that pharmacy will contact them the morning of delivery.   Any concerns about your medications? No  How often do you forget or accidentally miss a dose? Never  Do you use a pillbox? No  Is patient in packaging Yes     Chart review: Recent office visits:  None  Recent consult visits:  08/11/22 Regan Lemming, MD (Cardiology) - Patient presented for Paroxysmal atrial fibrillation and other concerns. No medication changes.   Hospital visits:  Patient was seen at [facility] on 05/12/2022 (6 hours) due to atrial fibrillation ablation.   New?Medications Started at [facility] Discharge:?? -No medications started Medication Changes at Hospital Discharge: No medication changes Medications Discontinued at Hospital Discharge: No medications discontinued Medications that remain the same after Hospital Discharge:??  -All other medications will remain the  same  Medications: Outpatient Encounter Medications as of 09/30/2022  Medication Sig Note   albuterol (VENTOLIN HFA) 108 (90 Base) MCG/ACT inhaler INHALE TWO PUFFS BY MOUTH INTO LUNGS EVERY 6 HOURS AS NEEDED FOR SHORTNESS OF BREATH OR FOR WHEEZING    ALPRAZolam (XANAX) 0.25 MG tablet TAKE 1 TO 2 TABLETS BY MOUTH ONCE daily AS NEEDED    budesonide-formoterol (SYMBICORT) 160-4.5 MCG/ACT inhaler Inhale 2 puffs into the lungs 2 (two) times daily.    Evolocumab (REPATHA SURECLICK) 140 MG/ML SOAJ Inject into the skin.    ezetimibe (ZETIA) 10 MG tablet Take 1 tablet (10 mg total) by mouth daily.    famotidine (PEPCID) 20 MG tablet Take 20 mg by mouth daily as needed for indigestion or heartburn.    FLUAD QUADRIVALENT 0.5 ML injection Inject 0.5 mLs into the muscle once.    FLUoxetine (PROZAC) 20 MG capsule TAKE ONE CAPSULE BY MOUTH ONCE DAILY    fluticasone (FLONASE SENSIMIST) 27.5 MCG/SPRAY nasal spray Place 1 spray into the nose in the morning.    levalbuterol (XOPENEX) 0.63 MG/3ML nebulizer solution Take 3 mLs (0.63 mg total) by nebulization every 6 (six) hours as needed for wheezing or shortness of breath.    levocetirizine (XYZAL) 5 MG tablet Take 5 mg by mouth every other day. In the evening.    lisinopril-hydrochlorothiazide (ZESTORETIC) 20-12.5 MG tablet Take 1 tablet by mouth daily.    metoprolol succinate (TOPROL-XL) 25 MG 24 hr tablet Take 1 tablet (25 mg total) by mouth daily. Take with or immediately following a meal.    montelukast (SINGULAIR) 10 MG tablet TAKE ONE TABLET BY MOUTH EVERYDAY AT BEDTIME    Multiple Vitamin (  MULTIVITAMIN) tablet Take 1 tablet by mouth every evening.    Multiple Vitamins-Minerals (VISION FORMULA PO) Take 1 capsule by mouth every evening.    Omega-3 Fatty Acids (FISH OIL PO) Take 1 capsule by mouth daily. 05/07/2022: On  hold per patient preference   rivaroxaban (XARELTO) 20 MG TABS tablet Take 1 tablet (20 mg total) by mouth every evening.    triamcinolone  cream (KENALOG) 0.1 % APPLY TOPICALLY TWICE DAILY (Patient taking differently: Apply 1 Application topically 2 (two) times daily as needed (skin irritation/flares.).)    No facility-administered encounter medications on file as of 09/30/2022.   BP Readings from Last 3 Encounters:  08/11/22 130/82  06/23/22 122/75  06/21/22 138/88    Pulse Readings from Last 3 Encounters:  08/11/22 61  06/23/22 (!) 56  06/21/22 (!) 55    Lab Results  Component Value Date/Time   HGBA1C 5.6 05/08/2021 09:34 AM   Lab Results  Component Value Date   CREATININE 0.90 04/21/2022   BUN 21 04/21/2022   GFR 62.30 05/08/2021   GFRNONAA 59 (L) 05/21/2020   GFRAA 68 05/21/2020   NA 140 04/21/2022   K 4.0 04/21/2022   CALCIUM 9.8 04/21/2022   CO2 25 04/21/2022      Pamala Duffel CMA Clinical Pharmacist Assistant [phone]

## 2022-10-06 ENCOUNTER — Telehealth: Payer: Self-pay

## 2022-10-06 DIAGNOSIS — C84A0 Cutaneous T-cell lymphoma, unspecified, unspecified site: Secondary | ICD-10-CM

## 2022-10-06 MED ORDER — TRIAMCINOLONE ACETONIDE 0.1 % EX CREA: TOPICAL_CREAM | Freq: Two times a day (BID) | CUTANEOUS | 0 refills | Status: DC

## 2022-10-06 NOTE — Telephone Encounter (Signed)
Rx refilled electronically °

## 2022-10-06 NOTE — Telephone Encounter (Signed)
-----   Message from Sherrill Raring, [facility] sent at 10/05/2022  9:24 AM EDT ----- Regarding: Med Refill [facility] requesting refill on behalf of patient for   Triamcinolone 0.1% 454 gram jar, apply BID to skin irritations as needed.  Pharmacy Info: [facility] - [place], [place] - [address]  Phone: [phone] Fax: [phone]   Thank you! Sherrill Raring Clinical Pharmacist [phone]

## 2022-10-11 ENCOUNTER — Ambulatory Visit (INDEPENDENT_AMBULATORY_CARE_PROVIDER_SITE_OTHER): Payer: Medicare Other

## 2022-10-11 VITALS — Ht 66.0 in | Wt 176.0 lb

## 2022-10-11 DIAGNOSIS — Z0000 Encounter for general adult medical examination without abnormal findings: Secondary | ICD-10-CM | POA: Diagnosis not present

## 2022-10-11 DIAGNOSIS — Z1231 Encounter for screening mammogram for malignant neoplasm of breast: Secondary | ICD-10-CM | POA: Diagnosis not present

## 2022-10-11 NOTE — Patient Instructions (Addendum)
Glenda Bauer , Thank you for taking time to come for your Medicare Wellness Visit. I appreciate your ongoing commitment to your health goals. Please review the following plan we discussed and let me know if I can assist you in the future.   These are the goals we discussed:  Goals       Exercise 3x per week (30 min per time)      Lose weight (pt-stated)      I want to become heart healthy.      Manage My Medicine      Timeframe:  Short-Term Goal Priority:  High Start Date:                             Expected End Date:                       Follow Up Date 03/02/2021    - keep a list of all the medicines I take; vitamins and herbals too - use an alarm clock or phone to remind me to take my medicine    Why is this important?   These steps will help you keep on track with your medicines.   Notes:       Weight < 160 lb (72.6 kg) (pt-stated)      Goal is to start walking again up to 3 miles.         This is a list of the screening recommended for you and due dates:  Health Maintenance  Topic Date Due   DTaP/Tdap/Td vaccine (1 - Tdap) Never done   Mammogram  08/30/2021   COVID-19 Vaccine (4 - 2023-24 season) 10/27/2022*   Zoster (Shingles) Vaccine (1 of 2) 01/11/2023*   Flu Shot  01/06/2023   Medicare Annual Wellness Visit  10/11/2023   Pneumonia Vaccine  Completed   DEXA scan (bone density measurement)  Completed   Hepatitis C Screening: USPSTF Recommendation to screen - Ages 31-79 yo.  Completed   HPV Vaccine  Aged Out   Colon Cancer Screening  Discontinued  *Topic was postponed. The date shown is not the original due date.    Advanced directives: Advance directive discussed with you today. Even though you declined this today, please call our office should you change your mind, and we can give you the proper paperwork for you to fill out.   Conditions/risks identified: None  Next appointment: Follow up in one year for your annual wellness visit    Preventive  Care 65 Years and Older, Female Preventive care refers to lifestyle choices and visits with your health care provider that can promote health and wellness. What does preventive care include? A yearly physical exam. This is also called an annual well check. Dental exams once or twice a year. Routine eye exams. Ask your health care provider how often you should have your eyes checked. Personal lifestyle choices, including: Daily care of your teeth and gums. Regular physical activity. Eating a healthy diet. Avoiding tobacco and drug use. Limiting alcohol use. Practicing safe sex. Taking low-dose aspirin every day. Taking vitamin and mineral supplements as recommended by your health care provider. What happens during an annual well check? The services and screenings done by your health care provider during your annual well check will depend on your age, overall health, lifestyle risk factors, and family history of disease. Counseling  Your health care provider may ask you questions about your:  Alcohol use. Tobacco use. Drug use. Emotional well-being. Home and relationship well-being. Sexual activity. Eating habits. History of falls. Memory and ability to understand (cognition). Work and work Astronomer. Reproductive health. Screening  You may have the following tests or measurements: Height, weight, and BMI. Blood pressure. Lipid and cholesterol levels. These may be checked every 5 years, or more frequently if you are over 49 years old. Skin check. Lung cancer screening. You may have this screening every year starting at age 32 if you have a 30-pack-year history of smoking and currently smoke or have quit within the past 15 years. Fecal occult blood test (FOBT) of the stool. You may have this test every year starting at age 21. Flexible sigmoidoscopy or colonoscopy. You may have a sigmoidoscopy every 5 years or a colonoscopy every 10 years starting at age 69. Hepatitis C blood  test. Hepatitis B blood test. Sexually transmitted disease (STD) testing. Diabetes screening. This is done by checking your blood sugar (glucose) after you have not eaten for a while (fasting). You may have this done every 1-3 years. Bone density scan. This is done to screen for osteoporosis. You may have this done starting at age 48. Mammogram. This may be done every 1-2 years. Talk to your health care provider about how often you should have regular mammograms. Talk with your health care provider about your test results, treatment options, and if necessary, the need for more tests. Vaccines  Your health care provider may recommend certain vaccines, such as: Influenza vaccine. This is recommended every year. Tetanus, diphtheria, and acellular pertussis (Tdap, Td) vaccine. You may need a Td booster every 10 years. Zoster vaccine. You may need this after age 69. Pneumococcal 13-valent conjugate (PCV13) vaccine. One dose is recommended after age 21. Pneumococcal polysaccharide (PPSV23) vaccine. One dose is recommended after age 14. Talk to your health care provider about which screenings and vaccines you need and how often you need them. This information is not intended to replace advice given to you by your health care provider. Make sure you discuss any questions you have with your health care provider. Document Released: 06/20/2015 Document Revised: 02/11/2016 Document Reviewed: 03/25/2015 Elsevier Interactive Patient Education  2017 [employer].  Fall Prevention in the Home Falls can cause injuries. They can happen to people of all ages. There are many things you can do to make your home safe and to help prevent falls. What can I do on the outside of my home? Regularly fix the edges of walkways and driveways and fix any cracks. Remove anything that might make you trip as you walk through a door, such as a raised step or threshold. Trim any bushes or trees on the path to your home. Use  bright outdoor lighting. Clear any walking paths of anything that might make someone trip, such as rocks or tools. Regularly check to see if handrails are loose or broken. Make sure that both sides of any steps have handrails. Any raised decks and porches should have guardrails on the edges. Have any leaves, snow, or ice cleared regularly. Use sand or salt on walking paths during winter. Clean up any spills in your garage right away. This includes oil or grease spills. What can I do in the bathroom? Use night lights. Install grab bars by the toilet and in the tub and shower. Do not use towel bars as grab bars. Use non-skid mats or decals in the tub or shower. If you need to sit down in the  shower, use a plastic, non-slip stool. Keep the floor dry. Clean up any water that spills on the floor as soon as it happens. Remove soap buildup in the tub or shower regularly. Attach bath mats securely with double-sided non-slip rug tape. Do not have throw rugs and other things on the floor that can make you trip. What can I do in the bedroom? Use night lights. Make sure that you have a light by your bed that is easy to reach. Do not use any sheets or blankets that are too big for your bed. They should not hang down onto the floor. Have a firm chair that has side arms. You can use this for support while you get dressed. Do not have throw rugs and other things on the floor that can make you trip. What can I do in the kitchen? Clean up any spills right away. Avoid walking on wet floors. Keep items that you use a lot in easy-to-reach places. If you need to reach something above you, use a strong step stool that has a grab bar. Keep electrical cords out of the way. Do not use floor polish or wax that makes floors slippery. If you must use wax, use non-skid floor wax. Do not have throw rugs and other things on the floor that can make you trip. What can I do with my stairs? Do not leave any items on the  stairs. Make sure that there are handrails on both sides of the stairs and use them. Fix handrails that are broken or loose. Make sure that handrails are as long as the stairways. Check any carpeting to make sure that it is firmly attached to the stairs. Fix any carpet that is loose or worn. Avoid having throw rugs at the top or bottom of the stairs. If you do have throw rugs, attach them to the floor with carpet tape. Make sure that you have a light switch at the top of the stairs and the bottom of the stairs. If you do not have them, ask someone to add them for you. What else can I do to help prevent falls? Wear shoes that: Do not have high heels. Have rubber bottoms. Are comfortable and fit you well. Are closed at the toe. Do not wear sandals. If you use a stepladder: Make sure that it is fully opened. Do not climb a closed stepladder. Make sure that both sides of the stepladder are locked into place. Ask someone to hold it for you, if possible. Clearly mark and make sure that you can see: Any grab bars or handrails. First and last steps. Where the edge of each step is. Use tools that help you move around (mobility aids) if they are needed. These include: Canes. Walkers. Scooters. Crutches. Turn on the lights when you go into a dark area. Replace any light bulbs as soon as they burn out. Set up your furniture so you have a clear path. Avoid moving your furniture around. If any of your floors are uneven, fix them. If there are any pets around you, be aware of where they are. Review your medicines with your doctor. Some medicines can make you feel dizzy. This can increase your chance of falling. Ask your doctor what other things that you can do to help prevent falls. This information is not intended to replace advice given to you by your health care provider. Make sure you discuss any questions you have with your health care provider. Document Released: 03/20/2009 Document  Revised:  10/30/2015 Document Reviewed: 06/28/2014 Elsevier Interactive Patient Education  2017 [employer].

## 2022-10-11 NOTE — Progress Notes (Signed)
Subjective:   Glenda Bauer is a 72 y.o. female who presents for Medicare Annual (Subsequent) preventive examination.  Review of Systems    Virtual Visit via Telephone Note  I connected with  Glenda Bauer on 10/11/22 at 10:30 AM EDT by telephone and verified that I am speaking with the correct person using two identifiers.  Location: Patient: Home Provider: Office Persons participating in the virtual visit: patient/Nurse Health Advisor   I discussed the limitations, risks, security and privacy concerns of performing an evaluation and management service by telephone and the availability of in person appointments. The patient expressed understanding and agreed to proceed.  Interactive audio and video telecommunications were attempted between this nurse and patient, however failed, due to patient having technical difficulties OR patient did not have access to video capability.  We continued and completed visit with audio only.  Some vital signs may be absent or patient reported.   Glenda Rung, LPN  Cardiac Risk Factors include: advanced age (>25men, >61 women);hypertension     Objective:    Today's Vitals   10/11/22 1043  Weight: 176 lb (79.8 kg)  Height: 5\' 6"  (1.676 m)   Body mass index is 28.41 kg/m.     10/11/2022   10:50 AM 05/12/2022    9:57 AM 10/08/2021   10:08 AM 11/14/2014    8:52 AM  Advanced Directives  Does Patient Have a Medical Advance Directive? No No No No  Would patient like information on creating a medical advance directive? No - Patient declined No - Patient declined No - Patient declined     Current Medications (verified) Outpatient Encounter Medications as of 10/11/2022  Medication Sig   albuterol (VENTOLIN HFA) 108 (90 Base) MCG/ACT inhaler INHALE TWO PUFFS BY MOUTH INTO LUNGS EVERY 6 HOURS AS NEEDED FOR SHORTNESS OF BREATH OR FOR WHEEZING   ALPRAZolam (XANAX) 0.25 MG tablet TAKE 1 TO 2 TABLETS BY MOUTH ONCE daily AS NEEDED    budesonide-formoterol (SYMBICORT) 160-4.5 MCG/ACT inhaler Inhale 2 puffs into the lungs 2 (two) times daily.   Evolocumab (REPATHA SURECLICK) 140 MG/ML SOAJ Inject into the skin.   ezetimibe (ZETIA) 10 MG tablet Take 1 tablet (10 mg total) by mouth daily.   famotidine (PEPCID) 20 MG tablet Take 20 mg by mouth daily as needed for indigestion or heartburn.   FLUAD QUADRIVALENT 0.5 ML injection Inject 0.5 mLs into the muscle once.   FLUoxetine (PROZAC) 20 MG capsule TAKE ONE CAPSULE BY MOUTH ONCE DAILY   fluticasone (FLONASE SENSIMIST) 27.5 MCG/SPRAY nasal spray Place 1 spray into the nose in the morning.   levalbuterol (XOPENEX) 0.63 MG/3ML nebulizer solution Take 3 mLs (0.63 mg total) by nebulization every 6 (six) hours as needed for wheezing or shortness of breath.   levocetirizine (XYZAL) 5 MG tablet Take 5 mg by mouth every other day. In the evening.   lisinopril-hydrochlorothiazide (ZESTORETIC) 20-12.5 MG tablet Take 1 tablet by mouth daily.   metoprolol succinate (TOPROL-XL) 25 MG 24 hr tablet Take 1 tablet (25 mg total) by mouth daily. Take with or immediately following a meal.   montelukast (SINGULAIR) 10 MG tablet TAKE ONE TABLET BY MOUTH EVERYDAY AT BEDTIME   Multiple Vitamin (MULTIVITAMIN) tablet Take 1 tablet by mouth every evening.   Multiple Vitamins-Minerals (VISION FORMULA PO) Take 1 capsule by mouth every evening.   Omega-3 Fatty Acids (FISH OIL PO) Take 1 capsule by mouth daily.   rivaroxaban (XARELTO) 20 MG TABS tablet Take 1 tablet (20 mg  total) by mouth every evening.   triamcinolone cream (KENALOG) 0.1 % Apply topically 2 (two) times daily.   No facility-administered encounter medications on file as of 10/11/2022.    Allergies (verified) Patient has no known allergies.   History: Past Medical History:  Diagnosis Date   Allergy    Anxiety    Asthma    Atrial fibrillation (HCC)    Cancer (HCC)    Melanoma and Mycosis Fungosis   Cataract    Cutaneous T-cell lymphoma  (HCC) 2011   Dermatologist treated in [place]; Dr. Maggie Schwalbe   Depression    GERD (gastroesophageal reflux disease)    Heart murmur    Hyperlipidemia    Hypertension    Neuromuscular disorder (HCC)    Osteoarthritis    Sebaceous cyst 2022   right vulva   Thyroid nodule    Urinary tract infection    Past Surgical History:  Procedure Laterality Date   ATRIAL FIBRILLATION ABLATION N/A 05/12/2022   Procedure: ATRIAL FIBRILLATION ABLATION;  Surgeon: Regan Lemming, MD;  Location: [facility];  Service: Cardiovascular;  Laterality: N/A;   Cataracts Bilateral    CHOLECYSTECTOMY  2015   melenoma removal  1979   Sinus Polyps      Family History  Problem Relation Age of Onset   Arthritis Mother    Ovarian cancer Mother        Metastatic   Breast cancer Mother 71   Thyroid disease Mother    Parkinson's disease Father    Crohn's disease Brother    Schizophrenia Brother    Colon cancer Neg Hx    Esophageal cancer Neg Hx    Rectal cancer Neg Hx    Stomach cancer Neg Hx    Social History   Socioeconomic History   Marital status: Divorced    Spouse name: Not on file   Number of children: 3   Years of education: 16   Highest education level: Associate degree: academic program  Occupational History   Occupation: Warehouse manager   Tobacco Use   Smoking status: Former    Packs/day: 0.25    Years: 10.00    Additional pack years: 0.00    Total pack years: 2.50    Types: Cigarettes    Quit date: 06/07/1981    Years since quitting: 41.3    Passive exposure: Past   Smokeless tobacco: Never   Tobacco comments:    Former smoker 12/02/21  Vaping Use   Vaping Use: Never used  Substance and Sexual Activity   Alcohol use: No    Alcohol/week: 0.0 standard drinks of alcohol   Drug use: No   Sexual activity: Not Currently    Birth control/protection: Post-menopausal  Other Topics Concern   Not on file  Social History Narrative   Separated    Three children ( one lives  locally)    Social Determinants of Health   Financial Resource Strain: Low Risk  (10/11/2022)   Overall Financial Resource Strain (CARDIA)    Difficulty of Paying Living Expenses: Not hard at all  Food Insecurity: No Food Insecurity (10/11/2022)   Hunger Vital Sign    Worried About Running Out of Food in the Last Year: Never true    Ran Out of Food in the Last Year: Never true  Transportation Needs: No Transportation Needs (10/11/2022)   PRAPARE - Administrator, Civil Service (Medical): No    Lack of Transportation (Non-Medical): No  Physical Activity: Sufficiently Active (  10/11/2022)   Exercise Vital Sign    Days of Exercise per Week: 4 days    Minutes of Exercise per Session: 60 min  Stress: No Stress Concern Present (10/11/2022)   [employer] of Occupational Health - Occupational Stress Questionnaire    Feeling of Stress : Not at all  Social Connections: Moderately Integrated (10/11/2022)   Social Connection and Isolation Panel [NHANES]    Frequency of Communication with Friends and Family: More than three times a week    Frequency of Social Gatherings with Friends and Family: More than three times a week    Attends Religious Services: More than 4 times per year    Active Member of [employer] or Organizations: Yes    Attends Engineer, structural: More than 4 times per year    Marital Status: Divorced    Tobacco Counseling Counseling given: Not Answered Tobacco comments: Former smoker 12/02/21   Clinical Intake:  Pre-visit preparation completed: No  Pain : No/denies pain     Nutritional Status: BMI 25 -29 Overweight Nutritional Risks: None Diabetes: No  How often do you need to have someone help you when you read instructions, pamphlets, or other written materials from your doctor or pharmacy?: 1 - Never  Diabetic?  No  Interpreter Needed?: No  Information entered by :: Theresa Mulligan LPN   Activities of Daily Living    10/11/2022   10:48 AM  In  your present state of health, do you have any difficulty performing the following activities:  Hearing? 0  Vision? 0  Difficulty concentrating or making decisions? 0  Walking or climbing stairs? 0  Dressing or bathing? 0  Doing errands, shopping? 0  Preparing Food and eating ? N  Using the Toilet? N  In the past six months, have you accidently leaked urine? Y  Comment Wears pads PRN  Do you have problems with loss of bowel control? N  Managing your Medications? N  Managing your Finances? N  Housekeeping or managing your Housekeeping? N    Patient Care Team: Shirline Frees, NP as PCP - General (Family Medicine) Meriam Sprague, MD as PCP - Cardiology (Cardiology) Regan Lemming, MD as PCP - Electrophysiology (Cardiology) Verner Chol, [facility] (Inactive) as Pharmacist (Pharmacist)  Indicate any recent Medical Services you may have received from other than [facility] providers in the past year (date may be approximate).     Assessment:   This is a routine wellness examination for Glenda Bauer.  Hearing/Vision screen Hearing Screening - Comments:: Denies hearing difficulties   Vision Screening - Comments:: - up to date with routine eye exams with  [facility]  Dietary issues and exercise activities discussed: Exercise limited by: None identified   Goals Addressed               This Visit's Progress     Lose weight (pt-stated)        I want to become heart healthy.       Depression Screen    10/11/2022   10:48 AM 10/08/2021   10:03 AM 05/08/2021    9:04 AM 09/29/2020    9:22 AM 09/29/2020    9:12 AM 03/27/2020    8:27 AM 06/22/2017    8:04 AM  PHQ 2/9 Scores  PHQ - 2 Score 0 0 2 0 0 0 0  PHQ- 9 Score   5        Fall Risk    10/11/2022   10:49 AM 10/08/2021  10:07 AM 10/08/2021    9:55 AM 05/08/2021    9:05 AM 09/29/2020    9:21 AM  Fall Risk   Falls in the past year? 0 0 0 0 0  Number falls in past yr: 0 0 0  0  Injury with Fall? 0 0 0  0  Risk for fall  due to : No Fall Risks No Fall Risks     Follow up Falls prevention discussed    Falls evaluation completed    FALL RISK PREVENTION PERTAINING TO THE HOME:  Any stairs in or around the home? Yes  If so, are there any without handrails? No  Home free of loose throw rugs in walkways, pet beds, electrical cords, etc? Yes  Adequate lighting in your home to reduce risk of falls? Yes   ASSISTIVE DEVICES UTILIZED TO PREVENT FALLS:  Life alert? No  Use of a cane, walker or w/c? No  Grab bars in the bathroom? Yes  Shower chair or bench in shower? Yes  Elevated toilet seat or a handicapped toilet? No   TIMED UP AND GO:  Was the test performed? No . Audio Visit   Cognitive Function:    11/14/2014    8:55 AM  MMSE - Mini Mental State Exam  Not completed: Unable to complete        10/11/2022   10:50 AM 10/08/2021   10:09 AM  6CIT Screen  What Year? 0 points 0 points  What month? 0 points 0 points  What time? 0 points 0 points  Count back from 20 0 points 0 points  Months in reverse 0 points 0 points  Repeat phrase 0 points 0 points  Total Score 0 points 0 points    Immunizations Immunization History  Administered Date(s) Administered   Fluad Quad(high Dose 65+) 03/27/2020   Influenza, High Dose Seasonal PF 02/13/2016, 06/21/2018, 02/15/2019   Influenza,inj,Quad PF,6+ Mos 05/21/2014   PFIZER(Purple Top)SARS-COV-2 Vaccination 08/06/2019, 09/04/2019, 04/05/2020   Pneumococcal Conjugate-13 11/07/2014   Pneumococcal Polysaccharide-23 12/24/2015    TDAP status: Due, Education has been provided regarding the importance of this vaccine. Advised may receive this vaccine at local pharmacy or Health Dept. Aware to provide a copy of the vaccination record if obtained from local pharmacy or Health Dept. Verbalized acceptance and understanding.  Flu Vaccine status: Up to date  Pneumococcal vaccine status: Up to date  Covid-19 vaccine status: Completed vaccines  Qualifies for Shingles  Vaccine? Yes   Zostavax completed No   Shingrix Completed?: No.    Education has been provided regarding the importance of this vaccine. Patient has been advised to call insurance company to determine out of pocket expense if they have not yet received this vaccine. Advised may also receive vaccine at local pharmacy or Health Dept. Verbalized acceptance and understanding.  Screening Tests Health Maintenance  Topic Date Due   DTaP/Tdap/Td (1 - Tdap) Never done   MAMMOGRAM  08/30/2021   COVID-19 Vaccine (4 - 2023-24 season) 10/27/2022 (Originally 02/05/2022)   Zoster Vaccines- Shingrix (1 of 2) 01/11/2023 (Originally 01/20/1970)   INFLUENZA VACCINE  01/06/2023   Medicare Annual Wellness (AWV)  10/11/2023   Pneumonia Vaccine 27+ Years old  Completed   DEXA SCAN  Completed   Hepatitis C Screening  Completed   HPV VACCINES  Aged Out   COLONOSCOPY (Pts 45-78yrs Insurance coverage will need to be confirmed)  Discontinued    Health Maintenance  Health Maintenance Due  Topic Date Due   DTaP/Tdap/Td (  1 - Tdap) Never done   MAMMOGRAM  08/30/2021    Colorectal cancer screening: No longer required.   Mammogram status: Ordered 10/11/22. Pt provided with contact info and advised to call to schedule appt.   Bone Density status: Completed 11/26/14. Results reflect: Bone density results: OSTEOPOROSIS. Repeat every   years.  Lung Cancer Screening: (Low Dose CT Chest recommended if Age 81-80 years, 30 pack-year currently smoking OR have quit w/in 15years.) does not qualify.     Additional Screening:  Hepatitis C Screening: does qualify; Completed 02/06/19  Vision Screening: Recommended annual ophthalmology exams for early detection of glaucoma and other disorders of the eye. Is the patient up to date with their annual eye exam?  Yes  Who is the provider or what is the name of the office in which the patient attends annual eye exams? [facility] If pt is not established with a provider, would  they like to be referred to a provider to establish care? No .   Dental Screening: Recommended annual dental exams for proper oral hygiene  Community Resource Referral / Chronic Care Management:  CRR required this visit?  No   CCM required this visit?  No      Plan:     I have personally reviewed and noted the following in the patient's chart:   Medical and social history Use of alcohol, tobacco or illicit drugs  Current medications and supplements including opioid prescriptions. Patient is not currently taking opioid prescriptions. Functional ability and status Nutritional status Physical activity Advanced directives List of other physicians Hospitalizations, surgeries, and ER visits in previous 12 months Vitals Screenings to include cognitive, depression, and falls Referrals and appointments  In addition, I have reviewed and discussed with patient certain preventive protocols, quality metrics, and best practice recommendations. A written personalized care plan for preventive services as well as general preventive health recommendations were provided to patient.     Glenda Rung, LPN   10/09/979   Nurse Notes: None

## 2022-10-12 ENCOUNTER — Other Ambulatory Visit: Payer: Self-pay | Admitting: Adult Health

## 2022-10-20 DIAGNOSIS — G4733 Obstructive sleep apnea (adult) (pediatric): Secondary | ICD-10-CM | POA: Diagnosis not present

## 2022-10-25 ENCOUNTER — Telehealth: Payer: Self-pay | Admitting: Cardiology

## 2022-10-25 ENCOUNTER — Telehealth: Payer: Self-pay

## 2022-10-25 DIAGNOSIS — E785 Hyperlipidemia, unspecified: Secondary | ICD-10-CM

## 2022-10-25 DIAGNOSIS — I2510 Atherosclerotic heart disease of native coronary artery without angina pectoris: Secondary | ICD-10-CM

## 2022-10-25 NOTE — Progress Notes (Signed)
Patient ID: Glenda Bauer, female   DOB: Oct 11, 1950, 72 y.o.   MRN: [id number]  Received call from patient noting that her patient assistance with [employer] for her repatha is expired. She reports she is in need of her medication is currently out would like refills sent to [facility]. Advised her I would give Cardiology a call for her to request the prescription be sent over, also advised her per [facility] that she may qualify with [employer] grant for extra assistance if her copay is still too high for her after the medicare extra help is applied. Patient aware and in agreement to call me if it is still too expensive for her with [facility]. . Pamala Duffel CMA Clinical Pharmacist Assistant [phone]

## 2022-10-25 NOTE — Telephone Encounter (Signed)
*  STAT* If patient is at the pharmacy, call can be transferred to refill team.   1. Which medications need to be refilled? (please list name of each medication and dose if known)   Evolocumab (REPATHA SURECLICK) 140 MG/ML SOAJ   2. Which pharmacy/location (including street and city if local pharmacy) is medication to be sent to?   [facility] - [place], [place] - [place] [id number]   3. Do they need a 30 day or 90 day supply?   30 day  Caller stated patient is completely out of this medication.

## 2022-11-02 ENCOUNTER — Telehealth: Payer: Self-pay | Admitting: Cardiology

## 2022-11-02 ENCOUNTER — Telehealth: Payer: Self-pay

## 2022-11-02 MED ORDER — METOPROLOL SUCCINATE ER 25 MG PO TB24: 25.0000 mg | ORAL_TABLET | Freq: Every day | ORAL | 6 refills | Status: DC

## 2022-11-02 NOTE — Telephone Encounter (Signed)
Pt's medication was sent to pt's pharmacy as requested. Confirmation received.  °

## 2022-11-02 NOTE — Progress Notes (Unsigned)
Patient ID: Glenda Bauer, female   DOB: [birth year], 72 y.o.   MRN: [id number]  Care Management & Coordination Services Pharmacy Team  Reason for Encounter: Medication coordination and delivery  Contacted patient to discuss medications and coordinate delivery from [facility]. {US HC Outreach:28874} Cycle dispensing form sent to *** for review.   Last adherence delivery date:10/14/22       Patient is due for next adherence delivery on: 11/12/22  This delivery to include: Adherence Packaging  30 Days  Ezetimibe 10 mg: Take one tab daily at breakfast Xarelto 20 mg: Take one tab at dinner Montelukast 10 mg :Take one tablet at Bedtime Lisinopril-HCTZ 20-12.5 mg : Take one tablet at Breakfast Metoprolol Succinate 25 mg - Take one tablet at Breakfast Fluoxetine 20 mg : Take one capsule at Breakfast Alprazolam 0.25 mg :Take 1-2 tablets per day as needed Symbicort  Triamcinolone Cream  Patient declined the following medications this month: ***  Refills requested from providers include: Metropolol Succinate 25 mg - Take one tablet at Breakfast Meriam Sprague, MD  [phone]   {Delivery [phone]   Any concerns about your medications? {yes/no:20286}  How often do you forget or accidentally miss a dose? {Missed doses:25554}  Do you use a pillbox? {yes/no:20286}  Is patient in packaging {yes/no:20286}  If yes  What is the date on your next pill pack?  Any concerns or issues with your packaging?   Recent blood pressure readings are as follows:***  Recent blood glucose readings are as follows:***   Chart review: Recent office visits:  10/11/22 [name], LPN - Patient presented via telephone for Medicare Annual Wellness exam and other concerns. Patient voiced goal of weight loss. No medication changes.   Recent consult visits:  None  Hospital visits:  Patient was seen at [facility] on 05/12/2022 (6 hours) due to atrial fibrillation  ablation.   New?Medications Started at [facility] Discharge:?? -No medications started Medication Changes at Hospital Discharge: No medication changes Medications Discontinued at Hospital Discharge: No medications discontinued Medications that remain the same after Hospital Discharge:??  -All other medications will remain the same  Medications: Outpatient Encounter Medications as of 11/02/2022  Medication Sig Note   albuterol (VENTOLIN HFA) 108 (90 Base) MCG/ACT inhaler INHALE TWO PUFFS BY MOUTH INTO LUNGS EVERY 6 HOURS AS NEEDED FOR SHORTNESS OF BREATH OR FOR WHEEZING    ALPRAZolam (XANAX) 0.25 MG tablet TAKE 1 TO 2 TABLETS BY MOUTH ONCE daily AS NEEDED    Evolocumab (REPATHA SURECLICK) 140 MG/ML SOAJ Inject into the skin.    ezetimibe (ZETIA) 10 MG tablet Take 1 tablet (10 mg total) by mouth daily.    famotidine (PEPCID) 20 MG tablet Take 20 mg by mouth daily as needed for indigestion or heartburn.    FLUAD QUADRIVALENT 0.5 ML injection Inject 0.5 mLs into the muscle once.    FLUoxetine (PROZAC) 20 MG capsule TAKE ONE CAPSULE BY MOUTH ONCE DAILY    fluticasone (FLONASE SENSIMIST) 27.5 MCG/SPRAY nasal spray Place 1 spray into the nose in the morning.    levalbuterol (XOPENEX) 0.63 MG/3ML nebulizer solution Take 3 mLs (0.63 mg total) by nebulization every 6 (six) hours as needed for wheezing or shortness of breath.    levocetirizine (XYZAL) 5 MG tablet Take 5 mg by mouth every other day. In the evening.    lisinopril-hydrochlorothiazide (ZESTORETIC) 20-12.5 MG tablet Take 1 tablet by mouth daily.    metoprolol succinate (TOPROL-XL) 25 MG 24 hr tablet Take 1  tablet (25 mg total) by mouth daily. Take with or immediately following a meal.    montelukast (SINGULAIR) 10 MG tablet TAKE ONE TABLET BY MOUTH EVERYDAY AT BEDTIME    Multiple Vitamin (MULTIVITAMIN) tablet Take 1 tablet by mouth every evening.    Multiple Vitamins-Minerals (VISION FORMULA PO) Take 1 capsule by mouth every evening.     Omega-3 Fatty Acids (FISH OIL PO) Take 1 capsule by mouth daily. 05/07/2022: On  hold per patient preference   rivaroxaban (XARELTO) 20 MG TABS tablet Take 1 tablet (20 mg total) by mouth every evening.    SYMBICORT 160-4.5 MCG/ACT inhaler INHALE TWO PUFFS BY MOUTH INTO LUNGS TWICE DAILY    triamcinolone cream (KENALOG) 0.1 % Apply topically 2 (two) times daily.    No facility-administered encounter medications on file as of 11/02/2022.   BP Readings from Last 3 Encounters:  08/11/22 130/82  06/23/22 122/75  06/21/22 138/88    Pulse Readings from Last 3 Encounters:  08/11/22 61  06/23/22 (!) 56  06/21/22 (!) 55    Lab Results  Component Value Date/Time   HGBA1C 5.6 05/08/2021 09:34 AM   Lab Results  Component Value Date   CREATININE 0.90 04/21/2022   BUN 21 04/21/2022   GFR 62.30 05/08/2021   GFRNONAA 59 (L) 05/21/2020   GFRAA 68 05/21/2020   NA 140 04/21/2022   K 4.0 04/21/2022   CALCIUM 9.8 04/21/2022   CO2 25 04/21/2022        Pamala Duffel CMA Clinical Pharmacist Assistant [phone]

## 2022-11-02 NOTE — Telephone Encounter (Signed)
*  STAT* If patient is at the pharmacy, call can be transferred to refill team.   1. Which medications need to be refilled? (please list name of each medication and dose if known)   metoprolol succinate (TOPROL-XL) 25 MG 24 hr tablet   2. Which pharmacy/location (including street and city if local pharmacy) is medication to be sent to?  [facility] - [place], [place] - [place] [id number]   3. Do they need a 30 day or 90 day supply?   30 day  Caller stated patient is almost out of this medication.

## 2022-11-03 ENCOUNTER — Other Ambulatory Visit: Payer: Self-pay | Admitting: Adult Health

## 2022-11-03 ENCOUNTER — Other Ambulatory Visit: Payer: Self-pay

## 2022-11-03 DIAGNOSIS — C84A0 Cutaneous T-cell lymphoma, unspecified, unspecified site: Secondary | ICD-10-CM

## 2022-11-03 DIAGNOSIS — J4541 Moderate persistent asthma with (acute) exacerbation: Secondary | ICD-10-CM

## 2022-11-03 MED ORDER — REPATHA SURECLICK 140 MG/ML ~~LOC~~ SOAJ: 140.0000 mg | SUBCUTANEOUS | 0 refills | Status: DC

## 2022-11-03 MED ORDER — MONTELUKAST SODIUM 10 MG PO TABS: ORAL_TABLET | ORAL | 0 refills | Status: DC

## 2022-11-08 ENCOUNTER — Ambulatory Visit: Payer: Medicare Other

## 2022-11-09 ENCOUNTER — Other Ambulatory Visit: Payer: Self-pay | Admitting: Adult Health

## 2022-11-09 DIAGNOSIS — I10 Essential (primary) hypertension: Secondary | ICD-10-CM

## 2022-11-09 DIAGNOSIS — J4540 Moderate persistent asthma, uncomplicated: Secondary | ICD-10-CM

## 2022-11-09 DIAGNOSIS — Z0000 Encounter for general adult medical examination without abnormal findings: Secondary | ICD-10-CM

## 2022-11-09 DIAGNOSIS — I4891 Unspecified atrial fibrillation: Secondary | ICD-10-CM

## 2022-11-10 NOTE — Telephone Encounter (Signed)
Medication refused. Pt has not been seen in over a year. Tried to call pt but no answer.

## 2022-11-10 NOTE — Telephone Encounter (Signed)
-----   Message from Sherrill Raring, [facility] sent at 11/10/2022  9:16 AM EDT ----- Regarding: Med Refill [facility] requesting refill on behalf of the patient for the following prescription:  Xarelto 20mg  1 tab by mouth daily  Pharmacy Info: [facility] - [place], [place] - [address]  Phone: [phone] Fax: [phone]   Thank you, Sherrill Raring Clinical Pharmacist [phone]

## 2022-11-15 ENCOUNTER — Ambulatory Visit: Payer: Medicare Other | Admitting: Cardiology

## 2022-12-06 ENCOUNTER — Other Ambulatory Visit: Payer: Self-pay | Admitting: Adult Health

## 2022-12-06 ENCOUNTER — Other Ambulatory Visit: Payer: Self-pay | Admitting: Pharmacist

## 2022-12-06 DIAGNOSIS — I4891 Unspecified atrial fibrillation: Secondary | ICD-10-CM

## 2022-12-06 DIAGNOSIS — E785 Hyperlipidemia, unspecified: Secondary | ICD-10-CM

## 2022-12-06 DIAGNOSIS — J4540 Moderate persistent asthma, uncomplicated: Secondary | ICD-10-CM

## 2022-12-06 DIAGNOSIS — I2510 Atherosclerotic heart disease of native coronary artery without angina pectoris: Secondary | ICD-10-CM

## 2022-12-06 DIAGNOSIS — Z0000 Encounter for general adult medical examination without abnormal findings: Secondary | ICD-10-CM

## 2022-12-06 DIAGNOSIS — I10 Essential (primary) hypertension: Secondary | ICD-10-CM

## 2022-12-06 DIAGNOSIS — F419 Anxiety disorder, unspecified: Secondary | ICD-10-CM

## 2022-12-06 MED ORDER — REPATHA SURECLICK 140 MG/ML ~~LOC~~ SOAJ
140.0000 mg | SUBCUTANEOUS | 11 refills | Status: DC
Start: 2022-12-06 — End: 2023-11-25

## 2022-12-13 ENCOUNTER — Other Ambulatory Visit: Payer: Self-pay

## 2022-12-13 DIAGNOSIS — Z0000 Encounter for general adult medical examination without abnormal findings: Secondary | ICD-10-CM

## 2022-12-13 DIAGNOSIS — I10 Essential (primary) hypertension: Secondary | ICD-10-CM

## 2022-12-13 DIAGNOSIS — J4540 Moderate persistent asthma, uncomplicated: Secondary | ICD-10-CM

## 2022-12-13 DIAGNOSIS — I4891 Unspecified atrial fibrillation: Secondary | ICD-10-CM

## 2022-12-13 MED ORDER — RIVAROXABAN 20 MG PO TABS
20.0000 mg | ORAL_TABLET | Freq: Every evening | ORAL | 5 refills | Status: DC
Start: 2022-12-13 — End: 2023-04-25

## 2022-12-13 NOTE — Telephone Encounter (Signed)
Pt is requesting a refill. Pt stated her PCP is not following her Xarelto, she is coming in to see Jari Favre, but will run out before her appointment. Please address. Thank you

## 2022-12-13 NOTE — Telephone Encounter (Signed)
Prescription refill request for Xarelto received.  Indication:afib Last office visit:3/24 Weight:79.8  kg Age:72 Scr:0.90  11/23 CrCl:70.13  ml/min  Prescription refilled

## 2023-01-05 ENCOUNTER — Ambulatory Visit: Payer: Medicare Other | Attending: Cardiology

## 2023-01-05 DIAGNOSIS — E785 Hyperlipidemia, unspecified: Secondary | ICD-10-CM | POA: Diagnosis not present

## 2023-01-09 NOTE — Progress Notes (Unsigned)
Cardiology Office Note:  .   Date:  01/10/2023  ID:  [name], DOB 06-Feb-1951, MRN [id number] PCP: Shirline Frees, NP  [facility] Providers Cardiologist:  Meriam Sprague, MD Electrophysiologist:  Regan Lemming, MD {  History of Present Illness: .   Latitia Housewright is a 72 y.o. female with a past medical history of HTN, cutaneous T-cell lymphoma, ascending aortic aneurysm, and paroxysmal atrial fibrillation who presents to the clinic for follow-up appointment.  Per review of record, the patient was initially diagnosed with atrial fibrillation 03/27/2020 at her routine PCP visit.  EKG showed A-fib heart rate 137.  Patient was started on Xarelto for CHA2DS2-VASc score of 3 and metoprolol for rate control.  Followed by A-fib clinic and was initiated on flecainide with return to normal sinus rhythm.  She was seen in the clinic 12/23 where she was having more dyspnea and fatigue with exertion.  Obtained cardiac CTA 06/15/2021 which showed severe, soft plaque with greater than 70% stenosis of M LAD.  FFR negative.  Was recommended to continue aggressive medical therapy.  Was seen by Dr. Elberta Fortis 8/23 where she was suffering from more frequent palpitations.  Wish to pursue ablation.  Was last seen by Dr. Shari Prows 05/11/2022 and was overall feeling well and plan for ablation the following day.  She was stable from a CV standpoint.  No significant palpitations, chest pain, or SOB.  Occasional dizziness when going from seated to standing position but no falls or syncope.  Endorsed swelling in her right leg which she usually feels towards the end of the day.  She is open to trying compression socks to help with the swelling and avoid varicose veins.  Compliant with Xarelto reporting no missed doses or bleeding.  Adhering to Mediterranean diet.  Denied palpitations, chest pain, shortness of breath, peripheral edema.  No lightheadedness, headaches, syncope, orthopnea, or  PND.  Today, she tells me she has been off prozac pushing through the fatigue. Walking 5 miles a day.  She has been having a tough time losing weight.  She was on the Mediterranean diet and doing well but has since been less consistent.  Otherwise, doing well from a heart standpoint.  Compliant with all medications.  Blood pressure and heart rate are well-controlled today.  She sees Dr. Laneta Simmers for her thoracic aortic aneurysm.  Reports no shortness of breath nor dyspnea on exertion. Reports no chest pain, pressure, or tightness. No edema, orthopnea, PND. Reports no palpitations.    ROS: Pertinent ROS in HPI  Studies Reviewed: .        Echo 12/09/2021: IMPRESSIONS   1. Left ventricular ejection fraction, by estimation, is 60 to 65%. The  left ventricle has normal function. The left ventricle has no regional  wall motion abnormalities. Left ventricular diastolic parameters are  consistent with Grade I diastolic  dysfunction (impaired relaxation).   2. Right ventricular systolic function is normal. The right ventricular  size is normal.   3. The mitral valve is grossly normal. Mild mitral valve regurgitation.  No evidence of mitral stenosis.   4. The aortic valve is tricuspid. There is mild calcification of the  aortic valve. There is mild thickening of the aortic valve. Aortic valve  regurgitation is mild to moderate. Aortic valve sclerosis/calcification is  present, without any evidence of  aortic stenosis.   5. Aortic dilatation noted. There is mild-to-moderate dilatation of the  ascending aorta, measuring 45 mm. There is borderline dilatation of the  aortic  arch, measuring 39 mm.   6. The inferior vena cava is normal in size with greater than 50%  respiratory variability, suggesting right atrial pressure of 3 mmHg.   Comparison(s): Compared to prior TTE on 04/08/2020, the LVEF appears more vigorous and the MR appears less (prior EF 50-55% with mild-to-moderate MR). There continues to  be mild-to-moderate AI.      Cardiac CTR 06/2021: FINDINGS: A 120 kV prospective scan was triggered in the descending thoracic aorta at 111 HU's. Axial non-contrast 3 mm slices were carried out through the heart. The data set was analyzed on a dedicated work station and scored using the Agatson method. Gantry rotation speed was 250 msecs and collimation was .6 mm. No beta blockade and 0.8 mg of sl NTG was given. The 3D data set was reconstructed in 5% intervals of the 67-82 % of the R-R cycle. Diastolic phases were analyzed on a dedicated work station using MPR, MIP and VRT modes. The patient received 80 cc of contrast.   Aorta: Ascending aorta moderately dilated. Aortic atherosclerosis. No dissection.   Aortic Valve:  Trileaflet.  No calcifications.   Coronary Arteries:  Normal coronary origin.  Right dominance.   RCA is a large dominant artery that gives rise to PDA and PLVB. There is no plaque.   Left main is a large artery that gives rise to LAD, and LCX arteries. There is minimal (<25%) calcified plaque.   LAD is a large vessel that has minimal (<25%) mixed plaque proximally. There is severe (>70%) soft plaque in the mid LAD. There are two small diagonal vessels without significant obstructive disease.   LCX is a non-dominant artery that gives rise to two small OM branches. There is no plaque.   Coronary Calcium Score:   Left main: 224   Left anterior descending artery: 319   Left circumflex artery: 0   Right coronary artery: 0   Total: 543   Percentile: 91st   Other findings:   Normal pulmonary vein drainage into the left atrium.   Normal let atrial appendage without a thrombus.   Normal size of the pulmonary artery.   Mild mitral annular calcification.   IMPRESSION: 1. Coronary calcium score of 543. This was 91st percentile for age-, race-, and sex-matched controls.   2. Normal coronary origin with right dominance.   3.  Ascending aorta  aneurysm (4.4cm.)   4. Severe (>70%) soft plaque in the mid LAD. Minimal (<25%) plaque in LM and proximal LAD. CAD-RADS 4.   5.  Will send study for FFRct.   FFR: FINDINGS: FFRct analysis was performed on the original cardiac CT angiogram dataset. Diagrammatic representation of the FFRct analysis is provided in a separate PDF document in PACS. This dictation was created using the PDF document and an interactive 3D model of the results. 3D model is not available in the EMR/PACS. Normal FFR range is >0.80.   1. Left Main: FFRct 1.0   2. LAD: FFRct 0.97 proximally, 0.96 mid, 0.82 distal   3. LCX: FFRct 0.99 proximally, 096 mid   4. RCA: FFRct 0.99 proximally, 0.94 mid, 0.89 distal.   IMPRESSION: 1.  FFRct findings are consistent with non-obstructive disease.   2. Recommend aggressive risk factor modification, including LDL goal <70.   MRA of aorta 11/13/20: FINDINGS: VASCULAR   Aorta: Tortuous thoracic aorta with type 3 arch. No pedunculated plaque. No periaortic fluid. No dissection.   The estimated diameter of the ascending aorta is 45 mm. No significant  atherosclerotic changes.   Heart: Heart size within normal limits.  No pericardial fluid.   Pulmonary Arteries: Flow signal of the pulmonary arteries unremarkable. Main pulmonary artery measures 32 mm.   NON-VASCULAR   Pleura/lungs: No pleural fluid.   Nodular changes at the bilateral lung apices, pleuroparenchymal interface, poorly characterized on MR.   No confluent airspace disease.   Mediastinum: No adenopathy. Unremarkable thoracic inlet. Hiatal hernia.   Musculoskeletal: Relatively uniform signal within the visualized spinal vertebral bodies. No narrowing of the spinal canal.   Unremarkable appearance of the chest wall with no adenopathy.   Upper abdomen: Unremarkable   IMPRESSION: Estimated maximum diameter of the ascending aorta 45 mm. Recommend semi-annual imaging followup by CTA or MRA and  referral to cardiothoracic surgery if not already obtained. This recommendation follows 2010 ACCF/AHA/AATS/ACR/ASA/SCA/SCAI/SIR/STS/SVM Guidelines for the Diagnosis and Management of Patients With Thoracic Aortic Disease. Circulation. 2010; 121: W119-J478. Aortic aneurysm NOS (ICD10-I71.9)   Lung nodularity at the bilateral lung apices, poorly characterized by MR. If MR surveillance of the aorta is elected, suggest at least chest x-ray documentation of what is presumably benign scarring at the lung apices. Otherwise, this could be characterized on surveillance CT angiogram.   TTE 04/08/20: IMPRESSIONS   1. Left ventricular ejection fraction, by estimation, is 50 to 55%. The  left ventricle has low normal function. The left ventricle has no regional  wall motion abnormalities. Left ventricular diastolic parameters are  indeterminate.   2. Right ventricular systolic function is normal. The right ventricular  size is normal. There is normal pulmonary artery systolic pressure.   3. The mitral valve is normal in structure. Mild to moderate mitral valve  regurgitation. No evidence of mitral stenosis.   4. The aortic valve is normal in structure. Aortic valve regurgitation is  mild to moderate. No aortic stenosis is present.   5. Aortic dilatation noted. There is mild to moderate dilatation of the  ascending aorta, measuring 44 mm. Risk Assessment/Calculations:    CHA2DS2-VASc Score = 4   This indicates a 4.8% annual risk of stroke. The patient's score is based upon:          Physical Exam:   VS:  BP 118/70 (BP Location: Left Arm, Patient Position: Sitting, Cuff Size: Large)   Pulse (!) 58   Ht 5\' 6"  (1.676 m)   Wt 176 lb 9.6 oz (80.1 kg)   LMP 06/07/2000 (Approximate)   SpO2 97%   BMI 28.50 kg/m    Wt Readings from Last 3 Encounters:  01/10/23 176 lb 9.6 oz (80.1 kg)  10/11/22 176 lb (79.8 kg)  08/11/22 176 lb (79.8 kg)    GEN: Well nourished, well developed in no acute  distress NECK: No JVD; No carotid bruits CARDIAC: RRR, no murmurs, rubs, gallops RESPIRATORY:  Clear to auscultation without rales, wheezing or rhonchi  ABDOMEN: Soft, non-tender, non-distended EXTREMITIES:  No edema; No deformity   ASSESSMENT AND PLAN: .   1.  Coronary artery disease -No chest pain or shortness of breath -Continue current medication regimen which includes Repatha 140 mg/mL every 14 days, Zetia 10 mg daily, metoprolol succinate 25 mg daily, Xarelto 20 mg daily -She is having coverage issues with Repatha where she goes 3 weeks without the medication.  Will set up an appointment with Margaretmary Dys, Pharm.D.  2.  HLD -Statin intolerance and has been on Repatha and Zetia with good control -Just had lab work done which showed triglycerides 65, HDL 62, LDL 50, all numbers  at goal.   3.  Paroxysmal atrial fibrillation -She is seen at the A-fib clinic and needs to make an appointment -Normal sinus rhythm today, heart rate 58 bpm -She remains compliant on Xarelto  4.  Thoracic aortic aneurysm without rupture -November will be due for CT scan  -She sees Dr. Laneta Simmers for surveillance  5.  HTN -Blood pressure 118/70, well-controlled today -Continue current medications -Discussed a Mediterranean diet, low-sodium      Dispo: She will follow-up in 6 months with Dr. Cristal Deer  Signed, Sharlene Dory, PA-C

## 2023-01-10 ENCOUNTER — Encounter: Payer: Self-pay | Admitting: Physician Assistant

## 2023-01-10 ENCOUNTER — Other Ambulatory Visit: Payer: Self-pay | Admitting: Adult Health

## 2023-01-10 ENCOUNTER — Ambulatory Visit: Payer: Medicare Other | Attending: Cardiology | Admitting: Physician Assistant

## 2023-01-10 ENCOUNTER — Other Ambulatory Visit: Payer: Self-pay

## 2023-01-10 VITALS — BP 118/70 | HR 58 | Ht 66.0 in | Wt 176.6 lb

## 2023-01-10 DIAGNOSIS — Z789 Other specified health status: Secondary | ICD-10-CM | POA: Diagnosis not present

## 2023-01-10 DIAGNOSIS — I2510 Atherosclerotic heart disease of native coronary artery without angina pectoris: Secondary | ICD-10-CM | POA: Diagnosis not present

## 2023-01-10 DIAGNOSIS — I10 Essential (primary) hypertension: Secondary | ICD-10-CM

## 2023-01-10 DIAGNOSIS — E785 Hyperlipidemia, unspecified: Secondary | ICD-10-CM

## 2023-01-10 DIAGNOSIS — I480 Paroxysmal atrial fibrillation: Secondary | ICD-10-CM

## 2023-01-10 DIAGNOSIS — Z79899 Other long term (current) drug therapy: Secondary | ICD-10-CM

## 2023-01-10 DIAGNOSIS — I712 Thoracic aortic aneurysm, without rupture, unspecified: Secondary | ICD-10-CM | POA: Diagnosis not present

## 2023-01-10 MED ORDER — EZETIMIBE 10 MG PO TABS: 10.0000 mg | ORAL_TABLET | Freq: Every day | ORAL | 3 refills | Status: DC

## 2023-01-10 NOTE — Patient Instructions (Signed)
Medication Instructions:   Your physician recommends that you continue on your current medications as directed. Please refer to the Current Medication list given to you today.   *If you need a refill on your cardiac medications before your next appointment, please call your pharmacy*   Lab Work: NONE ORDERED  TODAY    If you have labs (blood work) drawn today and your tests are completely normal, you will receive your results only by: MyChart Message (if you have MyChart) OR A paper copy in the mail If you have any lab test that is abnormal or we need to change your treatment, we will call you to review the results.   Testing/Procedures: NONE ORDERED  TODAY    Follow-Up: At [facility], you and your health needs are our priority.  As part of our continuing mission to provide you with exceptional heart care, we have created designated Provider Care Teams.  These Care Teams include your primary Cardiologist (physician) and Advanced Practice Providers (APPs -  Physician Assistants and Nurse Practitioners) who all work together to provide you with the care you need, when you need it.  We recommend signing up for the patient portal called "MyChart".  Sign up information is provided on this After Visit Summary.  MyChart is used to connect with patients for Virtual Visits (Telemedicine).  Patients are able to view lab/test results, encounter notes, upcoming appointments, etc.  Non-urgent messages can be sent to your provider as well.   To learn more about what you can do with MyChart, go to [email or web address].    Your next appointment:  with Pharm- D in 2 weeks Repatha education and coverage  6 month(s)  Provider:   Jodelle Red, MD    Other Instructions:  Heart-Healthy Eating Plan Eating a healthy diet is important for the health of your heart. A heart-healthy eating plan includes: Eating less unhealthy fats. Eating more healthy fats. Eating less salt in your  food. Salt is also called sodium. Making other changes in your diet. Talk with your doctor or a diet specialist (dietitian) to create an eating plan that is right for you. What is my plan? Your doctor may recommend an eating plan that includes: Total fat: ______% or less of total calories a day. Saturated fat: ______% or less of total calories a day. Cholesterol: less than _________mg a day. Sodium: less than _________mg a day. What are tips for following this plan? Cooking Avoid frying your food. Try to bake, boil, grill, or broil it instead. You can also reduce fat by: Removing the skin from poultry. Removing all visible fats from meats. Steaming vegetables in water or broth. Meal planning  At meals, divide your plate into four equal parts: Fill one-half of your plate with vegetables and green salads. Fill one-fourth of your plate with whole grains. Fill one-fourth of your plate with lean protein foods. Eat 2-4 cups of vegetables per day. One cup of vegetables is: 1 cup (91 g) broccoli or cauliflower florets. 2 medium carrots. 1 large bell pepper. 1 large sweet potato. 1 large tomato. 1 medium white potato. 2 cups (150 g) raw leafy greens. Eat 1-2 cups of fruit per day. One cup of fruit is: 1 small apple 1 large banana 1 cup (237 g) mixed fruit, 1 large orange,  cup (82 g) dried fruit, 1 cup (240 mL) 100% fruit juice. Eat more foods that have soluble fiber. These are apples, broccoli, carrots, beans, peas, and barley. Try to get 20-30  g of fiber per day. Eat 4-5 servings of nuts, legumes, and seeds per week: 1 serving of dried beans or legumes equals  cup (90 g) cooked. 1 serving of nuts is  oz (12 almonds, 24 pistachios, or 7 walnut halves). 1 serving of seeds equals  oz (8 g). General information Eat more home-cooked food. Eat less restaurant, buffet, and fast food. Limit or avoid alcohol. Limit foods that are high in starch and sugar. Avoid fried foods. Lose  weight if you are overweight. Keep track of how much salt (sodium) you eat. This is important if you have high blood pressure. Ask your doctor to tell you more about this. Try to add vegetarian meals each week. Fats Choose healthy fats. These include olive oil and canola oil, flaxseeds, walnuts, almonds, and seeds. Eat more omega-3 fats. These include salmon, mackerel, sardines, tuna, flaxseed oil, and ground flaxseeds. Try to eat fish at least 2 times each week. Check food labels. Avoid foods with trans fats or high amounts of saturated fat. Limit saturated fats. These are often found in animal products, such as meats, butter, and cream. These are also found in plant foods, such as palm oil, palm kernel oil, and coconut oil. Avoid foods with partially hydrogenated oils in them. These have trans fats. Examples are stick margarine, some tub margarines, cookies, crackers, and other baked goods. What foods should I eat? Fruits All fresh, canned (in natural juice), or frozen fruits. Vegetables Fresh or frozen vegetables (raw, steamed, roasted, or grilled). Green salads. Grains Most grains. Choose whole wheat and whole grains most of the time. Rice and pasta, including brown rice and pastas made with whole wheat. Meats and other proteins Lean, well-trimmed beef, veal, pork, and lamb. Chicken and [place] without skin. All fish and shellfish. Wild duck, rabbit, pheasant, and venison. Egg whites or low-cholesterol egg substitutes. Dried beans, peas, lentils, and tofu. Seeds and most nuts. Dairy Low-fat or nonfat cheeses, including ricotta and mozzarella. Skim or 1% milk that is liquid, powdered, or evaporated. Buttermilk that is made with low-fat milk. Nonfat or low-fat yogurt. Fats and oils Non-hydrogenated (trans-free) margarines. Vegetable oils, including soybean, sesame, sunflower, olive, peanut, safflower, corn, canola, and cottonseed. Salad dressings or mayonnaise made with a vegetable  oil. Beverages Mineral water. Coffee and tea. Diet carbonated beverages. Sweets and desserts Sherbet, gelatin, and fruit ice. Small amounts of dark chocolate. Limit all sweets and desserts. Seasonings and condiments All seasonings and condiments. The items listed above may not be a complete list of foods and drinks you can eat. Contact a dietitian for more options. What foods should I avoid? Fruits Canned fruit in heavy syrup. Fruit in cream or butter sauce. Fried fruit. Limit coconut. Vegetables Vegetables cooked in cheese, cream, or butter sauce. Fried vegetables. Grains Breads that are made with saturated or trans fats, oils, or whole milk. Croissants. Sweet rolls. Donuts. High-fat crackers, such as cheese crackers. Meats and other proteins Fatty meats, such as hot dogs, ribs, sausage, bacon, rib-eye roast or steak. High-fat deli meats, such as salami and bologna. Caviar. Domestic duck and goose. Organ meats, such as liver. Dairy Cream, sour cream, cream cheese, and creamed cottage cheese. Whole-milk cheeses. Whole or 2% milk that is liquid, evaporated, or condensed. Whole buttermilk. Cream sauce or high-fat cheese sauce. Yogurt that is made from whole milk. Fats and oils Meat fat, or shortening. Cocoa butter, hydrogenated oils, palm oil, coconut oil, palm kernel oil. Solid fats and shortenings, including bacon fat, salt pork,  lard, and butter. Nondairy cream substitutes. Salad dressings with cheese or sour cream. Beverages Regular sodas and juice drinks with added sugar. Sweets and desserts Frosting. Pudding. Cookies. Cakes. Pies. Milk chocolate or white chocolate. Buttered syrups. Full-fat ice cream or ice cream drinks. The items listed above may not be a complete list of foods and drinks to avoid. Contact a dietitian for more information. Summary Heart-healthy meal planning includes eating less unhealthy fats, eating more healthy fats, and making other changes in your diet. Eat a  balanced diet. This includes fruits and vegetables, low-fat or nonfat dairy, lean protein, nuts and legumes, whole grains, and heart-healthy oils and fats. This information is not intended to replace advice given to you by your health care provider. Make sure you discuss any questions you have with your health care provider. Document Revised: 06/29/2021 Document Reviewed: 06/29/2021 Elsevier Patient Education  2024 Elsevier Inc.  Low-Sodium Eating Plan Salt (sodium) helps you keep a healthy balance of fluids in your body. Too much sodium can raise your blood pressure. It can also cause fluid and waste to be held in your body. Your health care provider or dietitian may recommend a low-sodium eating plan if you have high blood pressure (hypertension), kidney disease, liver disease, or heart failure. Eating less sodium can help lower your blood pressure and reduce swelling. It can also protect your heart, liver, and kidneys. What are tips for following this plan? Reading food labels  Check food labels for the amount of sodium per serving. If you eat more than one serving, you must multiply the listed amount by the number of servings. Choose foods with less than 140 milligrams (mg) of sodium per serving. Avoid foods with 300 mg of sodium or more per serving. Always check how much sodium is in a product, even if the label says "unsalted" or "no salt added." Shopping  Buy products labeled as "low-sodium" or "no salt added." Buy fresh foods. Avoid canned foods and pre-made or frozen meals. Avoid canned, cured, or processed meats. Buy breads that have less than 80 mg of sodium per slice. Cooking  Eat more home-cooked food. Try to eat less restaurant, buffet, and fast food. Try not to add salt when you cook. Use salt-free seasonings or herbs instead of table salt or sea salt. Check with your provider or pharmacist before using salt substitutes. Cook with plant-based oils, such as canola, sunflower,  or olive oil. Meal planning When eating at a restaurant, ask if your food can be made with less salt or no salt. Avoid dishes labeled as brined, pickled, cured, or smoked. Avoid dishes made with soy sauce, miso, or teriyaki sauce. Avoid foods that have monosodium glutamate (MSG) in them. MSG may be added to some restaurant food, sauces, soups, bouillon, and canned foods. Make meals that can be grilled, baked, poached, roasted, or steamed. These are often made with less sodium. General information Try to limit your sodium intake to 1,500-2,300 mg each day, or the amount told by your provider. What foods should I eat? Fruits Fresh, frozen, or canned fruit. Fruit juice. Vegetables Fresh or frozen vegetables. "No salt added" canned vegetables. "No salt added" tomato sauce and paste. Low-sodium or reduced-sodium tomato and vegetable juice. Grains Low-sodium cereals, such as oats, puffed wheat and rice, and shredded wheat. Low-sodium crackers. Unsalted rice. Unsalted pasta. Low-sodium bread. Whole grain breads and whole grain pasta. Meats and other proteins Fresh or frozen meat, poultry, seafood, and fish. These should have no added salt.  Low-sodium canned tuna and salmon. Unsalted nuts. Dried peas, beans, and lentils without added salt. Unsalted canned beans. Eggs. Unsalted nut butters. Dairy Milk. Soy milk. Cheese that is naturally low in sodium, such as ricotta cheese, fresh mozzarella, or Swiss cheese. Low-sodium or reduced-sodium cheese. Cream cheese. Yogurt. Seasonings and condiments Fresh and dried herbs and spices. Salt-free seasonings. Low-sodium mustard and ketchup. Sodium-free salad dressing. Sodium-free light mayonnaise. Fresh or refrigerated horseradish. Lemon juice. Vinegar. Other foods Homemade, reduced-sodium, or low-sodium soups. Unsalted popcorn and pretzels. Low-salt or salt-free chips. The items listed above may not be all the foods and drinks you can have. Talk to a dietitian to  learn more. What foods should I avoid? Vegetables Sauerkraut, pickled vegetables, and relishes. Olives. [place] fries. Onion rings. Regular canned vegetables, except low-sodium or reduced-sodium items. Regular canned tomato sauce and paste. Regular tomato and vegetable juice. Frozen vegetables in sauces. Grains Instant hot cereals. Bread stuffing, pancake, and biscuit mixes. Croutons. Seasoned rice or pasta mixes. Noodle soup cups. Boxed or frozen macaroni and cheese. Regular salted crackers. Self-rising flour. Meats and other proteins Meat or fish that is salted, canned, smoked, spiced, or pickled. Precooked or cured meat, such as sausages or meat loaves. Tomasa Blase. Ham. Pepperoni. Hot dogs. Corned beef. Chipped beef. Salt pork. Jerky. Pickled herring, anchovies, and sardines. Regular canned tuna. Salted nuts. Dairy Processed cheese and cheese spreads. Hard cheeses. Cheese curds. Blue cheese. Feta cheese. String cheese. Regular cottage cheese. Buttermilk. Canned milk. Fats and oils Salted butter. Regular margarine. Ghee. Bacon fat. Seasonings and condiments Onion salt, garlic salt, seasoned salt, table salt, and sea salt. Canned and packaged gravies. Worcestershire sauce. Tartar sauce. Barbecue sauce. Teriyaki sauce. Soy sauce, including reduced-sodium soy sauce. Steak sauce. Fish sauce. Oyster sauce. Cocktail sauce. Horseradish that you find on the shelf. Regular ketchup and mustard. Meat flavorings and tenderizers. Bouillon cubes. Hot sauce. Pre-made or packaged marinades. Pre-made or packaged taco seasonings. Relishes. Regular salad dressings. Salsa. Other foods Salted popcorn and pretzels. Corn chips and puffs. Potato and tortilla chips. Canned or dried soups. Pizza. Frozen entrees and pot pies. The items listed above may not be all the foods and drinks you should avoid. Talk to a dietitian to learn more. This information is not intended to replace advice given to you by your health care provider.  Make sure you discuss any questions you have with your health care provider. Document Revised: 06/10/2022 Document Reviewed: 06/10/2022 Elsevier Patient Education  2024 [employer].

## 2023-01-11 NOTE — Telephone Encounter (Signed)
Left message on machine for patient to schedule an appointment for refills

## 2023-01-18 DIAGNOSIS — G4733 Obstructive sleep apnea (adult) (pediatric): Secondary | ICD-10-CM | POA: Diagnosis not present

## 2023-01-27 ENCOUNTER — Ambulatory Visit: Payer: Medicare Other

## 2023-02-05 ENCOUNTER — Other Ambulatory Visit: Payer: Self-pay | Admitting: Adult Health

## 2023-02-05 DIAGNOSIS — F419 Anxiety disorder, unspecified: Secondary | ICD-10-CM

## 2023-02-05 DIAGNOSIS — J4541 Moderate persistent asthma with (acute) exacerbation: Secondary | ICD-10-CM

## 2023-02-08 NOTE — Telephone Encounter (Signed)
Okay for refill?  

## 2023-02-23 ENCOUNTER — Encounter: Payer: Self-pay | Admitting: Adult Health

## 2023-02-23 ENCOUNTER — Ambulatory Visit (INDEPENDENT_AMBULATORY_CARE_PROVIDER_SITE_OTHER): Payer: Medicare Other | Admitting: Adult Health

## 2023-02-23 ENCOUNTER — Other Ambulatory Visit: Payer: Self-pay | Admitting: Adult Health

## 2023-02-23 VITALS — BP 120/82 | HR 65 | Temp 98.0°F | Ht 66.54 in | Wt 180.4 lb

## 2023-02-23 DIAGNOSIS — J4540 Moderate persistent asthma, uncomplicated: Secondary | ICD-10-CM

## 2023-02-23 DIAGNOSIS — Z0000 Encounter for general adult medical examination without abnormal findings: Secondary | ICD-10-CM | POA: Diagnosis not present

## 2023-02-23 DIAGNOSIS — I480 Paroxysmal atrial fibrillation: Secondary | ICD-10-CM | POA: Diagnosis not present

## 2023-02-23 DIAGNOSIS — T466X5A Adverse effect of antihyperlipidemic and antiarteriosclerotic drugs, initial encounter: Secondary | ICD-10-CM

## 2023-02-23 DIAGNOSIS — G720 Drug-induced myopathy: Secondary | ICD-10-CM | POA: Diagnosis not present

## 2023-02-23 DIAGNOSIS — F32A Depression, unspecified: Secondary | ICD-10-CM

## 2023-02-23 DIAGNOSIS — I10 Essential (primary) hypertension: Secondary | ICD-10-CM

## 2023-02-23 DIAGNOSIS — M797 Fibromyalgia: Secondary | ICD-10-CM

## 2023-02-23 DIAGNOSIS — F419 Anxiety disorder, unspecified: Secondary | ICD-10-CM | POA: Diagnosis not present

## 2023-02-23 DIAGNOSIS — C84A0 Cutaneous T-cell lymphoma, unspecified, unspecified site: Secondary | ICD-10-CM

## 2023-02-23 DIAGNOSIS — E785 Hyperlipidemia, unspecified: Secondary | ICD-10-CM | POA: Diagnosis not present

## 2023-02-23 LAB — CBC WITH DIFFERENTIAL/PLATELET
Basophils Absolute: 0.1 10*3/uL (ref 0.0–0.1)
Basophils Relative: 1.2 % (ref 0.0–3.0)
Eosinophils Absolute: 0.4 10*3/uL (ref 0.0–0.7)
Eosinophils Relative: 6.5 % — ABNORMAL HIGH (ref 0.0–5.0)
HCT: 42.5 % (ref 36.0–46.0)
Hemoglobin: 14 g/dL (ref 12.0–15.0)
Lymphocytes Relative: 32.6 % (ref 12.0–46.0)
Lymphs Abs: 1.8 10*3/uL (ref 0.7–4.0)
MCHC: 33 g/dL (ref 30.0–36.0)
MCV: 89.5 fl (ref 78.0–100.0)
Monocytes Absolute: 0.4 10*3/uL (ref 0.1–1.0)
Monocytes Relative: 7 % (ref 3.0–12.0)
Neutro Abs: 2.8 10*3/uL (ref 1.4–7.7)
Neutrophils Relative %: 52.7 % (ref 43.0–77.0)
Platelets: 259 10*3/uL (ref 150.0–400.0)
RBC: 4.75 Mil/uL (ref 3.87–5.11)
RDW: 13.4 % (ref 11.5–15.5)
WBC: 5.4 10*3/uL (ref 4.0–10.5)

## 2023-02-23 LAB — COMPREHENSIVE METABOLIC PANEL WITH GFR
ALT: 22 U/L (ref 0–35)
AST: 19 U/L (ref 0–37)
Albumin: 3.9 g/dL (ref 3.5–5.2)
Alkaline Phosphatase: 62 U/L (ref 39–117)
BUN: 19 mg/dL (ref 6–23)
CO2: 28 meq/L (ref 19–32)
Calcium: 9.2 mg/dL (ref 8.4–10.5)
Chloride: 106 meq/L (ref 96–112)
Creatinine, Ser: 0.88 mg/dL (ref 0.40–1.20)
GFR: 64.89 mL/min (ref >60.00)
Glucose, Bld: 93 mg/dL (ref 70–99)
Potassium: 3.8 meq/L (ref 3.5–5.1)
Sodium: 142 meq/L (ref 135–145)
Total Bilirubin: 0.5 mg/dL (ref 0.2–1.2)
Total Protein: 7.1 g/dL (ref 6.0–8.3)

## 2023-02-23 LAB — TSH: TSH: 1.09 u[IU]/mL (ref 0.35–5.50)

## 2023-02-23 MED ORDER — FLUOXETINE HCL 40 MG PO CAPS
40.0000 mg | ORAL_CAPSULE | Freq: Every day | ORAL | 3 refills | Status: DC
Start: 2023-02-23 — End: 2024-01-25

## 2023-02-23 NOTE — Patient Instructions (Signed)
It was great seeing you today   We will follow up with you regarding your lab work   Please let me know if you need anything

## 2023-02-24 ENCOUNTER — Ambulatory Visit: Payer: Medicare Other

## 2023-02-24 ENCOUNTER — Encounter: Payer: Self-pay | Admitting: Adult Health

## 2023-02-24 ENCOUNTER — Ambulatory Visit (INDEPENDENT_AMBULATORY_CARE_PROVIDER_SITE_OTHER): Payer: Medicare Other | Admitting: Adult Health

## 2023-02-24 VITALS — BP 110/80 | HR 60 | Temp 97.9°F | Ht 66.5 in | Wt 180.0 lb

## 2023-02-24 DIAGNOSIS — M19041 Primary osteoarthritis, right hand: Secondary | ICD-10-CM | POA: Diagnosis not present

## 2023-02-24 DIAGNOSIS — M79641 Pain in right hand: Secondary | ICD-10-CM | POA: Diagnosis not present

## 2023-02-24 NOTE — Progress Notes (Signed)
Subjective:    Patient ID: Glenda Bauer, female    DOB: August 31, 1950, 72 y.o.   MRN: [id number]  HPI 72 year old female who  has a past medical history of Allergy, Anxiety, Asthma, Atrial fibrillation (HCC), Cancer (HCC), Cataract, Cutaneous T-cell lymphoma (HCC) (2011), Depression, GERD (gastroesophageal reflux disease), Heart murmur, Hyperlipidemia, Hypertension, Neuromuscular disorder (HCC), Osteoarthritis, Sebaceous cyst (2022), Thyroid nodule, and Urinary tract infection.  She presents to the office today for an acute issue of pain in her right hand.  She believes symptoms started around May 2024.  She denies any trauma but feels as though the symptoms happened after she was getting pulled walking her dog with the leash.  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  He has some discomfort along the lateral aspect of her right pinky finger and her right pinky finger is constantly in the state of abduction.  She will often have to tape her finger to her ring finger.  She also has adduction of her right thumb with some intermittent contractures towards the palmar surface.  She has loss of grip strength in her right hand and is unable to make a fist.  Denies any palmar pain.  Has not had any numbness or tingling in her hands or forearm.   Review of Systems See HPI   Past Medical History:  Diagnosis Date   Allergy    Anxiety    Asthma    Atrial fibrillation (HCC)    Cancer (HCC)    Melanoma and Mycosis Fungosis   Cataract    Cutaneous T-cell lymphoma (HCC) 2011   Dermatologist treated in [place]; Dr. Maggie Schwalbe   Depression    GERD (gastroesophageal reflux disease)    Heart murmur    Hyperlipidemia    Hypertension    Neuromuscular disorder (HCC)    Osteoarthritis    Sebaceous cyst 2022   right vulva   Thyroid nodule    Urinary tract infection     Social History   Socioeconomic History   Marital status: Divorced     Spouse name: Not on file   Number of children: 3   Years of education: 16   Highest education level: Some college, no degree  Occupational History   Occupation: Warehouse manager   Tobacco Use   Smoking status: Former    Current packs/day: 0.00    Average packs/day: 0.3 packs/day for 10.0 years (2.5 ttl pk-yrs)    Types: Cigarettes    Start date: 06/08/1971    Quit date: 06/07/1981    Years since quitting: 41.7    Passive exposure: Past   Smokeless tobacco: Never   Tobacco comments:    Former smoker 12/02/21  Vaping Use   Vaping status: Never Used  Substance and Sexual Activity   Alcohol use: No    Alcohol/week: 0.0 standard drinks of alcohol   Drug use: No   Sexual activity: Not Currently    Birth control/protection: Post-menopausal  Other Topics Concern   Not on file  Social History Narrative   Separated    Three children ( one lives locally)    Social Determinants of Health   Financial Resource Strain: Medium Risk (02/21/2023)   Overall Financial Resource Strain (CARDIA)    Difficulty of Paying Living Expenses: Somewhat hard  Food Insecurity: No Food Insecurity (02/21/2023)   Hunger Vital Sign    Worried About Running Out of Food in the Last Year: Never  true    Ran Out of Food in the Last Year: Never true  Transportation Needs: No Transportation Needs (02/21/2023)   PRAPARE - Administrator, Civil Service (Medical): No    Lack of Transportation (Non-Medical): No  Physical Activity: Unknown (02/21/2023)   Exercise Vital Sign    Days of Exercise per Week: Patient declined    Minutes of Exercise per Session: 60 min  Stress: Stress Concern Present (02/21/2023)   [employer] of Occupational Health - Occupational Stress Questionnaire    Feeling of Stress : Rather much  Social Connections: Moderately Isolated (02/21/2023)   Social Connection and Isolation Panel [NHANES]    Frequency of Communication with Friends and Family: More than three times a week    Frequency of  Social Gatherings with Friends and Family: Twice a week    Attends Religious Services: Never    Database administrator or Organizations: No    Attends Engineer, structural: More than 4 times per year    Marital Status: Divorced  Intimate Partner Violence: Not At Risk (10/11/2022)   Humiliation, Afraid, Rape, and Kick questionnaire    Fear of Current or Ex-Partner: No    Emotionally Abused: No    Physically Abused: No    Sexually Abused: No    Past Surgical History:  Procedure Laterality Date   ATRIAL FIBRILLATION ABLATION N/A 05/12/2022   Procedure: ATRIAL FIBRILLATION ABLATION;  Surgeon: Regan Lemming, MD;  Location: [facility];  Service: Cardiovascular;  Laterality: N/A;   Cataracts Bilateral    CHOLECYSTECTOMY  2015   melenoma removal  1979   Sinus Polyps       Family History  Problem Relation Age of Onset   Arthritis Mother    Ovarian cancer Mother        Metastatic   Breast cancer Mother 32   Thyroid disease Mother    Parkinson's disease Father    Crohn's disease Brother    Schizophrenia Brother    Colon cancer Neg Hx    Esophageal cancer Neg Hx    Rectal cancer Neg Hx    Stomach cancer Neg Hx     No Known Allergies  Current Outpatient Medications on File Prior to Visit  Medication Sig Dispense Refill   albuterol (VENTOLIN HFA) 108 (90 Base) MCG/ACT inhaler INHALE TWO (2) PUFFS BY MOUTH INTO LUNGS EVERY 6 HOURS AS NEEDED FOR WHEEZING AND/OR FOR SHORTNESS OF BREATH *REFILL REQUEST* 8.5 g 0   ALPRAZolam (XANAX) 0.25 MG tablet TAKE 1-2 TABLETS BY MOUTH ONCE DAILY AS NEEDED *REFILL REQUEST* 30 tablet 0   budesonide-formoterol (SYMBICORT) 160-4.5 MCG/ACT inhaler INHALE TWO (2) PUFFS BY MOUTH INTO LUNGS TWICE DAILY *REFILL REQUEST* 10.2 g 0   Evolocumab (REPATHA SURECLICK) 140 MG/ML SOAJ Inject 140 mg into the skin every 14 (fourteen) days. 2 mL 11   ezetimibe (ZETIA) 10 MG tablet Take 1 tablet (10 mg total) by mouth daily. 90 tablet 3   famotidine  (PEPCID) 20 MG tablet Take 20 mg by mouth daily as needed for indigestion or heartburn.     FLUAD QUADRIVALENT 0.5 ML injection Inject 0.5 mLs into the muscle once.     FLUoxetine (PROZAC) 40 MG capsule Take 1 capsule (40 mg total) by mouth daily. 90 capsule 3   fluticasone (FLONASE SENSIMIST) 27.5 MCG/SPRAY nasal spray Place 1 spray into the nose in the morning.     levalbuterol (XOPENEX) 0.63 MG/3ML nebulizer solution Take 3 mLs (0.63  mg total) by nebulization every 6 (six) hours as needed for wheezing or shortness of breath. 225 mL 6   levocetirizine (XYZAL) 5 MG tablet Take 5 mg by mouth every other day. In the evening.     lisinopril-hydrochlorothiazide (ZESTORETIC) 20-12.5 MG tablet Take 1 tablet by mouth daily. 90 tablet 3   metoprolol succinate (TOPROL-XL) 25 MG 24 hr tablet Take 1 tablet (25 mg total) by mouth daily. Take with or immediately following a meal. 30 tablet 6   montelukast (SINGULAIR) 10 MG tablet TAKE 1 TABLET BY MOUTH EVERY DAY AT BEDTIME *PATIENT NEEDS APPOINTMENT FOR FURTHER REFILLS* *REFILL REQUEST* 90 tablet 0   Multiple Vitamin (MULTIVITAMIN) tablet Take 1 tablet by mouth every evening.     Multiple Vitamins-Minerals (VISION FORMULA PO) Take 1 capsule by mouth every evening.     Omega-3 Fatty Acids (FISH OIL PO) Take 1 capsule by mouth daily.     rivaroxaban (XARELTO) 20 MG TABS tablet Take 1 tablet (20 mg total) by mouth every evening. 30 tablet 5   triamcinolone cream (KENALOG) 0.1 % Apply TOPICALLY twice daily 454 g 3   No current facility-administered medications on file prior to visit.    BP 110/80   Pulse 60   Temp 97.9 F (36.6 C) (Oral)   Ht 5' 6.5" (1.689 m)   Wt 180 lb (81.6 kg)   LMP 06/07/2000 (Approximate)   SpO2 97%   BMI 28.62 kg/m       Objective:   Physical Exam Vitals and nursing note reviewed.  Constitutional:      Appearance: Normal appearance.  Musculoskeletal:     Right hand: Bony tenderness present. Decreased strength of finger  abduction.     Comments: Abduction of right pinky finger in adduction of her right thumb.  She does have 3 out of 5 grip strength in her right hand.  Skin:    General: Skin is warm and dry.  Neurological:     General: No focal deficit present.     Mental Status: She is alert and oriented to person, place, and time.  Psychiatric:        Mood and Affect: Mood normal.        Behavior: Behavior normal.        Thought Content: Thought content normal.        Judgment: Judgment normal.        Assessment & Plan:  1. Hand pain, right -Will do x-ray of right hand today but I am wondering if she has some ligament or tendon damage.  Consider MRI in the future and/or referral to hand specialist - DG Hand Complete Right; Future  Shirline Frees, NP

## 2023-03-01 ENCOUNTER — Other Ambulatory Visit: Payer: Self-pay | Admitting: Adult Health

## 2023-03-01 DIAGNOSIS — F32A Depression, unspecified: Secondary | ICD-10-CM

## 2023-03-03 NOTE — Telephone Encounter (Signed)
Okay for refill?  

## 2023-03-11 ENCOUNTER — Telehealth: Payer: Self-pay | Admitting: Adult Health

## 2023-03-11 NOTE — Telephone Encounter (Signed)
Pt is calling and had hand xray done on 02-24-2023 and would like results

## 2023-03-11 NOTE — Telephone Encounter (Signed)
Patient notified of update

## 2023-03-11 NOTE — Telephone Encounter (Signed)
Please advise 

## 2023-03-16 ENCOUNTER — Other Ambulatory Visit: Payer: Self-pay

## 2023-03-16 DIAGNOSIS — M79641 Pain in right hand: Secondary | ICD-10-CM

## 2023-03-25 ENCOUNTER — Other Ambulatory Visit: Payer: Self-pay

## 2023-03-25 MED ORDER — LISINOPRIL-HYDROCHLOROTHIAZIDE 20-12.5 MG PO TABS: 1.0000 | ORAL_TABLET | Freq: Every day | ORAL | 2 refills | Status: DC

## 2023-04-22 ENCOUNTER — Other Ambulatory Visit: Payer: Self-pay | Admitting: Cardiology

## 2023-04-22 ENCOUNTER — Other Ambulatory Visit: Payer: Self-pay | Admitting: Adult Health

## 2023-04-22 DIAGNOSIS — J4540 Moderate persistent asthma, uncomplicated: Secondary | ICD-10-CM

## 2023-04-22 DIAGNOSIS — J4541 Moderate persistent asthma with (acute) exacerbation: Secondary | ICD-10-CM

## 2023-04-22 DIAGNOSIS — I10 Essential (primary) hypertension: Secondary | ICD-10-CM

## 2023-04-22 DIAGNOSIS — Z0000 Encounter for general adult medical examination without abnormal findings: Secondary | ICD-10-CM

## 2023-04-22 DIAGNOSIS — I4891 Unspecified atrial fibrillation: Secondary | ICD-10-CM

## 2023-04-25 ENCOUNTER — Other Ambulatory Visit: Payer: Self-pay | Admitting: Physician Assistant

## 2023-04-25 MED ORDER — METOPROLOL SUCCINATE ER 25 MG PO TB24: 25.0000 mg | ORAL_TABLET | Freq: Every day | ORAL | 2 refills | Status: DC

## 2023-04-25 NOTE — Telephone Encounter (Signed)
Prescription refill request for Xarelto received.  Indication:afib Last office visit:8/24 Weight:81.6  kg Age:72 Scr:0.88  9/24 CrCl:72.25  ml/min  Prescription refilled

## 2023-05-16 ENCOUNTER — Other Ambulatory Visit: Payer: Self-pay | Admitting: Surgery

## 2023-05-16 DIAGNOSIS — I7121 Aneurysm of the ascending aorta, without rupture: Secondary | ICD-10-CM

## 2023-05-24 ENCOUNTER — Other Ambulatory Visit: Payer: Self-pay | Admitting: Adult Health

## 2023-05-24 DIAGNOSIS — F419 Anxiety disorder, unspecified: Secondary | ICD-10-CM

## 2023-05-25 NOTE — Telephone Encounter (Signed)
Okay for refill?  

## 2023-06-24 ENCOUNTER — Ambulatory Visit
Admission: RE | Admit: 2023-06-24 | Discharge: 2023-06-24 | Disposition: A | Discharge status: Home / Self Care | Payer: Medicare Other | Source: Ambulatory Visit | Attending: Surgery | Admitting: Surgery

## 2023-06-24 DIAGNOSIS — I7121 Aneurysm of the ascending aorta, without rupture: Secondary | ICD-10-CM

## 2023-06-24 DIAGNOSIS — K449 Diaphragmatic hernia without obstruction or gangrene: Secondary | ICD-10-CM | POA: Diagnosis not present

## 2023-06-24 MED ORDER — GADOPICLENOL 0.5 MMOL/ML IV SOLN
9.0000 mL | Freq: Once | INTRAVENOUS | Status: AC | PRN
Start: 2023-06-24 — End: 2023-06-24
  Administered 2023-06-24: 9 mL via INTRAVENOUS

## 2023-06-29 ENCOUNTER — Ambulatory Visit: Payer: Medicare Other | Admitting: Surgery

## 2023-07-13 ENCOUNTER — Ambulatory Visit: Payer: Medicare Other | Admitting: Surgical

## 2023-07-13 VITALS — BP 164/84 | HR 60 | Resp 18 | Wt 181.0 lb

## 2023-07-13 DIAGNOSIS — I7121 Aneurysm of the ascending aorta, without rupture: Secondary | ICD-10-CM

## 2023-07-13 NOTE — Patient Instructions (Signed)
 Reminders to keep excellent control of blood pressure and follow readings closely.  If remains elevated contact cardiology for appointment for medication evaluation.  Stressed the importance of lifestyle and nutrition

## 2023-07-13 NOTE — Progress Notes (Signed)
 [address]       [place] [address]             [phone]        Glenda Bauer [id number] January 16, 1951  History of Present Illness: The patient is a 73 year old female seen in the office in surveillance of her 4.4 cm ascending thoracic aortic aneurysm.  Measurements remain stable on most recent MRI.  She feels well overall.  Did recently have a viral illness she felt was likely the flu.  She does note that her blood pressure has been higher recently since being ill.  She denies chest pain or shortness of breath.  She does have a history of atrial fibrillation but does not have any type of palpitations.  She denies lower extremity edema.  She remains fairly active walking the dogs 4 times daily if the weather is reasonable.  Does describe a lot of anxiety but does practice self-help maneuvers to assist with controlling stress as able.  She understands the importance of exercise and healthy nutrition habits.  She does have a history of coronary artery disease by FFR which is nonobstructive.  She is on Zetia  and Repatha .  She did not like the Mediterranean diet but says she is eating in a healthwise manner.    Past Medical History:  Diagnosis Date   Allergy    Anxiety    Asthma    Atrial fibrillation (HCC)    Cancer (HCC)    Melanoma and Mycosis Fungosis   Cataract    Cutaneous T-cell lymphoma (HCC) 2011   Dermatologist treated in [place] ; Dr. Lore Ada   Depression    GERD (gastroesophageal reflux disease)    Heart murmur    Hyperlipidemia    Hypertension    Neuromuscular disorder (HCC)    Osteoarthritis    Sebaceous cyst 2022   right vulva   Thyroid  nodule    Urinary tract infection       Current Outpatient Medications on File Prior to Visit  Medication Sig Dispense Refill   albuterol  (VENTOLIN  HFA) 108 (90 Base) MCG/ACT inhaler INHALE 2 PUFFS BY MOUTH EVERY 6 HOURS AS NEEDED FOR WHEEZING OR SHORTNESS OF BREATH 8.5 g 10   ALPRAZolam   (XANAX ) 0.25 MG tablet TAKE 1-2 TABLETS BY MOUTH DAILY AS NEEDED 30 tablet 2   Evolocumab  (REPATHA  SURECLICK) 140 MG/ML SOAJ Inject 140 mg into the skin every 14 (fourteen) days. 2 mL 11   ezetimibe  (ZETIA ) 10 MG tablet Take 1 tablet (10 mg total) by mouth daily. 90 tablet 3   famotidine (PEPCID) 20 MG tablet Take 20 mg by mouth daily as needed for indigestion or heartburn.     FLUAD QUADRIVALENT 0.5 ML injection Inject 0.5 mLs into the muscle once.     FLUoxetine  (PROZAC ) 40 MG capsule Take 1 capsule (40 mg total) by mouth daily. 90 capsule 3   fluticasone  (FLONASE SENSIMIST ) 27.5 MCG/SPRAY nasal spray Place 1 spray into the nose in the morning.     levalbuterol  (XOPENEX ) 0.63 MG/3ML nebulizer solution Take 3 mLs (0.63 mg total) by nebulization every 6 (six) hours as needed for wheezing or shortness of breath. 225 mL 6   levocetirizine (XYZAL) 5 MG tablet Take 5 mg by mouth every other day. In the evening.     lisinopril -hydrochlorothiazide  (ZESTORETIC ) 20-12.5 MG tablet Take 1 tablet by mouth daily. 90 tablet 2   metoprolol  succinate (TOPROL -XL) 25 MG 24 hr tablet Take 1 tablet (25 mg  total) by mouth daily. Take with or immediately following a meal. 90 tablet 2   montelukast  (SINGULAIR ) 10 MG tablet TAKE 1 TABLET BY MOUTH AT BEDTIME 30 tablet 10   Multiple Vitamin (MULTIVITAMIN) tablet Take 1 tablet by mouth every evening.     Multiple Vitamins-Minerals (VISION FORMULA PO) Take 1 capsule by mouth every evening.     Omega-3 Fatty Acids (FISH OIL PO) Take 1 capsule by mouth daily.     SYMBICORT  160-4.5 MCG/ACT inhaler INHALE 2 PUFFS BY MOUTH TWICE DAILY 10.2 g 10   triamcinolone  cream (KENALOG ) 0.1 % Apply TOPICALLY twice daily 454 g 3   XARELTO  20 MG TABS tablet TAKE 1 TABLET BY MOUTH EVERY EVENING 30 tablet 10   No current facility-administered medications on file prior to visit.   Past Surgical History:  Procedure Laterality Date   ATRIAL FIBRILLATION ABLATION N/A 05/12/2022   Procedure:  ATRIAL FIBRILLATION ABLATION;  Surgeon: Inocencio Soyla Lunger, MD;  Location: [facility];  Service: Cardiovascular;  Laterality: N/A;   Cataracts Bilateral    CHOLECYSTECTOMY  2015   melenoma removal  1979   Sinus Polyps       No Known Allergies    BP (!) 164/84 (BP Location: Right Arm)   Pulse 60   Resp 18   Wt 181 lb (82.1 kg)   LMP 06/07/2000 (Approximate)   SpO2 94% Comment: RA  BMI 28.78 kg/m   Physical Exam Constitutional:      Appearance: Normal appearance. She is normal weight. She is not ill-appearing.  HENT:     Head: Normocephalic and atraumatic.  Eyes:     General: No scleral icterus. Cardiovascular:     Rate and Rhythm: Normal rate.     Heart sounds: Murmur heard.  Pulmonary:     Breath sounds: Normal breath sounds.  Abdominal:     Palpations: Abdomen is soft.     Tenderness: There is no abdominal tenderness.  Musculoskeletal:     Right lower leg: No edema.     Left lower leg: No edema.  Skin:    Capillary Refill: Capillary refill takes less than 2 seconds.     Coloration: Skin is not jaundiced or pale.  Neurological:     Mental Status: She is alert and oriented to person, place, and time.  Psychiatric:        Thought Content: Thought content normal.     Narrative & Impression  CLINICAL DATA:  Follow up aneurysm   EXAM: MRA CHEST WITH OR WITHOUT CONTRAST   TECHNIQUE: Angiographic images of the chest were obtained using MRA technique without and with intravenous contrast.   CONTRAST:  9 cc IV contrast from a single use bottle with 1 cc discarded. V[employer]way    COMPARISON:  MRI 06/16/2022.   FINDINGS: VASCULAR   Aorta: Once again there is ectasia of the ascending aorta measuring up to 4.4 x 4.3 cm. Previously 4.4 x 4.5 cm. The descending thoracic aorta has a diameter approaching up to 2.4 x 2.3 cm. The aortic root diameter approaches 3.9 cm. Distal aortic arch diameter of 2.6 cm. Small ductus bump again identified. Minimal  atherosclerotic change. The origin of the great vessels are grossly preserved. There is a tortuous course to the right subclavian artery with some atherosclerotic changes.   Heart: Heart itself is nonenlarged. No significant pericardial effusion. There is a small amount of fluid in the superior pericardial recess.   Pulmonary Arteries: Minimal ectasia of the central pulmonary arteries.  Other: None   NON-VASCULAR   Small hiatal hernia. No specific abnormal lymph node enlargement seen in the visualized portions of the mediastinum. No significant pleural effusion. Grossly clear lungs by MRI. Breathing motion noted. There is heterogeneous cystic lesion involving the right thyroid  lobe measuring up to 2.3 cm. Please correlate with previous workup and biopsy in 2020.   IMPRESSION: VASCULAR   Stable dilatation of the ascending aorta measuring up to 4.4 cm. Recommend annual imaging followup by CTA or MRA. This recommendation follows 2010 ACCF/AHA/AATS/ACR/ASA/SCA/SCAI/SIR/STS/SVM Guidelines for the Diagnosis and Management of Patients with Thoracic Aortic Disease. Circulation. 2010; 121: Z733-z630. Aortic aneurysm NOS (ICD10-I71.9)     Electronically Signed   By: Ranell Bring M.D.   On: 06/24/2023 11:34         ECHOCARDIOGRAM REPORT       Patient Name:   Glenda Bauer Date of Exam: 12/09/2021  Medical Rec #:  [id number]           Height:       66.0 in  Accession #:    [id number]          Weight:       183.2 lb  Date of Birth:  1950-12-19           BSA:          1.927 m  Patient Age:    70 years            BP:           117/63 mmHg  Patient Gender: F                   HR:           53 bpm.  Exam Location:  Outpatient   Procedure: 2D Echo, Color Doppler and Cardiac Doppler   Indications:    A-fib    History:        Patient has prior history of Echocardiogram examinations,  most                 recent 04/08/2020. CAD, Arrythmias:Atrial Fibrillation;  Risk                  Factors:Hypertension.    Sonographer:    Verla Snowman RCS  Referring Phys: [phone] [place] R [place]   IMPRESSIONS     1. Left ventricular ejection fraction, by estimation, is 60 to 65%. The  left ventricle has normal function. The left ventricle has no regional  wall motion abnormalities. Left ventricular diastolic parameters are  consistent with Grade I diastolic  dysfunction (impaired relaxation).   2. Right ventricular systolic function is normal. The right ventricular  size is normal.   3. The mitral valve is grossly normal. Mild mitral valve regurgitation.  No evidence of mitral stenosis.   4. The aortic valve is tricuspid. There is mild calcification of the  aortic valve. There is mild thickening of the aortic valve. Aortic valve  regurgitation is mild to moderate. Aortic valve sclerosis/calcification is  present, without any evidence of  aortic stenosis.   5. Aortic dilatation noted. There is mild-to-moderate dilatation of the  ascending aorta, measuring 45 mm. There is borderline dilatation of the  aortic arch, measuring 39 mm.   6. The inferior vena cava is normal in size with greater than 50%  respiratory variability, suggesting right atrial pressure of 3 mmHg.   Comparison(s): Compared to prior TTE on 04/08/2020, the LVEF appears more  vigorous  and the MR appears less (prior EF 50-55% with mild-to-moderate  MR). There continues to be mild-to-moderate AI.   FINDINGS   Left Ventricle: Left ventricular ejection fraction, by estimation, is 60  to 65%. The left ventricle has normal function. The left ventricle has no  regional wall motion abnormalities. The left ventricular internal cavity  size was normal in size. There is   no left ventricular hypertrophy. Left ventricular diastolic parameters  are consistent with Grade I diastolic dysfunction (impaired relaxation).   Right Ventricle: The right ventricular size is normal. No increase in  right ventricular wall  thickness. Right ventricular systolic function is  normal.   Left Atrium: Left atrial size was normal in size.   Right Atrium: Right atrial size was normal in size.   Pericardium: There is no evidence of pericardial effusion.   Mitral Valve: The mitral valve is grossly normal. There is mild thickening  of the mitral valve leaflet(s). There is mild calcification of the mitral  valve leaflet(s). Mild mitral annular calcification. Mild mitral valve  regurgitation. No evidence of  mitral valve stenosis.   Tricuspid Valve: The tricuspid valve is normal in structure. Tricuspid  valve regurgitation is trivial.   Aortic Valve: The aortic valve is tricuspid. There is mild calcification  of the aortic valve. There is mild thickening of the aortic valve. Aortic  valve regurgitation is mild to moderate. Aortic regurgitation PHT measures  904 msec. Aortic valve  sclerosis/calcification is present, without any evidence of aortic  stenosis. Aortic valve mean gradient measures 6.0 mmHg. Aortic valve peak  gradient measures 11.2 mmHg. Aortic valve area, by VTI measures 1.66 cm.   Pulmonic Valve: The pulmonic valve was normal in structure. Pulmonic valve  regurgitation is mild to moderate.   Aorta: Aortic dilatation noted. There is mild-to-moderate dilatation of  the ascending aorta, measuring 45 mm. There is borderline dilatation of  the aortic arch, measuring 39 mm.   Venous: The inferior vena cava is normal in size with greater than 50%  respiratory variability, suggesting right atrial pressure of 3 mmHg.   IAS/Shunts: The atrial septum is grossly normal.     LEFT VENTRICLE  PLAX 2D  LVIDd:         4.90 cm   Diastology  LVIDs:         3.30 cm   LV e' medial:    6.20 cm/s  LV PW:         0.80 cm   LV E/e' medial:  11.8  LV IVS:        0.70 cm   LV e' lateral:   7.98 cm/s  LVOT diam:     1.90 cm   LV E/e' lateral: 9.2  LV SV:         68  LV SV Index:   36  LVOT Area:     2.84 cm      RIGHT VENTRICLE             IVC  RV Basal diam:  2.80 cm     IVC diam: 2.10 cm  RV Mid diam:    1.90 cm  RV S prime:     13.10 cm/s  TAPSE (M-mode): 1.7 cm   LEFT ATRIUM           Index        RIGHT ATRIUM           Index  LA diam:      3.10 cm  1.61 cm/m   RA Area:     16.40 cm  LA Vol (A4C): 44.6 ml 23.15 ml/m  RA Volume:   37.20 ml  19.31 ml/m   AORTIC VALVE                     PULMONIC VALVE  AV Area (Vmax):    1.71 cm      PV Vmax:          0.86 m/s  AV Area (Vmean):   1.68 cm      PV Peak grad:     3.0 mmHg  AV Area (VTI):     1.66 cm      PR End Diast Vel: 5.57 msec  AV Vmax:           167.00 cm/s  AV Vmean:          112.000 cm/s  AV VTI:            0.413 m  AV Peak Grad:      11.2 mmHg  AV Mean Grad:      6.0 mmHg  LVOT Vmax:         100.80 cm/s  LVOT Vmean:        66.550 cm/s  LVOT VTI:          0.242 m  LVOT/AV VTI ratio: 0.58  AI PHT:            904 msec    AORTA  Ao Root diam: 3.50 cm  Ao Asc diam:  4.50 cm   MITRAL VALVE               TRICUSPID VALVE  MV Area (PHT): 3.17 cm    TR Peak grad:   19.5 mmHg  MV Decel Time: 239 msec    TR Vmax:        221.00 cm/s  MV E velocity: 73.40 cm/s  MV A velocity: 71.10 cm/s  SHUNTS  MV E/A ratio:  1.03        Systemic VTI:  0.24 m                             Systemic Diam: 1.90 cm   Powell Sorrow MD  Electronically signed by Powell Sorrow MD  Signature Date/Time: 12/09/2021/12:05:36 PM         A/P: From her aneurysm perspective she is very stable.  I did emphasize the importance of strict blood pressure control and she is going to keep a diary over the next few weeks and if readings continue to be persistently elevated she knows to contact her doctor as she may require either higher doses of current medication or additional agent.  She does feel as though her blood pressure was well-controlled before this recent illness.  We will see the patient again on a yearly basis and repeat chest MRI at that  time.      Risk Modification: Discussed  Statin: Yes  Smoking cessation instruction/counseling given: Former smoker quit 40 years ago  Patient was counseled on importance of Blood Pressure Control.  Despite Medical intervention if the patient notices persistently elevated blood pressure readings.  They are instructed to contact their Primary Care Physician  Please avoid use of Fluoroquinolones as this can potentially increase your risk of Aortic Rupture and/or Dissection  Patient educated on signs and symptoms of Aortic Dissection, handout also provided in AVS  Muddy E  Stacee Earp, PA-C 07/13/23

## 2023-08-11 ENCOUNTER — Encounter (HOSPITAL_BASED_OUTPATIENT_CLINIC_OR_DEPARTMENT_OTHER): Payer: Self-pay | Admitting: Cardiology

## 2023-08-11 ENCOUNTER — Ambulatory Visit (HOSPITAL_BASED_OUTPATIENT_CLINIC_OR_DEPARTMENT_OTHER): Payer: Medicare Other | Admitting: Cardiology

## 2023-08-11 VITALS — BP 140/72 | HR 60 | Ht 66.5 in | Wt 178.9 lb

## 2023-08-11 DIAGNOSIS — E785 Hyperlipidemia, unspecified: Secondary | ICD-10-CM | POA: Diagnosis not present

## 2023-08-11 DIAGNOSIS — Z789 Other specified health status: Secondary | ICD-10-CM

## 2023-08-11 DIAGNOSIS — D6869 Other thrombophilia: Secondary | ICD-10-CM | POA: Diagnosis not present

## 2023-08-11 DIAGNOSIS — I10 Essential (primary) hypertension: Secondary | ICD-10-CM

## 2023-08-11 DIAGNOSIS — I77819 Aortic ectasia, unspecified site: Secondary | ICD-10-CM | POA: Diagnosis not present

## 2023-08-11 DIAGNOSIS — I480 Paroxysmal atrial fibrillation: Secondary | ICD-10-CM

## 2023-08-11 DIAGNOSIS — I2510 Atherosclerotic heart disease of native coronary artery without angina pectoris: Secondary | ICD-10-CM

## 2023-08-11 DIAGNOSIS — Z79899 Other long term (current) drug therapy: Secondary | ICD-10-CM | POA: Diagnosis not present

## 2023-08-11 MED ORDER — LISINOPRIL-HYDROCHLOROTHIAZIDE 20-12.5 MG PO TABS: 2.0000 | ORAL_TABLET | Freq: Every day | ORAL | 3 refills | Status: DC

## 2023-08-11 NOTE — Patient Instructions (Addendum)
 Medication Instructions:  Your physician has recommended you make the following change in your medication:   Increase lisinopril-hydrochlorothiazide to 2 pills (total of 40 mg-25 mg daily). I sent this to the pharmacy that does the pill pack and asked for a 30 day bottle of additional pills so that you can get your current pill pack to the 2 pills/day dose.  If you have increasing lightheadedness/dizziness, let me know. We want your blood pressure as low as you can tolerate to take stress off the aorta.  Make sure you stay hydrated.  *If you need a refill on your cardiac medications before your next appointment, please call your pharmacy*   Lab Work: Your physician recommends that you return for lab work: BMP  If you have labs (blood work) drawn today and your tests are completely normal, you will receive your results only by: MyChart Message (if you have MyChart) OR A paper copy in the mail If you have any lab test that is abnormal or we need to change your treatment, we will call you to review the results.   Follow-Up: At [facility], you and your health needs are our priority.  As part of our continuing mission to provide you with exceptional heart care, we have created designated Provider Care Teams.  These Care Teams include your primary Cardiologist (physician) and Advanced Practice Providers (APPs -  Physician Assistants and Nurse Practitioners) who all work together to provide you with the care you need, when you need it.  We recommend signing up for the patient portal called "MyChart".  Sign up information is provided on this After Visit Summary.  MyChart is used to connect with patients for Virtual Visits (Telemedicine).  Patients are able to view lab/test results, encounter notes, upcoming appointments, etc.  Non-urgent messages can be sent to your provider as well.   To learn more about what you can do with MyChart, go to [email or web address].    Your next  appointment:   6 month(s)  Provider:   Jodelle Red, MD, Eligha Bridegroom, NP, or Gillian Shields, NP

## 2023-08-11 NOTE — Progress Notes (Signed)
 Cardiology Office Note:  .   Date:  08/11/2023  ID:  Glenda Bauer, DOB 24-Nov-1950, MRN [id number] PCP: Shirline Frees, NP  [facility] Providers Cardiologist:  Meriam Sprague, MD (Inactive) Electrophysiologist:  Will Jorja Loa, MD {  History of Present Illness: .   Itati Bauer is a 73 y.o. female with PMH hypertension, ascending aortic aneurysm, paroxysmal atrial fibrillation, cutaneous T cell lymphoma. She was previously followed by Dr. Shari Prows and established care with me on 08/10/33.  Pertinent CV history: Diagnosed with afib in 2021. Has been maintained on flecainide and then underwent ablation 05/12/22. Cardiac CTA 2023 with concern for severe plaque/stenosis in mLAD, but FFR negative.   Today: Blood pressure has been slowly increasing. Hasn't been under 130 in some time, started rising around December. Last visit in cardiology with Tessa  01/10/23 was 118/70.   Current hypertension medications:       Sig   metoprolol succinate (TOPROL-XL) 25 MG 24 hr tablet (Taking) Take 1 tablet (25 mg total) by mouth daily. Take with or immediately following a meal.   lisinopril-hydrochlorothiazide (ZESTORETIC) 20-12.5 MG tablet Take 2 tablets by mouth daily.       In 05/2022, BP was 100/60 on lisinopril 40-hydrochlorothiazide 25 mg daily (2 pills), so she cut it back to 1 pill/day. No change in her lightheadedness with this. She feels her lightheadedness is more sinuses. We also discussed the importance of staying hydrated.   No chest pain. No shortness of breath unless she is climbing a hill. Had URI in December and had to cut back on her walking due to shortness of breath, working her way back up.  Not using CPAP for about a year, she was uncomfortable with it, will follow up with pulmonary.  ROS: Denies chest pain, shortness of breath at rest or with normal exertion. No PND, orthopnea, LE edema or unexpected weight gain. No syncope or palpitations. ROS otherwise  negative except as noted.   Studies Reviewed: Marland Kitchen    EKG:  EKG Interpretation Date/Time:  Thursday August 11 2023 08:56:04 EST Ventricular Rate:  59 PR Interval:  164 QRS Duration:  80 QT Interval:  454 QTC Calculation: 449 R Axis:   15  Text Interpretation: Sinus bradycardia When compared with ECG of 21-Jun-2022 14:15, No significant change was found Confirmed by Jodelle Red [phone]) on 08/11/2023 9:06:59 AM    Physical Exam:   VS:  BP (!) 140/72   Pulse 60   Ht 5' 6.5" (1.689 m)   Wt 178 lb 14.4 oz (81.1 kg)   LMP 06/07/2000 (Approximate)   SpO2 95%   BMI 28.44 kg/m    Wt Readings from Last 3 Encounters:  08/11/23 178 lb 14.4 oz (81.1 kg)  07/13/23 181 lb (82.1 kg)  02/24/23 180 lb (81.6 kg)    GEN: Well nourished, well developed in no acute distress HEENT: Normal, moist mucous membranes NECK: No JVD CARDIAC: regular rhythm, normal S1 and S2, no rubs or gallops. No murmur. VASCULAR: Radial and DP pulses 2+ bilaterally. No carotid bruits RESPIRATORY:  Clear to auscultation without rales, wheezing or rhonchi  ABDOMEN: Soft, non-tender, non-distended MUSCULOSKELETAL:  Ambulates independently SKIN: Warm and dry, no edema NEUROLOGIC:  Alert and oriented x 3. No focal neuro deficits noted. PSYCHIATRIC:  Normal affect    ASSESSMENT AND PLAN: .    CAD Hypercholesterolemia Statin intolerance -did not tolerate statin -no chest pain, walks routinely -on repatha and ezetimibe -last lipids 12/2022 at goal, reviewed. LDL 50  Paroxysmal atrial fibrillation -no symptoms -s/p ablation -CHA2DS2/VAS Stroke Risk Points=4  -continue Xarelto for secondary hypercoagulable state, no major bleeding issues.  Hypertension -not at goal -increase to lisinopril 40-hydrochlorothiazide 25 mg daily (2 pills instead of one) -monitor symptoms and blood pressure. She will contact me if BP not consistently <130/80  Mild to moderate ascending aortic dilation -stable, followed by Dr.  Laneta Simmers, last saw Gershon Crane a month ago -reviewed BP management and changes, as above -reviewed exercise recommendations  Mild to moderate MR -manage BP as above.  CV risk counseling and prevention -recommend heart healthy/Mediterranean diet, with whole grains, fruits, vegetable, fish, lean meats, nuts, and olive oil. Limit salt. -recommend moderate walking, 3-5 times/week for 30-50 minutes each session. Aim for at least 150 minutes.week. Goal should be pace of 3 miles/hours, or walking 1.5 miles in 30 minutes -recommend avoidance of tobacco products. Avoid excess alcohol.  Dispo: 6 mos  Total time of encounter: I spent 42 minutes dedicated to the care of this patient on the date of this encounter to include pre-visit review of records, face-to-face time with the patient discussing conditions above, and clinical documentation with the electronic health record. We specifically spent time today discussing her prior med history, BP goals, options for med changes, discussing echo findings/MR, reviewing symptoms to watch for   Signed, Jodelle Red, MD   Jodelle Red, MD, PhD, [facility] [place] [facility]  [facility]  [place]  [facility] [address], [address] [place], [place] [address] [phone]

## 2023-08-18 ENCOUNTER — Ambulatory Visit: Payer: Self-pay | Admitting: Adult Health

## 2023-08-18 NOTE — Telephone Encounter (Signed)
 Chief Complaint: Urinary symptoms / Falls Symptoms: see above Frequency: see notes Pertinent Negatives: Patient denies fever Disposition: [] ED /[] Urgent Care (no appt availability in office) / [x] Appointment(In office/virtual)/ []  [facility]/ [] Home Care/ [] Refused Recommended Disposition /[] [facility]/ []  Follow-up with PCP Additional Notes: Patient called to report urinary symptoms of constant pinching feeling/discomfort, increased frequency, and increased odor x 3 days. Patient denies back pain, blood in urine, pain with urination, and fever. Patient also states she has had two falls recently. Last fall was on Saturday night where she is unsure of cause of fall but hit her hand on the ground, suffered no injuries. The fall two weeks prior to that she fell and hit her head on the concrete. Patient denies loss of consciousness on either fall, and denies evaluation by HCP. Patient denies open wounds. Patient appt made for tomorrow morning for further evaluation.   Copied from CRM [id number]. Topic: Clinical - Red Word Triage >> Aug 18, 2023  4:26 PM Isabell A wrote: Red Word that prompted transfer to Nurse Triage: Patient calling to schedule an appointment for a few falls and for UTI. Fell on Saturday outside on the side walk, bruising on hands. UTI symptoms experiencing: frequent urination. Reason for Disposition  Urinating more frequently than usual (i.e., frequency)  MILD weakness (i.e., does not interfere with ability to work, go to school, normal activities)  (Exception: Mild weakness is a chronic symptom.)  Answer Assessment - Initial Assessment Questions 1. SYMPTOM: "What's the main symptom you're concerned about?" (e.g., frequency, incontinence)     Constant discomfort, pinching feeling 2. ONSET: "When did the  discomfort  start?"     Monday 3. PAIN: "Is there any pain?" If Yes, ask: "How bad is it?" (Scale: 1-10; mild, moderate, severe)     5 4. CAUSE: "What do  you think is causing the symptoms?"     Unsure, believes to be UTI 5. OTHER SYMPTOMS: "Do you have any other symptoms?" (e.g., blood in urine, fever, flank pain, pain with urination)     Groin pain, increased odor  Answer Assessment - Initial Assessment Questions 1. MECHANISM: "How did the fall happen?"     Outside with the dog and dropped but doesn't remember tripping 2. DOMESTIC VIOLENCE AND ELDER ABUSE SCREENING: "Did you fall because someone pushed you or tried to hurt you?" If Yes, ask: "Are you safe now?"     No 3. ONSET: "When did the fall happen?" (e.g., minutes, hours, or days ago)     Saturday night 4. LOCATION: "What part of the body hit the ground?" (e.g., back, buttocks, head, hips, knees, hands, head, stomach)     Hand hit the ground,  5. INJURY: "Did you hurt (injure) yourself when you fell?" If Yes, ask: "What did you injure? Tell me more about this?" (e.g., body area; type of injury; pain severity)"     No  6. PAIN: "Is there any pain?" If Yes, ask: "How bad is the pain?" (e.g., Scale 1-10; or mild,  moderate, severe)   - NONE (0): No pain   - MILD (1-3): Doesn't interfere with normal activities    - MODERATE (4-7): Interferes with normal activities or awakens from sleep    - SEVERE (8-10): Excruciating pain, unable to do any normal activities      No 7. SIZE: For cuts, bruises, or swelling, ask: "How large is it?" (e.g., inches or centimeters)      No 9. OTHER SYMPTOMS: "Do  you have any other symptoms?" (e.g., dizziness, fever, weakness; new onset or worsening).      Head fullness/lightheaded 10. CAUSE: "What do you think caused the fall (or falling)?" (e.g., tripped, dizzy spell)       Related to walking dog and being pulled over/tripped, recent falls unsure  Protocols used: Urinary Symptoms-A-AH, Falls and Falling-A-AH

## 2023-08-19 ENCOUNTER — Ambulatory Visit: Admitting: Adult Health

## 2023-08-19 VITALS — BP 138/80 | HR 54 | Temp 97.7°F | Ht 65.0 in | Wt 175.0 lb

## 2023-08-19 DIAGNOSIS — R2681 Unsteadiness on feet: Secondary | ICD-10-CM

## 2023-08-19 DIAGNOSIS — R350 Frequency of micturition: Secondary | ICD-10-CM | POA: Diagnosis not present

## 2023-08-19 DIAGNOSIS — R42 Dizziness and giddiness: Secondary | ICD-10-CM | POA: Diagnosis not present

## 2023-08-19 DIAGNOSIS — M7632 Iliotibial band syndrome, left leg: Secondary | ICD-10-CM | POA: Diagnosis not present

## 2023-08-19 DIAGNOSIS — R300 Dysuria: Secondary | ICD-10-CM

## 2023-08-19 LAB — POCT URINALYSIS DIPSTICK
Bilirubin, UA: NEGATIVE
Blood, UA: POSITIVE
Glucose, UA: NEGATIVE
Ketones, UA: NEGATIVE
Leukocytes, UA: NEGATIVE
Nitrite, UA: NEGATIVE
Protein, UA: NEGATIVE
Spec Grav, UA: 1.01 (ref 1.010–1.025)
Urobilinogen, UA: 0.2 U/dL
pH, UA: 6 (ref 5.0–8.0)

## 2023-08-19 MED ORDER — MECLIZINE HCL 25 MG PO TABS: 25.0000 mg | ORAL_TABLET | Freq: Three times a day (TID) | ORAL | 0 refills | Status: DC | PRN

## 2023-08-19 NOTE — Progress Notes (Addendum)
 Subjective:    Patient ID: Glenda Bauer, female    DOB: [birth year], 73 y.o.   MRN: [id number]  HPI 73 year old female who  has a past medical history of Allergy, Anxiety, Asthma, Atrial fibrillation (HCC), Cancer (HCC), Cataract, Cutaneous T-cell lymphoma (HCC) (2011), Depression, GERD (gastroesophageal reflux disease), Heart murmur, Hyperlipidemia, Hypertension, Neuromuscular disorder (HCC), Osteoarthritis, Sebaceous cyst (2022), Thyroid nodule, and Urinary tract infection.  She presents to the office today for an acute visit with multiple issues.   First issue is out of concern of a urinary tract infection.  She reports for the last 3 days she has had a constant "pinching feeling"/discomfort with urination, increased frequency and increased odor.  She has not had any back pain, hematuria, fevers, chills. She drinks coffee in the morning and has ben trying to increase her water consumption   Also reports that she has had 2 falls recently.  Her first fall was 2 weeks ago when she fell and hit her head on concrete after stepping up onto a curb.   She denies loss of consciousness or headache but did have blurred vision for two days after the fall; this has resolved.   She also reports that she fell roughly a week ago, unsure of the cause of the fall but reports she was walking her dog when it was dark out and may have got tripped up on the sie walk. With this fall she hit her and hand the ground but does not have any injuries. She has not had any syncope.  He has felt unsteady on her feet and has been doing stretching exercises for left-sided IT band syndrome.  He also reports that over the last week she has developed vertigo.  She reports that she usually gets vertigo when she has issues with seasonal allergy-like symptoms.  He does not feel as though she has a sinus infection. Her falls where not from vertigo    Review of Systems See HPI   Past Medical History:  Diagnosis Date   Allergy     Anxiety    Asthma    Atrial fibrillation (HCC)    Cancer (HCC)    Melanoma and Mycosis Fungosis   Cataract    Cutaneous T-cell lymphoma (HCC) 2011   Dermatologist treated in [place]; Dr. Maggie Schwalbe   Depression    GERD (gastroesophageal reflux disease)    Heart murmur    Hyperlipidemia    Hypertension    Neuromuscular disorder (HCC)    Osteoarthritis    Sebaceous cyst 2022   right vulva   Thyroid nodule    Urinary tract infection     Social History   Socioeconomic History   Marital status: Divorced    Spouse name: Not on file   Number of children: 3   Years of education: 16   Highest education level: Some college, no degree  Occupational History   Occupation: Warehouse manager   Tobacco Use   Smoking status: Former    Current packs/day: 0.00    Average packs/day: 0.3 packs/day for 10.0 years (2.5 ttl pk-yrs)    Types: Cigarettes    Start date: 06/08/1971    Quit date: 06/07/1981    Years since quitting: 42.2    Passive exposure: Past   Smokeless tobacco: Never   Tobacco comments:    Former smoker 12/02/21  Vaping Use   Vaping status: Never Used  Substance and Sexual Activity   Alcohol use: No    Alcohol/week: 0.0 standard  drinks of alcohol   Drug use: No   Sexual activity: Not Currently    Birth control/protection: Post-menopausal  Other Topics Concern   Not on file  Social History Narrative   Separated    Three children ( one lives locally)    Social Drivers of Health   Financial Resource Strain: Medium Risk (02/21/2023)   Overall Financial Resource Strain (CARDIA)    Difficulty of Paying Living Expenses: Somewhat hard  Food Insecurity: No Food Insecurity (02/21/2023)   Hunger Vital Sign    Worried About Running Out of Food in the Last Year: Never true    Ran Out of Food in the Last Year: Never true  Transportation Needs: No Transportation Needs (02/21/2023)   PRAPARE - Administrator, Civil Service (Medical): No    Lack of Transportation  (Non-Medical): No  Physical Activity: Unknown (02/21/2023)   Exercise Vital Sign    Days of Exercise per Week: Patient declined    Minutes of Exercise per Session: 60 min  Stress: Stress Concern Present (02/21/2023)   [employer] of Occupational Health - Occupational Stress Questionnaire    Feeling of Stress : Rather much  Social Connections: Moderately Isolated (02/21/2023)   Social Connection and Isolation Panel [NHANES]    Frequency of Communication with Friends and Family: More than three times a week    Frequency of Social Gatherings with Friends and Family: Twice a week    Attends Religious Services: Never    Database administrator or Organizations: No    Attends Engineer, structural: More than 4 times per year    Marital Status: Divorced  Intimate Partner Violence: Not At Risk (10/11/2022)   Humiliation, Afraid, Rape, and Kick questionnaire    Fear of Current or Ex-Partner: No    Emotionally Abused: No    Physically Abused: No    Sexually Abused: No    Past Surgical History:  Procedure Laterality Date   ATRIAL FIBRILLATION ABLATION N/A 05/12/2022   Procedure: ATRIAL FIBRILLATION ABLATION;  Surgeon: Regan Lemming, MD;  Location: [facility];  Service: Cardiovascular;  Laterality: N/A;   Cataracts Bilateral    CHOLECYSTECTOMY  2015   melenoma removal  1979   Sinus Polyps       Family History  Problem Relation Age of Onset   Arthritis Mother    Ovarian cancer Mother        Metastatic   Breast cancer Mother 49   Thyroid disease Mother    Parkinson's disease Father    Crohn's disease Brother    Schizophrenia Brother    Colon cancer Neg Hx    Esophageal cancer Neg Hx    Rectal cancer Neg Hx    Stomach cancer Neg Hx     No Known Allergies  Current Outpatient Medications on File Prior to Visit  Medication Sig Dispense Refill   albuterol (VENTOLIN HFA) 108 (90 Base) MCG/ACT inhaler INHALE 2 PUFFS BY MOUTH EVERY 6 HOURS AS NEEDED FOR WHEEZING  OR SHORTNESS OF BREATH 8.5 g 10   ALPRAZolam (XANAX) 0.25 MG tablet TAKE 1-2 TABLETS BY MOUTH DAILY AS NEEDED 30 tablet 2   Evolocumab (REPATHA SURECLICK) 140 MG/ML SOAJ Inject 140 mg into the skin every 14 (fourteen) days. 2 mL 11   ezetimibe (ZETIA) 10 MG tablet Take 1 tablet (10 mg total) by mouth daily. 90 tablet 3   famotidine (PEPCID) 20 MG tablet Take 20 mg by mouth daily as needed for  indigestion or heartburn.     FLUAD QUADRIVALENT 0.5 ML injection Inject 0.5 mLs into the muscle once.     FLUoxetine (PROZAC) 40 MG capsule Take 1 capsule (40 mg total) by mouth daily. 90 capsule 3   fluticasone (FLONASE SENSIMIST) 27.5 MCG/SPRAY nasal spray Place 1 spray into the nose in the morning.     levalbuterol (XOPENEX) 0.63 MG/3ML nebulizer solution Take 3 mLs (0.63 mg total) by nebulization every 6 (six) hours as needed for wheezing or shortness of breath. 225 mL 6   levocetirizine (XYZAL) 5 MG tablet Take 5 mg by mouth every other day. In the evening.     lisinopril-hydrochlorothiazide (ZESTORETIC) 20-12.5 MG tablet Take 2 tablets by mouth daily. 90 tablet 3   metoprolol succinate (TOPROL-XL) 25 MG 24 hr tablet Take 1 tablet (25 mg total) by mouth daily. Take with or immediately following a meal. 90 tablet 2   montelukast (SINGULAIR) 10 MG tablet TAKE 1 TABLET BY MOUTH AT BEDTIME 30 tablet 10   Multiple Vitamin (MULTIVITAMIN) tablet Take 1 tablet by mouth every evening.     Multiple Vitamins-Minerals (VISION FORMULA PO) Take 1 capsule by mouth every evening.     Omega-3 Fatty Acids (FISH OIL PO) Take 1 capsule by mouth daily.     SYMBICORT 160-4.5 MCG/ACT inhaler INHALE 2 PUFFS BY MOUTH TWICE DAILY 10.2 g 10   triamcinolone cream (KENALOG) 0.1 % Apply TOPICALLY twice daily 454 g 3   XARELTO 20 MG TABS tablet TAKE 1 TABLET BY MOUTH EVERY EVENING 30 tablet 10   No current facility-administered medications on file prior to visit.    BP 138/80   Pulse (!) 54   Temp 97.7 F (36.5 C) (Oral)   Ht  5\' 5"  (1.651 m)   Wt 175 lb (79.4 kg)   LMP 06/07/2000 (Approximate)   SpO2 99%   BMI 29.12 kg/m       Objective:   Physical Exam Vitals and nursing note reviewed.  Constitutional:      Appearance: Normal appearance.  Eyes:     Extraocular Movements:     Right eye: No nystagmus (horizontal).     Left eye: No nystagmus (horizontal).  Cardiovascular:     Rate and Rhythm: Normal rate and regular rhythm.     Pulses: Normal pulses.     Heart sounds: Normal heart sounds.  Pulmonary:     Effort: Pulmonary effort is normal.     Breath sounds: Normal breath sounds.  Abdominal:     General: Abdomen is flat. Bowel sounds are normal.     Palpations: Abdomen is soft.     Tenderness: There is no right CVA tenderness or left CVA tenderness.  Musculoskeletal:        General: Tenderness (left IT band) present.  Skin:    General: Skin is warm.  Neurological:     General: No focal deficit present.     Mental Status: She is alert and oriented to person, place, and time.     Sensory: Sensation is intact.     Motor: Motor function is intact.     Coordination: Coordination is intact.     Gait: Gait is intact.     Comments: Momentarily became symptomatic with change in positions.  Psychiatric:        Mood and Affect: Mood normal.        Behavior: Behavior normal.        Thought Content: Thought content normal.  Judgment: Judgment normal.        Assessment & Plan:  1. Urinary frequency (Primary)  - POC Urinalysis Dipstick - negative  nitrites and leukocytes. Will send culture due to symptoms. I am also going to have her cut out coffee over the weekend to see if symptoms resolve  - Culture, Urine; Future  2. Gait instability  - Ambulatory referral to Physical Therapy  3. Vertigo  - meclizine (ANTIVERT) 25 MG tablet; Take 1 tablet (25 mg total) by mouth 3 (three) times daily as needed for dizziness.  Dispense: 30 tablet; Refill: 0  4. It band syndrome, left  - Ambulatory  referral to Physical Therapy  Shirline Frees, NP

## 2023-08-20 LAB — URINE CULTURE
MICRO NUMBER:: 16202864
Result:: NO GROWTH
SPECIMEN QUALITY:: ADEQUATE

## 2023-08-23 ENCOUNTER — Other Ambulatory Visit: Payer: Self-pay | Admitting: Adult Health

## 2023-08-23 DIAGNOSIS — F419 Anxiety disorder, unspecified: Secondary | ICD-10-CM

## 2023-08-24 ENCOUNTER — Ambulatory Visit: Attending: Adult Health

## 2023-08-24 ENCOUNTER — Telehealth: Payer: Self-pay | Admitting: Adult Health

## 2023-08-24 ENCOUNTER — Other Ambulatory Visit: Payer: Self-pay

## 2023-08-24 DIAGNOSIS — M6281 Muscle weakness (generalized): Secondary | ICD-10-CM | POA: Insufficient documentation

## 2023-08-24 DIAGNOSIS — R2681 Unsteadiness on feet: Secondary | ICD-10-CM | POA: Diagnosis not present

## 2023-08-24 DIAGNOSIS — M25552 Pain in left hip: Secondary | ICD-10-CM | POA: Diagnosis not present

## 2023-08-24 DIAGNOSIS — M62838 Other muscle spasm: Secondary | ICD-10-CM

## 2023-08-24 DIAGNOSIS — M7632 Iliotibial band syndrome, left leg: Secondary | ICD-10-CM | POA: Diagnosis not present

## 2023-08-24 DIAGNOSIS — R42 Dizziness and giddiness: Secondary | ICD-10-CM | POA: Insufficient documentation

## 2023-08-24 NOTE — Telephone Encounter (Signed)
 Okay for refill?

## 2023-08-24 NOTE — Therapy (Signed)
 OUTPATIENT PHYSICAL THERAPY LOWER EXTREMITY EVALUATION   Patient Name: Glenda Bauer MRN: [id number] DOB:11/09/1950, 73 y.o., female Today's Date: 08/24/2023  END OF SESSION:  PT End of Session - 08/24/23 1007     Visit Number 1    Date for PT Re-Evaluation 11/16/23    Authorization Type [employer] Medicare    Progress Note Due on Visit 10    PT Start Time 0930    PT Stop Time 1010    PT Time Calculation (min) 40 min    Activity Tolerance Patient tolerated treatment well    Behavior During Therapy WFL for tasks assessed/performed             Past Medical History:  Diagnosis Date   Allergy    Anxiety    Asthma    Atrial fibrillation (HCC)    Cancer (HCC)    Melanoma and Mycosis Fungosis   Cataract    Cutaneous T-cell lymphoma (HCC) 2011   Dermatologist treated in [place]; Dr. Maggie Schwalbe   Depression    GERD (gastroesophageal reflux disease)    Heart murmur    Hyperlipidemia    Hypertension    Neuromuscular disorder (HCC)    Osteoarthritis    Sebaceous cyst 2022   right vulva   Thyroid nodule    Urinary tract infection    Past Surgical History:  Procedure Laterality Date   ATRIAL FIBRILLATION ABLATION N/A 05/12/2022   Procedure: ATRIAL FIBRILLATION ABLATION;  Surgeon: Regan Lemming, MD;  Location: [facility];  Service: Cardiovascular;  Laterality: N/A;   Cataracts Bilateral    CHOLECYSTECTOMY  2015   melenoma removal  1979   Sinus Polyps      Patient Active Problem List   Diagnosis Date Noted   Dilation of aorta (HCC) 08/11/2023   Statin intolerance 08/11/2023   Coronary artery disease involving native coronary artery of native heart without angina pectoris 08/11/2023   Hyperlipidemia 01/05/2022   OSA (obstructive sleep apnea) 07/28/2021   Thoracic aortic aneurysm without rupture (HCC) 01/08/2021   Sebaceous cyst 2022   Paroxysmal atrial fibrillation (HCC) 04/03/2020   Hypercoagulable state due to paroxysmal atrial fibrillation (HCC)  04/03/2020   Thyroid nodule    Osteoarthritis    Cyst of knee joint 02/13/2016   Sinusitis 10/30/2015   Asthma with exacerbation 10/30/2015   Sciatica 03/06/2015   Abdominal pain, right upper quadrant 02/12/2015   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 She states that she walks her dog a lot, was doing over 5 miles a day. The pain goes down the side of her leg to knee and goes up to Lt hip. She lives on second floor; has one railing.   PERTINENT HISTORY: A-fib, abdominal surgery (cholecystectomy), anxiety, asthma, hx melanoma, depression PAIN:  Are you having pain? Yes: NPRS scale: 5/10 currently, 9/10 at worst  Pain location: Lt lateral leg and hip Pain description:  aching, tight Aggravating factors: worst at night, has to change positions; stairs Relieving factors: rest  PRECAUTIONS: Other: fall risk  RED FLAGS: None   WEIGHT BEARING RESTRICTIONS: No  FALLS:  Has patient fallen in last 6 months? Yes. Number of falls 5x  LIVING ENVIRONMENT: Lives with: lives with their family Lives in: House/apartment Stairs: Yes: Internal: up to bedroom steps; on right going up Has following equipment at home: None  OCCUPATION: retired  PLOF: Independent  PATIENT GOALS: get back to walking the dog 5 miles a day  NEXT MD VISIT: none scheduled - 6  months  OBJECTIVE:  Note: Objective measures were completed at Evaluation unless otherwise noted. 08/24/23: DIAGNOSTIC FINDINGS: none for this issue   PATIENT SURVEYS:  LEFS: 47.5  COGNITION: Overall cognitive status: Within functional limits for tasks assessed     SENSATION: WFL  POSTURE: rounded shoulders, forward head, decreased lumbar lordosis, increased thoracic kyphosis, and posterior pelvic tilt  PALPATION: Significant tenderness throughout Lt glutes, tensor fascia latte, and lateral thigh  LOWER EXTREMITY MMT:  MMT Right eval Left eval  Hip flexion 4 3  Hip extension 3 2  Hip abduction 4 2  Hip adduction 3 3   (Blank rows = not tested)    FUNCTIONAL TESTS:  Squat: limited descent, unsteady, had to hold table Single leg stance:  Rt: pelvic drop, unable without UE support  Lt: pelvic drop, unable without UE support 5x sit<>stand: 17 seconds  Timed up and go (TUG): 16 seconds  GAIT:  Comments: Unsteady, antalgic Lt LE, decreased bil hip extnesion                                                                                                                                TREATMENT DATE:  08/24/23 Manual: Trigger Point Dry Needling  Initial Treatment: Pt instructed on Dry Needling rational, procedures, and possible side effects. Pt instructed to expect mild to moderate muscle soreness later in the day and/or into the next day.  Pt instructed in methods to reduce muscle soreness. Pt instructed to continue prescribed HEP. Patient was educated on signs and symptoms of infection and other risk factors and advised to seek medical attention should they occur.  Patient verbalized understanding of these instructions and education.   Patient Verbal Consent Given: Yes Education Handout Provided: Yes Muscles Treated: Lt glutes/TFL Electrical Stimulation Performed: No Treatment Response/Outcome: significant twitch response and release  Soft tissue mobilization  to Lt posterolateral hip muscles  Neuromuscular re-education: Seated hip adduction 10 x 5 seconds Seated hip abduction 10 x 5 seconds Exercises: Seated piriformis stretch 60 sec bil Supine lower trunk rotation 10x   PATIENT EDUCATION:  Education details: See above Person educated: Patient Education method: Programmer, multimedia, Demonstration, Actor cues, Verbal cues, and Handouts Education comprehension: verbalized understanding  HOME EXERCISE PROGRAM: [id number]  ASSESSMENT:  CLINICAL IMPRESSION: Patient is a 73 y.o. female who was seen today for physical therapy evaluation and  treatment for Lt lateral hip/thigh pain and balance. Exam findings notable for abnormal gait and posture, decreased Lt hip strength, tenderness to palpation and trigger points throughout Lt posterolateral hip/lateral thigh, increased time with 5x sit<>stand and TUG, and unable to perform single leg stance without bil UE support. Signs and symptoms are most consistent with hip weakness and myofascial restriction throughout Lt hip; balance has likely gotten worse with weakness and fear of falling, but may also have been impacted by vertigo. Initial treatment consisted of gentle hip strengthening and mobility activities with good tolerance; dry needling also performed to TFL and glute med with significant twitch response and release. She did report increase in pain to 7/10 at end of treatment session; we discussed likelihood of residual soreness in Lt hip due to twitch response from dry needling. We discussed in detail that she does not need to push stretches and exercises to point of pain, that just sensation of stretch and muscles working is the appropriate levels of challenge. She will continue to benefit from skilled PT intervention in order to strengthen LT hip, improve balance and stability in gait, decrease fear of falling, improve quality of life, return to long walks, and begin/progress functional strengthening program.    OBJECTIVE IMPAIRMENTS: Abnormal gait, decreased activity tolerance, decreased coordination, decreased endurance, decreased mobility, difficulty walking, decreased ROM, decreased strength, increased fascial restrictions, increased muscle spasms, impaired tone, postural dysfunction, and pain.   ACTIVITY LIMITATIONS: squatting, sleeping, stairs, bed mobility, and locomotion level  PARTICIPATION LIMITATIONS: cleaning, laundry, driving, shopping, and community activity  PERSONAL FACTORS: 3+ comorbidities: medical history  are also affecting patient's functional outcome.   REHAB POTENTIAL: Good  CLINICAL DECISION MAKING: Evolving/moderate complexity  EVALUATION COMPLEXITY: Moderate   GOALS: Goals reviewed with patient? Yes  SHORT TERM GOALS: Target date: 09/21/2023  Pt will be independent with HEP.   Baseline: Goal status: INITIAL  2.  Pt will undergo vestibular evaluation.  Baseline:  Goal status: INITIAL  3.  Pt will report 25% improvement in Lt hip pain.  Baseline:  Goal status: INITIAL   LONG TERM GOALS: Target date: 11/16/2023  Pt will be independent with advanced HEP.   Baseline:  Goal status: INITIAL  2.  Pt will report 0 falls in the two weeks prior to discharge.  Baseline:  Goal status: INITIAL  3.  Pt will report 75% improvement in dizziness.  Baseline:  Goal status: INITIAL  4.  Pt will return to 5 mile walks with dog without unsteadiness or loss of balance.  Baseline:  Goal status: INITIAL  5.  Pt will be able to ascend/descend stairs in house without difficulty or pain.  Baseline:  Goal status: INITIAL  6.  Pt will report Lt hip pain no greater than 2/10.  Baseline:  Goal status: INITIAL  7.  Pt will decrease TUG time by 5 seconds in order to reduce fall risk.  Baseline:  Goal status: INITIAL   PLAN:  PT FREQUENCY: 1-2x/week  PT DURATION: 12 weeks  PLANNED INTERVENTIONS: [id number]- PT Re-evaluation, 97110-Therapeutic exercises, 97530-  Therapeutic activity, [id number]- Neuromuscular re-education, 97535- Self Care, [id number]- Manual therapy, [id number]- Gait training, [phone]- Canalith repositioning, [id number]- Aquatic Therapy, [phone]- Electrical stimulation (unattended), [phone]- Electrical stimulation (manual), [id number]- Vasopneumatic device, [id number]- Ultrasound, [id number]- Traction (mechanical), [id number]- Ionotophoresis 4mg /ml Dexamethasone, Patient/Family education, Balance training, Stair training, Taping, Dry Needling, Joint mobilization, Joint manipulation, Spinal manipulation, Spinal mobilization, Vestibular training, Cryotherapy, and Moist heat  PLAN FOR NEXT SESSION: vestibular evaluation; progress hip strengthening; manual techniques  needed for Lt hip pain   Julio Alm, PT, DPT03/19/2512:48 PM

## 2023-08-24 NOTE — Patient Instructions (Signed)

## 2023-08-24 NOTE — Telephone Encounter (Signed)
 Copied from CRM [id number]. Topic: General - Other >> Aug 24, 2023  3:41 PM [name] wrote: Reason for CRM: pt called and stated that she was at Rehab today and they informed her to call and ask Glenda Bauer for an additional authorization for therapy. Please advise.

## 2023-08-29 ENCOUNTER — Ambulatory Visit: Admitting: Rehabilitative and Restorative Service Providers"

## 2023-08-29 ENCOUNTER — Encounter: Payer: Self-pay | Admitting: Rehabilitative and Restorative Service Providers"

## 2023-08-29 DIAGNOSIS — R42 Dizziness and giddiness: Secondary | ICD-10-CM | POA: Diagnosis not present

## 2023-08-29 DIAGNOSIS — M6281 Muscle weakness (generalized): Secondary | ICD-10-CM

## 2023-08-29 DIAGNOSIS — R2681 Unsteadiness on feet: Secondary | ICD-10-CM | POA: Diagnosis not present

## 2023-08-29 DIAGNOSIS — M62838 Other muscle spasm: Secondary | ICD-10-CM

## 2023-08-29 DIAGNOSIS — M25552 Pain in left hip: Secondary | ICD-10-CM | POA: Diagnosis not present

## 2023-08-29 DIAGNOSIS — M7632 Iliotibial band syndrome, left leg: Secondary | ICD-10-CM | POA: Diagnosis not present

## 2023-08-29 NOTE — Therapy (Signed)
 OUTPATIENT PHYSICAL THERAPY VESTIBULAR TREATMENT NOTE   Patient Name: Glenda Bauer MRN: [id number] DOB:06-28-1950, 73 y.o., female Today's Date: 08/29/2023  END OF SESSION:  PT End of Session - 08/29/23 0925     Visit Number 2    Date for PT Re-Evaluation 11/16/23    Authorization Type [employer] Medicare    Authorization Time Period 08/24/23-10/19/23    Authorization - Visit Number 2    Authorization - Number of Visits 16    Progress Note Due on Visit 10    PT Start Time 0930    PT Stop Time 1010    PT Time Calculation (min) 40 min    Activity Tolerance Patient tolerated treatment well    Behavior During Therapy WFL for tasks assessed/performed             Past Medical History:  Diagnosis Date   Allergy    Anxiety    Asthma    Atrial fibrillation (HCC)    Cancer (HCC)    Melanoma and Mycosis Fungosis   Cataract    Cutaneous T-cell lymphoma (HCC) 2011   Dermatologist treated in [place]; Dr. Maggie Schwalbe   Depression    GERD (gastroesophageal reflux disease)    Heart murmur    Hyperlipidemia    Hypertension    Neuromuscular disorder (HCC)    Osteoarthritis    Sebaceous cyst 2022   right vulva   Thyroid nodule    Urinary tract infection    Past Surgical History:  Procedure Laterality Date   ATRIAL FIBRILLATION ABLATION N/A 05/12/2022   Procedure: ATRIAL FIBRILLATION ABLATION;  Surgeon: Regan Lemming, MD;  Location: [facility];  Service: Cardiovascular;  Laterality: N/A;   Cataracts Bilateral    CHOLECYSTECTOMY  2015   melenoma removal  1979   Sinus Polyps      Patient Active Problem List   Diagnosis Date Noted   Dilation of aorta (HCC) 08/11/2023   Statin intolerance 08/11/2023   Coronary artery disease involving native coronary artery of native heart without angina pectoris 08/11/2023   Hyperlipidemia 01/05/2022   OSA (obstructive sleep apnea) 07/28/2021   Thoracic aortic aneurysm without rupture (HCC) 01/08/2021   Sebaceous cyst 2022    Paroxysmal atrial fibrillation (HCC) 04/03/2020   Hypercoagulable state due to paroxysmal atrial fibrillation (HCC) 04/03/2020   Thyroid nodule    Osteoarthritis    Cyst of knee joint 02/13/2016   Sinusitis 10/30/2015   Asthma with exacerbation 10/30/2015   Sciatica 03/06/2015   Abdominal pain, right upper quadrant 02/12/2015   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  States that she is dizzy when she is walking around, but also in bed when she rolls over towards the right side.  PERTINENT HISTORY: A-fib, abdominal surgery (cholecystectomy), anxiety, asthma, hx melanoma, depression PAIN:  Are you having pain? Yes: NPRS scale: 5/10  Pain location: Lt lateral leg and hip Pain description: aching, tight Aggravating factors: worst  at night, has to change positions; stairs Relieving factors: rest  PRECAUTIONS: Fall  RED FLAGS: None   WEIGHT BEARING RESTRICTIONS: No  FALLS:  Has patient fallen in last 6 months? Yes. Number of falls At least 5 times.  Most recent fall in early March when she was walking the standard size poodle (pt reports that she hit her head during the fall), other falls have occurred when walking the dog and it can pull her off balance.  LIVING ENVIRONMENT: Lives with: lives with their family Lives in: House/apartment Stairs: Yes: Internal: up to  bedroom steps; on right going up Has following equipment at home: None  OCCUPATION: retired  PLOF: Independent  PATIENT GOALS: get back to walking the dog 5 miles a day  NEXT MD VISIT: none scheduled - 6 months  OBJECTIVE:  Note: Objective measures were completed at Evaluation unless otherwise noted. 08/24/23: DIAGNOSTIC FINDINGS: none for this issue   PATIENT SURVEYS:  Eval:  LEFS: 47.5  COGNITION: Overall cognitive status: Within functional limits for tasks assessed     SENSATION: WFL  POSTURE: rounded shoulders, forward head, decreased lumbar lordosis, increased thoracic kyphosis, and posterior pelvic tilt  PALPATION: Significant tenderness throughout Lt glutes, tensor fascia latte, and lateral thigh  LOWER EXTREMITY MMT:  MMT Right eval Left eval  Hip flexion 4 3  Hip extension 3 2  Hip abduction 4 2  Hip adduction 3 3   (Blank rows = not tested)    FUNCTIONAL TESTS:  Eval: Squat: limited descent, unsteady, had to hold table Single leg stance:  Rt: pelvic drop, unable without UE support  Lt: pelvic drop, unable without UE support 5x sit<>stand: 17 seconds  Timed up and go (TUG): 16 seconds  GAIT:  Comments: Unsteady, antalgic Lt LE, decreased bil hip extnesion  VESTIBULAR ASSESSMENT: 08/29/2023: Smooth pursuit:  nystagmus noted with horizontal pursuit, especially towards the right side Gaze stability:  WNL Saccades are WNL Head Thrust:  positive for nystagmus to the right Dix Hallpike: slight positive on the right, but more positive for right upbeating nystagmus on the left                                                                                                                               TODAY'S TREATMENT  DATE: 08/29/2023 Nustep level 3 x5 min with PT present to discuss status Vestibular assessment (see above) Dix Hallpike positive on left, so proceeded with canalith repositioning with Epley Maneuver x2 Education on HEP with  demonstration by PT Pencil pushups x10 Horizontal smooth pursuit x10 Seated long arc quad x10 bilat   DATE: 08/24/23 Manual: Trigger Point Dry Needling  Initial Treatment: Pt instructed on Dry Needling rational, procedures, and possible side effects. Pt instructed to expect mild to moderate muscle soreness later in the day and/or into the next day.  Pt instructed in methods to reduce muscle soreness. Pt instructed to continue prescribed HEP. Patient was educated on  signs and symptoms of infection and other risk factors and advised to seek medical attention should they occur.  Patient verbalized understanding of these instructions and education.   Patient Verbal Consent Given: Yes Education Handout Provided: Yes Muscles Treated: Lt glutes/TFL Electrical Stimulation Performed: No Treatment Response/Outcome: significant twitch response and release  Soft tissue mobilization to Lt posterolateral hip muscles  Neuromuscular re-education: Seated hip adduction 10 x 5 seconds Seated hip abduction 10 x 5 seconds Exercises: Seated piriformis stretch 60 sec bil Supine lower trunk rotation 10x   PATIENT EDUCATION:  Education details: See above Person educated: Patient Education method: Explanation, Demonstration, Tactile cues, Verbal cues, and Handouts Education comprehension: verbalized understanding  HOME EXERCISE PROGRAM: Access Code: [email or web address] URL: [email or web address] Date: 08/29/2023 Prepared by: Clydie Braun Jan Olano  Exercises - Seated Hip Adduction Isometrics with Newman Pies  - 1 x daily - 7 x weekly - 2 sets - 10 reps - 5 hold - Seated Hip Abduction with Resistance  - 1 x daily - 7 x weekly - 2 sets - 10 reps - 5 hold - Supine Piriformis Stretch with Foot on Ground  - 1 x daily - 7 x weekly - 1 sets - 2 reps - 60 seconds hold - Wide Lower Trunk Rotation  - 1 x daily - 7 x weekly - 2 sets - 10 reps - Seated Horizontal Smooth Pursuit  - 1 x daily - 7 x weekly - 2 sets - 10  reps - Seated Gaze Stabilization with Head Rotation  - 1 x daily - 7 x weekly - 2 sets - 30 reps - Pencil Pushups  - 1 x daily - 7 x weekly - 2 sets - 10 reps - Brandt-Daroff Vestibular Exercise  - 1 x daily - 7 x weekly - 3-5 reps  ASSESSMENT:  CLINICAL IMPRESSION: Ms Kemler presents to skilled PT reporting that she has been having her dizziness.  Patient states that she does have dizziness when she rolls over in bed.  States that it started a couple of weeks ago, but also admits that on her last fall with her dog, she struck her head.  Patient with nystagmus with smooth pursuits with positive head thrust.  Patient with positive Gilberto Better, with most symptoms on left side.  Patient with reduced symptoms on second canalith repositioning.  Patient provided with updated HEP to perform at home.  Patient continues to require skilled PT to progress towards goal related activities.  OBJECTIVE IMPAIRMENTS: Abnormal gait, decreased activity tolerance, decreased coordination, decreased endurance, decreased mobility, difficulty walking, decreased ROM, decreased strength, increased fascial restrictions, increased muscle spasms, impaired tone, postural dysfunction, and pain.   ACTIVITY LIMITATIONS: squatting, sleeping, stairs, bed mobility, and locomotion level  PARTICIPATION LIMITATIONS: cleaning, laundry, driving, shopping, and community activity  PERSONAL FACTORS: 3+ comorbidities: medical history  are also affecting patient's functional outcome.   REHAB POTENTIAL: Good  CLINICAL DECISION MAKING: Evolving/moderate complexity  EVALUATION COMPLEXITY: Moderate   GOALS: Goals reviewed with patient? Yes  SHORT TERM GOALS: Target date: 09/21/2023  Pt will be independent with HEP.   Baseline: Goal status: Ongoing  2.  Pt will undergo vestibular evaluation.  Baseline:  Goal status: INITIAL  3.  Pt will report 25% improvement in Lt hip pain.  Baseline:  Goal status: INITIAL   LONG  TERM GOALS: Target date: 11/16/2023  Pt will be independent with advanced HEP.   Baseline:  Goal status: INITIAL  2.  Pt will report 0 falls in the two weeks prior  to discharge.  Baseline:  Goal status: INITIAL  3.  Pt will report 75% improvement in dizziness.  Baseline:  Goal status: INITIAL  4.  Pt will return to 5 mile walks with dog without unsteadiness or loss of balance.  Baseline:  Goal status: INITIAL  5.  Pt will be able to ascend/descend stairs in house without difficulty or pain.  Baseline:  Goal status: INITIAL  6.  Pt will report Lt hip pain no greater than 2/10.  Baseline:  Goal status: INITIAL  7.  Pt will decrease TUG time by 5 seconds in order to reduce fall risk.  Baseline:  Goal status: INITIAL   PLAN:  PT FREQUENCY: 1-2x/week  PT DURATION: 12 weeks  PLANNED INTERVENTIONS: 97164- PT Re-evaluation, 97110-Therapeutic exercises, 97530- Therapeutic activity, [id number]- Neuromuscular re-education, 97535- Self Care, 16109- Manual therapy, [id number]- Gait training, [phone]- Canalith repositioning, [id number]- Aquatic Therapy, U9811- Electrical stimulation (unattended), [phone]- Electrical stimulation (manual), [id number]- Vasopneumatic device, [id number]- Ultrasound, [id number]- Traction (mechanical), [id number]- Ionotophoresis 4mg /ml Dexamethasone, Patient/Family education, Balance training, Stair training, Taping, Dry Needling, Joint mobilization, Joint manipulation, Spinal manipulation, Spinal mobilization, Vestibular training, Cryotherapy, and Moist heat  PLAN FOR NEXT SESSION: Assess and progress HEP as indicated, strengthening, flexibility, manual/dry needling as indicated, vestibular and canalith repositioning as indicated.    Reather Laurence, PT, DPT 08/29/23, 10:16 AM  [facility] [address] [place], [place] [address] Phone # [phone] Fax [phone]

## 2023-08-31 ENCOUNTER — Ambulatory Visit: Admitting: Rehabilitative and Restorative Service Providers"

## 2023-08-31 ENCOUNTER — Encounter: Payer: Self-pay | Admitting: Rehabilitative and Restorative Service Providers"

## 2023-08-31 DIAGNOSIS — M62838 Other muscle spasm: Secondary | ICD-10-CM

## 2023-08-31 DIAGNOSIS — M25552 Pain in left hip: Secondary | ICD-10-CM

## 2023-08-31 DIAGNOSIS — M6281 Muscle weakness (generalized): Secondary | ICD-10-CM

## 2023-08-31 DIAGNOSIS — R2681 Unsteadiness on feet: Secondary | ICD-10-CM | POA: Diagnosis not present

## 2023-08-31 DIAGNOSIS — M7632 Iliotibial band syndrome, left leg: Secondary | ICD-10-CM | POA: Diagnosis not present

## 2023-08-31 DIAGNOSIS — R42 Dizziness and giddiness: Secondary | ICD-10-CM | POA: Diagnosis not present

## 2023-08-31 NOTE — Therapy (Signed)
 OUTPATIENT PHYSICAL THERAPY VESTIBULAR TREATMENT NOTE   Patient Name: Glenda Bauer MRN: [id number] DOB:1950/08/23, 73 y.o., female Today's Date: 08/31/2023  END OF SESSION:  PT End of Session - 08/31/23 0932     Visit Number 3    Date for PT Re-Evaluation 11/16/23    Authorization Type [employer] Medicare    Authorization - Visit Number 3    Authorization - Number of Visits 16    Progress Note Due on Visit 10    PT Start Time 0930    PT Stop Time 1010    PT Time Calculation (min) 40 min    Activity Tolerance Patient tolerated treatment well    Behavior During Therapy WFL for tasks assessed/performed             Past Medical History:  Diagnosis Date   Allergy    Anxiety    Asthma    Atrial fibrillation (HCC)    Cancer (HCC)    Melanoma and Mycosis Fungosis   Cataract    Cutaneous T-cell lymphoma (HCC) 2011   Dermatologist treated in [place]; Dr. Maggie Schwalbe   Depression    GERD (gastroesophageal reflux disease)    Heart murmur    Hyperlipidemia    Hypertension    Neuromuscular disorder (HCC)    Osteoarthritis    Sebaceous cyst 2022   right vulva   Thyroid nodule    Urinary tract infection    Past Surgical History:  Procedure Laterality Date   ATRIAL FIBRILLATION ABLATION N/A 05/12/2022   Procedure: ATRIAL FIBRILLATION ABLATION;  Surgeon: Regan Lemming, MD;  Location: [facility];  Service: Cardiovascular;  Laterality: N/A;   Cataracts Bilateral    CHOLECYSTECTOMY  2015   melenoma removal  1979   Sinus Polyps      Patient Active Problem List   Diagnosis Date Noted   Dilation of aorta (HCC) 08/11/2023   Statin intolerance 08/11/2023   Coronary artery disease involving native coronary artery of native heart without angina pectoris 08/11/2023   Hyperlipidemia 01/05/2022   OSA (obstructive sleep apnea) 07/28/2021   Thoracic aortic aneurysm without rupture (HCC) 01/08/2021   Sebaceous cyst 2022   Paroxysmal atrial fibrillation (HCC)  04/03/2020   Hypercoagulable state due to paroxysmal atrial fibrillation (HCC) 04/03/2020   Thyroid nodule    Osteoarthritis    Cyst of knee joint 02/13/2016   Sinusitis 10/30/2015   Asthma with exacerbation 10/30/2015   Sciatica 03/06/2015   Abdominal pain, right upper quadrant 02/12/2015   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/21/2014   Essential hypertension 05/21/2014   Anxiety and depression 05/21/2014   Asthma, chronic 05/21/2014    PCP: Shirline Frees, NP  REFERRING PROVIDER: Shirline Frees, NP  REFERRING DIAG: R26.81 (ICD-10-CM) - Gait instability M76.32 (ICD-10-CM) - It band syndrome, left  THERAPY DIAG:  Muscle weakness (generalized)  Other muscle spasm  Pain in left hip  Dizziness and giddiness  Unsteadiness on feet  Rationale for Evaluation and Treatment: Rehabilitation  ONSET DATE: 03/08/2023  SUBJECTIVE:   SUBJECTIVE STATEMENT: Pt states that she felt poorly initially after last visit, but then started having less dizziness.  Patient reports that she is still having dizziness today.  Patient states that she did do her exercises yesterday.  PERTINENT HISTORY: A-fib, abdominal surgery (cholecystectomy), anxiety, asthma, hx melanoma, depression PAIN:  Are you having pain? Yes: NPRS scale: 4/10  Pain location: Lt lateral leg and hip Pain description: sore Aggravating factors: worst at night, has  to change positions; stairs Relieving factors: rest  PRECAUTIONS: Fall  RED FLAGS: None   WEIGHT BEARING RESTRICTIONS: No  FALLS:  Has patient fallen in last 6 months? Yes. Number of falls At least 5 times.  Most recent fall in early March when she was walking the standard size poodle (pt reports that she hit her head during the fall), other falls have occurred when walking the dog and it can pull her off balance.  LIVING ENVIRONMENT: Lives with: lives with their family Lives in: House/apartment Stairs: Yes: Internal:  up to bedroom steps; on right going up Has following equipment at home: None  OCCUPATION: retired  PLOF: Independent  PATIENT GOALS: get back to walking the dog 5 miles a day  NEXT MD VISIT: none scheduled - 6 months  OBJECTIVE:  Note: Objective measures were completed at Evaluation unless otherwise noted. 08/24/23: DIAGNOSTIC FINDINGS: none for this issue   PATIENT SURVEYS:  Eval:  LEFS: 47.5  COGNITION: Overall cognitive status: Within functional limits for tasks assessed     SENSATION: WFL  POSTURE: rounded shoulders, forward head, decreased lumbar lordosis, increased thoracic kyphosis, and posterior pelvic tilt  PALPATION: Significant tenderness throughout Lt glutes, tensor fascia latte, and lateral thigh  LOWER EXTREMITY MMT:  MMT Right eval Left eval  Hip flexion 4 3  Hip extension 3 2  Hip abduction 4 2  Hip adduction 3 3   (Blank rows = not tested)    FUNCTIONAL TESTS:  Eval: Squat: limited descent, unsteady, had to hold table Single leg stance:  Rt: pelvic drop, unable without UE support  Lt: pelvic drop, unable without UE support 5x sit<>stand: 17 seconds  Timed up and go (TUG): 16 seconds  GAIT:  Comments: Unsteady, antalgic Lt LE, decreased bil hip extnesion  VESTIBULAR ASSESSMENT: 08/29/2023: Smooth pursuit:  nystagmus noted with horizontal pursuit, especially towards the right side Gaze stability:  WNL Saccades are WNL Head Thrust:  positive for nystagmus to the right Dix Hallpike: slight positive on the right, but more positive for right upbeating nystagmus on the left                                                                                                                               TODAY'S TREATMENT  DATE: 08/31/2023 Nustep level 3 x5 min with PT present to discuss status Seated hip adduction ball squeeze 2x10 Seated hamstring stretch 2x20 sec bilat Seated piriformis stretch 2x20 sec bilat Seated pencil push-ups  2x10 Seated with 1.5# ankle weights:  LAQ, marching, hip ER.  2x10 each bilat Seated hip abduction with red tband 2x10 Dix Hallpike positive on left, so proceeded with canalith repositioning with Epley Maneuver x2   DATE: 08/29/2023 Nustep level 3 x5 min with PT present to discuss status Vestibular assessment (see above) Dix Hallpike positive on left, so proceeded with canalith repositioning with Epley Maneuver x2 Education on HEP with demonstration by PT Pencil pushups x10 Horizontal  smooth pursuit x10 Seated long arc quad x10 bilat   DATE: 08/24/23 Manual: Trigger Point Dry Needling  Initial Treatment: Pt instructed on Dry Needling rational, procedures, and possible side effects. Pt instructed to expect mild to moderate muscle soreness later in the day and/or into the next day.  Pt instructed in methods to reduce muscle soreness. Pt instructed to continue prescribed HEP. Patient was educated on signs and symptoms of infection and other risk factors and advised to seek medical attention should they occur.  Patient verbalized understanding of these instructions and education.   Patient Verbal Consent Given: Yes Education Handout Provided: Yes Muscles Treated: Lt glutes/TFL Electrical Stimulation Performed: No Treatment Response/Outcome: significant twitch response and release  Soft tissue mobilization to Lt posterolateral hip muscles  Neuromuscular re-education: Seated hip adduction 10 x 5 seconds Seated hip abduction 10 x 5 seconds Exercises: Seated piriformis stretch 60 sec bil Supine lower trunk rotation 10x   PATIENT EDUCATION:  Education details: See above Person educated: Patient Education method: Explanation, Demonstration, Tactile cues, Verbal cues, and Handouts Education comprehension: verbalized understanding  HOME EXERCISE PROGRAM: Access Code: [email or web address] URL: [email or web address] Date: 08/29/2023 Prepared by: Clydie Braun Kazuto Sevey  Exercises -  Seated Hip Adduction Isometrics with Newman Pies  - 1 x daily - 7 x weekly - 2 sets - 10 reps - 5 hold - Seated Hip Abduction with Resistance  - 1 x daily - 7 x weekly - 2 sets - 10 reps - 5 hold - Supine Piriformis Stretch with Foot on Ground  - 1 x daily - 7 x weekly - 1 sets - 2 reps - 60 seconds hold - Wide Lower Trunk Rotation  - 1 x daily - 7 x weekly - 2 sets - 10 reps - Seated Horizontal Smooth Pursuit  - 1 x daily - 7 x weekly - 2 sets - 10 reps - Seated Gaze Stabilization with Head Rotation  - 1 x daily - 7 x weekly - 2 sets - 30 reps - Pencil Pushups  - 1 x daily - 7 x weekly - 2 sets - 10 reps - Brandt-Daroff Vestibular Exercise  - 1 x daily - 7 x weekly - 3-5 reps  ASSESSMENT:  CLINICAL IMPRESSION: Ms Schou presents to skilled PT reporting that yesterday, she seemed to have less dizziness, but today, she is still having dizziness.  Patient able to progress with ankle weights during session today.  Patient continues to have positive Gilberto Better during session today, so proceeded with Canalith Repositioning x2.  On 2nd trial of canalith repositioning, patient without increased dizziness or nystagmus noted.  Patient continues to require skilled PT to progress towards goal related activities.  OBJECTIVE IMPAIRMENTS: Abnormal gait, decreased activity tolerance, decreased coordination, decreased endurance, decreased mobility, difficulty walking, decreased ROM, decreased strength, increased fascial restrictions, increased muscle spasms, impaired tone, postural dysfunction, and pain.   ACTIVITY LIMITATIONS: squatting, sleeping, stairs, bed mobility, and locomotion level  PARTICIPATION LIMITATIONS: cleaning, laundry, driving, shopping, and community activity  PERSONAL FACTORS: 3+ comorbidities: medical history  are also affecting patient's functional outcome.   REHAB POTENTIAL: Good  CLINICAL DECISION MAKING: Evolving/moderate complexity  EVALUATION COMPLEXITY:  Moderate   GOALS: Goals reviewed with patient? Yes  SHORT TERM GOALS: Target date: 09/21/2023  Pt will be independent with HEP.   Baseline: Goal status: Met on 08/31/23  2.  Pt will undergo vestibular evaluation.  Baseline:  Goal status: Met on 08/29/23  3.  Pt will report 25% improvement in Lt hip pain.  Baseline:  Goal status: Ongoing   LONG TERM GOALS: Target date: 11/16/2023  Pt will be independent with advanced HEP.   Baseline:  Goal status: INITIAL  2.  Pt will report 0 falls in the two weeks prior to discharge.  Baseline:  Goal status: INITIAL  3.  Pt will report 75% improvement in dizziness.  Baseline:  Goal status: INITIAL  4.  Pt will return to 5 mile walks with dog without unsteadiness or loss of balance.  Baseline:  Goal status: INITIAL  5.  Pt will be able to ascend/descend stairs in house without difficulty or pain.  Baseline:  Goal status: INITIAL  6.  Pt will report Lt hip pain no greater than 2/10.  Baseline:  Goal status: INITIAL  7.  Pt will decrease TUG time by 5 seconds in order to reduce fall risk.  Baseline:  Goal status: INITIAL   PLAN:  PT FREQUENCY: 1-2x/week  PT DURATION: 12 weeks  PLANNED INTERVENTIONS: 97164- PT Re-evaluation, 97110-Therapeutic exercises, 97530- Therapeutic activity, [id number]- Neuromuscular re-education, 97535- Self Care, 16109- Manual therapy, [id number]- Gait training, [phone]- Canalith repositioning, [id number]- Aquatic Therapy, [phone]- Electrical stimulation (unattended), [phone]- Electrical stimulation (manual), [id number]- Vasopneumatic device, [id number]- Ultrasound, [id number]- Traction (mechanical), [id number]- Ionotophoresis 4mg /ml Dexamethasone, Patient/Family education, Balance training, Stair training, Taping, Dry Needling, Joint mobilization, Joint manipulation, Spinal manipulation, Spinal mobilization, Vestibular training, Cryotherapy, and Moist heat  PLAN FOR NEXT SESSION: Assess and progress HEP as indicated, strengthening,  flexibility, manual/dry needling as indicated, vestibular and canalith repositioning as indicated.    Reather Laurence, PT, DPT 08/31/23, 10:20 AM  [facility] [address] [place], [place] [address] Phone # [phone] Fax [phone]

## 2023-09-05 ENCOUNTER — Ambulatory Visit

## 2023-09-05 DIAGNOSIS — R42 Dizziness and giddiness: Secondary | ICD-10-CM | POA: Diagnosis not present

## 2023-09-05 DIAGNOSIS — M7632 Iliotibial band syndrome, left leg: Secondary | ICD-10-CM | POA: Diagnosis not present

## 2023-09-05 DIAGNOSIS — R2681 Unsteadiness on feet: Secondary | ICD-10-CM | POA: Diagnosis not present

## 2023-09-05 DIAGNOSIS — M62838 Other muscle spasm: Secondary | ICD-10-CM

## 2023-09-05 DIAGNOSIS — M25552 Pain in left hip: Secondary | ICD-10-CM

## 2023-09-05 DIAGNOSIS — M6281 Muscle weakness (generalized): Secondary | ICD-10-CM

## 2023-09-05 NOTE — Therapy (Signed)
 OUTPATIENT PHYSICAL THERAPY VESTIBULAR TREATMENT NOTE   Patient Name: Glenda Bauer MRN: [id number] DOB:[birth year], 73 y.o., female Today's Date: 09/05/2023  END OF SESSION:  PT End of Session - 09/05/23 1015     Visit Number 4    Date for PT Re-Evaluation 11/16/23    Authorization Type [employer] Medicare    Authorization Time Period 08/24/23-10/19/23    Authorization - Visit Number 4    Authorization - Number of Visits 16    PT Start Time 0932    PT Stop Time 1015    PT Time Calculation (min) 43 min    Activity Tolerance Patient tolerated treatment well    Behavior During Therapy WFL for tasks assessed/performed              Past Medical History:  Diagnosis Date   Allergy    Anxiety    Asthma    Atrial fibrillation (HCC)    Cancer (HCC)    Melanoma and Mycosis Fungosis   Cataract    Cutaneous T-cell lymphoma (HCC) 2011   Dermatologist treated in [place]; Dr. Maggie Schwalbe   Depression    GERD (gastroesophageal reflux disease)    Heart murmur    Hyperlipidemia    Hypertension    Neuromuscular disorder (HCC)    Osteoarthritis    Sebaceous cyst 2022   right vulva   Thyroid nodule    Urinary tract infection    Past Surgical History:  Procedure Laterality Date   ATRIAL FIBRILLATION ABLATION N/A 05/12/2022   Procedure: ATRIAL FIBRILLATION ABLATION;  Surgeon: Regan Lemming, MD;  Location: [facility];  Service: Cardiovascular;  Laterality: N/A;   Cataracts Bilateral    CHOLECYSTECTOMY  2015   melenoma removal  1979   Sinus Polyps      Patient Active Problem List   Diagnosis Date Noted   Dilation of aorta (HCC) 08/11/2023   Statin intolerance 08/11/2023   Coronary artery disease involving native coronary artery of native heart without angina pectoris 08/11/2023   Hyperlipidemia 01/05/2022   OSA (obstructive sleep apnea) 07/28/2021   Thoracic aortic aneurysm without rupture (HCC) 01/08/2021   Sebaceous cyst 2022   Paroxysmal atrial fibrillation  (HCC) 04/03/2020   Hypercoagulable state due to paroxysmal atrial fibrillation (HCC) 04/03/2020   Thyroid nodule    Osteoarthritis    Cyst of knee joint 02/13/2016   Sinusitis 10/30/2015   Asthma with exacerbation 10/30/2015   Sciatica 03/06/2015   Abdominal pain, right upper quadrant 02/12/2015   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  I'm doing my exercises.  Balance and hip pain are about the same.   PERTINENT HISTORY: A-fib, abdominal surgery (cholecystectomy), anxiety, asthma, hx melanoma, depression PAIN:  Are you having pain? Yes: NPRS scale: 3/10  Pain location: Lt lateral leg and hip Pain description: sore Aggravating factors: worst at night, has to change positions; stairs Relieving factors: rest  PRECAUTIONS: Fall  RED FLAGS: None   WEIGHT BEARING RESTRICTIONS: No  FALLS:  Has patient fallen in last 6 months? Yes. Number of falls At least 5 times.  Most recent fall in early March when she was walking the standard size poodle (pt reports that she hit her head during the fall), other falls have occurred when walking the dog and it can pull her off balance.  LIVING ENVIRONMENT: Lives with: lives with their family Lives in: House/apartment Stairs: Yes: Internal: up to bedroom steps; on right going up Has following equipment at home: None  OCCUPATION: retired  PLOF: Independent  PATIENT GOALS: get  back to walking the dog 5 miles a day  NEXT MD VISIT: none scheduled - 6 months  OBJECTIVE:  Note: Objective measures were completed at Evaluation unless otherwise noted. 08/24/23: DIAGNOSTIC FINDINGS: none for this issue   PATIENT SURVEYS:  Eval:  LEFS: 47.5  COGNITION: Overall cognitive status: Within functional limits for tasks assessed     SENSATION: WFL  POSTURE: rounded shoulders, forward head, decreased lumbar lordosis, increased thoracic kyphosis, and posterior pelvic tilt  PALPATION: Significant tenderness throughout Lt glutes, tensor fascia latte, and lateral thigh  LOWER EXTREMITY MMT:  MMT Right eval Left eval  Hip flexion 4 3  Hip extension 3 2  Hip abduction 4 2  Hip adduction 3 3   (Blank rows = not tested)    FUNCTIONAL TESTS:    09/05/23:  5x sit to stand: 13.05 without hands   Eval: Squat: limited descent, unsteady, had to hold table Single leg stance:  Rt: pelvic drop, unable without UE support  Lt: pelvic drop, unable without UE support 5x sit<>stand: 17 seconds  Timed up and go (TUG): 16 seconds  GAIT:  Comments: Unsteady, antalgic Lt LE, decreased bil hip extnesion  VESTIBULAR ASSESSMENT: 08/29/2023: Smooth pursuit:  nystagmus noted with horizontal pursuit, especially towards the right side Gaze stability:  WNL Saccades are WNL Head Thrust:  positive for nystagmus to the right Dix Hallpike: slight positive on the right, but more positive for right upbeating nystagmus on the left                                                                                                                               TODAY'S TREATMENT  DATE: 09/05/2023 Nustep level 6 x6 min with PT present to discuss status Seated hamstring stretch 2x20 sec bilat Seated piriformis stretch 2x20 sec bilat Seated with 2# ankle weights:  LAQ, marching (standing), hip ER.  2x10 each bilat Seated hip abduction with red tband 2x10 Weight shifting on balance pad: 3  ways x1 min each Sit to stand: 2x10 Step over hurdles with step to gait and use of 1 hand on barre Alternating step taps: 6" 2x10  DATE: 08/31/2023 Nustep level 3 x5 min with PT present to discuss status Seated hip adduction ball squeeze 2x10 Seated hamstring stretch 2x20 sec bilat Seated piriformis stretch 2x20 sec bilat Seated pencil push-ups 2x10 Seated with 1.5# ankle weights:  LAQ, marching,  hip ER.  2x10 each bilat Seated hip abduction with red tband 2x10 Dix Hallpike positive on left, so proceeded with canalith repositioning with Epley Maneuver x2   DATE: 08/29/2023 Nustep level 3 x5 min with PT present to discuss status Vestibular assessment (see above) Dix Hallpike positive on left, so proceeded with canalith repositioning with Epley Maneuver x2 Education on HEP with demonstration by PT Pencil pushups x10 Horizontal smooth pursuit x10 Seated long arc quad x10 bilat  PATIENT EDUCATION:  Education details: See above Person educated: Patient Education method: Explanation, Demonstration, Tactile cues, Verbal cues, and Handouts Education comprehension: verbalized understanding  HOME EXERCISE PROGRAM: Access Code: [email or web address] URL: [email or web address] Date: 08/29/2023 Prepared by: Clydie Braun Menke  Exercises - Seated Hip Adduction Isometrics with Newman Pies  - 1 x daily - 7 x weekly - 2 sets - 10 reps - 5 hold - Seated Hip Abduction with Resistance  - 1 x daily - 7 x weekly - 2 sets - 10 reps - 5 hold - Supine Piriformis Stretch with Foot on Ground  - 1 x daily - 7 x weekly - 1 sets - 2 reps - 60 seconds hold - Wide Lower Trunk Rotation  - 1 x daily - 7 x weekly - 2 sets - 10 reps - Seated Horizontal Smooth Pursuit  - 1 x daily - 7 x weekly - 2 sets - 10 reps - Seated Gaze Stabilization with Head Rotation  - 1 x daily - 7 x weekly - 2 sets - 30 reps - Pencil Pushups  - 1 x daily - 7 x weekly - 2 sets - 10 reps - Brandt-Daroff Vestibular Exercise  - 1 x daily - 7 x  weekly - 3-5 reps  ASSESSMENT:  CLINICAL IMPRESSION: Pt arrives with no dizziness and reports that she feels better overall regarding dizziness.  She continues to exercise at home.  Pt walks 2-3 miles with her dog throughout the day. 5x sit to stand is improved to 13 seconds, indicating reduced falls risk.  PT monitored throughout session for safety and cueing. She did well with some advancement of activity. Patient continues to require skilled PT to progress towards goal related activities.  OBJECTIVE IMPAIRMENTS: Abnormal gait, decreased activity tolerance, decreased coordination, decreased endurance, decreased mobility, difficulty walking, decreased ROM, decreased strength, increased fascial restrictions, increased muscle spasms, impaired tone, postural dysfunction, and pain.   ACTIVITY LIMITATIONS: squatting, sleeping, stairs, bed mobility, and locomotion level  PARTICIPATION LIMITATIONS: cleaning, laundry, driving, shopping, and community activity  PERSONAL FACTORS: 3+ comorbidities: medical history  are also affecting patient's functional outcome.   REHAB POTENTIAL: Good  CLINICAL DECISION MAKING: Evolving/moderate complexity  EVALUATION COMPLEXITY: Moderate   GOALS: Goals reviewed with patient? Yes  SHORT TERM GOALS: Target date: 09/21/2023  Pt will be independent with HEP.   Baseline: Goal status: Met on 08/31/23  2.  Pt will undergo vestibular evaluation.  Baseline:  Goal status: Met on 08/29/23  3.  Pt will report 25% improvement in Lt hip pain.  Baseline:  Goal status: Ongoing   LONG TERM GOALS: Target date: 11/16/2023  Pt will be independent with advanced HEP.   Baseline:  Goal status: INITIAL  2.  Pt will report 0 falls in the two weeks prior to discharge.  Baseline:  Goal status: INITIAL  3.  Pt will report 75% improvement in dizziness.  Baseline:  Goal status: INITIAL  4.  Pt will return to 5 mile walks with dog without unsteadiness or loss of  balance.  Baseline: 2-3 miles over the day (09/05/23) Goal status: INITIAL  5.  Pt will be able to ascend/descend stairs in house without difficulty or pain.  Baseline:  Goal status: INITIAL  6.  Pt will report Lt hip pain no greater than 2/10.  Baseline:  Goal status: INITIAL  7.  Pt will decrease TUG time by 5 seconds in order to reduce fall risk.  Baseline:  Goal status: INITIAL   PLAN:  PT FREQUENCY: 1-2x/week  PT DURATION: 12 weeks  PLANNED INTERVENTIONS: 97164- PT Re-evaluation, 97110-Therapeutic exercises, 97530- Therapeutic activity, [id number]- Neuromuscular re-education, 97535- Self Care, 16109- Manual therapy, [id number]- Gait training, [phone]- Canalith repositioning, [id number]- Aquatic Therapy, U9811- Electrical stimulation (unattended), [id number]- Electrical stimulation (manual), [id number]- Vasopneumatic device, [id number]- Ultrasound, [id number]- Traction (mechanical), [id number]- Ionotophoresis 4mg /ml Dexamethasone, Patient/Family education, Balance training, Stair training, Taping, Dry Needling, Joint mobilization, Joint manipulation, Spinal manipulation, Spinal mobilization, Vestibular training, Cryotherapy, and Moist heat  PLAN FOR NEXT SESSION: Assess and progress HEP to include balance/standing, strengthening, flexibility, manual/dry needling as indicated, vestibular and canalith repositioning as indicated.    Lorrene Reid, PT 09/05/23 10:16 AM   [facility] [address], [address] [place], [place] [address] Phone # [phone] Fax [phone]

## 2023-09-07 ENCOUNTER — Encounter: Payer: Self-pay | Admitting: Rehabilitative and Restorative Service Providers"

## 2023-09-07 ENCOUNTER — Ambulatory Visit: Attending: Adult Health | Admitting: Rehabilitative and Restorative Service Providers"

## 2023-09-07 DIAGNOSIS — R2681 Unsteadiness on feet: Secondary | ICD-10-CM | POA: Insufficient documentation

## 2023-09-07 DIAGNOSIS — M6281 Muscle weakness (generalized): Secondary | ICD-10-CM | POA: Diagnosis not present

## 2023-09-07 DIAGNOSIS — M62838 Other muscle spasm: Secondary | ICD-10-CM | POA: Diagnosis not present

## 2023-09-07 DIAGNOSIS — R42 Dizziness and giddiness: Secondary | ICD-10-CM | POA: Diagnosis not present

## 2023-09-07 DIAGNOSIS — M25552 Pain in left hip: Secondary | ICD-10-CM | POA: Insufficient documentation

## 2023-09-07 NOTE — Therapy (Signed)
 OUTPATIENT PHYSICAL THERAPY VESTIBULAR TREATMENT NOTE   Patient Name: Glenda Bauer MRN: [id number] DOB:1950/11/16, 73 y.o., female Today's Date: 09/07/2023  END OF SESSION:  PT End of Session - 09/07/23 0933     Visit Number 5    Date for PT Re-Evaluation 11/16/23    Authorization Type [employer] Medicare    Authorization Time Period 08/24/23-10/19/23    Authorization - Visit Number 5    Authorization - Number of Visits 16    Progress Note Due on Visit 10    PT Start Time 0930    PT Stop Time 1010    PT Time Calculation (min) 40 min    Activity Tolerance Patient tolerated treatment well    Behavior During Therapy WFL for tasks assessed/performed              Past Medical History:  Diagnosis Date   Allergy    Anxiety    Asthma    Atrial fibrillation (HCC)    Cancer (HCC)    Melanoma and Mycosis Fungosis   Cataract    Cutaneous T-cell lymphoma (HCC) 2011   Dermatologist treated in [place]; Dr. Maggie Schwalbe   Depression    GERD (gastroesophageal reflux disease)    Heart murmur    Hyperlipidemia    Hypertension    Neuromuscular disorder (HCC)    Osteoarthritis    Sebaceous cyst 2022   right vulva   Thyroid nodule    Urinary tract infection    Past Surgical History:  Procedure Laterality Date   ATRIAL FIBRILLATION ABLATION N/A 05/12/2022   Procedure: ATRIAL FIBRILLATION ABLATION;  Surgeon: Regan Lemming, MD;  Location: [facility];  Service: Cardiovascular;  Laterality: N/A;   Cataracts Bilateral    CHOLECYSTECTOMY  2015   melenoma removal  1979   Sinus Polyps      Patient Active Problem List   Diagnosis Date Noted   Dilation of aorta (HCC) 08/11/2023   Statin intolerance 08/11/2023   Coronary artery disease involving native coronary artery of native heart without angina pectoris 08/11/2023   Hyperlipidemia 01/05/2022   OSA (obstructive sleep apnea) 07/28/2021   Thoracic aortic aneurysm without rupture (HCC) 01/08/2021   Sebaceous cyst 2022    Paroxysmal atrial fibrillation (HCC) 04/03/2020   Hypercoagulable state due to paroxysmal atrial fibrillation (HCC) 04/03/2020   Thyroid nodule    Osteoarthritis    Cyst of knee joint 02/13/2016   Sinusitis 10/30/2015   Asthma with exacerbation 10/30/2015   Sciatica 03/06/2015   Abdominal pain, right upper quadrant 02/12/2015   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  PERTINENT HISTORY: A-fib, abdominal surgery (cholecystectomy), anxiety, asthma, hx melanoma, depression PAIN:  Are you having pain? Yes: NPRS scale: 4/10  Pain location: Lt lateral leg and hip Pain description: sore Aggravating factors: worst at night, has to change positions; stairs Relieving factors: rest  PRECAUTIONS: Fall  RED FLAGS: None   WEIGHT BEARING  RESTRICTIONS: No  FALLS:  Has patient fallen in last 6 months? Yes. Number of falls At least 5 times.  Most recent fall in early March when she was walking the standard size poodle (pt reports that she hit her head during the fall), other falls have occurred when walking the dog and it can pull her off balance.  LIVING ENVIRONMENT: Lives with: lives with their family Lives in: House/apartment Stairs: Yes: Internal: up to bedroom steps; on right going up Has following equipment at home: None  OCCUPATION: retired  PLOF:  Independent  PATIENT GOALS: get back to walking the dog 5 miles a day  NEXT MD VISIT: none scheduled - 6 months  OBJECTIVE:  Note: Objective measures were completed at Evaluation unless otherwise noted. 08/24/23: DIAGNOSTIC FINDINGS: none for this issue   PATIENT SURVEYS:  Eval:  LEFS: 47.5  COGNITION: Overall cognitive status: Within functional limits for tasks assessed     SENSATION: WFL  POSTURE: rounded shoulders, forward head, decreased lumbar lordosis, increased thoracic kyphosis, and posterior pelvic tilt  PALPATION: Significant tenderness throughout Lt glutes, tensor fascia latte, and lateral thigh  LOWER EXTREMITY MMT:  MMT Right eval Left eval  Hip flexion 4 3  Hip extension 3 2  Hip abduction 4 2  Hip adduction 3 3   (Blank rows = not tested)    FUNCTIONAL TESTS:    09/05/23:  5x sit to stand: 13.05 without hands   Eval: Squat: limited descent, unsteady, had to hold table Single leg stance:  Rt: pelvic drop, unable without UE support  Lt: pelvic drop, unable without UE support 5x sit<>stand: 17 seconds  Timed up and go (TUG): 16 seconds  GAIT:  Comments: Unsteady, antalgic Lt LE, decreased bil hip extnesion  VESTIBULAR ASSESSMENT: 08/29/2023: Smooth pursuit:  nystagmus noted with horizontal pursuit, especially towards the right side Gaze stability:  WNL Saccades are WNL Head Thrust:  positive for nystagmus to the right Dix Hallpike: slight positive on the right, but more positive for right upbeating nystagmus on the left                                                                                                                               TODAY'S TREATMENT  DATE: 09/07/2023 Nustep level 6 x6 min with PT present to discuss status Supine hamstring stretch with strap 2x20 sec bilat (pt required assist for left LE) Supine IT band stretch with strap 2x20 sec bilat (pt required assist for left LE) Supine hip adductor stretch with strap 2x20  sec bilat Supine hip adduction squeeze with ball 2x10 Supine hip abduction with red tband 2x10 Dix Hallpike with slight positive to the left, proceeded with Epley maneuver x1 [employer] with slight positive to the right, proceeded with Epley maneuver x1 Alt LE toe taps to 6" step 2x10   DATE: 09/05/2023 Nustep level 6 x6 min with PT present to discuss status Seated hamstring stretch 2x20 sec  bilat Seated piriformis stretch 2x20 sec bilat Seated with 2# ankle weights:  LAQ, marching (standing), hip ER.  2x10 each bilat Seated hip abduction with red tband 2x10 Weight shifting on balance pad: 3 ways x1 min each Sit to stand: 2x10 Step over hurdles with step to gait and use of 1 hand on barre Alternating step taps: 6" 2x10   DATE: 08/31/2023 Nustep level 3 x5 min with PT present to discuss status Seated hip adduction ball squeeze 2x10 Seated hamstring stretch 2x20 sec bilat Seated piriformis stretch 2x20 sec bilat Seated pencil push-ups 2x10 Seated with 1.5# ankle weights:  LAQ, marching, hip ER.  2x10 each bilat Seated hip abduction with red tband 2x10 Dix Hallpike positive on left, so proceeded with canalith repositioning with Epley Maneuver x2    PATIENT EDUCATION:  Education details: See above Person educated: Patient Education method: Explanation, Demonstration, Tactile cues, Verbal cues, and Handouts Education comprehension: verbalized understanding  HOME EXERCISE PROGRAM: Access Code: [email or web address] URL: [email or web address] Date: 08/29/2023 Prepared by: Clydie Braun Hamzah Savoca  Exercises - Seated Hip Adduction Isometrics with Newman Pies  - 1 x daily - 7 x weekly - 2 sets - 10 reps - 5 hold - Seated Hip Abduction with Resistance  - 1 x daily - 7 x weekly - 2 sets - 10 reps - 5 hold - Supine Piriformis Stretch with Foot on Ground  - 1 x daily - 7 x weekly - 1 sets - 2 reps - 60 seconds hold - Wide Lower Trunk Rotation  - 1 x daily - 7 x weekly - 2 sets - 10 reps - Seated  Horizontal Smooth Pursuit  - 1 x daily - 7 x weekly - 2 sets - 10 reps - Seated Gaze Stabilization with Head Rotation  - 1 x daily - 7 x weekly - 2 sets - 30 reps - Pencil Pushups  - 1 x daily - 7 x weekly - 2 sets - 10 reps - Brandt-Daroff Vestibular Exercise  - 1 x daily - 7 x weekly - 3-5 reps  ASSESSMENT:  CLINICAL IMPRESSION: Ms Wilhoite presents to skilled PT reporting that she is having some occasional dizziness.  Patient additionally reports increased tightness in her IT band area.  Assisted patient with providing LE stretching today with noting increased tightness on left LE and requiring assist for stretch.  Patient continues with positive Gilberto Better, so proceeded with Epley maneuver for canalith repositioning.  Patient continues with difficulty with alt LE to tap exercise onto 6" step and requires close SBA for safety.  Educated pt that if she performs this at home, she needs to be in a corner to improve safety.  Patient continues to require skilled PT to progress with goal related activities.  OBJECTIVE IMPAIRMENTS: Abnormal gait, decreased activity tolerance, decreased coordination, decreased endurance, decreased mobility, difficulty walking, decreased ROM, decreased strength, increased fascial restrictions, increased muscle spasms, impaired tone, postural dysfunction, and pain.   ACTIVITY LIMITATIONS: squatting, sleeping, stairs, bed mobility, and locomotion level  PARTICIPATION LIMITATIONS: cleaning, laundry, driving, shopping, and community activity  PERSONAL FACTORS: 3+ comorbidities: medical history  are also affecting patient's functional outcome.   REHAB POTENTIAL: Good  CLINICAL DECISION MAKING: Evolving/moderate complexity  EVALUATION COMPLEXITY: Moderate   GOALS: Goals reviewed with patient? Yes  SHORT TERM GOALS: Target date: 09/21/2023  Pt will be independent with HEP.   Baseline: Goal status: Met on 08/31/23  2.  Pt will undergo vestibular evaluation.   Baseline:  Goal status: Met on 08/29/23  3.  Pt will report 25% improvement in Lt hip pain.  Baseline:  Goal status: Ongoing   LONG TERM GOALS: Target date: 11/16/2023  Pt will be independent with advanced HEP.   Baseline:  Goal status: INITIAL  2.  Pt will report 0 falls in the two weeks prior to discharge.  Baseline:  Goal status: INITIAL  3.  Pt will report 75% improvement in dizziness.  Baseline:  Goal status: INITIAL  4.  Pt will return to 5 mile walks with dog without unsteadiness or loss of balance.  Baseline: 2-3 miles over the day (09/05/23) Goal status: INITIAL  5.  Pt will be able to ascend/descend stairs in house without difficulty or pain.  Baseline:  Goal status: INITIAL  6.  Pt will report Lt hip pain no greater than 2/10.  Baseline:  Goal status: INITIAL  7.  Pt will decrease TUG time by 5 seconds in order to reduce fall risk.  Baseline:  Goal status: INITIAL   PLAN:  PT FREQUENCY: 1-2x/week  PT DURATION: 12 weeks  PLANNED INTERVENTIONS: 97164- PT Re-evaluation, 97110-Therapeutic exercises, 97530- Therapeutic activity, [id number]- Neuromuscular re-education, 97535- Self Care, 16109- Manual therapy, [id number]- Gait training, [phone]- Canalith repositioning, [id number]- Aquatic Therapy, U9811- Electrical stimulation (unattended), [phone]- Electrical stimulation (manual), [id number]- Vasopneumatic device, [id number]- Ultrasound, [id number]- Traction (mechanical), [id number]- Ionotophoresis 4mg /ml Dexamethasone, Patient/Family education, Balance training, Stair training, Taping, Dry Needling, Joint mobilization, Joint manipulation, Spinal manipulation, Spinal mobilization, Vestibular training, Cryotherapy, and Moist heat  PLAN FOR NEXT SESSION: Assess and progress HEP to include balance/standing, strengthening, flexibility, manual/dry needling as indicated, vestibular and canalith repositioning as indicated.    Reather Laurence, PT, DPT 09/07/23, 10:17 AM  [facility] [address], [address] [place], [place] [address] Phone # [phone] Fax [phone]

## 2023-09-14 ENCOUNTER — Ambulatory Visit: Admitting: Rehabilitative and Restorative Service Providers"

## 2023-09-14 ENCOUNTER — Encounter: Payer: Self-pay | Admitting: Rehabilitative and Restorative Service Providers"

## 2023-09-14 DIAGNOSIS — R2681 Unsteadiness on feet: Secondary | ICD-10-CM | POA: Diagnosis not present

## 2023-09-14 DIAGNOSIS — R42 Dizziness and giddiness: Secondary | ICD-10-CM | POA: Diagnosis not present

## 2023-09-14 DIAGNOSIS — M6281 Muscle weakness (generalized): Secondary | ICD-10-CM | POA: Diagnosis not present

## 2023-09-14 DIAGNOSIS — M62838 Other muscle spasm: Secondary | ICD-10-CM | POA: Diagnosis not present

## 2023-09-14 DIAGNOSIS — M25552 Pain in left hip: Secondary | ICD-10-CM | POA: Diagnosis not present

## 2023-09-14 NOTE — Therapy (Signed)
 OUTPATIENT PHYSICAL THERAPY VESTIBULAR TREATMENT NOTE   Patient Name: Glenda Bauer MRN: [id number] DOB:July 20, 1950, 73 y.o., female Today's Date: 09/14/2023  END OF SESSION:  PT End of Session - 09/14/23 0848     Visit Number 6    Date for PT Re-Evaluation 11/16/23    Authorization Type [employer]    Authorization Time Period 08/24/23-10/19/23    Authorization - Visit Number 6    Authorization - Number of Visits 16    Progress Note Due on Visit 10    PT Start Time 0846    PT Stop Time 0925    PT Time Calculation (min) 39 min    Activity Tolerance Patient tolerated treatment well    Behavior During Therapy [facility] for tasks assessed/performed              Past Medical History:  Diagnosis Date   Allergy    Anxiety    Asthma    Atrial fibrillation (HCC)    Cancer (HCC)    Melanoma and Mycosis Fungosis   Cataract    Cutaneous T-cell lymphoma (HCC) 2011   Dermatologist treated in [place]; Dr. Maggie Schwalbe   Depression    GERD (gastroesophageal reflux disease)    Heart murmur    Hyperlipidemia    Hypertension    Neuromuscular disorder (HCC)    Osteoarthritis    Sebaceous cyst 2022   right vulva   Thyroid nodule    Urinary tract infection    Past Surgical History:  Procedure Laterality Date   ATRIAL FIBRILLATION ABLATION N/A 05/12/2022   Procedure: ATRIAL FIBRILLATION ABLATION;  Surgeon: Regan Lemming, MD;  Location: [facility];  Service: Cardiovascular;  Laterality: N/A;   Cataracts Bilateral    CHOLECYSTECTOMY  2015   melenoma removal  1979   Sinus Polyps      Patient Active Problem List   Diagnosis Date Noted   Dilation of aorta (HCC) 08/11/2023   Statin intolerance 08/11/2023   Coronary artery disease involving native coronary artery of native heart without angina pectoris 08/11/2023   Hyperlipidemia 01/05/2022   OSA (obstructive sleep apnea) 07/28/2021   Thoracic aortic aneurysm without rupture (HCC) 01/08/2021   Sebaceous cyst 2022    Paroxysmal atrial fibrillation (HCC) 04/03/2020   Hypercoagulable state due to paroxysmal atrial fibrillation (HCC) 04/03/2020   Thyroid nodule    Osteoarthritis    Cyst of knee joint 02/13/2016   Sinusitis 10/30/2015   Asthma with exacerbation 10/30/2015   Sciatica 03/06/2015   Abdominal pain, right upper quadrant 02/12/2015   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  Patient reports that her dizziness is 90% better since starting PT.  Patient reports that she has more confidence with her walking now.  PERTINENT HISTORY: A-fib, abdominal surgery (cholecystectomy), anxiety, asthma, hx melanoma, depression PAIN:  Are you having pain? Yes: NPRS scale: 3/10  Pain location: Lt lateral leg and hip Pain description: sore Aggravating factors:  worst at night, has to change positions; stairs Relieving factors: rest  PRECAUTIONS: Fall  RED FLAGS: None   WEIGHT BEARING RESTRICTIONS: No  FALLS:  Has patient fallen in last 6 months? Yes. Number of falls At least 5 times.  Most recent fall in early March when she was walking the standard size poodle (pt reports that she hit her head during the fall), other falls have occurred when walking the dog and it can pull her off balance.  LIVING ENVIRONMENT: Lives with: lives with their family Lives in: House/apartment Stairs:  Yes: Internal: up to bedroom steps; on right going up Has following equipment at home: None  OCCUPATION: retired  PLOF: Independent  PATIENT GOALS: get back to walking the dog 5 miles a day  NEXT MD VISIT: none scheduled - 6 months  OBJECTIVE:  Note: Objective measures were completed at Evaluation unless otherwise noted. 08/24/23: DIAGNOSTIC FINDINGS: none for this issue   PATIENT SURVEYS:  Eval:  LEFS: 47.5  COGNITION: Overall cognitive status: Within functional limits for tasks assessed     SENSATION: WFL  POSTURE: rounded shoulders, forward head, decreased lumbar lordosis, increased thoracic kyphosis, and posterior pelvic tilt  PALPATION: Significant tenderness throughout Lt glutes, tensor fascia latte, and lateral thigh  LOWER EXTREMITY MMT:  MMT Right eval Left eval  Hip flexion 4 3  Hip extension 3 2  Hip abduction 4 2  Hip adduction 3 3   (Blank rows = not tested)    FUNCTIONAL TESTS:   Eval: Squat: limited descent, unsteady, had to hold table Single leg stance:  Rt: pelvic drop, unable without UE support  Lt: pelvic drop, unable without UE support 5x sit<>stand: 17 seconds  Timed up and go (TUG): 16 seconds  09/05/23:  5x sit to stand: 13.05 without hands    GAIT:  Comments: Unsteady, antalgic Lt LE, decreased bil hip extnesion  VESTIBULAR ASSESSMENT: 08/29/2023: Smooth pursuit:  nystagmus noted with horizontal pursuit, especially towards the right side Gaze stability:  WNL Saccades are WNL Head Thrust:  positive for nystagmus to the right Dix Hallpike: slight positive on the right, but more positive for right upbeating nystagmus on the left                                                                                                                               TODAY'S TREATMENT  DATE: 09/14/2023 Nustep level 6 x6 min with PT present to discuss status Seated hamstring stretch 2x20 sec bilat Seated piriformis stretch 2x20 sec  bilat Seated with 2.5# ankle weights:  LAQ, marching, hip ER.  2x10 each bilat Ambulation to cancer gym and back with 2.5# ankle weights  Ambulation down PT hallway with head nods, then head turns x2 laps each Standing at barre:  marching, hip abduction, hip extension.  2x10 each bilat Standing on wobble board x1 min   DATE: 09/07/2023 Nustep level 6 x6 min with PT present to discuss status  Supine hamstring stretch with strap 2x20 sec bilat (pt required assist for left LE) Supine IT band stretch with strap 2x20 sec bilat (pt required assist for left LE) Supine hip adductor stretch with strap 2x20 sec bilat Supine hip adduction squeeze with ball 2x10 Supine hip abduction with red tband 2x10 Dix Hallpike with slight positive to the left, proceeded with Epley maneuver x1 [employer] with slight positive to the right, proceeded with Epley maneuver x1 Alt LE toe taps to 6" step 2x10   DATE: 09/05/2023 Nustep level 6 x6 min with PT present to discuss status Seated hamstring stretch 2x20 sec bilat Seated piriformis stretch 2x20 sec bilat Seated with 2# ankle weights:  LAQ, marching (standing), hip ER.  2x10 each bilat Seated hip abduction with red tband 2x10 Weight shifting on balance pad: 3 ways x1 min each Sit to stand: 2x10 Step over hurdles with step to gait and use of 1 hand on barre Alternating step taps: 6" 2x10   PATIENT EDUCATION:  Education details: See above Person educated: Patient Education method: Explanation, Demonstration, Tactile cues, Verbal cues, and Handouts Education comprehension: verbalized understanding  HOME EXERCISE PROGRAM: Access Code: [email or web address] URL: [email or web address] Date: 09/14/2023 Prepared by: Clydie Braun Karliah Kowalchuk  Exercises - Seated Hip Adduction Isometrics with Newman Pies  - 1 x daily - 7 x weekly - 2 sets - 10 reps - 5 hold - Seated Hip Abduction with Resistance  - 1 x daily - 7 x weekly - 2 sets - 10 reps - 5 hold - Wide Lower Trunk  Rotation  - 1 x daily - 7 x weekly - 2 sets - 10 reps - Seated Horizontal Smooth Pursuit  - 1 x daily - 7 x weekly - 2 sets - 10 reps - Seated Gaze Stabilization with Head Rotation  - 1 x daily - 7 x weekly - 2 sets - 30 reps - Pencil Pushups  - 1 x daily - 7 x weekly - 2 sets - 10 reps - Brandt-Daroff Vestibular Exercise  - 1 x daily - 7 x weekly - 3-5 reps - Seated Hamstring Stretch  - 1 x daily - 7 x weekly - 2 reps - 20 sec hold - Seated Piriformis Stretch  - 1 x daily - 7 x weekly - 2 reps - 20 sec hold - Standing March with Counter Support  - 1 x daily - 7 x weekly - 2 sets - 10 reps - Standing Hip Abduction with Counter Support  - 1 x daily - 7 x weekly - 2 sets - 10 reps - Standing Hip Extension with Counter Support  - 1 x daily - 7 x weekly - 2 sets - 10 reps  ASSESSMENT:  CLINICAL IMPRESSION: Ms Barnard presents to skilled PT reporting that she is feeling 90% better with her dizziness.  States that her dog is still getting used to the new collar, but that he isn't pulling her like he did before.  Patient does not report any new falls.  Patient able to progress with weights during seated exercises.  Provided guidance with stretching, as patient had been reporting pain with stretches at home.  Patient with good return demonstration.  Patient provided with updated HEP.  Patient continues to require skilled PT to progress towards goal related activities.  OBJECTIVE IMPAIRMENTS: Abnormal gait, decreased activity tolerance, decreased coordination, decreased endurance, decreased mobility, difficulty walking, decreased ROM, decreased strength, increased fascial restrictions, increased muscle spasms, impaired tone, postural dysfunction, and pain.   ACTIVITY LIMITATIONS:  squatting, sleeping, stairs, bed mobility, and locomotion level  PARTICIPATION LIMITATIONS: cleaning, laundry, driving, shopping, and community activity  PERSONAL FACTORS: 3+ comorbidities: medical history  are also  affecting patient's functional outcome.   REHAB POTENTIAL: Good  CLINICAL DECISION MAKING: Evolving/moderate complexity  EVALUATION COMPLEXITY: Moderate   GOALS: Goals reviewed with patient? Yes  SHORT TERM GOALS: Target date: 09/21/2023  Pt will be independent with HEP.   Baseline: Goal status: Met on 08/31/23  2.  Pt will undergo vestibular evaluation.  Baseline:  Goal status: Met on 08/29/23  3.  Pt will report 25% improvement in Lt hip pain.  Baseline:  Goal status: Ongoing   LONG TERM GOALS: Target date: 11/16/2023  Pt will be independent with advanced HEP.   Baseline:  Goal status: INITIAL  2.  Pt will report 0 falls in the two weeks prior to discharge.  Baseline:  Goal status: INITIAL  3.  Pt will report 75% improvement in dizziness.  Baseline:  Goal status: Met on 09/14/2023  4.  Pt will return to 5 mile walks with dog without unsteadiness or loss of balance.  Baseline: 2-3 miles over the day (09/05/23) Goal status: Ongoing  5.  Pt will be able to ascend/descend stairs in house without difficulty or pain.  Baseline:  Goal status: INITIAL  6.  Pt will report Lt hip pain no greater than 2/10.  Baseline:  Goal status: INITIAL  7.  Pt will decrease TUG time by 5 seconds in order to reduce fall risk.  Baseline:  Goal status: INITIAL   PLAN:  PT FREQUENCY: 1-2x/week  PT DURATION: 12 weeks  PLANNED INTERVENTIONS: 97164- PT Re-evaluation, 97110-Therapeutic exercises, 97530- Therapeutic activity, [id number]- Neuromuscular re-education, 97535- Self Care, 16109- Manual therapy, [id number]- Gait training, [phone]- Canalith repositioning, [id number]- Aquatic Therapy, U9811- Electrical stimulation (unattended), [phone]- Electrical stimulation (manual), [id number]- Vasopneumatic device, [id number]- Ultrasound, [id number]- Traction (mechanical), [id number]- Ionotophoresis 4mg /ml Dexamethasone, Patient/Family education, Balance training, Stair training, Taping, Dry Needling, Joint mobilization, Joint  manipulation, Spinal manipulation, Spinal mobilization, Vestibular training, Cryotherapy, and Moist heat  PLAN FOR NEXT SESSION: Assess and progress HEP to include balance/standing, strengthening, flexibility, manual/dry needling as indicated, vestibular and canalith repositioning as indicated.    Reather Laurence, PT, DPT 09/14/23, 9:31 AM  [facility] [address], [address] [place], [place] [address] Phone # [phone] Fax [phone]

## 2023-09-16 ENCOUNTER — Ambulatory Visit: Admitting: Rehabilitative and Restorative Service Providers"

## 2023-09-20 ENCOUNTER — Encounter: Payer: Self-pay | Admitting: Rehabilitative and Restorative Service Providers"

## 2023-09-20 ENCOUNTER — Ambulatory Visit: Admitting: Rehabilitative and Restorative Service Providers"

## 2023-09-20 DIAGNOSIS — M6281 Muscle weakness (generalized): Secondary | ICD-10-CM

## 2023-09-20 DIAGNOSIS — R42 Dizziness and giddiness: Secondary | ICD-10-CM | POA: Diagnosis not present

## 2023-09-20 DIAGNOSIS — M25552 Pain in left hip: Secondary | ICD-10-CM

## 2023-09-20 DIAGNOSIS — R2681 Unsteadiness on feet: Secondary | ICD-10-CM | POA: Diagnosis not present

## 2023-09-20 DIAGNOSIS — Z79899 Other long term (current) drug therapy: Secondary | ICD-10-CM | POA: Diagnosis not present

## 2023-09-20 DIAGNOSIS — I10 Essential (primary) hypertension: Secondary | ICD-10-CM | POA: Diagnosis not present

## 2023-09-20 DIAGNOSIS — M62838 Other muscle spasm: Secondary | ICD-10-CM | POA: Diagnosis not present

## 2023-09-20 LAB — BASIC METABOLIC PANEL WITH GFR
BUN/Creatinine Ratio: 27 (ref 12–28)
BUN: 25 mg/dL (ref 8–27)
CO2: 21 mmol/L (ref 20–29)
Calcium: 9.6 mg/dL (ref 8.7–10.3)
Chloride: 103 mmol/L (ref 96–106)
Creatinine, Ser: 0.94 mg/dL (ref 0.57–1.00)
Glucose: 100 mg/dL — ABNORMAL HIGH (ref 70–99)
Potassium: 4.2 mmol/L (ref 3.5–5.2)
Sodium: 143 mmol/L (ref 134–144)
eGFR: 64 mL/min/{1.73_m2} (ref >59)

## 2023-09-20 NOTE — Therapy (Signed)
 OUTPATIENT PHYSICAL THERAPY TREATMENT NOTE   Patient Name: Glenda Bauer MRN: [id number] DOB:[birth year], 73 y.o., female Today's Date: 09/20/2023  END OF SESSION:  PT End of Session - 09/20/23 0935     Visit Number 7    Date for PT Re-Evaluation 11/16/23    Authorization Type [employer]    Authorization Time Period 08/24/23-10/19/23    Authorization - Visit Number 7    Authorization - Number of Visits 16    Progress Note Due on Visit 10    PT Start Time 0930    PT Stop Time 1010    PT Time Calculation (min) 40 min    Activity Tolerance Patient tolerated treatment well    Behavior During Therapy WFL for tasks assessed/performed              Past Medical History:  Diagnosis Date   Allergy    Anxiety    Asthma    Atrial fibrillation (HCC)    Cancer (HCC)    Melanoma and Mycosis Fungosis   Cataract    Cutaneous T-cell lymphoma (HCC) 2011   Dermatologist treated in [place]; Dr. Maggie Schwalbe   Depression    GERD (gastroesophageal reflux disease)    Heart murmur    Hyperlipidemia    Hypertension    Neuromuscular disorder (HCC)    Osteoarthritis    Sebaceous cyst 2022   right vulva   Thyroid nodule    Urinary tract infection    Past Surgical History:  Procedure Laterality Date   ATRIAL FIBRILLATION ABLATION N/A 05/12/2022   Procedure: ATRIAL FIBRILLATION ABLATION;  Surgeon: Regan Lemming, MD;  Location: [facility];  Service: Cardiovascular;  Laterality: N/A;   Cataracts Bilateral    CHOLECYSTECTOMY  2015   melenoma removal  1979   Sinus Polyps      Patient Active Problem List   Diagnosis Date Noted   Dilation of aorta (HCC) 08/11/2023   Statin intolerance 08/11/2023   Coronary artery disease involving native coronary artery of native heart without angina pectoris 08/11/2023   Hyperlipidemia 01/05/2022   OSA (obstructive sleep apnea) 07/28/2021   Thoracic aortic aneurysm without rupture (HCC) 01/08/2021   Sebaceous cyst 2022    Paroxysmal atrial fibrillation (HCC) 04/03/2020   Hypercoagulable state due to paroxysmal atrial fibrillation (HCC) 04/03/2020   Thyroid nodule    Osteoarthritis    Cyst of knee joint 02/13/2016   Sinusitis 10/30/2015   Asthma with exacerbation 10/30/2015   Sciatica 03/06/2015   Abdominal pain, right upper quadrant 02/12/2015   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  States that she still has feelings imbalance that she attributes more to poor balance than dizziness.  Patient reports that her hip is feeling at least 75% better since starting PT.  PERTINENT HISTORY: A-fib, abdominal surgery (cholecystectomy), anxiety, asthma, hx melanoma, depression PAIN:  Are you having pain? Yes: NPRS scale: 2-3/10  Pain location: Lt lateral leg  and hip Pain description: sore Aggravating factors: worst at night, has to change positions; stairs Relieving factors: rest  PRECAUTIONS: Fall  RED FLAGS: None   WEIGHT BEARING RESTRICTIONS: No  FALLS:  Has patient fallen in last 6 months? Yes. Number of falls At least 5 times.  Most recent fall in early March when she was walking the standard size poodle (pt reports that she hit her head during the fall), other falls have occurred when walking the dog and it can pull her off balance.  LIVING ENVIRONMENT: Lives with: lives with  their family Lives in: House/apartment Stairs: Yes: Internal: up to bedroom steps; on right going up Has following equipment at home: None  OCCUPATION: retired  PLOF: Independent  PATIENT GOALS: get back to walking the dog 5 miles a day  NEXT MD VISIT: none scheduled - 6 months  OBJECTIVE:  Note: Objective measures were completed at Evaluation unless otherwise noted. 08/24/23: DIAGNOSTIC FINDINGS: none for this issue   PATIENT SURVEYS:  Eval:  LEFS: 47.5  COGNITION: Overall cognitive status: Within functional limits for tasks assessed     SENSATION: WFL  POSTURE: rounded shoulders, forward head, decreased lumbar lordosis, increased thoracic kyphosis, and posterior pelvic tilt  PALPATION: Significant tenderness throughout Lt glutes, tensor fascia latte, and lateral thigh  LOWER EXTREMITY MMT:  MMT Right eval Left eval  Hip flexion 4 3  Hip extension 3 2  Hip abduction 4 2  Hip adduction 3 3   (Blank rows = not tested)    FUNCTIONAL TESTS:   Eval: Squat: limited descent, unsteady, had to hold table Single leg stance:  Rt: pelvic drop, unable without UE support  Lt: pelvic drop, unable without UE support 5x sit<>stand: 17 seconds  Timed up and go (TUG): 16 seconds  09/05/23:  5x sit to stand: 13.05 without hands   09/20/2023: Timed up and go (TUG): 7.73 sec 5 times sit to/from stand:  10.34 sec Single leg stance:  right - 6.20 sec, left- 4.30 sec   GAIT:  Comments: Unsteady, antalgic Lt LE, decreased bil hip extnesion  VESTIBULAR ASSESSMENT: 08/29/2023: Smooth pursuit:  nystagmus noted with horizontal pursuit, especially towards the right side Gaze stability:  WNL Saccades are WNL Head Thrust:  positive for nystagmus to the right Dix Hallpike: slight positive on the right, but more positive for right upbeating nystagmus on the left                                                                                                                                TODAY'S TREATMENT  DATE: 09/20/2023 Nustep level 5 x6 min with PT present to discuss status 5 times sit to stand and TUG Single leg stance with UE support as needed on flat side of half foam roll 2x30 sec bilat Seated with 2.5# ankle weights:  LAQ, marching, hip ER.  2x10 each bilat Ambulation to cancer gym and back with 2.5# ankle weights  for 2 laps without a break Ambulation down PT hallway with head nods, then head turns x2 laps each Standing on foam pad performing ball toss x20 throws Alt LE toe taps to 8" step 2x10   DATE: 09/14/2023 Nustep level 6 x6 min with PT present to discuss status Seated hamstring stretch 2x20 sec bilat Seated piriformis stretch 2x20 sec bilat Seated with 2.5# ankle weights:  LAQ, marching, hip ER.  2x10 each bilat Ambulation to cancer gym and back with 2.5# ankle weights  Ambulation down PT hallway with head nods, then head turns x2 laps each Standing at barre:  marching, hip abduction, hip extension.  2x10 each bilat Standing on wobble board x1 min   DATE: 09/07/2023 Nustep level 6 x6 min with PT present to discuss status Supine hamstring stretch with strap 2x20 sec bilat (pt required assist for left LE) Supine IT band stretch with strap 2x20 sec bilat (pt required assist for left LE) Supine hip adductor stretch with strap 2x20 sec bilat Supine hip adduction squeeze with ball 2x10 Supine hip abduction with red tband 2x10 Dix Hallpike with slight positive to the left, proceeded with Epley maneuver x1 Dix Hallpike with slight positive to the right, proceeded with Epley maneuver x1 Alt LE toe taps to 6" step 2x10    PATIENT EDUCATION:  Education details: See above Person educated: Patient Education method: Explanation, Demonstration, Tactile cues, Verbal cues, and Handouts Education comprehension: verbalized understanding  HOME EXERCISE PROGRAM: Access Code: [email or web address] URL: [email or web address] Date: 09/14/2023 Prepared by:  Chaneta Comer Chailyn Racette  Exercises - Seated Hip Adduction Isometrics with Celeste Cola  - 1 x daily - 7 x weekly - 2 sets - 10 reps - 5 hold - Seated Hip Abduction with Resistance  - 1 x daily - 7 x weekly - 2 sets - 10 reps - 5 hold - Wide Lower Trunk Rotation  - 1 x daily - 7 x weekly - 2 sets - 10 reps - Seated Horizontal Smooth Pursuit  - 1 x daily - 7 x weekly - 2 sets - 10 reps - Seated Gaze Stabilization with Head Rotation  - 1 x daily - 7 x weekly - 2 sets - 30 reps - Pencil Pushups  - 1 x daily - 7 x weekly - 2 sets - 10 reps - Brandt-Daroff Vestibular Exercise  - 1 x daily - 7 x weekly - 3-5 reps - Seated Hamstring Stretch  - 1 x daily - 7 x weekly - 2 reps - 20 sec hold - Seated Piriformis Stretch  - 1 x daily - 7 x weekly - 2 reps - 20 sec hold - Standing March with Counter Support  - 1 x daily - 7 x weekly - 2 sets - 10 reps - Standing Hip Abduction with Counter Support  - 1 x daily - 7 x weekly - 2 sets - 10 reps - Standing Hip Extension with Counter Support  - 1 x daily - 7 x weekly - 2 sets - 10 reps  ASSESSMENT:  CLINICAL IMPRESSION: Ms Iyer presents to skilled PT reporting that she continues to feel better with less dizziness and improved hip pain.  Patient states that she is able to walk her dog with less pain, as he is pulling less with the new collar.  Patient has made great progress with improved time on 5 times sit to stand and TUG.  Patient able to perform single leg stance timed assessment today, as she was unable at evaluation.  Patient  continues to require skilled PT to address her functional impairments and decrease her risk of falling.  OBJECTIVE IMPAIRMENTS: Abnormal gait, decreased activity tolerance, decreased coordination, decreased endurance, decreased mobility, difficulty walking, decreased ROM, decreased strength, increased fascial restrictions, increased muscle spasms, impaired tone, postural dysfunction, and pain.   ACTIVITY LIMITATIONS: squatting, sleeping, stairs,  bed mobility, and locomotion level  PARTICIPATION LIMITATIONS: cleaning, laundry, driving, shopping, and community activity  PERSONAL FACTORS: 3+ comorbidities: medical history  are also affecting patient's functional outcome.   REHAB POTENTIAL: Good  CLINICAL DECISION MAKING: Evolving/moderate complexity  EVALUATION COMPLEXITY: Moderate   GOALS: Goals reviewed with patient? Yes  SHORT TERM GOALS: Target date: 09/21/2023  Pt will be independent with HEP.   Baseline: Goal status: Met on 08/31/23  2.  Pt will undergo vestibular evaluation.  Baseline:  Goal status: Met on 08/29/23  3.  Pt will report 25% improvement in Lt hip pain.  Baseline:  Goal status: Met on 09/20/23   LONG TERM GOALS: Target date: 11/16/2023  Pt will be independent with advanced HEP.   Baseline:  Goal status: Ongoing  2.  Pt will report 0 falls in the two weeks prior to discharge.  Baseline:  Goal status: Ongoing  3.  Pt will report 75% improvement in dizziness.  Baseline:  Goal status: Met on 09/14/2023  4.  Pt will return to 5 mile walks with dog without unsteadiness or loss of balance.  Baseline: 2-3 miles over the day (09/05/23) Goal status: Ongoing  5.  Pt will be able to ascend/descend stairs in house without difficulty or pain.  Baseline:  Goal status: Ongoing  6.  Pt will report Lt hip pain no greater than 2/10.  Baseline:  Goal status: Ongoing  7.  Pt will decrease TUG time by 5 seconds in order to reduce fall risk.  Baseline:  Goal status: Met on 09/20/2023   PLAN:  PT FREQUENCY: 1-2x/week  PT DURATION: 12 weeks  PLANNED INTERVENTIONS: 97164- PT Re-evaluation, 97110-Therapeutic exercises, 97530- Therapeutic activity, 97112- Neuromuscular re-education, 97535- Self Care, 16109- Manual therapy, [id number]- Gait training, [phone]- Canalith repositioning, [id number]- Aquatic Therapy, [phone]- Electrical stimulation (unattended), [phone]- Electrical stimulation (manual), [id number]- Vasopneumatic  device, [id number]- Ultrasound, [id number]- Traction (mechanical), [id number]- Ionotophoresis 4mg /ml Dexamethasone, Patient/Family education, Balance training, Stair training, Taping, Dry Needling, Joint mobilization, Joint manipulation, Spinal manipulation, Spinal mobilization, Vestibular training, Cryotherapy, and Moist heat  PLAN FOR NEXT SESSION: Assess and progress HEP to include balance/standing, strengthening, flexibility, manual/dry needling as indicated, vestibular and canalith repositioning as indicated.    Robyne Christen, PT, DPT 09/20/23, 10:15 AM  [facility] [address], [address] [place], [place] [address] Phone # [phone] Fax [phone]

## 2023-09-23 ENCOUNTER — Encounter (HOSPITAL_BASED_OUTPATIENT_CLINIC_OR_DEPARTMENT_OTHER): Payer: Self-pay

## 2023-09-27 ENCOUNTER — Encounter: Payer: Self-pay | Admitting: Rehabilitative and Restorative Service Providers"

## 2023-09-27 ENCOUNTER — Ambulatory Visit: Admitting: Rehabilitative and Restorative Service Providers"

## 2023-09-27 DIAGNOSIS — M62838 Other muscle spasm: Secondary | ICD-10-CM | POA: Diagnosis not present

## 2023-09-27 DIAGNOSIS — M6281 Muscle weakness (generalized): Secondary | ICD-10-CM

## 2023-09-27 DIAGNOSIS — R2681 Unsteadiness on feet: Secondary | ICD-10-CM | POA: Diagnosis not present

## 2023-09-27 DIAGNOSIS — M25552 Pain in left hip: Secondary | ICD-10-CM

## 2023-09-27 DIAGNOSIS — R42 Dizziness and giddiness: Secondary | ICD-10-CM | POA: Diagnosis not present

## 2023-09-27 NOTE — Therapy (Signed)
 OUTPATIENT PHYSICAL THERAPY TREATMENT NOTE   Patient Name: Glenda Bauer MRN: [id number] DOB:Aug 19, 1950, 73 y.o., female Today's Date: 09/27/2023  END OF SESSION:  PT End of Session - 09/27/23 0937     Visit Number 8    Date for PT Re-Evaluation 11/16/23    Authorization Type [employer]    Authorization Time Period 08/24/23-10/19/23    Authorization - Visit Number 8    Authorization - Number of Visits 16    Progress Note Due on Visit 10    PT Start Time 0935    PT Stop Time 1015    PT Time Calculation (min) 40 min    Activity Tolerance Patient tolerated treatment well    Behavior During Therapy [facility] for tasks assessed/performed              Past Medical History:  Diagnosis Date   Allergy    Anxiety    Asthma    Atrial fibrillation (HCC)    Cancer (HCC)    Melanoma and Mycosis Fungosis   Cataract    Cutaneous T-cell lymphoma (HCC) 2011   Dermatologist treated in [place] ; Dr. Mervyn Ace   Depression    GERD (gastroesophageal reflux disease)    Heart murmur    Hyperlipidemia    Hypertension    Neuromuscular disorder (HCC)    Osteoarthritis    Sebaceous cyst 2022   right vulva   Thyroid  nodule    Urinary tract infection    Past Surgical History:  Procedure Laterality Date   ATRIAL FIBRILLATION ABLATION N/A 05/12/2022   Procedure: ATRIAL FIBRILLATION ABLATION;  Surgeon: Lei Pump, MD;  Location: [facility];  Service: Cardiovascular;  Laterality: N/A;   Cataracts Bilateral    CHOLECYSTECTOMY  2015   melenoma removal  1979   Sinus Polyps      Patient Active Problem List   Diagnosis Date Noted   Dilation of aorta (HCC) 08/11/2023   Statin intolerance 08/11/2023   Coronary artery disease involving native coronary artery of native heart without angina pectoris 08/11/2023   Hyperlipidemia 01/05/2022   OSA (obstructive sleep apnea) 07/28/2021   Thoracic aortic aneurysm without rupture (HCC) 01/08/2021   Sebaceous cyst 2022    Paroxysmal atrial fibrillation (HCC) 04/03/2020   Hypercoagulable state due to paroxysmal atrial fibrillation (HCC) 04/03/2020   Thyroid  nodule    Osteoarthritis    Cyst of knee joint 02/13/2016   Sinusitis 10/30/2015   Asthma with exacerbation 10/30/2015   Sciatica 03/06/2015   Abdominal pain, right upper quadrant 02/12/2015   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  States that she has been doing her exercises at home.  PERTINENT HISTORY: A-fib, abdominal surgery (cholecystectomy), anxiety, asthma, hx melanoma, depression PAIN:  Are you having pain? Yes: NPRS scale: 3/10  Pain location: Lt lateral leg and hip Pain description: sore Aggravating factors: worst at night, has to change positions; stairs Relieving factors: rest  PRECAUTIONS: Fall  RED FLAGS: None   WEIGHT BEARING RESTRICTIONS: No  FALLS:  Has patient fallen in last 6 months? Yes. Number of falls At least 5 times.  Most recent fall in early March when she was walking the standard size poodle (pt reports that she hit her head during the fall), other falls have occurred when walking the dog and it can pull her off balance.  LIVING ENVIRONMENT: Lives with: lives with their family Lives in: House/apartment Stairs: Yes: Internal: up to bedroom steps; on right going up Has following  equipment at home: None  OCCUPATION: retired  PLOF: Independent  PATIENT GOALS: get back to walking the dog 5 miles a day  NEXT MD VISIT: none scheduled - 6 months  OBJECTIVE:  Note: Objective measures were completed at Evaluation unless otherwise noted. 08/24/23: DIAGNOSTIC FINDINGS: none for this issue   PATIENT SURVEYS:  Eval:  LEFS: 47.5  COGNITION: Overall cognitive status: Within functional limits for tasks assessed     SENSATION: WFL  POSTURE: rounded shoulders, forward head, decreased lumbar lordosis, increased thoracic kyphosis, and posterior pelvic tilt  PALPATION: Significant tenderness throughout Lt glutes, tensor fascia latte, and lateral thigh  LOWER EXTREMITY MMT:  MMT Right eval Left eval  Hip flexion 4 3  Hip extension 3 2  Hip abduction 4 2  Hip adduction 3 3   (Blank rows = not tested)    FUNCTIONAL TESTS:   Eval: Squat: limited descent, unsteady, had to hold table Single leg stance:  Rt: pelvic drop, unable without UE support  Lt: pelvic drop, unable without UE support 5x sit<>stand: 17 seconds  Timed up and go (TUG): 16 seconds  09/05/23:  5x sit to stand: 13.05 without hands   09/20/2023: Timed up and go (TUG): 7.73 sec 5 times sit to/from stand:  10.34 sec Single leg stance:  right - 6.20 sec, left- 4.30 sec   GAIT:  Comments: Unsteady, antalgic Lt LE, decreased bil hip extnesion  VESTIBULAR ASSESSMENT: 08/29/2023: Smooth pursuit:  nystagmus noted with horizontal pursuit, especially towards the right side Gaze stability:  WNL Saccades are WNL Head Thrust:  positive for nystagmus to the right Dix Hallpike: slight positive on the right, but more positive for right upbeating nystagmus on the left                                                                                                                               TODAY'S TREATMENT  DATE: 09/27/2023 Nustep level 5 x6 min with PT present to discuss status Sit to/from  stand holding 5# kettlebell:  x10 with chest press, x10 with overhead press Side stepping with yellow loop 3x15 ft bilat Tandem stance on AirEx beam in parallel bars down and back x3 laps Side stepping on AirEx beam in parallel bars down and back x3 laps Standing on foam pad:  with head nods 2x1 min, with cervical rotations 2x1 min Alt LE toe taps  to 8" step 2x10 Standing hamstring stretch on 2nd step 2x20 sec bilat Seated piriformis stretch 2x20 sec bilat Standing on wobble board x1 min   DATE: 09/20/2023 Nustep level 5 x6 min with PT present to discuss status 5 times sit to stand and TUG Single leg stance with UE support as needed on flat side of half foam roll 2x30 sec bilat Seated with 2.5# ankle weights:  LAQ, marching, hip ER.  2x10 each bilat Ambulation to cancer gym and back with 2.5# ankle weights for 2 laps without a break Ambulation down PT hallway with head nods, then head turns x2 laps each Standing on foam pad performing ball toss x20 throws Alt LE toe taps to 8" step 2x10   DATE: 09/14/2023 Nustep level 6 x6 min with PT present to discuss status Seated hamstring stretch 2x20 sec bilat Seated piriformis stretch 2x20 sec bilat Seated with 2.5# ankle weights:  LAQ, marching, hip ER.  2x10 each bilat Ambulation to cancer gym and back with 2.5# ankle weights  Ambulation down PT hallway with head nods, then head turns x2 laps each Standing at barre:  marching, hip abduction, hip extension.  2x10 each bilat Standing on wobble board x1 min    PATIENT EDUCATION:  Education details: See above Person educated: Patient Education method: Explanation, Demonstration, Tactile cues, Verbal cues, and Handouts Education comprehension: verbalized understanding  HOME EXERCISE PROGRAM: Access Code: [email or web address] URL: [email or web address] Date: 09/14/2023 Prepared by: Chaneta Comer Neamiah Sciarra  Exercises - Seated Hip Adduction Isometrics with Celeste Cola  - 1 x daily - 7 x weekly - 2 sets  - 10 reps - 5 hold - Seated Hip Abduction with Resistance  - 1 x daily - 7 x weekly - 2 sets - 10 reps - 5 hold - Wide Lower Trunk Rotation  - 1 x daily - 7 x weekly - 2 sets - 10 reps - Seated Horizontal Smooth Pursuit  - 1 x daily - 7 x weekly - 2 sets - 10 reps - Seated Gaze Stabilization with Head Rotation  - 1 x daily - 7 x weekly - 2 sets - 30 reps - Pencil Pushups  - 1 x daily - 7 x weekly - 2 sets - 10 reps - Brandt-Daroff Vestibular Exercise  - 1 x daily - 7 x weekly - 3-5 reps - Seated Hamstring Stretch  - 1 x daily - 7 x weekly - 2 reps - 20 sec hold - Seated Piriformis Stretch  - 1 x daily - 7 x weekly - 2 reps - 20 sec hold - Standing March with Counter Support  - 1 x daily - 7 x weekly - 2 sets - 10 reps - Standing Hip Abduction with Counter Support  - 1 x daily - 7 x weekly - 2 sets - 10 reps - Standing Hip Extension with Counter Support  - 1 x daily - 7 x weekly - 2 sets - 10 reps  ASSESSMENT:  CLINICAL IMPRESSION: Ms Titsworth presents to skilled PT reporting continued progress and that she has been doing her exercises.  Patient continues to progress and is nearing her 10th visit reassessment.  Patient with great participation throughout and good with less utilization of UE during balance tasks.  Patient continues to progress towards goal related activities.  OBJECTIVE IMPAIRMENTS: Abnormal gait, decreased activity tolerance, decreased coordination, decreased endurance, decreased mobility, difficulty walking, decreased ROM, decreased strength, increased fascial restrictions, increased muscle spasms, impaired tone, postural dysfunction, and pain.   ACTIVITY LIMITATIONS: squatting,  sleeping, stairs, bed mobility, and locomotion level  PARTICIPATION LIMITATIONS: cleaning, laundry, driving, shopping, and community activity  PERSONAL FACTORS: 3+ comorbidities: medical history  are also affecting patient's functional outcome.   REHAB POTENTIAL: Good  CLINICAL DECISION  MAKING: Evolving/moderate complexity  EVALUATION COMPLEXITY: Moderate   GOALS: Goals reviewed with patient? Yes  SHORT TERM GOALS: Target date: 09/21/2023  Pt will be independent with HEP.   Baseline: Goal status: Met on 08/31/23  2.  Pt will undergo vestibular evaluation.  Baseline:  Goal status: Met on 08/29/23  3.  Pt will report 25% improvement in Lt hip pain.  Baseline:  Goal status: Met on 09/20/23   LONG TERM GOALS: Target date: 11/16/2023  Pt will be independent with advanced HEP.   Baseline:  Goal status: Ongoing  2.  Pt will report 0 falls in the two weeks prior to discharge.  Baseline:  Goal status: Ongoing  3.  Pt will report 75% improvement in dizziness.  Baseline:  Goal status: Met on 09/14/2023  4.  Pt will return to 5 mile walks with dog without unsteadiness or loss of balance.  Baseline: 2-3 miles over the day (09/05/23) Goal status: Ongoing  5.  Pt will be able to ascend/descend stairs in house without difficulty or pain.  Baseline:  Goal status: Ongoing  6.  Pt will report Lt hip pain no greater than 2/10.  Baseline:  Goal status: Ongoing  7.  Pt will decrease TUG time by 5 seconds in order to reduce fall risk.  Baseline:  Goal status: Met on 09/20/2023   PLAN:  PT FREQUENCY: 1-2x/week  PT DURATION: 12 weeks  PLANNED INTERVENTIONS: [id number]- PT Re-evaluation, 97110-Therapeutic exercises, 97530- Therapeutic activity, 97112- Neuromuscular re-education, 97535- Self Care, [id number]- Manual therapy, [id number]- Gait training, [phone]- Canalith repositioning, [id number]- Aquatic Therapy, [phone]- Electrical stimulation (unattended), [phone]- Electrical stimulation (manual), [id number]- Vasopneumatic device, [id number]- Ultrasound, [id number]- Traction (mechanical), [id number]- Ionotophoresis 4mg /ml Dexamethasone , Patient/Family education, Balance training, Stair training, Taping, Dry Needling, Joint mobilization, Joint manipulation, Spinal manipulation, Spinal mobilization, Vestibular  training, Cryotherapy, and Moist heat  PLAN FOR NEXT SESSION: Assess and progress HEP to include balance/standing, strengthening, flexibility, manual/dry needling as indicated, vestibular and canalith repositioning as indicated.    Robyne Christen, PT, DPT 09/27/23, 10:20 AM  [facility] [address] [place], [place] [address] Phone # [phone] Fax [phone]

## 2023-09-29 ENCOUNTER — Ambulatory Visit: Admitting: Rehabilitative and Restorative Service Providers"

## 2023-09-29 ENCOUNTER — Encounter: Payer: Self-pay | Admitting: Rehabilitative and Restorative Service Providers"

## 2023-09-29 DIAGNOSIS — R42 Dizziness and giddiness: Secondary | ICD-10-CM | POA: Diagnosis not present

## 2023-09-29 DIAGNOSIS — M62838 Other muscle spasm: Secondary | ICD-10-CM

## 2023-09-29 DIAGNOSIS — R2681 Unsteadiness on feet: Secondary | ICD-10-CM | POA: Diagnosis not present

## 2023-09-29 DIAGNOSIS — M6281 Muscle weakness (generalized): Secondary | ICD-10-CM | POA: Diagnosis not present

## 2023-09-29 DIAGNOSIS — M25552 Pain in left hip: Secondary | ICD-10-CM | POA: Diagnosis not present

## 2023-09-29 NOTE — Therapy (Signed)
 OUTPATIENT PHYSICAL THERAPY TREATMENT NOTE   Patient Name: Glenda Bauer MRN: [id number] DOB:06/22/1950, 73 y.o., female Today's Date: 09/29/2023  END OF SESSION:  PT End of Session - 09/29/23 0939     Visit Number 9    Date for PT Re-Evaluation 11/16/23    Authorization Type [employer]    Authorization Time Period 08/24/23-10/19/23    Authorization - Visit Number 9    Authorization - Number of Visits 16    Progress Note Due on Visit 10    PT Start Time 0937    PT Stop Time 1015    PT Time Calculation (min) 38 min    Activity Tolerance Patient tolerated treatment well    Behavior During Therapy [facility] for tasks assessed/performed              Past Medical History:  Diagnosis Date   Allergy    Anxiety    Asthma    Atrial fibrillation (HCC)    Cancer (HCC)    Melanoma and Mycosis Fungosis   Cataract    Cutaneous T-cell lymphoma (HCC) 2011   Dermatologist treated in [place] ; Dr. Mervyn Ace   Depression    GERD (gastroesophageal reflux disease)    Heart murmur    Hyperlipidemia    Hypertension    Neuromuscular disorder (HCC)    Osteoarthritis    Sebaceous cyst 2022   right vulva   Thyroid  nodule    Urinary tract infection    Past Surgical History:  Procedure Laterality Date   ATRIAL FIBRILLATION ABLATION N/A 05/12/2022   Procedure: ATRIAL FIBRILLATION ABLATION;  Surgeon: Lei Pump, MD;  Location: [facility];  Service: Cardiovascular;  Laterality: N/A;   Cataracts Bilateral    CHOLECYSTECTOMY  2015   melenoma removal  1979   Sinus Polyps      Patient Active Problem List   Diagnosis Date Noted   Dilation of aorta (HCC) 08/11/2023   Statin intolerance 08/11/2023   Coronary artery disease involving native coronary artery of native heart without angina pectoris 08/11/2023   Hyperlipidemia 01/05/2022   OSA (obstructive sleep apnea) 07/28/2021   Thoracic aortic aneurysm without rupture (HCC) 01/08/2021   Sebaceous cyst 2022    Paroxysmal atrial fibrillation (HCC) 04/03/2020   Hypercoagulable state due to paroxysmal atrial fibrillation (HCC) 04/03/2020   Thyroid  nodule    Osteoarthritis    Cyst of knee joint 02/13/2016   Sinusitis 10/30/2015   Asthma with exacerbation 10/30/2015   Sciatica 03/06/2015   Abdominal pain, right upper quadrant 02/12/2015   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  States that her dizziness is still improved.  PERTINENT HISTORY: A-fib, abdominal surgery (cholecystectomy), anxiety, asthma, hx melanoma, depression PAIN:  Are you having pain? Yes: NPRS scale: currently 3/10  Pain location: Left thigh/lateral thigh Pain description: sore Aggravating factors: worst at night, has to change positions; stairs Relieving factors: rest  PRECAUTIONS:  Fall  RED FLAGS: None   WEIGHT BEARING RESTRICTIONS: No  FALLS:  Has patient fallen in last 6 months? Yes. Number of falls At least 5 times.  Most recent fall in early March when she was walking the standard size poodle (pt reports that she hit her head during the fall), other falls have occurred when walking the dog and it can pull her off balance.  LIVING ENVIRONMENT: Lives with: lives with their family Lives in: House/apartment Stairs: Yes: Internal: up to bedroom steps; on right going up Has  following equipment at home: None  OCCUPATION: retired  PLOF: Independent  PATIENT GOALS: get back to walking the dog 5 miles a day  NEXT MD VISIT: none scheduled - 6 months  OBJECTIVE:  Note: Objective measures were completed at Evaluation unless otherwise noted. 08/24/23: DIAGNOSTIC FINDINGS: none for this issue   PATIENT SURVEYS:  Eval:  LEFS: 47.5 09/29/2023:  Lower Extremity Functional Score: 60 / 80 = 75.0 %  COGNITION: Overall cognitive status: Within functional limits for tasks assessed     SENSATION: WFL  POSTURE: rounded shoulders, forward head, decreased lumbar lordosis, increased thoracic kyphosis, and posterior pelvic tilt  PALPATION: Significant tenderness throughout Lt glutes, tensor fascia latte, and lateral thigh  LOWER EXTREMITY MMT:  MMT Right eval Left eval  Hip flexion 4 3  Hip extension 3 2  Hip abduction 4 2  Hip adduction 3 3   (Blank rows = not tested)    FUNCTIONAL TESTS:   Eval: Squat: limited descent, unsteady, had to hold table Single leg stance:  Rt: pelvic drop, unable without UE support  Lt: pelvic drop, unable without UE support 5x sit<>stand: 17 seconds  Timed up and go (TUG): 16 seconds  09/05/23:  5x sit to stand: 13.05 without hands   09/20/2023: Timed up and go (TUG): 7.73 sec 5 times sit to/from stand:  10.34 sec Single leg stance:  right - 6.20 sec, left- 4.30 sec   GAIT:  Comments: Unsteady, antalgic Lt LE, decreased bil hip extnesion  VESTIBULAR ASSESSMENT: 08/29/2023: Smooth pursuit:  nystagmus noted with horizontal pursuit, especially towards the right side Gaze stability:  WNL Saccades are WNL Head Thrust:  positive for nystagmus to the right Dix Hallpike: slight positive on the right, but more positive for right upbeating nystagmus on the left                                                                                                                               TODAY'S TREATMENT  DATE:  09/29/2023 Recumbent bike level 3 x6 min with PT present to discuss status Lower Extremity Functional Score: 60 / 80 = 75.0 % Sit to/from stand holding 5# kettlebell:  x10 with chest press, x10 with overhead press Side stepping with yellow loop 3x10 ft bilat Tandem stance on AirEx beam in parallel bars down and back x3 laps Side stepping on AirEx beam in parallel bars down and  back x3 laps Standing on foam pad:  with head nods 2x1 min, with cervical rotations 2x1 min FWD partial lunging onto bosu in parallel bars 2x10 bilat FWD step over 4 small hurdles in parallel bars down and back x3 laps Side step over 4 small hurdles in parallel bars down and back x3 laps Retro gait on treadmill -0.7 mph x3 min with PT present to provide verbal cuing for pacing for steps   DATE: 09/27/2023 Nustep level 5 x6 min with PT present to discuss status Sit to/from stand holding 5# kettlebell:  x10 with chest press, x10 with overhead press Side stepping with yellow loop 3x15 ft bilat Tandem stance on AirEx beam in parallel bars down and back x3 laps Side stepping on AirEx beam in parallel bars down and back x3 laps Standing on foam pad:  with head nods 2x1 min, with cervical rotations 2x1 min Alt LE toe taps to 8" step 2x10 Standing hamstring stretch on 2nd step 2x20 sec bilat Seated piriformis stretch 2x20 sec bilat Standing on wobble board x1 min   DATE: 09/20/2023 Nustep level 5 x6 min with PT present to discuss status 5 times sit to stand and TUG Single leg stance with UE support as needed on flat side of half foam roll 2x30 sec bilat Seated with 2.5# ankle weights:  LAQ, marching, hip ER.  2x10 each bilat Ambulation to cancer gym and back with 2.5# ankle weights for 2 laps without a break Ambulation down PT hallway with head nods, then head turns x2 laps each Standing on foam pad performing ball toss x20 throws Alt LE toe taps to 8" step 2x10    PATIENT EDUCATION:  Education details: See  above Person educated: Patient Education method: Explanation, Demonstration, Tactile cues, Verbal cues, and Handouts Education comprehension: verbalized understanding  HOME EXERCISE PROGRAM: Access Code: [email or web address] URL: [email or web address] Date: 09/14/2023 Prepared by: Chaneta Comer Mykiah Schmuck  Exercises - Seated Hip Adduction Isometrics with Ball  - 1 x daily - 7 x weekly - 2 sets - 10 reps - 5 hold - Seated Hip Abduction with Resistance  - 1 x daily - 7 x weekly - 2 sets - 10 reps - 5 hold - Wide Lower Trunk Rotation  - 1 x daily - 7 x weekly - 2 sets - 10 reps - Seated Horizontal Smooth Pursuit  - 1 x daily - 7 x weekly - 2 sets - 10 reps - Seated Gaze Stabilization with Head Rotation  - 1 x daily - 7 x weekly - 2 sets - 30 reps - Pencil Pushups  - 1 x daily - 7 x weekly - 2 sets - 10 reps - Brandt-Daroff Vestibular Exercise  - 1 x daily - 7 x weekly - 3-5 reps - Seated Hamstring Stretch  - 1 x daily - 7 x weekly - 2 reps - 20 sec hold - Seated Piriformis Stretch  - 1 x daily - 7 x weekly - 2 reps - 20 sec hold - Standing March with Counter Support  - 1 x daily - 7 x weekly - 2 sets - 10 reps - Standing Hip Abduction with Counter Support  - 1 x daily - 7 x weekly - 2 sets - 10 reps - Standing Hip Extension with Counter Support  - 1 x daily - 7 x weekly - 2 sets - 10 reps  ASSESSMENT:  CLINICAL IMPRESSION: Ms Christiana presents to skilled PT reporting continued progress and improved dizziness.  Patient with great improvement  on her Lower Extremity Functional Scale noted today.  Patient with great progress with balance and able to progress tasks today.  Added retrogait for improved balance and activation of glutes.  Patient continues to progress towards goal related activities.  OBJECTIVE IMPAIRMENTS: Abnormal gait, decreased activity tolerance, decreased coordination, decreased endurance, decreased mobility, difficulty walking, decreased ROM, decreased strength, increased  fascial restrictions, increased muscle spasms, impaired tone, postural dysfunction, and pain.   ACTIVITY LIMITATIONS: squatting, sleeping, stairs, bed mobility, and locomotion level  PARTICIPATION LIMITATIONS: cleaning, laundry, driving, shopping, and community activity  PERSONAL FACTORS: 3+ comorbidities: medical history  are also affecting patient's functional outcome.   REHAB POTENTIAL: Good  CLINICAL DECISION MAKING: Evolving/moderate complexity  EVALUATION COMPLEXITY: Moderate   GOALS: Goals reviewed with patient? Yes  SHORT TERM GOALS: Target date: 09/21/2023  Pt will be independent with HEP.   Baseline: Goal status: Met on 08/31/23  2.  Pt will undergo vestibular evaluation.  Baseline:  Goal status: Met on 08/29/23  3.  Pt will report 25% improvement in Lt hip pain.  Baseline:  Goal status: Met on 09/20/23   LONG TERM GOALS: Target date: 11/16/2023  Pt will be independent with advanced HEP.   Baseline:  Goal status: Ongoing  2.  Pt will report 0 falls in the two weeks prior to discharge.  Baseline:  Goal status: Ongoing  3.  Pt will report 75% improvement in dizziness.  Baseline:  Goal status: Met on 09/14/2023  4.  Pt will return to 5 mile walks with dog without unsteadiness or loss of balance.  Baseline: 2-3 miles over the day (09/05/23) Goal status: Ongoing  5.  Pt will be able to ascend/descend stairs in house without difficulty or pain.  Baseline:  Goal status: Ongoing  6.  Pt will report Lt hip pain no greater than 2/10.  Baseline:  Goal status: Ongoing  7.  Pt will decrease TUG time by 5 seconds in order to reduce fall risk.  Baseline:  Goal status: Met on 09/20/2023   PLAN:  PT FREQUENCY: 1-2x/week  PT DURATION: 12 weeks  PLANNED INTERVENTIONS: 97164- PT Re-evaluation, 97110-Therapeutic exercises, 97530- Therapeutic activity, 97112- Neuromuscular re-education, 97535- Self Care, 09811- Manual therapy, [id number]- Gait training, [phone]- Canalith  repositioning, [id number]- Aquatic Therapy, [phone]- Electrical stimulation (unattended), [phone]- Electrical stimulation (manual), [id number]- Vasopneumatic device, [id number]- Ultrasound, [id number]- Traction (mechanical), [id number]- Ionotophoresis 4mg /ml Dexamethasone , Patient/Family education, Balance training, Stair training, Taping, Dry Needling, Joint mobilization, Joint manipulation, Spinal manipulation, Spinal mobilization, Vestibular training, Cryotherapy, and Moist heat  PLAN FOR NEXT SESSION: Assess and progress HEP to include balance/standing, strengthening, flexibility, manual/dry needling as indicated, vestibular and canalith repositioning as indicated.    Robyne Christen, PT, DPT 09/29/23, 10:24 AM  [facility] [address] [place], [place] [address] Phone # [phone] Fax [phone]

## 2023-10-04 ENCOUNTER — Ambulatory Visit: Admitting: Rehabilitative and Restorative Service Providers"

## 2023-10-04 ENCOUNTER — Encounter: Payer: Self-pay | Admitting: Rehabilitative and Restorative Service Providers"

## 2023-10-04 DIAGNOSIS — R42 Dizziness and giddiness: Secondary | ICD-10-CM | POA: Diagnosis not present

## 2023-10-04 DIAGNOSIS — R2681 Unsteadiness on feet: Secondary | ICD-10-CM

## 2023-10-04 DIAGNOSIS — M25552 Pain in left hip: Secondary | ICD-10-CM | POA: Diagnosis not present

## 2023-10-04 DIAGNOSIS — M62838 Other muscle spasm: Secondary | ICD-10-CM

## 2023-10-04 DIAGNOSIS — M6281 Muscle weakness (generalized): Secondary | ICD-10-CM | POA: Diagnosis not present

## 2023-10-04 NOTE — Therapy (Signed)
 OUTPATIENT PHYSICAL THERAPY TREATMENT NOTE   Patient Name: Glenda Bauer MRN: [id number] DOB:1950-09-15, 73 y.o., female Today's Date: 10/04/2023  Progress Note Reporting Period 08/24/2023 to 10/04/2023  See note below for Objective Data and Assessment of Progress/Goals.     END OF SESSION:  PT End of Session - 10/04/23 0920     Visit Number 10    Date for PT Re-Evaluation 11/16/23    Authorization Type [employer]    Authorization - Visit Number 10    Authorization - Number of Visits 16    Progress Note Due on Visit 20    PT Start Time 0915    PT Stop Time 0955    PT Time Calculation (min) 40 min    Activity Tolerance Patient tolerated treatment well    Behavior During Therapy WFL for tasks assessed/performed              Past Medical History:  Diagnosis Date   Allergy    Anxiety    Asthma    Atrial fibrillation (HCC)    Cancer (HCC)    Melanoma and Mycosis Fungosis   Cataract    Cutaneous T-cell lymphoma (HCC) 2011   Dermatologist treated in [place] ; Dr. Mervyn Ace   Depression    GERD (gastroesophageal reflux disease)    Heart murmur    Hyperlipidemia    Hypertension    Neuromuscular disorder (HCC)    Osteoarthritis    Sebaceous cyst 2022   right vulva   Thyroid  nodule    Urinary tract infection    Past Surgical History:  Procedure Laterality Date   ATRIAL FIBRILLATION ABLATION N/A 05/12/2022   Procedure: ATRIAL FIBRILLATION ABLATION;  Surgeon: Lei Pump, MD;  Location: [facility];  Service: Cardiovascular;  Laterality: N/A;   Cataracts Bilateral    CHOLECYSTECTOMY  2015   melenoma removal  1979   Sinus Polyps      Patient Active Problem List   Diagnosis Date Noted   Dilation of aorta (HCC) 08/11/2023   Statin intolerance 08/11/2023   Coronary artery disease involving native coronary artery of native heart without angina pectoris 08/11/2023   Hyperlipidemia 01/05/2022   OSA (obstructive sleep apnea) 07/28/2021    Thoracic aortic aneurysm without rupture (HCC) 01/08/2021   Sebaceous cyst 2022   Paroxysmal atrial fibrillation (HCC) 04/03/2020   Hypercoagulable state due to paroxysmal atrial fibrillation (HCC) 04/03/2020   Thyroid  nodule    Osteoarthritis    Cyst of knee joint 02/13/2016   Sinusitis 10/30/2015   Asthma with exacerbation 10/30/2015   Sciatica 03/06/2015   Abdominal pain, right upper quadrant 02/12/2015   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  Patient continues to deny falls.  PERTINENT HISTORY: A-fib, abdominal surgery (cholecystectomy), anxiety, asthma, hx melanoma, depression PAIN:  Are you having pain? Yes: NPRS scale: 2/10  Pain location: Left thigh/lateral thigh Pain description: sore Aggravating factors: worst at night, has to  change positions; stairs Relieving factors: rest  PRECAUTIONS: Fall  RED FLAGS: None   WEIGHT BEARING RESTRICTIONS: No  FALLS:  Has patient fallen in last 6 months? Yes. Number of falls At least 5 times.  Most recent fall in early March when she was walking the standard size poodle (pt reports that she hit her head during the fall), other falls have occurred when walking the dog and it can pull her off balance.  LIVING ENVIRONMENT: Lives with: lives with their family Lives in: House/apartment Stairs: Yes: Internal: up to  bedroom steps; on right going up Has following equipment at home: None  OCCUPATION: retired  PLOF: Independent  PATIENT GOALS: get back to walking the dog 5 miles a day  NEXT MD VISIT: none scheduled - 6 months  OBJECTIVE:  Note: Objective measures were completed at Evaluation unless otherwise noted. 08/24/23: DIAGNOSTIC FINDINGS: none for this issue   PATIENT SURVEYS:  Eval:  LEFS: 47.5 09/29/2023:  Lower Extremity Functional Score: 60 / 80 = 75.0 %  COGNITION: Overall cognitive status: Within functional limits for tasks assessed     SENSATION: WFL  POSTURE: rounded shoulders, forward head, decreased lumbar lordosis, increased thoracic kyphosis, and posterior pelvic tilt  PALPATION: Significant tenderness throughout Lt glutes, tensor fascia latte, and lateral thigh  LOWER EXTREMITY MMT:  MMT Right eval Left eval  Hip flexion 4 3  Hip extension 3 2  Hip abduction 4 2  Hip adduction 3 3   (Blank rows = not tested)    FUNCTIONAL TESTS:   Eval: Squat: limited descent, unsteady, had to hold table Single leg stance:  Rt: pelvic drop, unable without UE support  Lt: pelvic drop, unable without UE support 5x sit<>stand: 17 seconds  Timed up and go (TUG): 16 seconds  09/05/23:  5x sit to stand: 13.05 without hands   09/20/2023: Timed up and go (TUG): 7.73 sec 5 times sit to/from stand:  10.34 sec Single leg stance:  right - 6.20 sec, left- 4.30 sec   GAIT:  Comments: Unsteady, antalgic Lt LE, decreased bil hip extnesion  VESTIBULAR ASSESSMENT: 08/29/2023: Smooth pursuit:  nystagmus noted with horizontal pursuit, especially towards the right side Gaze stability:  WNL Saccades are WNL Head Thrust:  positive for nystagmus to the right Dix Hallpike: slight positive on the right, but more positive for right upbeating nystagmus on the left                                                                                                                                TODAY'S TREATMENT  DATE: 10/04/2023 Nustep level 5 x6 min with PT present to discuss status Sit to/from stand holding 5# kettlebell:  x10 with chest press, x10 with overhead press Side stepping with yellow loop 3x10 ft bilat FWD partial lunging onto bosu in parallel bars 2x10 bilat Standing on flat side of bosu 2x1 min FWD step over 4 small hurdles in parallel  bars down and back x3 laps Side step over 4 small hurdles in parallel bars down and back x3 laps Tandem stance on AirEx beam in parallel bars down and back x3 laps Side stepping on AirEx beam in parallel bars down and back x3 laps Hip hike from 2" step 2x10 bilat Heel tap downs from 2" step 2x10 bilat Retro gait on treadmill -0.7 mph x3 min with PT present to provide verbal cuing for pacing for steps   DATE: 09/29/2023 Recumbent bike level 3 x6 min with PT present to discuss status Lower Extremity Functional Score: 60 / 80 = 75.0 % Sit to/from stand holding 5# kettlebell:  x10 with chest press, x10 with overhead press Side stepping with yellow loop 3x10 ft bilat Tandem stance on AirEx beam in parallel bars down and back x3 laps Side stepping on AirEx beam in parallel bars down and back x3 laps Standing on foam pad:  with head nods 2x1 min, with cervical rotations 2x1 min FWD partial lunging onto bosu in parallel bars 2x10 bilat FWD step over 4 small hurdles in parallel bars down and back x3 laps Side step over 4 small hurdles in parallel bars down and back x3 laps Retro gait on treadmill -0.7 mph x3 min with PT present to provide verbal cuing for pacing for steps   DATE: 09/27/2023 Nustep level 5 x6 min with PT present to discuss status Sit to/from stand holding 5# kettlebell:  x10 with chest press, x10 with overhead press Side stepping with yellow loop 3x15 ft bilat Tandem stance on AirEx beam in parallel bars down and back x3 laps Side stepping on AirEx beam in parallel bars down and back x3 laps Standing on foam pad:   with head nods 2x1 min, with cervical rotations 2x1 min Alt LE toe taps to 8" step 2x10 Standing hamstring stretch on 2nd step 2x20 sec bilat Seated piriformis stretch 2x20 sec bilat Standing on wobble board x1 min   PATIENT EDUCATION:  Education details: See above Person educated: Patient Education method: Explanation, Demonstration, Tactile cues, Verbal cues, and Handouts Education comprehension: verbalized understanding  HOME EXERCISE PROGRAM: Access Code: [email or web address] URL: [email or web address] Date: 09/14/2023 Prepared by: Chaneta Comer Jensine Luz  Exercises - Seated Hip Adduction Isometrics with Ball  - 1 x daily - 7 x weekly - 2 sets - 10 reps - 5 hold - Seated Hip Abduction with Resistance  - 1 x daily - 7 x weekly - 2 sets - 10 reps - 5 hold - Wide Lower Trunk Rotation  - 1 x daily - 7 x weekly - 2 sets - 10 reps - Seated Horizontal Smooth Pursuit  - 1 x daily - 7 x weekly - 2 sets - 10 reps - Seated Gaze Stabilization with Head Rotation  - 1 x daily - 7 x weekly - 2 sets - 30 reps - Pencil Pushups  - 1 x daily - 7 x weekly - 2 sets - 10 reps - Brandt-Daroff Vestibular Exercise  - 1 x daily - 7 x weekly - 3-5 reps - Seated Hamstring Stretch  - 1 x daily - 7 x weekly - 2 reps - 20 sec hold - Seated Piriformis Stretch  - 1 x daily - 7 x weekly - 2 reps - 20 sec hold - Standing March with Counter Support  - 1 x daily - 7 x weekly - 2 sets - 10 reps - Standing Hip Abduction with Counter Support  - 1 x daily - 7 x  weekly - 2 sets - 10 reps - Standing Hip Extension with Counter Support  - 1 x daily - 7 x weekly - 2 sets - 10 reps  ASSESSMENT:  CLINICAL IMPRESSION: Ms Strohschein presents to skilled PT reporting that she has noticed that she is getting stronger.  She states that she is noticing that she is able to walk over the various surfaces in the park when she is walking her dog.  Patient continues to deny any new falls and states the dizziness has improved.  Patient is  progressing with all goals and is able to walk her dog for longer distances.  Patient with less hip pain and is improving with her hip strength and balance.  Patient continues to require skilled PT to progress towards goal related activities.  OBJECTIVE IMPAIRMENTS: Abnormal gait, decreased activity tolerance, decreased coordination, decreased endurance, decreased mobility, difficulty walking, decreased ROM, decreased strength, increased fascial restrictions, increased muscle spasms, impaired tone, postural dysfunction, and pain.   ACTIVITY LIMITATIONS: squatting, sleeping, stairs, bed mobility, and locomotion level  PARTICIPATION LIMITATIONS: cleaning, laundry, driving, shopping, and community activity  PERSONAL FACTORS: 3+ comorbidities: medical history  are also affecting patient's functional outcome.   REHAB POTENTIAL: Good  CLINICAL DECISION MAKING: Evolving/moderate complexity  EVALUATION COMPLEXITY: Moderate   GOALS: Goals reviewed with patient? Yes  SHORT TERM GOALS: Target date: 09/21/2023  Pt will be independent with HEP.   Baseline: Goal status: Met on 08/31/23  2.  Pt will undergo vestibular evaluation.  Baseline:  Goal status: Met on 08/29/23  3.  Pt will report 25% improvement in Lt hip pain.  Baseline:  Goal status: Met on 09/20/23   LONG TERM GOALS: Target date: 11/16/2023  Pt will be independent with advanced HEP.   Baseline:  Goal status: Ongoing  2.  Pt will report 0 falls in the two weeks prior to discharge.  Baseline:  Goal status: Ongoing  3.  Pt will report 75% improvement in dizziness.  Baseline:  Goal status: Met on 09/14/2023  4.  Pt will return to 5 mile walks with dog without unsteadiness or loss of balance.  Baseline: 2-3 miles over the day (09/05/23) Goal status: Ongoing  5.  Pt will be able to ascend/descend stairs in house without difficulty or pain.  Baseline:  Goal status: Ongoing  6.  Pt will report Lt hip pain no greater than  2/10.  Baseline:  Goal status: Ongoing  7.  Pt will decrease TUG time by 5 seconds in order to reduce fall risk.  Baseline:  Goal status: Met on 09/20/2023   PLAN:  PT FREQUENCY: 1-2x/week  PT DURATION: 12 weeks  PLANNED INTERVENTIONS: 97164- PT Re-evaluation, 97110-Therapeutic exercises, 97530- Therapeutic activity, 97112- Neuromuscular re-education, 97535- Self Care, 16109- Manual therapy, [id number]- Gait training, [phone]- Canalith repositioning, [id number]- Aquatic Therapy, [phone]- Electrical stimulation (unattended), [phone]- Electrical stimulation (manual), [id number]- Vasopneumatic device, [id number]- Ultrasound, [id number]- Traction (mechanical), [id number]- Ionotophoresis 4mg /ml Dexamethasone , Patient/Family education, Balance training, Stair training, Taping, Dry Needling, Joint mobilization, Joint manipulation, Spinal manipulation, Spinal mobilization, Vestibular training, Cryotherapy, and Moist heat  PLAN FOR NEXT SESSION: Assess and progress HEP to include balance/standing, strengthening, flexibility, manual/dry needling as indicated, vestibular and canalith repositioning as indicated.    Robyne Christen, PT, DPT 10/04/23, 10:12 AM  [facility] [address], [address] [place], [place] [address] Phone # [phone] Fax [phone]

## 2023-10-06 ENCOUNTER — Encounter: Payer: Self-pay | Admitting: Rehabilitative and Restorative Service Providers"

## 2023-10-06 ENCOUNTER — Ambulatory Visit: Attending: Adult Health | Admitting: Rehabilitative and Restorative Service Providers"

## 2023-10-06 DIAGNOSIS — R42 Dizziness and giddiness: Secondary | ICD-10-CM | POA: Diagnosis not present

## 2023-10-06 DIAGNOSIS — M6281 Muscle weakness (generalized): Secondary | ICD-10-CM | POA: Insufficient documentation

## 2023-10-06 DIAGNOSIS — R2681 Unsteadiness on feet: Secondary | ICD-10-CM | POA: Insufficient documentation

## 2023-10-06 DIAGNOSIS — M62838 Other muscle spasm: Secondary | ICD-10-CM | POA: Diagnosis not present

## 2023-10-06 DIAGNOSIS — M25552 Pain in left hip: Secondary | ICD-10-CM | POA: Insufficient documentation

## 2023-10-06 NOTE — Therapy (Signed)
 OUTPATIENT PHYSICAL THERAPY TREATMENT NOTE   Patient Name: Glenda Bauer MRN: [id number] DOB:1950-07-28, 73 y.o., female Today's Date: 10/06/2023   END OF SESSION:  PT End of Session - 10/06/23 0850     Visit Number 11    Date for PT Re-Evaluation 11/16/23    Authorization Type [employer]    Authorization Time Period 08/24/23-10/19/23    Authorization - Visit Number 11    Authorization - Number of Visits 16    Progress Note Due on Visit 20    PT Start Time 0845    PT Stop Time 0925    PT Time Calculation (min) 40 min    Activity Tolerance Patient tolerated treatment well    Behavior During Therapy [facility] for tasks assessed/performed              Past Medical History:  Diagnosis Date   Allergy    Anxiety    Asthma    Atrial fibrillation (HCC)    Cancer (HCC)    Melanoma and Mycosis Fungosis   Cataract    Cutaneous T-cell lymphoma (HCC) 2011   Dermatologist treated in [place] ; Dr. Mervyn Ace   Depression    GERD (gastroesophageal reflux disease)    Heart murmur    Hyperlipidemia    Hypertension    Neuromuscular disorder (HCC)    Osteoarthritis    Sebaceous cyst 2022   right vulva   Thyroid  nodule    Urinary tract infection    Past Surgical History:  Procedure Laterality Date   ATRIAL FIBRILLATION ABLATION N/A 05/12/2022   Procedure: ATRIAL FIBRILLATION ABLATION;  Surgeon: Lei Pump, MD;  Location: [facility];  Service: Cardiovascular;  Laterality: N/A;   Cataracts Bilateral    CHOLECYSTECTOMY  2015   melenoma removal  1979   Sinus Polyps      Patient Active Problem List   Diagnosis Date Noted   Dilation of aorta (HCC) 08/11/2023   Statin intolerance 08/11/2023   Coronary artery disease involving native coronary artery of native heart without angina pectoris 08/11/2023   Hyperlipidemia 01/05/2022   OSA (obstructive sleep apnea) 07/28/2021   Thoracic aortic aneurysm without rupture (HCC) 01/08/2021   Sebaceous cyst 2022    Paroxysmal atrial fibrillation (HCC) 04/03/2020   Hypercoagulable state due to paroxysmal atrial fibrillation (HCC) 04/03/2020   Thyroid  nodule    Osteoarthritis    Cyst of knee joint 02/13/2016   Sinusitis 10/30/2015   Asthma with exacerbation 10/30/2015   Sciatica 03/06/2015   Abdominal pain, right upper quadrant 02/12/2015   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  "I still tip over some".  Pt denies any falls.  PERTINENT HISTORY: A-fib, abdominal surgery (cholecystectomy), anxiety, asthma, hx melanoma, depression PAIN:  Are you having pain? Yes: NPRS scale: 3/10  Pain location: Left thigh/lateral thigh Pain description: sore Aggravating factors: worst at night, has to change positions;  stairs Relieving factors: rest  PRECAUTIONS: Fall  RED FLAGS: None   WEIGHT BEARING RESTRICTIONS: No  FALLS:  Has patient fallen in last 6 months? Yes. Number of falls At least 5 times.  Most recent fall in early March when she was walking the standard size poodle (pt reports that she hit her head during the fall), other falls have occurred when walking the dog and it can pull her off balance.  LIVING ENVIRONMENT: Lives with: lives with their family Lives in: House/apartment Stairs: Yes: Internal: up to bedroom steps; on right going up Has  following equipment at home: None  OCCUPATION: retired  PLOF: Independent  PATIENT GOALS: get back to walking the dog 5 miles a day  NEXT MD VISIT: none scheduled - 6 months  OBJECTIVE:  Note: Objective measures were completed at Evaluation unless otherwise noted. 08/24/23: DIAGNOSTIC FINDINGS: none for this issue   PATIENT SURVEYS:  Eval:  LEFS: 47.5 09/29/2023:  Lower Extremity Functional Score: 60 / 80 = 75.0 %  COGNITION: Overall cognitive status: Within functional limits for tasks assessed     SENSATION: WFL  POSTURE: rounded shoulders, forward head, decreased lumbar lordosis, increased thoracic kyphosis, and posterior pelvic tilt  PALPATION: Significant tenderness throughout Lt glutes, tensor fascia latte, and lateral thigh  LOWER EXTREMITY MMT:  MMT Right eval Left eval  Hip flexion 4 3  Hip extension 3 2  Hip abduction 4 2  Hip adduction 3 3   (Blank rows = not tested)    FUNCTIONAL TESTS:   Eval: Squat: limited descent, unsteady, had to hold table Single leg stance:  Rt: pelvic drop, unable without UE support  Lt: pelvic drop, unable without UE support 5x sit<>stand: 17 seconds  Timed up and go (TUG): 16 seconds  09/05/23:  5x sit to stand: 13.05 without hands   09/20/2023: Timed up and go (TUG): 7.73 sec 5 times sit to/from stand:  10.34 sec Single leg stance:  right - 6.20 sec, left- 4.30 sec   GAIT:  Comments: Unsteady, antalgic Lt LE, decreased bil hip extnesion  VESTIBULAR ASSESSMENT: 08/29/2023: Smooth pursuit:  nystagmus noted with horizontal pursuit, especially towards the right side Gaze stability:  WNL Saccades are WNL Head Thrust:  positive for nystagmus to the right Dix Hallpike: slight positive on the right, but more positive for right upbeating nystagmus on the left                                                                                                                               TODAY'S TREATMENT  DATE:  10/06/2023 Nustep level 5 x6 min with PT present to discuss status Sit to/from stand holding 6# dumbbell:  x10 with chest press, x10 with overhead press Supine hamstring stretch with strap 2x20 sec bilat Supine IT band stretch with strap 2x20 sec bilat Tandem stance on AirEx beam in parallel bars down and back x3 laps Side stepping on AirEx beam in  parallel bars down and back x3 laps FWD step over 4 small hurdles in parallel bars down and back x3 laps Side step over 4 small hurdles in parallel bars down and back x3 laps Standing on foam pad:  with head nods x1 min, with cervical rotations x1 min   DATE: 10/04/2023 Nustep level 5 x6 min with PT present to discuss status Sit to/from stand holding 5# kettlebell:  x10 with chest press, x10 with overhead press Side stepping with yellow loop 3x10 ft bilat FWD partial lunging onto bosu in parallel bars 2x10 bilat Standing on flat side of bosu 2x1 min FWD step over 4 small hurdles in parallel bars down and back x3 laps Side step over 4 small hurdles in parallel bars down and back x3 laps Tandem stance on AirEx beam in parallel bars down and back x3 laps Side stepping on AirEx beam in parallel bars down and back x3 laps Hip hike from 2" step 2x10 bilat Heel tap downs from 2" step 2x10 bilat Retro gait on treadmill -0.7 mph x3 min with PT present to provide verbal cuing for pacing for steps   DATE: 09/29/2023 Recumbent bike level 3 x6 min with PT present to discuss status Lower Extremity Functional Score: 60 / 80 = 75.0 % Sit to/from stand holding 5# kettlebell:  x10 with chest press, x10 with overhead press Side stepping with yellow loop 3x10 ft bilat Tandem stance on AirEx beam in parallel bars down and back x3 laps Side stepping on AirEx beam in parallel bars down and back x3 laps Standing on foam pad:  with head nods 2x1 min, with cervical rotations 2x1 min FWD partial lunging onto bosu in parallel bars 2x10 bilat FWD step over 4 small  hurdles in parallel bars down and back x3 laps Side step over 4 small hurdles in parallel bars down and back x3 laps Retro gait on treadmill -0.7 mph x3 min with PT present to provide verbal cuing for pacing for steps   PATIENT EDUCATION:  Education details: See above Person educated: Patient Education method: Explanation, Demonstration, Tactile cues, Verbal cues, and Handouts Education comprehension: verbalized understanding  HOME EXERCISE PROGRAM: Access Code: [email or web address] URL: [email or web address] Date: 09/14/2023 Prepared by: Chaneta Comer Bryttney Netzer  Exercises - Seated Hip Adduction Isometrics with Ball  - 1 x daily - 7 x weekly - 2 sets - 10 reps - 5 hold - Seated Hip Abduction with Resistance  - 1 x daily - 7 x weekly - 2 sets - 10 reps - 5 hold - Wide Lower Trunk Rotation  - 1 x daily - 7 x weekly - 2 sets - 10 reps - Seated Horizontal Smooth Pursuit  - 1 x daily - 7 x weekly - 2 sets - 10 reps - Seated Gaze Stabilization with Head Rotation  - 1 x daily - 7 x weekly - 2 sets - 30 reps - Pencil Pushups  - 1 x daily - 7 x weekly - 2 sets - 10 reps - Brandt-Daroff Vestibular Exercise  - 1 x daily - 7 x weekly - 3-5 reps - Seated Hamstring Stretch  - 1 x daily - 7 x weekly - 2 reps - 20 sec hold - Seated Piriformis Stretch  - 1 x daily - 7 x weekly - 2 reps - 20 sec hold - Standing March with Counter Support  - 1 x daily - 7 x weekly - 2 sets - 10 reps - Standing Hip Abduction with Counter Support  -  1 x daily - 7 x weekly - 2 sets - 10 reps - Standing Hip Extension with Counter Support  - 1 x daily - 7 x weekly - 2 sets - 10 reps  ASSESSMENT:  CLINICAL IMPRESSION: Ms Shutter presents to skilled PT reporting that she can tell that she is getting stronger and more balanced, but still feels that she has some self corrected losses of balance during the day.  Patient continues to deny any new falls.  Patient continues to progress with improved balance during the session with  only occasional UE support, as needed.  OBJECTIVE IMPAIRMENTS: Abnormal gait, decreased activity tolerance, decreased coordination, decreased endurance, decreased mobility, difficulty walking, decreased ROM, decreased strength, increased fascial restrictions, increased muscle spasms, impaired tone, postural dysfunction, and pain.   ACTIVITY LIMITATIONS: squatting, sleeping, stairs, bed mobility, and locomotion level  PARTICIPATION LIMITATIONS: cleaning, laundry, driving, shopping, and community activity  PERSONAL FACTORS: 3+ comorbidities: medical history  are also affecting patient's functional outcome.   REHAB POTENTIAL: Good  CLINICAL DECISION MAKING: Evolving/moderate complexity  EVALUATION COMPLEXITY: Moderate   GOALS: Goals reviewed with patient? Yes  SHORT TERM GOALS: Target date: 09/21/2023  Pt will be independent with HEP.   Baseline: Goal status: Met on 08/31/23  2.  Pt will undergo vestibular evaluation.  Baseline:  Goal status: Met on 08/29/23  3.  Pt will report 25% improvement in Lt hip pain.  Baseline:  Goal status: Met on 09/20/23   LONG TERM GOALS: Target date: 11/16/2023  Pt will be independent with advanced HEP.   Baseline:  Goal status: Ongoing  2.  Pt will report 0 falls in the two weeks prior to discharge.  Baseline:  Goal status: Ongoing  3.  Pt will report 75% improvement in dizziness.  Baseline:  Goal status: Met on 09/14/2023  4.  Pt will return to 5 mile walks with dog without unsteadiness or loss of balance.  Baseline: 2-3 miles over the day (09/05/23) Goal status: Ongoing  5.  Pt will be able to ascend/descend stairs in house without difficulty or pain.  Baseline:  Goal status: Ongoing  6.  Pt will report Lt hip pain no greater than 2/10.  Baseline:  Goal status: Ongoing  7.  Pt will decrease TUG time by 5 seconds in order to reduce fall risk.  Baseline:  Goal status: Met on 09/20/2023   PLAN:  PT FREQUENCY: 1-2x/week  PT  DURATION: 12 weeks  PLANNED INTERVENTIONS: [id number]- PT Re-evaluation, 97110-Therapeutic exercises, 97530- Therapeutic activity, 97112- Neuromuscular re-education, 97535- Self Care, 11914- Manual therapy, [id number]- Gait training, [phone]- Canalith repositioning, [id number]- Aquatic Therapy, [phone]- Electrical stimulation (unattended), [phone]- Electrical stimulation (manual), [id number]- Vasopneumatic device, [id number]- Ultrasound, [id number]- Traction (mechanical), [id number]- Ionotophoresis 4mg /ml Dexamethasone , Patient/Family education, Balance training, Stair training, Taping, Dry Needling, Joint mobilization, Joint manipulation, Spinal manipulation, Spinal mobilization, Vestibular training, Cryotherapy, and Moist heat  PLAN FOR NEXT SESSION: Assess and progress HEP to include balance/standing, strengthening, flexibility, manual/dry needling as indicated, vestibular and canalith repositioning as indicated.    Robyne Christen, PT, DPT 10/06/23, 10:19 AM  [facility] ([place] [address] [place], [place] [address] Phone # [phone] Fax [phone]

## 2023-10-19 ENCOUNTER — Ambulatory Visit (INDEPENDENT_AMBULATORY_CARE_PROVIDER_SITE_OTHER): Payer: Medicare Other

## 2023-10-19 VITALS — Ht 65.0 in | Wt 175.0 lb

## 2023-10-19 DIAGNOSIS — Z0000 Encounter for general adult medical examination without abnormal findings: Secondary | ICD-10-CM

## 2023-10-19 DIAGNOSIS — Z1231 Encounter for screening mammogram for malignant neoplasm of breast: Secondary | ICD-10-CM | POA: Diagnosis not present

## 2023-10-19 NOTE — Progress Notes (Signed)
 Subjective:   Glenda Bauer is a 73 y.o. who presents for a Medicare Wellness preventive visit.  As a reminder, Annual Wellness Visits don't include a physical exam, and some assessments may be limited, especially if this visit is performed virtually. We may recommend an in-person visit if needed.  Visit Complete: Virtual I connected with  Glenda Bauer on 10/19/23 by a audio enabled telemedicine application and verified that I am speaking with the correct person using two identifiers.  Patient Location: Home  Provider Location: Home Office  I discussed the limitations of evaluation and management by telemedicine. The patient expressed understanding and agreed to proceed.  Vital Signs: Because this visit was a virtual/telehealth visit, some criteria may be missing or patient reported. Any vitals not documented were not able to be obtained and vitals that have been documented are patient reported.    Persons Participating in Visit: Patient.  AWV Questionnaire: No: Patient Medicare AWV questionnaire was not completed prior to this visit.  Cardiac Risk Factors include: advanced age (>65men, >64 women);hypertension     Objective:     Today's Vitals   10/19/23 1003  Weight: 175 lb (79.4 kg)  Height: 5\' 5"  (1.651 m)   Body mass index is 29.12 kg/m.     10/19/2023   10:12 AM 08/24/2023    9:38 AM 10/11/2022   10:50 AM 05/12/2022    9:57 AM 10/08/2021   10:08 AM 11/14/2014    8:52 AM  Advanced Directives  Does Patient Have a Medical Advance Directive? No No No No No No  Would patient like information on creating a medical advance directive? No - Patient declined No - Patient declined No - Patient declined No - Patient declined No - Patient declined     Current Medications (verified) Outpatient Encounter Medications as of 10/19/2023  Medication Sig   albuterol  (VENTOLIN  HFA) 108 (90 Base) MCG/ACT inhaler INHALE 2 PUFFS BY MOUTH EVERY 6 HOURS AS NEEDED FOR WHEEZING OR  SHORTNESS OF BREATH   ALPRAZolam  (XANAX ) 0.25 MG tablet TAKE 1-2 TABLETS BY MOUTH DAILY AS NEEDED   Evolocumab  (REPATHA  SURECLICK) 140 MG/ML SOAJ Inject 140 mg into the skin every 14 (fourteen) days.   ezetimibe  (ZETIA ) 10 MG tablet Take 1 tablet (10 mg total) by mouth daily.   famotidine (PEPCID) 20 MG tablet Take 20 mg by mouth daily as needed for indigestion or heartburn.   FLUAD QUADRIVALENT 0.5 ML injection Inject 0.5 mLs into the muscle once.   FLUoxetine  (PROZAC ) 40 MG capsule Take 1 capsule (40 mg total) by mouth daily.   fluticasone  (FLONASE SENSIMIST ) 27.5 MCG/SPRAY nasal spray Place 1 spray into the nose in the morning.   levalbuterol  (XOPENEX ) 0.63 MG/3ML nebulizer solution Take 3 mLs (0.63 mg total) by nebulization every 6 (six) hours as needed for wheezing or shortness of breath.   levocetirizine (XYZAL) 5 MG tablet Take 5 mg by mouth every other day. In the evening.   lisinopril -hydrochlorothiazide  (ZESTORETIC ) 20-12.5 MG tablet Take 2 tablets by mouth daily.   meclizine  (ANTIVERT ) 25 MG tablet Take 1 tablet (25 mg total) by mouth 3 (three) times daily as needed for dizziness.   metoprolol  succinate (TOPROL -XL) 25 MG 24 hr tablet Take 1 tablet (25 mg total) by mouth daily. Take with or immediately following a meal.   montelukast  (SINGULAIR ) 10 MG tablet TAKE 1 TABLET BY MOUTH AT BEDTIME   Multiple Vitamin (MULTIVITAMIN) tablet Take 1 tablet by mouth every evening.   Multiple Vitamins-Minerals (VISION FORMULA  PO) Take 1 capsule by mouth every evening.   Omega-3 Fatty Acids (FISH OIL PO) Take 1 capsule by mouth daily.   SYMBICORT  160-4.5 MCG/ACT inhaler INHALE 2 PUFFS BY MOUTH TWICE DAILY   triamcinolone  cream (KENALOG ) 0.1 % Apply TOPICALLY twice daily   XARELTO  20 MG TABS tablet TAKE 1 TABLET BY MOUTH EVERY EVENING   No facility-administered encounter medications on file as of 10/19/2023.    Allergies (verified) Patient has no known allergies.   History: Past Medical  History:  Diagnosis Date   Allergy    Anxiety    Asthma    Atrial fibrillation (HCC)    Cancer (HCC)    Melanoma and Mycosis Fungosis   Cataract    Cutaneous T-cell lymphoma (HCC) 2011   Dermatologist treated in [place] ; Dr. Mervyn Ace   Depression    GERD (gastroesophageal reflux disease)    Heart murmur    Hyperlipidemia    Hypertension    Neuromuscular disorder (HCC)    Osteoarthritis    Sebaceous cyst 2022   right vulva   Thyroid  nodule    Urinary tract infection    Past Surgical History:  Procedure Laterality Date   ATRIAL FIBRILLATION ABLATION N/A 05/12/2022   Procedure: ATRIAL FIBRILLATION ABLATION;  Surgeon: Lei Pump, MD;  Location: [facility];  Service: Cardiovascular;  Laterality: N/A;   Cataracts Bilateral    CHOLECYSTECTOMY  2015   melenoma removal  1979   Sinus Polyps      Family History  Problem Relation Age of Onset   Arthritis Mother    Ovarian cancer Mother        Metastatic   Breast cancer Mother 26   Thyroid  disease Mother    Parkinson's disease Father    Crohn's disease Brother    Schizophrenia Brother    Colon cancer Neg Hx    Esophageal cancer Neg Hx    Rectal cancer Neg Hx    Stomach cancer Neg Hx    Social History   Socioeconomic History   Marital status: Divorced    Spouse name: Not on file   Number of children: 3   Years of education: 16   Highest education level: Some college, no degree  Occupational History   Occupation: Warehouse manager   Tobacco Use   Smoking status: Former    Current packs/day: 0.00    Average packs/day: 0.3 packs/day for 10.0 years (2.5 ttl pk-yrs)    Types: Cigarettes    Start date: 06/08/1971    Quit date: 06/07/1981    Years since quitting: 42.3    Passive exposure: Past   Smokeless tobacco: Never   Tobacco comments:    Former smoker 12/02/21  Vaping Use   Vaping status: Never Used  Substance and Sexual Activity   Alcohol use: No    Alcohol/week: 0.0 standard drinks of alcohol    Drug use: No   Sexual activity: Not Currently    Birth control/protection: Post-menopausal  Other Topics Concern   Not on file  Social History Narrative   Separated    Three children ( one lives locally)    Social Drivers of Health   Financial Resource Strain: Low Risk  (10/19/2023)   Overall Financial Resource Strain (CARDIA)    Difficulty of Paying Living Expenses: Not hard at all  Food Insecurity: No Food Insecurity (10/19/2023)   Hunger Vital Sign    Worried About Running Out of Food in the Last Year: Never true  Ran Out of Food in the Last Year: Never true  Transportation Needs: No Transportation Needs (10/19/2023)   PRAPARE - Administrator, Civil Service (Medical): No    Lack of Transportation (Non-Medical): No  Physical Activity: Sufficiently Active (10/19/2023)   Exercise Vital Sign    Days of Exercise per Week: 7 days    Minutes of Exercise per Session: 90 min  Stress: No Stress Concern Present (10/19/2023)   [employer] of Occupational Health - Occupational Stress Questionnaire    Feeling of Stress : Not at all  Social Connections: Socially Isolated (10/19/2023)   Social Connection and Isolation Panel [NHANES]    Frequency of Communication with Friends and Family: More than three times a week    Frequency of Social Gatherings with Friends and Family: More than three times a week    Attends Religious Services: Never    Database administrator or Organizations: No    Attends Banker Meetings: Never    Marital Status: Divorced    Tobacco Counseling Counseling given: Not Answered Tobacco comments: Former smoker 12/02/21    Clinical Intake:  Pre-visit preparation completed: Yes  Pain : No/denies pain     BMI - recorded: 29.12 Nutritional Status: BMI 25 -29 Overweight Nutritional Risks: None Diabetes: No  Lab Results  Component Value Date   HGBA1C 5.6 05/08/2021     How often do you need to have someone help you when you  read instructions, pamphlets, or other written materials from your doctor or pharmacy?: 1 - Never  Interpreter Needed?: No  Information entered by :: Farris Hong LPN   Activities of Daily Living     10/19/2023   10:10 AM  In your present state of health, do you have any difficulty performing the following activities:  Hearing? 0  Vision? 0  Difficulty concentrating or making decisions? 0  Walking or climbing stairs? 0  Dressing or bathing? 0  Doing errands, shopping? 0  Preparing Food and eating ? N  Using the Toilet? N  In the past six months, have you accidently leaked urine? Y  Comment Incontient of Urine. Wears Pads,.Followed by PCP  Do you have problems with loss of bowel control? N  Managing your Medications? N  Managing your Finances? N  Housekeeping or managing your Housekeeping? N    Patient Care Team: Alto Atta, NP as PCP - General (Family Medicine) Sonny Dust, MD (Inactive) as PCP - Cardiology (Cardiology) Lei Pump, MD as PCP - Electrophysiology (Cardiology) Pryor, Madeline G, [facility] (Inactive) as Pharmacist (Pharmacist)  Indicate any recent Medical Services you may have received from other than [facility] providers in the past year (date may be approximate).     Assessment:    This is a routine wellness examination for Glenda Bauer.  Hearing/Vision screen Hearing Screening - Comments:: Denies hearing difficulties   Vision Screening - Comments:: Eye Implants - up to date with routine eye exams with  [facility]   Goals Addressed               This Visit's Progress     Increase physical activity (pt-stated)        Remain active       Depression Screen     10/19/2023   10:08 AM 10/11/2022   10:48 AM 10/08/2021   10:03 AM 05/08/2021    9:04 AM 09/29/2020    9:22 AM 09/29/2020    9:12 AM 03/27/2020  8:27 AM  PHQ 2/9 Scores  PHQ - 2 Score 0 0 0 2 0 0 0  PHQ- 9 Score    5       Fall Risk     10/19/2023   10:11 AM  08/19/2023   11:27 AM 02/23/2023    4:27 PM 10/11/2022   10:49 AM 10/08/2021   10:07 AM  Fall Risk   Falls in the past year? 1 1 1  0 0  Number falls in past yr: 0 1 0 0 0  Injury with Fall? 0 1 0 0 0  Risk for fall due to : No Fall Risks   No Fall Risks No Fall Risks  Follow up Falls prevention discussed;Falls evaluation completed Falls evaluation completed  Falls prevention discussed     MEDICARE RISK AT HOME:  Medicare Risk at Home Any stairs in or around the home?: Yes If so, are there any without handrails?: No Home free of loose throw rugs in walkways, pet beds, electrical cords, etc?: Yes Adequate lighting in your home to reduce risk of falls?: Yes Life alert?: No Use of a cane, walker or w/c?: No Grab bars in the bathroom?: Yes Shower chair or bench in shower?: Yes Elevated toilet seat or a handicapped toilet?: No  TIMED UP AND GO:  Was the test performed?  No  Cognitive Function: 6CIT completed    11/14/2014    8:55 AM  MMSE - Mini Mental State Exam  Not completed: Unable to complete        10/19/2023   10:12 AM 10/11/2022   10:50 AM 10/08/2021   10:09 AM  6CIT Screen  What Year? 0 points 0 points 0 points  What month? 0 points 0 points 0 points  What time? 0 points 0 points 0 points  Count back from 20 0 points 0 points 0 points  Months in reverse 0 points 0 points 0 points  Repeat phrase 2 points 0 points 0 points  Total Score 2 points 0 points 0 points    Immunizations Immunization History  Administered Date(s) Administered   Fluad Quad(high Dose 65+) 03/27/2020   Influenza, High Dose Seasonal PF 02/13/2016, 06/21/2018, 02/15/2019   Influenza,inj,Quad PF,6+ Mos 05/21/2014   PFIZER(Purple Top)SARS-COV-2 Vaccination 08/06/2019, 09/04/2019, 04/05/2020   Pneumococcal Conjugate-13 11/07/2014   Pneumococcal Polysaccharide-23 12/24/2015   Zoster Recombinant(Shingrix) 04/01/2022, 10/03/2022    Screening Tests Health Maintenance  Topic Date Due   DTaP/Tdap/Td  (1 - Tdap) Never done   MAMMOGRAM  08/30/2021   COVID-19 Vaccine (4 - 2024-25 season) 02/06/2023   INFLUENZA VACCINE  01/06/2024   Medicare Annual Wellness (AWV)  10/18/2024   Pneumonia Vaccine 57+ Years old  Completed   DEXA SCAN  Completed   Hepatitis C Screening  Completed   Zoster Vaccines- Shingrix  Completed   HPV VACCINES  Aged Out   Meningococcal B Vaccine  Aged Out   Colonoscopy  Discontinued    Health Maintenance  Health Maintenance Due  Topic Date Due   DTaP/Tdap/Td (1 - Tdap) Never done   MAMMOGRAM  08/30/2021   COVID-19 Vaccine (4 - 2024-25 season) 02/06/2023   Health Maintenance Items Addressed: Mammogram ordered  Additional Screening:  Vision Screening: Recommended annual ophthalmology exams for early detection of glaucoma and other disorders of the eye.  Dental Screening: Recommended annual dental exams for proper oral hygiene  Community Resource Referral / Chronic Care Management: CRR required this visit?  No   CCM required this visit?  No  Plan:    I have personally reviewed and noted the following in the patient's chart:   Medical and social history Use of alcohol, tobacco or illicit drugs  Current medications and supplements including opioid prescriptions. Patient is not currently taking opioid prescriptions. Functional ability and status Nutritional status Physical activity Advanced directives List of other physicians Hospitalizations, surgeries, and ER visits in previous 12 months Vitals Screenings to include cognitive, depression, and falls Referrals and appointments  In addition, I have reviewed and discussed with patient certain preventive protocols, quality metrics, and best practice recommendations. A written personalized care plan for preventive services as well as general preventive health recommendations were provided to patient.   Dewayne Ford, LPN   1/61/0960   After Visit Summary: (MyChart) Due to this being a  telephonic visit, the after visit summary with patients personalized plan was offered to patient via MyChart   Notes: Nothing significant to report at this time.

## 2023-10-19 NOTE — Patient Instructions (Addendum)
 [name] , Thank you for taking time out of your busy schedule to complete your Annual Wellness Visit with me. I enjoyed our conversation and look forward to speaking with you again next year. I, as well as your care team,  appreciate your ongoing commitment to your health goals. Please review the following plan we discussed and let me know if I can assist you in the future. Your Game plan/ To Do List    Referrals: If you haven't heard from the office you've been referred to, please reach out to them at the phone provided.   Follow up Visits: Next Medicare AWV with our clinical staff: 10/24/24 @ 9:20a   Have you seen your provider in the last 6 months (3 months if uncontrolled diabetes)? Yes 02/24/23 Next Office Visit with your provider:   Clinician Recommendations:  Aim for 30 minutes of exercise or brisk walking, 6-8 glasses of water, and 5 servings of fruits and vegetables each day.       This is a list of the screening recommended for you and due dates:  Health Maintenance  Topic Date Due   DTaP/Tdap/Td vaccine (1 - Tdap) Never done   Mammogram  08/30/2021   COVID-19 Vaccine (4 - 2024-25 season) 02/06/2023   Flu Shot  01/06/2024   Medicare Annual Wellness Visit  10/18/2024   Pneumonia Vaccine  Completed   DEXA scan (bone density measurement)  Completed   Hepatitis C Screening  Completed   Zoster (Shingles) Vaccine  Completed   HPV Vaccine  Aged Out   Meningitis B Vaccine  Aged Out   Colon Cancer Screening  Discontinued    Advanced directives: (Declined) Advance directive discussed with you today. Even though you declined this today, please call our office should you change your mind, and we can give you the proper paperwork for you to fill out. Advance Care Planning is important because it:  [x]  Makes sure you receive the medical care that is consistent with your values, goals, and preferences  [x]  It provides guidance to your family and loved ones and reduces their  decisional burden about whether or not they are making the right decisions based on your wishes.  Follow the link provided in your after visit summary or read over the paperwork we have mailed to you to help you started getting your Advance Directives in place. If you need assistance in completing these, please reach out to us  so that we can help you!  See attachments for Preventive Care and Fall Prevention Tips.

## 2023-10-26 ENCOUNTER — Ambulatory Visit: Admitting: Rehabilitative and Restorative Service Providers"

## 2023-10-26 ENCOUNTER — Encounter: Payer: Self-pay | Admitting: Rehabilitative and Restorative Service Providers"

## 2023-10-26 DIAGNOSIS — R2681 Unsteadiness on feet: Secondary | ICD-10-CM

## 2023-10-26 DIAGNOSIS — R42 Dizziness and giddiness: Secondary | ICD-10-CM | POA: Diagnosis not present

## 2023-10-26 DIAGNOSIS — M62838 Other muscle spasm: Secondary | ICD-10-CM | POA: Diagnosis not present

## 2023-10-26 DIAGNOSIS — M6281 Muscle weakness (generalized): Secondary | ICD-10-CM | POA: Diagnosis not present

## 2023-10-26 DIAGNOSIS — M25552 Pain in left hip: Secondary | ICD-10-CM | POA: Diagnosis not present

## 2023-10-26 NOTE — Therapy (Signed)
 OUTPATIENT PHYSICAL THERAPY TREATMENT NOTE   Patient Name: Glenda Bauer MRN: [id number] DOB:September 08, 1950, 73 y.o., female Today's Date: 10/26/2023   END OF SESSION:  PT End of Session - 10/26/23 1057     Visit Number 12    Date for PT Re-Evaluation 11/16/23    Authorization Type [employer] - [employer] requested    Authorization - Visit Number 1    Progress Note Due on Visit 20    PT Start Time 1055    PT Stop Time 1135    PT Time Calculation (min) 40 min    Activity Tolerance Patient tolerated treatment well    Behavior During Therapy WFL for tasks assessed/performed              Past Medical History:  Diagnosis Date   Allergy    Anxiety    Asthma    Atrial fibrillation (HCC)    Cancer (HCC)    Melanoma and Mycosis Fungosis   Cataract    Cutaneous T-cell lymphoma (HCC) 2011   Dermatologist treated in [place] ; Dr. Mervyn Ace   Depression    GERD (gastroesophageal reflux disease)    Heart murmur    Hyperlipidemia    Hypertension    Neuromuscular disorder (HCC)    Osteoarthritis    Sebaceous cyst 2022   right vulva   Thyroid  nodule    Urinary tract infection    Past Surgical History:  Procedure Laterality Date   ATRIAL FIBRILLATION ABLATION N/A 05/12/2022   Procedure: ATRIAL FIBRILLATION ABLATION;  Surgeon: Lei Pump, MD;  Location: [facility];  Service: Cardiovascular;  Laterality: N/A;   Cataracts Bilateral    CHOLECYSTECTOMY  2015   melenoma removal  1979   Sinus Polyps      Patient Active Problem List   Diagnosis Date Noted   Dilation of aorta (HCC) 08/11/2023   Statin intolerance 08/11/2023   Coronary artery disease involving native coronary artery of native heart without angina pectoris 08/11/2023   Hyperlipidemia 01/05/2022   OSA (obstructive sleep apnea) 07/28/2021   Thoracic aortic aneurysm without rupture (HCC) 01/08/2021   Sebaceous cyst 2022   Paroxysmal atrial fibrillation (HCC) 04/03/2020   Hypercoagulable state  due to paroxysmal atrial fibrillation (HCC) 04/03/2020   Thyroid  nodule    Osteoarthritis    Cyst of knee joint 02/13/2016   Sinusitis 10/30/2015   Asthma with exacerbation 10/30/2015   Sciatica 03/06/2015   Abdominal pain, right upper quadrant 02/12/2015   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/21/2014   Essential hypertension 05/21/2014   Anxiety and depression 05/21/2014   Asthma, chronic 05/21/2014    PCP: Alto Atta, NP  REFERRING PROVIDER: Alto Atta, NP  REFERRING DIAG: R26.81 (ICD-10-CM) - Gait instability M76.32 (ICD-10-CM) - It band syndrome, left  THERAPY DIAG:  Muscle weakness (generalized)  Other muscle spasm  Pain in left hip  Unsteadiness on feet  Dizziness and giddiness  Rationale for Evaluation and Treatment: Rehabilitation  ONSET DATE: 03/08/2023  SUBJECTIVE:   SUBJECTIVE STATEMENT: Patient states that she has not been able to get a wait-list appointment that worked for her.  Patient states that she has fallen a couple of times since last visit.  Patient states that one of the falls was due to tripping over a twig.  PERTINENT HISTORY: A-fib, abdominal surgery (cholecystectomy), anxiety, asthma, hx melanoma, depression PAIN:  Are you having pain? Yes: NPRS scale: 4/10  Pain location: Left thigh/lateral thigh Pain description: sore Aggravating factors: worst  at night, has to change positions; stairs Relieving factors: rest  PRECAUTIONS: Fall  RED FLAGS: None   WEIGHT BEARING RESTRICTIONS: No  FALLS:  Has patient fallen in last 6 months? Yes. Number of falls At least 5 times.  Most recent fall in early March when she was walking the standard size poodle (pt reports that she hit her head during the fall), other falls have occurred when walking the dog and it can pull her off balance.  LIVING ENVIRONMENT: Lives with: lives with their family Lives in: House/apartment Stairs: Yes: Internal: up to  bedroom steps; on right going up Has following equipment at home: None  OCCUPATION: retired  PLOF: Independent  PATIENT GOALS: get back to walking the dog 5 miles a day  NEXT MD VISIT: none scheduled - 6 months  OBJECTIVE:  Note: Objective measures were completed at Evaluation unless otherwise noted. 08/24/23: DIAGNOSTIC FINDINGS: none for this issue   PATIENT SURVEYS:  Eval:  LEFS: 47.5 09/29/2023:  Lower Extremity Functional Score: 60 / 80 = 75.0 % 10/26/2023: Lower Extremity Functional Score: 44 / 80 = 55.0 % Total ABC score: 1010 / 1600 = 63.1 %  COGNITION: Overall cognitive status: Within functional limits for tasks assessed     SENSATION: WFL  POSTURE: rounded shoulders, forward head, decreased lumbar lordosis, increased thoracic kyphosis, and posterior pelvic tilt  PALPATION: Significant tenderness throughout Lt glutes, tensor fascia latte, and lateral thigh  LOWER EXTREMITY MMT:  MMT Right eval Left eval 10/26/23  Hip flexion 4 3 3+  Hip extension 3 2 3   Hip abduction 4 2 3   Hip adduction 3 3 3+   (Blank rows = not tested)    FUNCTIONAL TESTS:   Eval: Squat: limited descent, unsteady, had to hold table Single leg stance:  Rt: pelvic drop, unable without UE support  Lt: pelvic drop, unable without UE support 5x sit<>stand: 17 seconds  Timed up and go (TUG): 16 seconds  09/05/23:  5x sit to stand: 13.05 without hands   09/20/2023: Timed up and go (TUG): 7.73 sec 5 times sit to/from stand:  10.34 sec Single leg stance:  right - 6.20 sec, left- 4.30 sec  10/26/2023: 5 times sit to/from stand:  8.38 sec Single leg stance:  right - 6.28 sec, left- 4.35 sec   GAIT:  Comments: Unsteady, antalgic Lt LE, decreased bil hip extnesion  VESTIBULAR ASSESSMENT: 08/29/2023: Smooth pursuit:  nystagmus noted with horizontal pursuit, especially towards the right side Gaze stability:  WNL Saccades are WNL Head Thrust:  positive for nystagmus to the right Dix  Hallpike: slight positive on the right, but more positive for right upbeating nystagmus on the left                                                                                                                               TODAY'S TREATMENT  DATE: 10/26/2023 Nustep level 5 x6 min with PT present to discuss  status Sit to/from stand holding 5# dumbbell:  x10 with chest press, x10 with overhead press Farmer's carry with 5# kettlebell performing high marching.  X10 bilat Tandem stance on AirEx beam in parallel bars down and back x3 laps Side stepping on AirEx beam in parallel bars down and back x3 laps FWD step over 4 small hurdles in parallel bars down and back x3 laps Side step over 4 small hurdles in parallel bars down and back x3 laps Single leg stance on foam pod performing tap over hurdle x10 bilat   DATE: 10/06/2023 Nustep level 5 x6 min with PT present to discuss status Sit to/from stand holding 6# dumbbell:  x10 with chest press, x10 with overhead press Supine hamstring stretch with strap 2x20 sec bilat Supine IT band stretch with strap 2x20 sec bilat Tandem stance on AirEx beam in parallel bars down and back x3 laps Side stepping on AirEx beam in parallel bars down and back x3 laps FWD step over 4 small hurdles in parallel bars down and back x3 laps Side step over 4 small hurdles in parallel bars down and back x3 laps Standing on foam pad:  with head nods x1 min, with cervical rotations x1 min   DATE: 10/04/2023 Nustep level 5 x6 min with PT present to discuss status Sit to/from stand holding 5# kettlebell:  x10 with chest press, x10 with overhead press Side stepping with yellow loop 3x10 ft bilat FWD partial lunging onto bosu in parallel bars 2x10 bilat Standing on flat side of bosu 2x1 min FWD step over 4 small hurdles in parallel bars down and back x3 laps Side step over 4 small hurdles in parallel bars down and back x3 laps Tandem stance on AirEx beam in parallel bars  down and back x3 laps Side stepping on AirEx beam in parallel bars down and back x3 laps Hip hike from 2" step 2x10 bilat Heel tap downs from 2" step 2x10 bilat Retro gait on treadmill -0.7 mph x3 min with PT present to provide verbal cuing for pacing for steps    PATIENT EDUCATION:  Education details: See above Person educated: Patient Education method: Explanation, Demonstration, Tactile cues, Verbal cues, and Handouts Education comprehension: verbalized understanding  HOME EXERCISE PROGRAM: Access Code: [email or web address] URL: [email or web address] Date: 10/26/2023 Prepared by: Chaneta Comer Dakota Vanwart  Exercises - Seated Hip Adduction Isometrics with Celeste Cola  - 1 x daily - 7 x weekly - 2 sets - 10 reps - 5 hold - Seated Hip Abduction with Resistance  - 1 x daily - 7 x weekly - 2 sets - 10 reps - 5 hold - Wide Lower Trunk Rotation  - 1 x daily - 7 x weekly - 2 sets - 10 reps - Seated Horizontal Smooth Pursuit  - 1 x daily - 7 x weekly - 2 sets - 10 reps - Seated Gaze Stabilization with Head Rotation  - 1 x daily - 7 x weekly - 2 sets - 30 reps - Pencil Pushups  - 1 x daily - 7 x weekly - 2 sets - 10 reps - Brandt-Daroff Vestibular Exercise  - 1 x daily - 7 x weekly - 3-5 reps - Seated Hamstring Stretch  - 1 x daily - 7 x weekly - 2 reps - 20 sec hold - Seated Piriformis Stretch  - 1 x daily - 7 x weekly - 2 reps - 20 sec hold - Standing March with Counter Support  - 1 x daily - 7 x weekly - 2  sets - 10 reps - Standing Hip Abduction with Counter Support  - 1 x daily - 7 x weekly - 2 sets - 10 reps - Standing Hip Extension with Counter Support  - 1 x daily - 7 x weekly - 2 sets - 10 reps - Single Leg Stance with Support  - 1 x daily - 7 x weekly - 2 reps - 20 sec hold - Tandem Stance with Support  - 1 x daily - 7 x weekly - 2 reps - 20 sec hold  ASSESSMENT:  CLINICAL IMPRESSION: Ms Eddleman presents to skilled PT stating that she has fallen twice since she hasn't been coming  routinely to PT.  Patient has not had a visit since May 1, and it was noted that she had some decrease in her Lower Extremity Functional Scale today, but still better than at initial evaluation.  Patient able to participate in first assessment of ABC scale.  Patient able to progress during visit with balance and educated patient on the importance of adding in balance exercises to HEP to decrease risk of falling.  Patient continues to require skilled PT to progress towards goal related activities.  OBJECTIVE IMPAIRMENTS: Abnormal gait, decreased activity tolerance, decreased coordination, decreased endurance, decreased mobility, difficulty walking, decreased ROM, decreased strength, increased fascial restrictions, increased muscle spasms, impaired tone, postural dysfunction, and pain.   ACTIVITY LIMITATIONS: squatting, sleeping, stairs, bed mobility, and locomotion level  PARTICIPATION LIMITATIONS: cleaning, laundry, driving, shopping, and community activity  PERSONAL FACTORS: 3+ comorbidities: medical history are also affecting patient's functional outcome.   REHAB POTENTIAL: Good  CLINICAL DECISION MAKING: Evolving/moderate complexity  EVALUATION COMPLEXITY: Moderate   GOALS: Goals reviewed with patient? Yes  SHORT TERM GOALS: Target date: 09/21/2023  Pt will be independent with HEP.   Baseline: Goal status: Met on 08/31/23  2.  Pt will undergo vestibular evaluation.  Baseline:  Goal status: Met on 08/29/23  3.  Pt will report 25% improvement in Lt hip pain.  Baseline:  Goal status: Met on 09/20/23   LONG TERM GOALS: Target date: 11/16/2023  Pt will be independent with advanced HEP.   Baseline:  Goal status: Ongoing  2.  Pt will report 0 falls in the two weeks prior to discharge.  Baseline:  Goal status: Ongoing (2 falls reported in May)  3.  Pt will report 75% improvement in dizziness.  Baseline:  Goal status: Met on 09/14/2023  4.  Pt will return to 5 mile walks with  dog without unsteadiness or loss of balance.  Baseline: 2-3 miles over the day (09/05/23) Goal status: Ongoing  5.  Pt will be able to ascend/descend stairs in house without difficulty or pain.  Baseline:  Goal status: Ongoing  6.  Pt will report Lt hip pain no greater than 2/10.  Baseline:  Goal status: Ongoing (reports pan of 4/10 on 10/26/23)  7.  Pt will decrease TUG time by 5 seconds in order to reduce fall risk.  Baseline:  Goal status: Met on 09/20/2023   PLAN:  PT FREQUENCY: 1-2x/week  PT DURATION: 12 weeks  PLANNED INTERVENTIONS: 97164- PT Re-evaluation, 97110-Therapeutic exercises, 97530- Therapeutic activity, 97112- Neuromuscular re-education, 97535- Self Care, 96295- Manual therapy, [phone]- Gait training, [phone]- Canalith repositioning, [id number]- Aquatic Therapy, [phone]- Electrical stimulation (unattended), [phone]- Electrical stimulation (manual), [id number]- Vasopneumatic device, [id number]- Ultrasound, [id number]- Traction (mechanical), [id number]- Ionotophoresis 4mg /ml Dexamethasone , Patient/Family education, Balance training, Stair training, Taping, Dry Needling, Joint mobilization, Joint manipulation, Spinal manipulation, Spinal mobilization, Vestibular training, Cryotherapy, and  Moist heat  PLAN FOR NEXT SESSION: Assess and progress HEP to include balance/standing, strengthening, flexibility, manual/dry needling as indicated, vestibular and canalith repositioning as indicated.    Robyne Christen, PT, DPT 10/26/23, 11:37 AM  [facility] [address], [address] [place], [place] [address] Phone # [phone] Fax [phone]

## 2023-10-28 ENCOUNTER — Ambulatory Visit: Admitting: Rehabilitative and Restorative Service Providers"

## 2023-10-28 ENCOUNTER — Encounter: Payer: Self-pay | Admitting: Rehabilitative and Restorative Service Providers"

## 2023-10-28 DIAGNOSIS — M6281 Muscle weakness (generalized): Secondary | ICD-10-CM | POA: Diagnosis not present

## 2023-10-28 DIAGNOSIS — M25552 Pain in left hip: Secondary | ICD-10-CM

## 2023-10-28 DIAGNOSIS — R2681 Unsteadiness on feet: Secondary | ICD-10-CM

## 2023-10-28 DIAGNOSIS — M62838 Other muscle spasm: Secondary | ICD-10-CM

## 2023-10-28 DIAGNOSIS — R42 Dizziness and giddiness: Secondary | ICD-10-CM | POA: Diagnosis not present

## 2023-10-28 NOTE — Therapy (Signed)
 OUTPATIENT PHYSICAL THERAPY TREATMENT NOTE   Patient Name: Glenda Bauer MRN: [id number] DOB:[birth year], 73 y.o., female Today's Date: 10/28/2023   END OF SESSION:  PT End of Session - 10/28/23 1023     Visit Number 13    Date for PT Re-Evaluation 11/16/23    Authorization Type [employer] - [employer] requested    Authorization Time Period 08/24/23-10/19/23    Authorization - Visit Number 2    Progress Note Due on Visit 20    PT Start Time 1015    PT Stop Time 1055    PT Time Calculation (min) 40 min    Activity Tolerance Patient tolerated treatment well    Behavior During Therapy WFL for tasks assessed/performed              Past Medical History:  Diagnosis Date   Allergy    Anxiety    Asthma    Atrial fibrillation (HCC)    Cancer (HCC)    Melanoma and Mycosis Fungosis   Cataract    Cutaneous T-cell lymphoma (HCC) 2011   Dermatologist treated in [place] ; Dr. Mervyn Ace   Depression    GERD (gastroesophageal reflux disease)    Heart murmur    Hyperlipidemia    Hypertension    Neuromuscular disorder (HCC)    Osteoarthritis    Sebaceous cyst 2022   right vulva   Thyroid  nodule    Urinary tract infection    Past Surgical History:  Procedure Laterality Date   ATRIAL FIBRILLATION ABLATION N/A 05/12/2022   Procedure: ATRIAL FIBRILLATION ABLATION;  Surgeon: Lei Pump, MD;  Location: [facility];  Service: Cardiovascular;  Laterality: N/A;   Cataracts Bilateral    CHOLECYSTECTOMY  2015   melenoma removal  1979   Sinus Polyps      Patient Active Problem List   Diagnosis Date Noted   Dilation of aorta (HCC) 08/11/2023   Statin intolerance 08/11/2023   Coronary artery disease involving native coronary artery of native heart without angina pectoris 08/11/2023   Hyperlipidemia 01/05/2022   OSA (obstructive sleep apnea) 07/28/2021   Thoracic aortic aneurysm without rupture (HCC) 01/08/2021   Sebaceous cyst 2022   Paroxysmal atrial  fibrillation (HCC) 04/03/2020   Hypercoagulable state due to paroxysmal atrial fibrillation (HCC) 04/03/2020   Thyroid  nodule    Osteoarthritis    Cyst of knee joint 02/13/2016   Sinusitis 10/30/2015   Asthma with exacerbation 10/30/2015   Sciatica 03/06/2015   Abdominal pain, right upper quadrant 02/12/2015   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  PERTINENT HISTORY: A-fib, abdominal surgery (cholecystectomy), anxiety, asthma, hx melanoma, depression PAIN:  Are you having pain? Yes: NPRS scale: 2/10  Pain location: Left thigh/lateral thigh Pain description: sore Aggravating factors: worst at night, has to change positions; stairs Relieving factors: rest  PRECAUTIONS: Fall  RED FLAGS: None   WEIGHT BEARING RESTRICTIONS: No  FALLS:  Has  patient fallen in last 6 months? Yes. Number of falls At least 5 times.  Most recent fall in early March when she was walking the standard size poodle (pt reports that she hit her head during the fall), other falls have occurred when walking the dog and it can pull her off balance.  LIVING ENVIRONMENT: Lives with: lives with their family Lives in: House/apartment Stairs: Yes: Internal: up to bedroom steps; on right going up Has following equipment at home: None  OCCUPATION: retired  PLOF:  Independent  PATIENT GOALS: get back to walking the dog 5 miles a day  NEXT MD VISIT: none scheduled - 6 months  OBJECTIVE:  Note: Objective measures were completed at Evaluation unless otherwise noted. 08/24/23: DIAGNOSTIC FINDINGS: none for this issue   PATIENT SURVEYS:  Eval:  LEFS: 47.5 09/29/2023:  Lower Extremity Functional Score: 60 / 80 = 75.0 % 10/26/2023: Lower Extremity Functional Score: 44 / 80 = 55.0 % Total ABC score: 1010 / 1600 = 63.1 %  COGNITION: Overall cognitive status: Within functional limits for tasks assessed     SENSATION: WFL  POSTURE: rounded shoulders, forward head, decreased lumbar lordosis, increased thoracic kyphosis, and posterior pelvic tilt  PALPATION: Significant tenderness throughout Lt glutes, tensor fascia latte, and lateral thigh  LOWER EXTREMITY MMT:  MMT Right eval Left eval 10/26/23  Hip flexion 4 3 3+  Hip extension 3 2 3   Hip abduction 4 2 3   Hip adduction 3 3 3+   (Blank rows = not tested)    FUNCTIONAL TESTS:   Eval: Squat: limited descent, unsteady, had to hold table Single leg stance:  Rt: pelvic drop, unable without UE support  Lt: pelvic drop, unable without UE support 5x sit<>stand: 17 seconds  Timed up and go (TUG): 16 seconds  09/05/23:  5x sit to stand: 13.05 without hands   09/20/2023: Timed up and go (TUG): 7.73 sec 5 times sit to/from stand:  10.34 sec Single leg stance:  right - 6.20 sec, left- 4.30 sec  10/26/2023: 5 times sit to/from stand:  8.38 sec Single leg stance:  right - 6.28 sec, left- 4.35 sec   GAIT:  Comments: Unsteady, antalgic Lt LE, decreased bil hip extnesion  VESTIBULAR ASSESSMENT: 08/29/2023: Smooth pursuit:  nystagmus noted with horizontal pursuit, especially towards the right side Gaze stability:  WNL Saccades are WNL Head Thrust:  positive for nystagmus to the right Dix Hallpike: slight positive on the right, but more positive for right upbeating nystagmus on the  left  08/28/2023: Etta Heritage positive on the left                                                                                                                               TODAY'S TREATMENT  DATE: 10/28/2023 Nustep level 5 x6 min with PT present to discuss status Sit to/from stand holding 5# dumbbell:  x10 with chest press, x10 with overhead press Farmer's carry with  5# kettlebell performing high marching.  X10 bilat Dix Hallpike positive on the left.  Proceeded with canalith repositioning with Epley Maneuver x2 Tandem stance on AirEx beam in parallel bars down and back x3 laps Side stepping on AirEx beam in parallel bars down and back x3 laps Single leg stance on foam pod performing 3 way clock tap x10 bilat Seated hamstring stretch 2x20 sec bilat   DATE: 10/26/2023 Nustep level 5 x6 min with PT present to discuss status Sit to/from stand holding 5# dumbbell:  x10 with chest press, x10 with overhead press Farmer's carry with 5# kettlebell performing high marching.  X10 bilat Tandem stance on AirEx beam in parallel bars down and back x3 laps Side stepping on AirEx beam in parallel bars down and back x3 laps FWD step over 4 small hurdles in parallel bars down and back x3 laps Side step over 4 small hurdles in parallel bars down and back x3 laps Single leg stance on foam pod performing tap over hurdle x10 bilat Hip hike with unilateral leg on foam pod 2x10 bilat   DATE: 10/06/2023 Nustep level 5 x6 min with PT present to discuss status Sit to/from stand holding 6# dumbbell:  x10 with chest press, x10 with overhead press Supine hamstring stretch with strap 2x20 sec bilat Supine IT band stretch with strap 2x20 sec bilat Tandem stance on AirEx beam in parallel bars down and back x3 laps Side stepping on AirEx beam in parallel bars down and back x3 laps FWD step over 4 small hurdles in parallel bars down and back x3 laps Side step over 4 small hurdles in parallel bars down and back  x3 laps Standing on foam pad:  with head nods x1 min, with cervical rotations x1 min    PATIENT EDUCATION:  Education details: See above Person educated: Patient Education method: Explanation, Demonstration, Tactile cues, Verbal cues, and Handouts Education comprehension: verbalized understanding  HOME EXERCISE PROGRAM: Access Code: [email or web address] URL: [email or web address] Date: 10/26/2023 Prepared by: Chaneta Comer Pernie Grosso  Exercises - Seated Hip Adduction Isometrics with Celeste Cola  - 1 x daily - 7 x weekly - 2 sets - 10 reps - 5 hold - Seated Hip Abduction with Resistance  - 1 x daily - 7 x weekly - 2 sets - 10 reps - 5 hold - Wide Lower Trunk Rotation  - 1 x daily - 7 x weekly - 2 sets - 10 reps - Seated Horizontal Smooth Pursuit  - 1 x daily - 7 x weekly - 2 sets - 10 reps - Seated Gaze Stabilization with Head Rotation  - 1 x daily - 7 x weekly - 2 sets - 30 reps - Pencil Pushups  - 1 x daily - 7 x weekly - 2 sets - 10 reps - Brandt-Daroff Vestibular Exercise  - 1 x daily - 7 x weekly - 3-5 reps - Seated Hamstring Stretch  - 1 x daily - 7 x weekly - 2 reps - 20 sec hold - Seated Piriformis Stretch  - 1 x daily - 7 x weekly - 2 reps - 20 sec hold - Standing March with Counter Support  - 1 x daily - 7 x weekly - 2 sets - 10 reps - Standing Hip Abduction with Counter Support  - 1 x daily - 7 x weekly - 2 sets - 10 reps - Standing Hip Extension with Counter Support  - 1 x daily - 7 x weekly - 2 sets - 10 reps - Single Leg Stance  with Support  - 1 x daily - 7 x weekly - 2 reps - 20 sec hold - Tandem Stance with Support  - 1 x daily - 7 x weekly - 2 reps - 20 sec hold  ASSESSMENT:  CLINICAL IMPRESSION: Ms Ruzich presents to skilled PT stating no new falls.  Patient is reporting some unsteadiness feelings, so PT decided to reassess for BPPV.  Patient with positive Etta Heritage today, so proceeded with canalith repositioning with Epley Maneuver x2.  Patient with less nystagmus noted  on 2nd maneuver.  Patient continues to progress with balance during session.  Patient continues to require skilled PT to progress towards goal related activities.   OBJECTIVE IMPAIRMENTS: Abnormal gait, decreased activity tolerance, decreased coordination, decreased endurance, decreased mobility, difficulty walking, decreased ROM, decreased strength, increased fascial restrictions, increased muscle spasms, impaired tone, postural dysfunction, and pain.   ACTIVITY LIMITATIONS: squatting, sleeping, stairs, bed mobility, and locomotion level  PARTICIPATION LIMITATIONS: cleaning, laundry, driving, shopping, and community activity  PERSONAL FACTORS: 3+ comorbidities: medical history are also affecting patient's functional outcome.   REHAB POTENTIAL: Good  CLINICAL DECISION MAKING: Evolving/moderate complexity  EVALUATION COMPLEXITY: Moderate   GOALS: Goals reviewed with patient? Yes  SHORT TERM GOALS: Target date: 09/21/2023  Pt will be independent with HEP.   Baseline: Goal status: Met on 08/31/23  2.  Pt will undergo vestibular evaluation.  Baseline:  Goal status: Met on 08/29/23  3.  Pt will report 25% improvement in Lt hip pain.  Baseline:  Goal status: Met on 09/20/23   LONG TERM GOALS: Target date: 11/16/2023  Pt will be independent with advanced HEP.   Baseline:  Goal status: Ongoing  2.  Pt will report 0 falls in the two weeks prior to discharge.  Baseline:  Goal status: Ongoing (2 falls reported in May)  3.  Pt will report 75% improvement in dizziness.  Baseline:  Goal status: Met on 09/14/2023  4.  Pt will return to 5 mile walks with dog without unsteadiness or loss of balance.  Baseline: 2-3 miles over the day (09/05/23) Goal status: Ongoing  5.  Pt will be able to ascend/descend stairs in house without difficulty or pain.  Baseline:  Goal status: Ongoing  6.  Pt will report Lt hip pain no greater than 2/10.  Baseline:  Goal status: Ongoing (reports pan of  4/10 on 10/26/23)  7.  Pt will decrease TUG time by 5 seconds in order to reduce fall risk.  Baseline:  Goal status: Met on 09/20/2023   PLAN:  PT FREQUENCY: 1-2x/week  PT DURATION: 12 weeks  PLANNED INTERVENTIONS: 97164- PT Re-evaluation, 97110-Therapeutic exercises, 97530- Therapeutic activity, 97112- Neuromuscular re-education, 97535- Self Care, 29562- Manual therapy, [id number]- Gait training, [phone]- Canalith repositioning, [id number]- Aquatic Therapy, [phone]- Electrical stimulation (unattended), [phone]- Electrical stimulation (manual), [id number]- Vasopneumatic device, [id number]- Ultrasound, [id number]- Traction (mechanical), [id number]- Ionotophoresis 4mg /ml Dexamethasone , Patient/Family education, Balance training, Stair training, Taping, Dry Needling, Joint mobilization, Joint manipulation, Spinal manipulation, Spinal mobilization, Vestibular training, Cryotherapy, and Moist heat  PLAN FOR NEXT SESSION: Assess and progress HEP to include balance/standing, strengthening, flexibility, manual/dry needling as indicated, vestibular and canalith repositioning as indicated.    Robyne Christen, PT, DPT 10/28/23, 10:58 AM  [facility] [address] [place], [place] [address] Phone # [phone] Fax [phone]

## 2023-11-09 ENCOUNTER — Ambulatory Visit: Admitting: Rehabilitative and Restorative Service Providers"

## 2023-11-11 ENCOUNTER — Ambulatory Visit: Attending: Adult Health | Admitting: Rehabilitative and Restorative Service Providers"

## 2023-11-11 DIAGNOSIS — M25552 Pain in left hip: Secondary | ICD-10-CM | POA: Insufficient documentation

## 2023-11-11 DIAGNOSIS — M62838 Other muscle spasm: Secondary | ICD-10-CM | POA: Diagnosis not present

## 2023-11-11 DIAGNOSIS — R42 Dizziness and giddiness: Secondary | ICD-10-CM | POA: Diagnosis not present

## 2023-11-11 DIAGNOSIS — M6281 Muscle weakness (generalized): Secondary | ICD-10-CM | POA: Diagnosis not present

## 2023-11-11 DIAGNOSIS — R2681 Unsteadiness on feet: Secondary | ICD-10-CM | POA: Diagnosis not present

## 2023-11-11 NOTE — Therapy (Signed)
 OUTPATIENT PHYSICAL THERAPY TREATMENT NOTE AND REASSESSMENT NOTE   Patient Name: Glenda Bauer MRN: [id number] DOB:12-28-1950, 73 y.o., female Today's Date: 11/11/2023   END OF SESSION:  PT End of Session - 11/11/23 1015     Visit Number 14    Date for PT Re-Evaluation 01/06/24    Authorization Type [employer] Medicare    Authorization Time Period 10/26/23 - 12/21/23    Authorization - Visit Number 3    Authorization - Number of Visits 8    Progress Note Due on Visit 20    PT Start Time 1011    PT Stop Time 1050    PT Time Calculation (min) 39 min    Activity Tolerance Patient tolerated treatment well    Behavior During Therapy WFL for tasks assessed/performed              Past Medical History:  Diagnosis Date   Allergy    Anxiety    Asthma    Atrial fibrillation (HCC)    Cancer (HCC)    Melanoma and Mycosis Fungosis   Cataract    Cutaneous T-cell lymphoma (HCC) 2011   Dermatologist treated in [place] ; Dr. Mervyn Ace   Depression    GERD (gastroesophageal reflux disease)    Heart murmur    Hyperlipidemia    Hypertension    Neuromuscular disorder (HCC)    Osteoarthritis    Sebaceous cyst 2022   right vulva   Thyroid  nodule    Urinary tract infection    Past Surgical History:  Procedure Laterality Date   ATRIAL FIBRILLATION ABLATION N/A 05/12/2022   Procedure: ATRIAL FIBRILLATION ABLATION;  Surgeon: Lei Pump, MD;  Location: [facility];  Service: Cardiovascular;  Laterality: N/A;   Cataracts Bilateral    CHOLECYSTECTOMY  2015   melenoma removal  1979   Sinus Polyps      Patient Active Problem List   Diagnosis Date Noted   Dilation of aorta (HCC) 08/11/2023   Statin intolerance 08/11/2023   Coronary artery disease involving native coronary artery of native heart without angina pectoris 08/11/2023   Hyperlipidemia 01/05/2022   OSA (obstructive sleep apnea) 07/28/2021   Thoracic aortic aneurysm without rupture (HCC) 01/08/2021    Sebaceous cyst 2022   Paroxysmal atrial fibrillation (HCC) 04/03/2020   Hypercoagulable state due to paroxysmal atrial fibrillation (HCC) 04/03/2020   Thyroid  nodule    Osteoarthritis    Cyst of knee joint 02/13/2016   Sinusitis 10/30/2015   Asthma with exacerbation 10/30/2015   Sciatica 03/06/2015   Abdominal pain, right upper quadrant 02/12/2015   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/21/2014   Essential hypertension 05/21/2014   Anxiety and depression 05/21/2014   Asthma, chronic 05/21/2014    PCP: Alto Atta, NP  REFERRING PROVIDER: Alto Atta, NP  REFERRING DIAG: R26.81 (ICD-10-CM) - Gait instability M76.32 (ICD-10-CM) - It band syndrome, left  THERAPY DIAG:  Muscle weakness (generalized) - Plan: PT plan of care cert/re-cert  Other muscle spasm - Plan: PT plan of care cert/re-cert  Pain in left hip - Plan: PT plan of care cert/re-cert  Unsteadiness on feet - Plan: PT plan of care cert/re-cert  Dizziness and giddiness - Plan: PT plan of care cert/re-cert  Rationale for Evaluation and Treatment: Rehabilitation  ONSET DATE: 03/08/2023  SUBJECTIVE:   SUBJECTIVE STATEMENT: Patient states that she missed her last appointment, as she bent over and experienced a sharp pain in her right hip.  States that she could barely  move after.  States that she is still having some pain down her right leg.  States that her left hip seems to be feeling better today.  PERTINENT HISTORY: A-fib, abdominal surgery (cholecystectomy), anxiety, asthma, hx melanoma, depression PAIN:  Are you having pain? Yes: NPRS scale: 7/10  Pain location: Right hip and leg Pain description: sore Aggravating factors: worst at night, has to change positions; stairs Relieving factors: rest  PRECAUTIONS: Fall  RED FLAGS: None   WEIGHT BEARING RESTRICTIONS: No  FALLS:  Has patient fallen in last 6 months? Yes. Number of falls At least 5 times.  Most recent  fall in early March when she was walking the standard size poodle (pt reports that she hit her head during the fall), other falls have occurred when walking the dog and it can pull her off balance.  LIVING ENVIRONMENT: Lives with: lives with their family Lives in: House/apartment Stairs: Yes: Internal: up to bedroom steps; on right going up Has following equipment at home: None  OCCUPATION: retired  PLOF: Independent  PATIENT GOALS: get back to walking the dog 5 miles a day  NEXT MD VISIT: none scheduled - 6 months  OBJECTIVE:  Note: Objective measures were completed at Evaluation unless otherwise noted. 08/24/23: DIAGNOSTIC FINDINGS: none for this issue   PATIENT SURVEYS:  Eval:  LEFS: 47.5  09/29/2023:  Lower Extremity Functional Score: 60 / 80 = 75.0 %  10/26/2023: Lower Extremity Functional Score: 44 / 80 = 55.0 % Total ABC score: 1010 / 1600 = 63.1 %  COGNITION: Overall cognitive status: Within functional limits for tasks assessed     SENSATION: WFL  POSTURE: rounded shoulders, forward head, decreased lumbar lordosis, increased thoracic kyphosis, and posterior pelvic tilt  PALPATION: Significant tenderness throughout Lt glutes, tensor fascia latte, and lateral thigh  LOWER EXTREMITY MMT:  MMT Right eval Left eval 10/26/23  Hip flexion 4 3 3+  Hip extension 3 2 3   Hip abduction 4 2 3   Hip adduction 3 3 3+   (Blank rows = not tested)    FUNCTIONAL TESTS:   Eval: Squat: limited descent, unsteady, had to hold table Single leg stance:  Rt: pelvic drop, unable without UE support  Lt: pelvic drop, unable without UE support 5x sit<>stand: 17 seconds  Timed up and go (TUG): 16 seconds  09/05/23:  5x sit to stand: 13.05 without hands   09/20/2023: Timed up and go (TUG): 7.73 sec 5 times sit to/from stand:  10.34 sec Single leg stance:  right - 6.20 sec, left- 4.30 sec  10/26/2023: 5 times sit to/from stand:  8.38 sec Single leg stance:  right - 6.28 sec,  left- 4.35 sec  11/11/2023: Timed up and go (TUG): 8.65 sec 5 times sit to/from stand: 11.67 sec 3 minute walk test:  460 ft with antalgic gait noted   GAIT:  Comments: Unsteady, antalgic Lt LE, decreased bil hip extnesion  VESTIBULAR ASSESSMENT: 08/29/2023: Smooth pursuit:  nystagmus noted with horizontal pursuit, especially towards the right side Gaze stability:  WNL Saccades are WNL Head Thrust:  positive for nystagmus to the right Dix Hallpike: slight positive on the right, but more positive for right upbeating nystagmus on the left  08/28/2023: Etta Heritage positive on the left  11/11/2023: Etta Heritage positive on the left  TODAY'S TREATMENT  DATE: 11/11/2023 Nustep level 5 x6 min with PT present to discuss status 5 times sit to stand (with pain) and TUG 3 minute walk test:  460 ft with antalgic gait noted [employer] positive on the left.  Proceeded with canalith repositioning with Epley Maneuver x2 Tandem gait on AirEx beam in parallel bars down and back x2 laps Side stepping on AirEx beam in parallel bars down and back x2 laps FWD step over 4 small hurdles in parallel bars down and back x1 lap Manual Therapy:  instrument assisted soft tissue mobilization with Addaday to promote tissue elongation and elasticity to decrease tightness and spasms.   DATE: 10/28/2023 Nustep level 5 x6 min with PT present to discuss status Sit to/from stand holding 5# dumbbell:  x10 with chest press, x10 with overhead press Farmer's carry with 5# kettlebell performing high marching.  X10 bilat Dix Hallpike positive on the left.  Proceeded with canalith repositioning with Epley Maneuver x2 Tandem stance on AirEx beam in parallel bars down and back x3 laps Side stepping on AirEx beam in parallel bars down and back x3 laps Single leg stance on foam pod performing 3 way  clock tap x10 bilat Seated hamstring stretch 2x20 sec bilat   DATE: 10/26/2023 Nustep level 5 x6 min with PT present to discuss status Sit to/from stand holding 5# dumbbell:  x10 with chest press, x10 with overhead press Farmer's carry with 5# kettlebell performing high marching.  X10 bilat Tandem stance on AirEx beam in parallel bars down and back x3 laps Side stepping on AirEx beam in parallel bars down and back x3 laps FWD step over 4 small hurdles in parallel bars down and back x3 laps Side step over 4 small hurdles in parallel bars down and back x3 laps Single leg stance on foam pod performing tap over hurdle x10 bilat Hip hike with unilateral leg on foam pod 2x10 bilat   PATIENT EDUCATION:  Education details: See above Person educated: Patient Education method: Explanation, Demonstration, Tactile cues, Verbal cues, and Handouts Education comprehension: verbalized understanding  HOME EXERCISE PROGRAM: Access Code: [email or web address] URL: [email or web address] Date: 10/26/2023 Prepared by: Chaneta Comer Que Meneely  Exercises - Seated Hip Adduction Isometrics with Celeste Cola  - 1 x daily - 7 x weekly - 2 sets - 10 reps - 5 hold - Seated Hip Abduction with Resistance  - 1 x daily - 7 x weekly - 2 sets - 10 reps - 5 hold - Wide Lower Trunk Rotation  - 1 x daily - 7 x weekly - 2 sets - 10 reps - Seated Horizontal Smooth Pursuit  - 1 x daily - 7 x weekly - 2 sets - 10 reps - Seated Gaze Stabilization with Head Rotation  - 1 x daily - 7 x weekly - 2 sets - 30 reps - Pencil Pushups  - 1 x daily - 7 x weekly - 2 sets - 10 reps - Brandt-Daroff Vestibular Exercise  - 1 x daily - 7 x weekly - 3-5 reps - Seated Hamstring Stretch  - 1 x daily - 7 x weekly - 2 reps - 20 sec hold - Seated Piriformis Stretch  - 1 x daily - 7 x weekly - 2 reps - 20 sec hold - Standing March with Counter Support  - 1 x daily - 7 x weekly - 2 sets - 10 reps - Standing Hip Abduction with Counter Support  - 1 x daily - 7 x  weekly - 2 sets -  10 reps - Standing Hip Extension with Counter Support  - 1 x daily - 7 x weekly - 2 sets - 10 reps - Single Leg Stance with Support  - 1 x daily - 7 x weekly - 2 reps - 20 sec hold - Tandem Stance with Support  - 1 x daily - 7 x weekly - 2 reps - 20 sec hold  ASSESSMENT:  CLINICAL IMPRESSION: Ms Hutmacher presents to skilled PT stating no new falls, but does state that she missed last visit because she leaned forward and had a muscle spasm.  Patient states that she is feeling better, but still having some pain with movement and with bearing weight through RLE.  Patient does state that her left hip seems to be feeling better, as he is more limited by her right hip now due to pain.  Patient educated that she should follow up with her PCP if the pain does not continue to improve and she verbalizes her understanding.  Patient with positive Etta Heritage on left side and proceeded with canalith repositioning with the Epley Maneuver.  Patient does state that prior to the right hip sprain, she was doing better with walking her dog.  Patient has not yet met goals and would benefit from continued skilled PT to progress towards goal related activities.   OBJECTIVE IMPAIRMENTS: Abnormal gait, decreased activity tolerance, decreased coordination, decreased endurance, decreased mobility, difficulty walking, decreased ROM, decreased strength, increased fascial restrictions, increased muscle spasms, impaired tone, postural dysfunction, and pain.   ACTIVITY LIMITATIONS: squatting, sleeping, stairs, bed mobility, and locomotion level  PARTICIPATION LIMITATIONS: cleaning, laundry, driving, shopping, and community activity  PERSONAL FACTORS: 3+ comorbidities: medical history are also affecting patient's functional outcome.   REHAB POTENTIAL: Good  CLINICAL DECISION MAKING: Evolving/moderate complexity  EVALUATION COMPLEXITY: Moderate   GOALS: Goals reviewed with patient? Yes  SHORT TERM  GOALS: Target date: 09/21/2023  Pt will be independent with HEP.   Baseline: Goal status: Met on 08/31/23  2.  Pt will undergo vestibular evaluation.  Baseline:  Goal status: Met on 08/29/23  3.  Pt will report 25% improvement in Lt hip pain.  Baseline:  Goal status: Met on 09/20/23   LONG TERM GOALS: Target date: 01/06/2024  Pt will be independent with advanced HEP.   Baseline:  Goal status: Ongoing  2.  Pt will report 0 falls in the two weeks prior to discharge.  Baseline:  Goal status: Ongoing (2 falls reported in May)  3.  Pt will report 75% improvement in dizziness.  Baseline:  Goal status: Met on 09/14/2023  4.  Pt will return to 5 mile walks with dog without unsteadiness or loss of balance.  Baseline: 2-3 miles over the day (09/05/23) Goal status: Ongoing  5.  Pt will be able to ascend/descend stairs in house without difficulty or pain.  Baseline:  Goal status: Ongoing  6.  Pt will report Lt hip pain no greater than 2/10.  Baseline:  Goal status: Ongoing (reports pan of 4/10 on 10/26/23)  7.  Pt will decrease TUG time by 5 seconds in order to reduce fall risk.  Baseline:  Goal status: Met on 09/20/2023   PLAN:  PT FREQUENCY: 1-2x/week  PT DURATION: 12 weeks  PLANNED INTERVENTIONS: 97164- PT Re-evaluation, 97110-Therapeutic exercises, 97530- Therapeutic activity, 97112- Neuromuscular re-education, 97535- Self Care, 16109- Manual therapy, [id number]- Gait training, [phone]- Canalith repositioning, [id number]- Aquatic Therapy, U9811- Electrical stimulation (unattended), [id number]- Electrical stimulation (manual), [id number]- Vasopneumatic device, [id number]- Ultrasound, [id number]-  Traction (mechanical), 54098- Ionotophoresis 4mg /ml Dexamethasone , 11914 (1-2 muscles), 20561 (3+ muscles)- Dry Needling, Patient/Family education, Balance training, Stair training, Taping, Joint mobilization, Joint manipulation, Spinal manipulation, Spinal mobilization, Vestibular training, Cryotherapy, and Moist  heat  PLAN FOR NEXT SESSION: Assess and progress HEP to include balance/standing, strengthening, flexibility, manual/dry needling as indicated, vestibular and canalith repositioning as indicated.    Robyne Christen, PT, DPT 11/11/23, 10:58 AM  [facility] [address], [address] [place], [place] [address] Phone # [phone] Fax [phone]

## 2023-11-16 ENCOUNTER — Ambulatory Visit

## 2023-11-16 DIAGNOSIS — R42 Dizziness and giddiness: Secondary | ICD-10-CM | POA: Diagnosis not present

## 2023-11-16 DIAGNOSIS — M6281 Muscle weakness (generalized): Secondary | ICD-10-CM | POA: Diagnosis not present

## 2023-11-16 DIAGNOSIS — M62838 Other muscle spasm: Secondary | ICD-10-CM

## 2023-11-16 DIAGNOSIS — M25552 Pain in left hip: Secondary | ICD-10-CM | POA: Diagnosis not present

## 2023-11-16 DIAGNOSIS — R2681 Unsteadiness on feet: Secondary | ICD-10-CM | POA: Diagnosis not present

## 2023-11-16 NOTE — Therapy (Signed)
 OUTPATIENT PHYSICAL THERAPY TREATMENT NOTE AND REASSESSMENT NOTE   Patient Name: Glenda Bauer MRN: [id number] DOB:03-Jan-1951, 73 y.o., female Today's Date: 11/16/2023   END OF SESSION:  PT End of Session - 11/16/23 1150     Visit Number 15    Date for PT Re-Evaluation 01/06/24    Authorization Type [employer]    Authorization - Visit Number 4    Authorization - Number of Visits 8    Progress Note Due on Visit 20    PT Start Time 1103    PT Stop Time 1142    PT Time Calculation (min) 39 min    Activity Tolerance Patient tolerated treatment well    Behavior During Therapy WFL for tasks assessed/performed               Past Medical History:  Diagnosis Date   Allergy    Anxiety    Asthma    Atrial fibrillation (HCC)    Cancer (HCC)    Melanoma and Mycosis Fungosis   Cataract    Cutaneous T-cell lymphoma (HCC) 2011   Dermatologist treated in [place] ; Dr. Mervyn Ace   Depression    GERD (gastroesophageal reflux disease)    Heart murmur    Hyperlipidemia    Hypertension    Neuromuscular disorder (HCC)    Osteoarthritis    Sebaceous cyst 2022   right vulva   Thyroid  nodule    Urinary tract infection    Past Surgical History:  Procedure Laterality Date   ATRIAL FIBRILLATION ABLATION N/A 05/12/2022   Procedure: ATRIAL FIBRILLATION ABLATION;  Surgeon: Lei Pump, MD;  Location: [facility];  Service: Cardiovascular;  Laterality: N/A;   Cataracts Bilateral    CHOLECYSTECTOMY  2015   melenoma removal  1979   Sinus Polyps      Patient Active Problem List   Diagnosis Date Noted   Dilation of aorta (HCC) 08/11/2023   Statin intolerance 08/11/2023   Coronary artery disease involving native coronary artery of native heart without angina pectoris 08/11/2023   Hyperlipidemia 01/05/2022   OSA (obstructive sleep apnea) 07/28/2021   Thoracic aortic aneurysm without rupture (HCC) 01/08/2021   Sebaceous cyst 2022   Paroxysmal atrial  fibrillation (HCC) 04/03/2020   Hypercoagulable state due to paroxysmal atrial fibrillation (HCC) 04/03/2020   Thyroid  nodule    Osteoarthritis    Cyst of knee joint 02/13/2016   Sinusitis 10/30/2015   Asthma with exacerbation 10/30/2015   Sciatica 03/06/2015   Abdominal pain, right upper quadrant 02/12/2015   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   PERTINENT HISTORY: A-fib, abdominal surgery (cholecystectomy), anxiety, asthma, hx melanoma, depression  PAIN: 11/16/23 Are you having pain? Yes: NPRS scale: 3-4/10  Pain location: Right hip and leg Pain description: sore Aggravating factors: worst at night, has to change positions; stairs Relieving factors: rest  PRECAUTIONS: Fall  RED FLAGS: None   WEIGHT BEARING RESTRICTIONS: No  FALLS:  Has patient fallen in  last 6 months? Yes. Number of falls At least 5 times.  Most recent fall in early March when she was walking the standard size poodle (pt reports that she hit her head during the fall), other falls have occurred when walking the dog and it can pull her off balance.  LIVING ENVIRONMENT: Lives with: lives with their family Lives in: House/apartment Stairs: Yes: Internal: up to bedroom steps; on right going up Has following equipment at home: None  OCCUPATION: retired  PLOF: Independent  PATIENT GOALS: get back to walking the  dog 5 miles a day  NEXT MD VISIT: none scheduled - 6 months  OBJECTIVE:  Note: Objective measures were completed at Evaluation unless otherwise noted. 08/24/23: DIAGNOSTIC FINDINGS: none for this issue   PATIENT SURVEYS:  Eval:  LEFS: 47.5  09/29/2023:  Lower Extremity Functional Score: 60 / 80 = 75.0 %  10/26/2023: Lower Extremity Functional Score: 44 / 80 = 55.0 % Total ABC score: 1010 / 1600 = 63.1 %  COGNITION: Overall cognitive status: Within functional limits for tasks assessed     SENSATION: WFL  POSTURE: rounded shoulders, forward head, decreased lumbar lordosis, increased thoracic kyphosis, and posterior pelvic tilt  PALPATION: Significant tenderness throughout Lt glutes, tensor fascia latte, and lateral thigh  LOWER EXTREMITY MMT:  MMT Right eval Left eval 10/26/23  Hip flexion 4 3 3+  Hip extension 3 2 3   Hip abduction 4 2 3   Hip adduction 3 3 3+   (Blank rows = not tested)    FUNCTIONAL TESTS:   Eval: Squat: limited descent, unsteady, had to hold table Single leg stance:  Rt: pelvic drop, unable without UE support  Lt: pelvic drop, unable without UE support 5x sit<>stand: 17 seconds  Timed up and go (TUG): 16 seconds  09/05/23:  5x sit to stand: 13.05 without hands   09/20/2023: Timed up and go (TUG): 7.73 sec 5 times sit to/from stand:  10.34 sec Single leg stance:  right - 6.20 sec, left- 4.30 sec  10/26/2023: 5 times sit to/from stand:  8.38 sec Single leg stance:  right - 6.28 sec, left- 4.35 sec  11/11/2023: Timed up and go (TUG): 8.65 sec 5 times sit to/from stand: 11.67 sec 3 minute walk test:  460 ft with antalgic gait noted   GAIT:  Comments: Unsteady, antalgic Lt LE, decreased bil hip extnesion  VESTIBULAR ASSESSMENT: 08/29/2023: Smooth pursuit:  nystagmus noted with horizontal pursuit, especially towards the right side Gaze stability:  WNL Saccades are WNL Head Thrust:  positive for nystagmus to the right Dix Hallpike:  slight positive on the right, but more positive for right upbeating nystagmus on the left  08/28/2023: Etta Heritage positive on the left  11/11/2023: Etta Heritage positive on the left  TODAY'S TREATMENT  DATE: 11/16/2023 Nustep level 5 x6 min with PT present to discuss status Tandem gait on AirEx beam in parallel bars down and back x2 laps Side stepping on AirEx beam in parallel bars down and back x2 laps FWD step over 4 small hurdles in parallel bars down and back x1 lap, lateral step over Standing on balance pad: hip abduction and extension 2x10 bil  Sit to stand with 5# kettle bell: parallel and staggered stance  alternating step tap on 6 step 2x10-no UE support    DATE: 11/11/2023 Nustep level 5 x6 min with PT present to discuss status 5 times sit to stand (with pain) and TUG 3 minute walk test:  460 ft with antalgic gait noted [employer] positive on the left.  Proceeded with canalith repositioning with Epley Maneuver x2 Tandem gait on AirEx beam in parallel bars down and back x2 laps Side stepping on AirEx beam in parallel bars down and back x2 laps FWD step over 4 small hurdles in parallel bars down and back x1 lap Manual Therapy:  instrument assisted soft tissue mobilization with Addaday to promote tissue elongation and elasticity to decrease tightness and spasms.   DATE: 10/28/2023 Nustep level 5 x6 min with PT present to discuss status Sit to/from stand holding 5# dumbbell:  x10 with chest press, x10 with overhead press Farmer's carry with 5# kettlebell performing high marching.  X10 bilat Dix Hallpike positive on the left.  Proceeded with canalith repositioning with Epley Maneuver x2 Tandem stance on AirEx beam in parallel bars down and back x3 laps Side stepping on AirEx beam in parallel bars down and back x3 laps Single leg stance on foam pod  performing 3 way clock tap x10 bilat Seated hamstring stretch 2x20 sec bilat    PATIENT EDUCATION:  Education details: See above Person educated: Patient Education method: Explanation, Demonstration, Tactile cues, Verbal cues, and Handouts Education comprehension: verbalized understanding  HOME EXERCISE PROGRAM: Access Code: [email or web address] URL: [email or web address] Date: 10/26/2023 Prepared by: Chaneta Comer Menke  Exercises - Seated Hip Adduction Isometrics with Celeste Cola  - 1 x daily - 7 x weekly - 2 sets - 10 reps - 5 hold - Seated Hip Abduction with Resistance  - 1 x daily - 7 x weekly - 2 sets - 10 reps - 5 hold - Wide Lower Trunk Rotation  - 1 x daily - 7 x weekly - 2 sets - 10 reps - Seated Horizontal Smooth Pursuit  - 1 x daily - 7 x weekly - 2 sets - 10 reps - Seated Gaze Stabilization with Head Rotation  - 1 x daily - 7 x weekly - 2 sets - 30 reps - Pencil Pushups  - 1 x daily - 7 x weekly - 2 sets - 10 reps - Brandt-Daroff Vestibular Exercise  - 1 x daily - 7 x weekly - 3-5 reps - Seated Hamstring Stretch  - 1 x daily - 7 x weekly - 2 reps - 20 sec hold - Seated Piriformis Stretch  - 1 x daily - 7 x weekly - 2 reps - 20 sec hold - Standing March with Counter Support  - 1 x daily - 7 x weekly - 2 sets - 10 reps - Standing Hip Abduction with Counter Support  - 1 x daily - 7 x weekly - 2 sets - 10 reps - Standing Hip Extension with Counter Support  - 1 x daily - 7 x weekly - 2 sets - 10 reps -  Single Leg Stance with Support  - 1 x daily - 7 x weekly - 2 reps - 20 sec hold - Tandem Stance with Support  - 1 x daily - 7 x weekly - 2 reps - 20 sec hold  ASSESSMENT:  CLINICAL IMPRESSION: Pt reports that her dizziness is improved after last session.  Pt continues to work on her strength, flexibility and balance.  She is challenged by balance exercises and PT monitored throughout session for safety and technique.  Patient will benefit from skilled PT to address the below  impairments and improve overall function.    OBJECTIVE IMPAIRMENTS: Abnormal gait, decreased activity tolerance, decreased coordination, decreased endurance, decreased mobility, difficulty walking, decreased ROM, decreased strength, increased fascial restrictions, increased muscle spasms, impaired tone, postural dysfunction, and pain.   ACTIVITY LIMITATIONS: squatting, sleeping, stairs, bed mobility, and locomotion level  PARTICIPATION LIMITATIONS: cleaning, laundry, driving, shopping, and community activity  PERSONAL FACTORS: 3+ comorbidities: medical history are also affecting patient's functional outcome.   REHAB POTENTIAL: Good  CLINICAL DECISION MAKING: Evolving/moderate complexity  EVALUATION COMPLEXITY: Moderate   GOALS: Goals reviewed with patient? Yes  SHORT TERM GOALS: Target date: 09/21/2023  Pt will be independent with HEP.   Baseline: Goal status: Met on 08/31/23  2.  Pt will undergo vestibular evaluation.  Baseline:  Goal status: Met on 08/29/23  3.  Pt will report 25% improvement in Lt hip pain.  Baseline:  Goal status: Met on 09/20/23   LONG TERM GOALS: Target date: 01/06/2024  Pt will be independent with advanced HEP.   Baseline:  Goal status: Ongoing  2.  Pt will report 0 falls in the two weeks prior to discharge.  Baseline:  Goal status: Ongoing (2 falls reported in May)  3.  Pt will report 75% improvement in dizziness.  Baseline:  Goal status: Met on 09/14/2023  4.  Pt will return to 5 mile walks with dog without unsteadiness or loss of balance.  Baseline: 2-3 miles over the day (09/05/23) Goal status: Ongoing  5.  Pt will be able to ascend/descend stairs in house without difficulty or pain.  Baseline:  Goal status: Ongoing  6.  Pt will report Lt hip pain no greater than 2/10.  Baseline:  Goal status: Ongoing (reports pan of 4/10 on 10/26/23)  7.  Pt will decrease TUG time by 5 seconds in order to reduce fall risk.  Baseline:  Goal status:  Met on 09/20/2023   PLAN:  PT FREQUENCY: 1-2x/week  PT DURATION: 12 weeks  PLANNED INTERVENTIONS: 97164- PT Re-evaluation, 97110-Therapeutic exercises, 97530- Therapeutic activity, 97112- Neuromuscular re-education, 97535- Self Care, 16109- Manual therapy, [id number]- Gait training, [phone]- Canalith repositioning, [id number]- Aquatic Therapy, [phone]- Electrical stimulation (unattended), [phone]- Electrical stimulation (manual), [id number]- Vasopneumatic device, [id number]- Ultrasound, [id number]- Traction (mechanical), [id number]- Ionotophoresis 4mg /ml Dexamethasone , [id number] (1-2 muscles), [id number] (3+ muscles)- Dry Needling, Patient/Family education, Balance training, Stair training, Taping, Joint mobilization, Joint manipulation, Spinal manipulation, Spinal mobilization, Vestibular training, Cryotherapy, and Moist heat  PLAN FOR NEXT SESSION: Assess and progress HEP to include balance/standing, strengthening, flexibility, manual/dry needling as indicated, vestibular and canalith repositioning as indicated.    Luella Sager, PT 11/16/23 11:51 AM   [facility] [address] [place], [place] [address] Phone # [phone] Fax [phone]

## 2023-11-18 ENCOUNTER — Encounter: Payer: Self-pay | Admitting: Rehabilitative and Restorative Service Providers"

## 2023-11-18 ENCOUNTER — Ambulatory Visit: Admitting: Rehabilitative and Restorative Service Providers"

## 2023-11-18 DIAGNOSIS — R2681 Unsteadiness on feet: Secondary | ICD-10-CM

## 2023-11-18 DIAGNOSIS — R42 Dizziness and giddiness: Secondary | ICD-10-CM

## 2023-11-18 DIAGNOSIS — M6281 Muscle weakness (generalized): Secondary | ICD-10-CM | POA: Diagnosis not present

## 2023-11-18 DIAGNOSIS — M62838 Other muscle spasm: Secondary | ICD-10-CM | POA: Diagnosis not present

## 2023-11-18 DIAGNOSIS — M25552 Pain in left hip: Secondary | ICD-10-CM | POA: Diagnosis not present

## 2023-11-18 NOTE — Therapy (Signed)
 OUTPATIENT PHYSICAL THERAPY TREATMENT NOTE    Patient Name: Glenda Bauer MRN: [id number] DOB:[birth year], 73 y.o., female Today's Date: 11/18/2023   END OF SESSION:  PT End of Session - 11/18/23 1111     Visit Number 16    Date for PT Re-Evaluation 01/06/24    Authorization Type [employer] Medicare    Authorization Time Period 10/26/23 - 12/21/23    Authorization - Visit Number 5    Authorization - Number of Visits 8    Progress Note Due on Visit 20    PT Start Time 1104    PT Stop Time 1145    PT Time Calculation (min) 41 min    Activity Tolerance Patient tolerated treatment well    Behavior During Therapy WFL for tasks assessed/performed            Past Medical History:  Diagnosis Date   Allergy    Anxiety    Asthma    Atrial fibrillation (HCC)    Cancer (HCC)    Melanoma and Mycosis Fungosis   Cataract    Cutaneous T-cell lymphoma (HCC) 2011   Dermatologist treated in [place] ; Dr. Mervyn Ace   Depression    GERD (gastroesophageal reflux disease)    Heart murmur    Hyperlipidemia    Hypertension    Neuromuscular disorder (HCC)    Osteoarthritis    Sebaceous cyst 2022   right vulva   Thyroid  nodule    Urinary tract infection    Past Surgical History:  Procedure Laterality Date   ATRIAL FIBRILLATION ABLATION N/A 05/12/2022   Procedure: ATRIAL FIBRILLATION ABLATION;  Surgeon: Lei Pump, MD;  Location: [facility];  Service: Cardiovascular;  Laterality: N/A;   Cataracts Bilateral    CHOLECYSTECTOMY  2015   melenoma removal  1979   Sinus Polyps      Patient Active Problem List   Diagnosis Date Noted   Dilation of aorta (HCC) 08/11/2023   Statin intolerance 08/11/2023   Coronary artery disease involving native coronary artery of native heart without angina pectoris 08/11/2023   Hyperlipidemia 01/05/2022   OSA (obstructive sleep apnea) 07/28/2021   Thoracic aortic aneurysm without rupture (HCC) 01/08/2021   Sebaceous cyst 2022    Paroxysmal atrial fibrillation (HCC) 04/03/2020   Hypercoagulable state due to paroxysmal atrial fibrillation (HCC) 04/03/2020   Thyroid  nodule    Osteoarthritis    Cyst of knee joint 02/13/2016   Sinusitis 10/30/2015   Asthma with exacerbation 10/30/2015   Sciatica 03/06/2015   Abdominal pain, right upper quadrant 02/12/2015   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  PERTINENT HISTORY: A-fib, abdominal surgery (cholecystectomy), anxiety, asthma, hx melanoma, depression  PAIN:  Are you having pain? Yes: NPRS scale: 3/10  Pain location: Left hip and leg Pain description: sore Aggravating factors: worst at night, has to change positions; stairs Relieving factors: rest  PRECAUTIONS: Fall  RED FLAGS:  None   WEIGHT BEARING RESTRICTIONS: No  FALLS:  Has patient fallen in last 6 months? Yes. Number of falls At least 5 times.  Most recent fall in early March when she was walking the standard size poodle (pt reports that she hit her head during the fall), other falls have occurred when walking the dog and it can pull her off balance.  LIVING ENVIRONMENT: Lives with: lives with their family Lives in: House/apartment Stairs: Yes: Internal: up to bedroom steps; on right going up Has following equipment at home: None  OCCUPATION:  retired  PLOF: Independent  PATIENT GOALS: get back to walking the dog 5 miles a day  NEXT MD VISIT: none scheduled - 6 months  OBJECTIVE:  Note: Objective measures were completed at Evaluation unless otherwise noted. 08/24/23: DIAGNOSTIC FINDINGS: none for this issue   PATIENT SURVEYS:  Eval:  LEFS: 47.5  09/29/2023:  Lower Extremity Functional Score: 60 / 80 = 75.0 %  10/26/2023: Lower Extremity Functional Score: 44 / 80 = 55.0 % Total ABC score: 1010 / 1600 = 63.1 %  COGNITION: Overall cognitive status: Within functional limits for tasks assessed     SENSATION: WFL  POSTURE: rounded shoulders, forward head, decreased lumbar lordosis, increased thoracic kyphosis, and posterior pelvic tilt  PALPATION: Significant tenderness throughout Lt glutes, tensor fascia latte, and lateral thigh  LOWER EXTREMITY MMT:  MMT Right eval Left eval 10/26/23  Hip flexion 4 3 3+  Hip extension 3 2 3   Hip abduction 4 2 3   Hip adduction 3 3 3+   (Blank rows = not tested)    FUNCTIONAL TESTS:   Eval: Squat: limited descent, unsteady, had to hold table Single leg stance:  Rt: pelvic drop, unable without UE support  Lt: pelvic drop, unable without UE support 5x sit<>stand: 17 seconds  Timed up and go (TUG): 16 seconds  09/05/23:  5x sit to stand: 13.05 without hands   09/20/2023: Timed up and go (TUG): 7.73 sec 5 times sit to/from stand:  10.34 sec Single leg stance:  right - 6.20 sec, left- 4.30 sec  10/26/2023: 5 times sit to/from stand:  8.38 sec Single leg stance:  right - 6.28 sec, left- 4.35 sec  11/11/2023: Timed up and go (TUG): 8.65 sec 5 times sit to/from stand: 11.67 sec 3 minute walk test:  460 ft with antalgic gait noted   GAIT:  Comments: Unsteady, antalgic Lt LE, decreased bil hip extnesion  VESTIBULAR ASSESSMENT: 08/29/2023: Smooth pursuit:  nystagmus noted with horizontal pursuit, especially towards the right side Gaze stability:  WNL Saccades are  WNL Head Thrust:  positive for nystagmus to the right Dix Hallpike: slight positive on the right, but more positive for right upbeating nystagmus on the left  08/28/2023: Etta Heritage positive on the left  11/11/2023: Etta Heritage positive on the left  TODAY'S TREATMENT  DATE: 11/18/2023 Nustep level 5 x6 min with PT present to discuss status Tandem gait on AirEx beam in parallel bars down and back x3 laps Side stepping on AirEx beam in parallel bars down and back x3 laps FWD step ups onto bosu x10 bilat Side step up and over bosu and back x10 bilat Retrogait on treadmill -0.6 mph x3 min Marching on mini-trampoline x1 min with UE support Standing on trampoline performing ball toss with green plyoball x20 Seated on blue physioball:  marching and LAQ 2x10   DATE: 11/16/2023 Nustep level 5 x6 min with PT present to discuss status Tandem gait on AirEx beam in parallel bars down and back x2 laps Side stepping on AirEx beam in parallel bars down and back x2 laps FWD step over 4 small hurdles in parallel bars down and back x1 lap, lateral step over Standing on balance pad: hip abduction and extension 2x10 bil  Sit to stand with 5# kettle bell: parallel and staggered stance  alternating step tap on 6 step 2x10-no UE support    DATE: 11/11/2023 Nustep level 5 x6 min with PT present to discuss status 5 times sit to stand (with pain) and TUG 3 minute walk test:  460 ft with antalgic gait noted [employer] positive on the left.  Proceeded with canalith repositioning with Epley Maneuver x2 Tandem gait on AirEx beam in parallel bars down and back x2 laps Side stepping on AirEx beam in parallel bars down and back x2 laps FWD step over 4 small hurdles in parallel bars down and back x1 lap Manual Therapy:  instrument assisted soft tissue mobilization with Addaday to  promote tissue elongation and elasticity to decrease tightness and spasms.   PATIENT EDUCATION:  Education details: See above Person educated: Patient Education method: Explanation, Demonstration, Tactile cues, Verbal cues, and Handouts Education comprehension: verbalized understanding  HOME EXERCISE PROGRAM: Access Code: [email or web address] URL: [email or web address] Date: 10/26/2023 Prepared by: Chaneta Comer Asma Boldon  Exercises - Seated Hip Adduction Isometrics with Celeste Cola  - 1 x daily - 7 x weekly - 2 sets - 10 reps - 5 hold - Seated Hip Abduction with Resistance  - 1 x daily - 7 x weekly - 2 sets - 10 reps - 5 hold - Wide Lower Trunk Rotation  - 1 x daily - 7 x weekly - 2 sets - 10 reps - Seated Horizontal Smooth Pursuit  - 1 x daily - 7 x weekly - 2 sets - 10 reps - Seated Gaze Stabilization with Head Rotation  - 1 x daily - 7 x weekly - 2 sets - 30 reps - Pencil Pushups  - 1 x daily - 7 x weekly - 2 sets - 10 reps - Brandt-Daroff Vestibular Exercise  - 1 x daily - 7 x weekly - 3-5 reps - Seated Hamstring Stretch  - 1 x daily - 7 x weekly - 2 reps - 20 sec hold - Seated Piriformis Stretch  - 1 x daily - 7 x weekly - 2 reps - 20 sec hold - Standing March with Counter Support  - 1 x daily - 7 x weekly - 2 sets - 10 reps - Standing Hip Abduction with Counter Support  - 1 x daily - 7 x weekly - 2 sets - 10 reps - Standing Hip Extension with Counter Support  - 1 x daily - 7 x weekly - 2 sets - 10 reps - Single Leg Stance with Support  - 1 x daily -  7 x weekly - 2 reps - 20 sec hold - Tandem Stance with Support  - 1 x daily - 7 x weekly - 2 reps - 20 sec hold  ASSESSMENT:  CLINICAL IMPRESSION: Ms Thumma presents to skilled PT reporting that her dizziness still seems to be improved.  Patient continues to be able to progress with balance activities today.  Performed retrogait on treadmill to assist with activation of glutes.  Patient with great response to treatment throughout.  She  requires minimal UE support for more advanced balance tasks, and only requires minimal cuing throughout for improved posture and body mechanics.  Patient continues to require skilled PT to progress towards goal related activities.   OBJECTIVE IMPAIRMENTS: Abnormal gait, decreased activity tolerance, decreased coordination, decreased endurance, decreased mobility, difficulty walking, decreased ROM, decreased strength, increased fascial restrictions, increased muscle spasms, impaired tone, postural dysfunction, and pain.   ACTIVITY LIMITATIONS: squatting, sleeping, stairs, bed mobility, and locomotion level  PARTICIPATION LIMITATIONS: cleaning, laundry, driving, shopping, and community activity  PERSONAL FACTORS: 3+ comorbidities: medical history are also affecting patient's functional outcome.   REHAB POTENTIAL: Good  CLINICAL DECISION MAKING: Evolving/moderate complexity  EVALUATION COMPLEXITY: Moderate   GOALS: Goals reviewed with patient? Yes  SHORT TERM GOALS: Target date: 09/21/2023  Pt will be independent with HEP.   Baseline: Goal status: Met on 08/31/23  2.  Pt will undergo vestibular evaluation.  Baseline:  Goal status: Met on 08/29/23  3.  Pt will report 25% improvement in Lt hip pain.  Baseline:  Goal status: Met on 09/20/23   LONG TERM GOALS: Target date: 01/06/2024  Pt will be independent with advanced HEP.   Baseline:  Goal status: Ongoing  2.  Pt will report 0 falls in the two weeks prior to discharge.  Baseline:  Goal status: Ongoing (2 falls reported in May)  3.  Pt will report 75% improvement in dizziness.  Baseline:  Goal status: Met on 09/14/2023  4.  Pt will return to 5 mile walks with dog without unsteadiness or loss of balance.  Baseline: 2-3 miles over the day (09/05/23) Goal status: Ongoing  5.  Pt will be able to ascend/descend stairs in house without difficulty or pain.  Baseline:  Goal status: Ongoing  6.  Pt will report Lt hip pain no  greater than 2/10.  Baseline:  Goal status: Ongoing (reports pan of 4/10 on 10/26/23)  7.  Pt will decrease TUG time by 5 seconds in order to reduce fall risk.  Baseline:  Goal status: Met on 09/20/2023   PLAN:  PT FREQUENCY: 1-2x/week  PT DURATION: 12 weeks  PLANNED INTERVENTIONS: 97164- PT Re-evaluation, 97110-Therapeutic exercises, 97530- Therapeutic activity, 97112- Neuromuscular re-education, 97535- Self Care, 62130- Manual therapy, [id number]- Gait training, [phone]- Canalith repositioning, [id number]- Aquatic Therapy, [phone]- Electrical stimulation (unattended), [phone]- Electrical stimulation (manual), [id number]- Vasopneumatic device, [id number]- Ultrasound, [id number]- Traction (mechanical), [id number]- Ionotophoresis 4mg /ml Dexamethasone , 13244 (1-2 muscles), 20561 (3+ muscles)- Dry Needling, Patient/Family education, Balance training, Stair training, Taping, Joint mobilization, Joint manipulation, Spinal manipulation, Spinal mobilization, Vestibular training, Cryotherapy, and Moist heat  PLAN FOR NEXT SESSION: Assess and progress HEP to include balance/standing, strengthening, flexibility, manual/dry needling as indicated, vestibular and canalith repositioning as indicated.    Robyne Christen, PT, DPT 11/18/23, 11:57 AM  [facility] [address], [address] [place], [place] [address] Phone # [phone] Fax [phone]

## 2023-11-21 ENCOUNTER — Other Ambulatory Visit: Payer: Self-pay | Admitting: Adult Health

## 2023-11-21 ENCOUNTER — Other Ambulatory Visit: Payer: Self-pay | Admitting: Physician Assistant

## 2023-11-21 DIAGNOSIS — F419 Anxiety disorder, unspecified: Secondary | ICD-10-CM

## 2023-11-25 ENCOUNTER — Other Ambulatory Visit: Payer: Self-pay | Admitting: Pharmacist

## 2023-11-25 DIAGNOSIS — I2510 Atherosclerotic heart disease of native coronary artery without angina pectoris: Secondary | ICD-10-CM

## 2023-11-25 DIAGNOSIS — E785 Hyperlipidemia, unspecified: Secondary | ICD-10-CM

## 2023-11-25 MED ORDER — REPATHA SURECLICK 140 MG/ML ~~LOC~~ SOAJ: 140.0000 mg | SUBCUTANEOUS | 11 refills | Status: AC

## 2023-12-08 ENCOUNTER — Telehealth: Payer: Self-pay | Admitting: Physician Assistant

## 2023-12-08 MED ORDER — EZETIMIBE 10 MG PO TABS: 10.0000 mg | ORAL_TABLET | Freq: Every day | ORAL | 2 refills | Status: AC

## 2023-12-08 NOTE — Telephone Encounter (Signed)
 Pt's medication was sent to pt's pharmacy as requested. Confirmation received.

## 2023-12-08 NOTE — Telephone Encounter (Signed)
*  STAT* If patient is at the pharmacy, call can be transferred to refill team.   1. Which medications need to be refilled? (please list name of each medication and dose if known) ezetimibe  (ZETIA ) 10 MG tablet   2. Which pharmacy/location (including street and city if local pharmacy) is medication to be sent to? [facility] - [address] [place] - [address]    3. Do they need a 30 day or 90 day supply? 30

## 2023-12-21 ENCOUNTER — Other Ambulatory Visit (HOSPITAL_BASED_OUTPATIENT_CLINIC_OR_DEPARTMENT_OTHER): Payer: Self-pay | Admitting: Cardiology

## 2023-12-21 DIAGNOSIS — I10 Essential (primary) hypertension: Secondary | ICD-10-CM

## 2024-01-19 ENCOUNTER — Other Ambulatory Visit: Payer: Self-pay | Admitting: Adult Health

## 2024-01-19 ENCOUNTER — Other Ambulatory Visit: Payer: Self-pay | Admitting: Physician Assistant

## 2024-01-19 DIAGNOSIS — F32A Depression, unspecified: Secondary | ICD-10-CM

## 2024-01-19 DIAGNOSIS — M797 Fibromyalgia: Secondary | ICD-10-CM

## 2024-02-08 ENCOUNTER — Telehealth: Payer: Self-pay | Admitting: Cardiology

## 2024-02-08 MED ORDER — METOPROLOL SUCCINATE ER 25 MG PO TB24: 25.0000 mg | ORAL_TABLET | Freq: Every day | ORAL | 1 refills | Status: AC

## 2024-02-08 NOTE — Telephone Encounter (Signed)
*  STAT* If patient is at the pharmacy, call can be transferred to refill team.   1. Which medications need to be refilled? (please list name of each medication and dose if known) metoprolol  succinate (TOPROL -XL) 25 MG 24 hr tablet    2. Would you like to learn more about the convenience, safety, & potential cost savings by using the [facility]? NO   3. Are you open to using the [facility] NO   4. Which pharmacy/location (including street and city if local pharmacy) is medication to be sent to? [facility] [place]  - [place], [place] - [address]    5. Do they need a 30 day or 90 day supply? 90

## 2024-02-08 NOTE — Telephone Encounter (Signed)
 RX sent in

## 2024-02-20 ENCOUNTER — Other Ambulatory Visit: Payer: Self-pay | Admitting: Adult Health

## 2024-03-06 ENCOUNTER — Telehealth: Payer: Self-pay | Admitting: Cardiology

## 2024-03-06 DIAGNOSIS — I10 Essential (primary) hypertension: Secondary | ICD-10-CM

## 2024-03-06 MED ORDER — LISINOPRIL-HYDROCHLOROTHIAZIDE 20-12.5 MG PO TABS: 2.0000 | ORAL_TABLET | Freq: Every day | ORAL | 1 refills | Status: DC

## 2024-03-06 NOTE — Telephone Encounter (Signed)
*  STAT* If patient is at the pharmacy, call can be transferred to refill team.   1. Which medications need to be refilled? (please list name of each medication and dose if known)  lisinopril -hydrochlorothiazide  (ZESTORETIC ) 20-12.5 MG tablet    2. Which pharmacy/location (including street and city if local pharmacy) is medication to be sent to? [facility] - [address] [place] - [address]   3. Do they need a 30 day or 90 day supply?  90 day supply

## 2024-03-06 NOTE — Telephone Encounter (Signed)
 Pt's medication was sent to pt's pharmacy as requested. Confirmation received.

## 2024-03-20 ENCOUNTER — Other Ambulatory Visit: Payer: Self-pay | Admitting: Adult Health

## 2024-03-20 ENCOUNTER — Other Ambulatory Visit: Payer: Self-pay | Admitting: Cardiology

## 2024-03-20 DIAGNOSIS — I4891 Unspecified atrial fibrillation: Secondary | ICD-10-CM

## 2024-03-20 DIAGNOSIS — I10 Essential (primary) hypertension: Secondary | ICD-10-CM

## 2024-03-20 DIAGNOSIS — J4540 Moderate persistent asthma, uncomplicated: Secondary | ICD-10-CM

## 2024-03-20 DIAGNOSIS — J4541 Moderate persistent asthma with (acute) exacerbation: Secondary | ICD-10-CM

## 2024-03-20 DIAGNOSIS — Z0000 Encounter for general adult medical examination without abnormal findings: Secondary | ICD-10-CM

## 2024-03-21 NOTE — Telephone Encounter (Signed)
 Patient need to schedule CPE for more refills.

## 2024-03-21 NOTE — Telephone Encounter (Signed)
 Prescription refill request for Xarelto  received.  Indication:afib Last office visit:3/25 Weight:79.4  kg Age:73 Scr:0.94  4/25 CrCl:64.82  ml/min  Prescription refilled

## 2024-04-16 ENCOUNTER — Encounter (HOSPITAL_BASED_OUTPATIENT_CLINIC_OR_DEPARTMENT_OTHER): Payer: Self-pay | Admitting: Cardiology

## 2024-04-16 ENCOUNTER — Ambulatory Visit (HOSPITAL_BASED_OUTPATIENT_CLINIC_OR_DEPARTMENT_OTHER): Admitting: Cardiology

## 2024-04-16 VITALS — BP 128/72 | HR 58

## 2024-04-16 DIAGNOSIS — T466X5D Adverse effect of antihyperlipidemic and antiarteriosclerotic drugs, subsequent encounter: Secondary | ICD-10-CM

## 2024-04-16 DIAGNOSIS — I2510 Atherosclerotic heart disease of native coronary artery without angina pectoris: Secondary | ICD-10-CM

## 2024-04-16 DIAGNOSIS — D6869 Other thrombophilia: Secondary | ICD-10-CM

## 2024-04-16 DIAGNOSIS — I480 Paroxysmal atrial fibrillation: Secondary | ICD-10-CM

## 2024-04-16 DIAGNOSIS — M791 Myalgia, unspecified site: Secondary | ICD-10-CM | POA: Diagnosis not present

## 2024-04-16 DIAGNOSIS — Z7901 Long term (current) use of anticoagulants: Secondary | ICD-10-CM

## 2024-04-16 DIAGNOSIS — E7800 Pure hypercholesterolemia, unspecified: Secondary | ICD-10-CM

## 2024-04-16 DIAGNOSIS — I10 Essential (primary) hypertension: Secondary | ICD-10-CM

## 2024-04-16 DIAGNOSIS — T466X5A Adverse effect of antihyperlipidemic and antiarteriosclerotic drugs, initial encounter: Secondary | ICD-10-CM

## 2024-04-16 NOTE — Progress Notes (Signed)
 Cardiology Office Note:  .   Date:  04/16/2024  ID:  Glenda Bauer, DOB [birth year], MRN [id number] PCP: Merna Huxley, NP  [facility] Providers Cardiologist:  Shelda Bruckner, MD Electrophysiologist:  Will Gladis Norton, MD {  History of Present Illness: .   Glenda Bauer is a 73 y.o. female with PMH hypertension, ascending aortic aneurysm, paroxysmal atrial fibrillation, cutaneous T cell lymphoma. She was previously followed by Dr. Hobart and established care with me on 08/11/23.  Pertinent CV history: Diagnosed with afib in 2021. Has been maintained on flecainide  and then underwent ablation 05/12/22. Cardiac CTA 2023 with concern for severe plaque/stenosis in mLAD, but FFR negative.   Today: Has heat intolerance, wants to discuss medication. Also notes skin sensitivity with the anticoagulant. Still has shortness of breath on exertion.  In August, she couldn't even go outside as she felt terrible in the heat. This has been gradually progressing over the last year. Even now she doesn't sleep with blankets and keeps her house cooler. TSH has been normal.   Walks her dog 3 times/day, feels short of breath after about 1/2 mile. This has been stable, she generally slows down and can keep walking, rarely has to stop. No chest pain or pressure with this. Uses her inhaler before walks. Has occasional wheezing but not routinely. No significant racing heartbeats/palpitations.   Has bruising from areas where she has been chafing on her legs. No major bleeding.   ROS: Denies chest pain, shortness of breath at rest or with normal exertion. No PND, orthopnea, LE edema or unexpected weight gain. No syncope or palpitations. ROS otherwise negative except as noted.   Studies Reviewed: SABRA    EKG:       Physical Exam:   VS:  BP 128/72   Pulse (!) 58   LMP 06/07/2000 (Approximate)   SpO2 96%    Wt Readings from Last 3 Encounters:  10/19/23 175 lb (79.4 kg)  08/19/23 175  lb (79.4 kg)  08/11/23 178 lb 14.4 oz (81.1 kg)    GEN: Well nourished, well developed in no acute distress HEENT: Normal, moist mucous membranes NECK: No JVD CARDIAC: regular rhythm, normal S1 and S2, no rubs or gallops. 1/6 systolic murmur. VASCULAR: Radial and DP pulses 2+ bilaterally. No carotid bruits RESPIRATORY:  Clear to auscultation without rales, wheezing or rhonchi  ABDOMEN: Soft, non-tender, non-distended MUSCULOSKELETAL:  Ambulates independently SKIN: Warm and dry, no edema NEUROLOGIC:  Alert and oriented x 3. No focal neuro deficits noted. PSYCHIATRIC:  Normal affect    ASSESSMENT AND PLAN: .    CAD Hypercholesterolemia Statin intolerance due to myalgia -did not tolerate statin, tried on simvastatin , rosuvastatin , atorvastatin  -no chest pain, walks routinely -on repatha  and ezetimibe  -last lipids 12/2022 at goal, reviewed. LDL 50. Will recheck today  Paroxysmal atrial fibrillation Secondary hypercoagulable state -no symptoms -s/p ablation -CHA2DS2/VAS Stroke Risk Points=4  -continue Xarelto  for secondary hypercoagulable state, no major bleeding issues. Does bruise easily -we discussed options today, including remaining on DOAC, discussed Watchman. We did discuss that there aren't recommendations for stopping AC with higher chadsvasc score, even with using something like a loop recorder, but she would be interested in discussing in the future if this has clearer guidance. She will continue rivaroxaban  for now.  Hypertension -at goal -lisinopril  40-hydrochlorothiazide  25 mg daily (2 pills instead of one, higher dose not available as single pill) -monitor symptoms and blood pressure. She will contact me if BP not consistently <130/80  Mild to  moderate ascending aortic dilation -stable, followed by Dr. Lucas -reviewed BP management and changes, as above -reviewed exercise recommendations  Mild to moderate MR -manage BP as above.  CV risk counseling and  prevention -recommend heart healthy/Mediterranean diet, with whole grains, fruits, vegetable, fish, lean meats, nuts, and olive oil. Limit salt. -recommend moderate walking, 3-5 times/week for 30-50 minutes each session. Aim for at least 150 minutes.week. Goal should be pace of 3 miles/hours, or walking 1.5 miles in 30 minutes -recommend avoidance of tobacco products. Avoid excess alcohol.  Dispo: 6 mos  Signed, Shelda Bruckner, MD   Shelda Bruckner, MD, PhD, [facility]  [facility]  [place]  [facility] at [facility] [address], [address] [place], [place] [address] [phone]

## 2024-04-16 NOTE — Patient Instructions (Signed)
 Medication Instructions:  No changes *If you need a refill on your cardiac medications before your next appointment, please call your pharmacy*  Lab Work: Today: lipid panel   If you have labs (blood work) drawn today and your tests are completely normal, you will receive your results only by: MyChart Message (if you have MyChart) OR A paper copy in the mail If you have any lab test that is abnormal or we need to change your treatment, we will call you to review the results.  Testing/Procedures: none  Follow-Up: At [facility], you and your health needs are our priority.  As part of our continuing mission to provide you with exceptional heart care, our providers are all part of one team.  This team includes your primary Cardiologist (physician) and Advanced Practice Providers or APPs (Physician Assistants and Nurse Practitioners) who all work together to provide you with the care you need, when you need it.  Your next appointment:   6 month(s)  Provider:   Shelda Bruckner, MD, Rosaline Bane, NP, or Reche Finder, NP

## 2024-04-17 LAB — LIPID PANEL
Chol/HDL Ratio: 2.1 ratio (ref 0.0–4.4)
Cholesterol, Total: 164 mg/dL (ref 100–199)
HDL: 77 mg/dL (ref >39)
LDL Chol Calc (NIH): 73 mg/dL (ref 0–99)
Triglycerides: 75 mg/dL (ref 0–149)
VLDL Cholesterol Cal: 14 mg/dL (ref 5–40)

## 2024-04-18 ENCOUNTER — Other Ambulatory Visit: Payer: Self-pay | Admitting: Adult Health

## 2024-04-18 DIAGNOSIS — F32A Depression, unspecified: Secondary | ICD-10-CM

## 2024-04-18 DIAGNOSIS — J4541 Moderate persistent asthma with (acute) exacerbation: Secondary | ICD-10-CM

## 2024-04-18 DIAGNOSIS — M797 Fibromyalgia: Secondary | ICD-10-CM

## 2024-04-19 NOTE — Telephone Encounter (Signed)
 Patient need to schedule CPE for more refills.

## 2024-04-24 ENCOUNTER — Other Ambulatory Visit: Payer: Self-pay | Admitting: Cardiology

## 2024-04-24 ENCOUNTER — Ambulatory Visit (HOSPITAL_BASED_OUTPATIENT_CLINIC_OR_DEPARTMENT_OTHER): Payer: Self-pay | Admitting: Family

## 2024-04-24 DIAGNOSIS — I10 Essential (primary) hypertension: Secondary | ICD-10-CM

## 2024-05-22 ENCOUNTER — Other Ambulatory Visit: Payer: Self-pay | Admitting: Adult Health

## 2024-05-22 DIAGNOSIS — F419 Anxiety disorder, unspecified: Secondary | ICD-10-CM

## 2024-05-22 DIAGNOSIS — M797 Fibromyalgia: Secondary | ICD-10-CM

## 2024-05-22 DIAGNOSIS — J4541 Moderate persistent asthma with (acute) exacerbation: Secondary | ICD-10-CM

## 2024-05-22 NOTE — Telephone Encounter (Unsigned)
 Copied from CRM #[id number]. Topic: Clinical - Medication Refill >> May 22, 2024  2:04 PM Alfonso ORN wrote: Medication:  FLUoxetine  (PROZAC ) 40 MG capsule  and montelukast  (SINGULAIR ) 10 MG tablet  Has the patient contacted their pharmacy? Yes   This is the patient's preferred pharmacy:  [facility], [place] - [address] [place] [address] Phone: [phone] Fax: [phone]   Is this the correct pharmacy for this prescription? Yes If no, delete pharmacy and type the correct one.   Has the prescription been filled recently? Yes  Is the patient out of the medication? No  Has the patient been seen for an appointment in the last year OR does the patient have an upcoming appointment? Yes  Can we respond through MyChart? Yes  Agent: Please be advised that Rx refills may take up to 3 business days. We ask that you follow-up with your pharmacy.

## 2024-05-23 MED ORDER — FLUOXETINE HCL 40 MG PO CAPS: 40.0000 mg | ORAL_CAPSULE | Freq: Every day | ORAL | 0 refills | Status: AC

## 2024-05-23 MED ORDER — MONTELUKAST SODIUM 10 MG PO TABS: 10.0000 mg | ORAL_TABLET | Freq: Every day | ORAL | 0 refills | Status: DC

## 2024-06-13 ENCOUNTER — Other Ambulatory Visit: Payer: Self-pay | Admitting: Surgery

## 2024-06-13 DIAGNOSIS — I7121 Aneurysm of the ascending aorta, without rupture: Secondary | ICD-10-CM

## 2024-06-22 ENCOUNTER — Other Ambulatory Visit: Payer: Self-pay | Admitting: Adult Health

## 2024-06-22 DIAGNOSIS — J4541 Moderate persistent asthma with (acute) exacerbation: Secondary | ICD-10-CM

## 2024-06-26 NOTE — Telephone Encounter (Signed)
 Will fill for 3 month supply. No further refills until appt. Is scheduled

## 2024-06-26 NOTE — Telephone Encounter (Signed)
 Per previous note, pt needs an appt.

## 2024-07-04 ENCOUNTER — Ambulatory Visit
Admission: RE | Admit: 2024-07-04 | Discharge: 2024-07-04 | Disposition: A | Source: Ambulatory Visit | Attending: Surgery | Admitting: Surgery

## 2024-07-04 DIAGNOSIS — I7121 Aneurysm of the ascending aorta, without rupture: Secondary | ICD-10-CM

## 2024-07-04 MED ORDER — GADOPICLENOL 0.5 MMOL/ML IV SOLN
8.0000 mL | Freq: Once | INTRAVENOUS | Status: AC | PRN
  Administered 2024-07-04: 8 mL via INTRAVENOUS

## 2024-07-18 ENCOUNTER — Ambulatory Visit: Admitting: Surgery

## 2024-08-08 ENCOUNTER — Ambulatory Visit: Admitting: Surgery

## 2024-10-24 ENCOUNTER — Ambulatory Visit
